# Patient Record
Sex: Female | Born: 1965 | Race: White | Hispanic: No | Marital: Single | Smoking: Never smoker
Health system: Southern US, Community
[De-identification: ages and names within clinical notes are randomized; demographics above are authoritative.]

## PROBLEM LIST (undated history)

## (undated) DIAGNOSIS — G4700 Insomnia, unspecified: Secondary | ICD-10-CM

## (undated) DIAGNOSIS — R8789 Other abnormal findings in specimens from female genital organs: Secondary | ICD-10-CM

## (undated) DIAGNOSIS — E119 Type 2 diabetes mellitus without complications: Secondary | ICD-10-CM

## (undated) DIAGNOSIS — E282 Polycystic ovarian syndrome: Secondary | ICD-10-CM

## (undated) DIAGNOSIS — R51 Headache: Secondary | ICD-10-CM

## (undated) DIAGNOSIS — R519 Headache, unspecified: Secondary | ICD-10-CM

## (undated) DIAGNOSIS — F32A Depression, unspecified: Secondary | ICD-10-CM

## (undated) DIAGNOSIS — E079 Disorder of thyroid, unspecified: Secondary | ICD-10-CM

## (undated) DIAGNOSIS — F419 Anxiety disorder, unspecified: Secondary | ICD-10-CM

## (undated) DIAGNOSIS — K589 Irritable bowel syndrome without diarrhea: Secondary | ICD-10-CM

## (undated) DIAGNOSIS — K9049 Malabsorption due to intolerance, not elsewhere classified: Secondary | ICD-10-CM

## (undated) DIAGNOSIS — J302 Other seasonal allergic rhinitis: Secondary | ICD-10-CM

## (undated) DIAGNOSIS — F329 Major depressive disorder, single episode, unspecified: Secondary | ICD-10-CM

## (undated) DIAGNOSIS — G43909 Migraine, unspecified, not intractable, without status migrainosus: Secondary | ICD-10-CM

## (undated) DIAGNOSIS — I10 Essential (primary) hypertension: Secondary | ICD-10-CM

## (undated) DIAGNOSIS — F4310 Post-traumatic stress disorder, unspecified: Secondary | ICD-10-CM

## (undated) HISTORY — DX: Essential (primary) hypertension: I10

## (undated) HISTORY — DX: Irritable bowel syndrome, unspecified: K58.9

## (undated) HISTORY — DX: Insomnia, unspecified: G47.00

## (undated) HISTORY — DX: Post-traumatic stress disorder, unspecified: F43.10

## (undated) HISTORY — DX: Other abnormal findings in specimens from female genital organs: R87.89

## (undated) HISTORY — DX: Depression, unspecified: F32.A

## (undated) HISTORY — DX: Major depressive disorder, single episode, unspecified: F32.9

## (undated) HISTORY — DX: Type 2 diabetes mellitus without complications: E11.9

## (undated) HISTORY — DX: Headache, unspecified: R51.9

## (undated) HISTORY — DX: Other seasonal allergic rhinitis: J30.2

## (undated) HISTORY — DX: Disorder of thyroid, unspecified: E07.9

## (undated) HISTORY — DX: Migraine, unspecified, not intractable, without status migrainosus: G43.909

## (undated) HISTORY — DX: Polycystic ovarian syndrome: E28.2

## (undated) HISTORY — DX: Malabsorption due to intolerance, not elsewhere classified: K90.49

## (undated) HISTORY — PX: GALLBLADDER SURGERY: SHX652

## (undated) HISTORY — DX: Anxiety disorder, unspecified: F41.9

## (undated) HISTORY — DX: Headache: R51

## (undated) HISTORY — PX: CHOLECYSTECTOMY: SHX55

---

## 2009-05-16 ENCOUNTER — Ambulatory Visit (HOSPITAL_COMMUNITY): Payer: Self-pay | Admitting: Psychiatry

## 2009-06-01 ENCOUNTER — Ambulatory Visit (HOSPITAL_COMMUNITY): Payer: Self-pay | Admitting: Licensed Clinical Social Worker

## 2009-06-08 ENCOUNTER — Ambulatory Visit (HOSPITAL_COMMUNITY): Payer: Self-pay | Admitting: Psychiatry

## 2009-06-16 ENCOUNTER — Ambulatory Visit (HOSPITAL_COMMUNITY): Payer: Self-pay | Admitting: Licensed Clinical Social Worker

## 2009-06-30 ENCOUNTER — Ambulatory Visit (HOSPITAL_COMMUNITY): Payer: Self-pay | Admitting: Licensed Clinical Social Worker

## 2009-07-06 ENCOUNTER — Ambulatory Visit (HOSPITAL_COMMUNITY): Payer: Self-pay | Admitting: Licensed Clinical Social Worker

## 2009-07-14 ENCOUNTER — Ambulatory Visit (HOSPITAL_COMMUNITY): Payer: Self-pay | Admitting: Licensed Clinical Social Worker

## 2009-07-27 ENCOUNTER — Ambulatory Visit (HOSPITAL_COMMUNITY): Payer: Self-pay | Admitting: Licensed Clinical Social Worker

## 2009-08-03 ENCOUNTER — Ambulatory Visit (HOSPITAL_COMMUNITY): Payer: Self-pay | Admitting: Psychiatry

## 2009-08-09 ENCOUNTER — Ambulatory Visit (HOSPITAL_COMMUNITY): Payer: Self-pay | Admitting: Licensed Clinical Social Worker

## 2009-08-24 ENCOUNTER — Ambulatory Visit (HOSPITAL_COMMUNITY): Payer: Self-pay | Admitting: Licensed Clinical Social Worker

## 2009-09-06 ENCOUNTER — Ambulatory Visit (HOSPITAL_COMMUNITY): Payer: Self-pay | Admitting: Licensed Clinical Social Worker

## 2009-09-20 ENCOUNTER — Ambulatory Visit (HOSPITAL_COMMUNITY): Payer: Self-pay | Admitting: Licensed Clinical Social Worker

## 2009-10-05 ENCOUNTER — Ambulatory Visit (HOSPITAL_COMMUNITY): Payer: Self-pay | Admitting: Licensed Clinical Social Worker

## 2009-10-11 ENCOUNTER — Ambulatory Visit (HOSPITAL_COMMUNITY): Payer: Self-pay | Admitting: Psychiatry

## 2009-11-11 ENCOUNTER — Ambulatory Visit (HOSPITAL_COMMUNITY): Payer: Self-pay | Admitting: Licensed Clinical Social Worker

## 2009-12-07 ENCOUNTER — Ambulatory Visit (HOSPITAL_COMMUNITY): Payer: Self-pay | Admitting: Licensed Clinical Social Worker

## 2009-12-26 ENCOUNTER — Ambulatory Visit (HOSPITAL_COMMUNITY): Payer: Self-pay | Admitting: Psychiatry

## 2010-01-05 ENCOUNTER — Ambulatory Visit (HOSPITAL_COMMUNITY): Payer: Self-pay | Admitting: Licensed Clinical Social Worker

## 2010-02-02 ENCOUNTER — Ambulatory Visit (HOSPITAL_COMMUNITY): Payer: Self-pay | Admitting: Licensed Clinical Social Worker

## 2010-02-20 ENCOUNTER — Ambulatory Visit (HOSPITAL_COMMUNITY): Payer: Self-pay | Admitting: Licensed Clinical Social Worker

## 2010-03-01 ENCOUNTER — Ambulatory Visit (HOSPITAL_COMMUNITY): Payer: Self-pay | Admitting: Licensed Clinical Social Worker

## 2010-03-08 ENCOUNTER — Ambulatory Visit (HOSPITAL_COMMUNITY): Payer: Self-pay | Admitting: Licensed Clinical Social Worker

## 2010-03-15 ENCOUNTER — Ambulatory Visit (HOSPITAL_COMMUNITY): Payer: Self-pay | Admitting: Licensed Clinical Social Worker

## 2010-03-22 ENCOUNTER — Ambulatory Visit (HOSPITAL_COMMUNITY): Payer: Self-pay | Admitting: Licensed Clinical Social Worker

## 2010-03-27 ENCOUNTER — Ambulatory Visit (HOSPITAL_COMMUNITY): Payer: Self-pay | Admitting: Psychiatry

## 2010-03-29 ENCOUNTER — Ambulatory Visit (HOSPITAL_COMMUNITY): Payer: Self-pay | Admitting: Licensed Clinical Social Worker

## 2010-04-05 ENCOUNTER — Ambulatory Visit (HOSPITAL_COMMUNITY): Payer: Self-pay | Admitting: Licensed Clinical Social Worker

## 2010-04-10 ENCOUNTER — Ambulatory Visit (HOSPITAL_COMMUNITY): Admission: RE | Admit: 2010-04-10 | Discharge: 2010-04-10 | Payer: Self-pay | Admitting: Gastroenterology

## 2010-04-12 ENCOUNTER — Ambulatory Visit (HOSPITAL_COMMUNITY): Payer: Self-pay | Admitting: Licensed Clinical Social Worker

## 2010-04-19 ENCOUNTER — Ambulatory Visit (HOSPITAL_COMMUNITY): Payer: Self-pay | Admitting: Licensed Clinical Social Worker

## 2010-04-26 ENCOUNTER — Ambulatory Visit (HOSPITAL_COMMUNITY): Payer: Self-pay | Admitting: Licensed Clinical Social Worker

## 2010-05-04 ENCOUNTER — Ambulatory Visit (HOSPITAL_COMMUNITY): Payer: Self-pay | Admitting: Licensed Clinical Social Worker

## 2010-05-10 ENCOUNTER — Ambulatory Visit (HOSPITAL_COMMUNITY): Payer: Self-pay | Admitting: Licensed Clinical Social Worker

## 2010-05-17 ENCOUNTER — Ambulatory Visit (HOSPITAL_COMMUNITY): Payer: Self-pay | Admitting: Licensed Clinical Social Worker

## 2010-05-24 ENCOUNTER — Ambulatory Visit (HOSPITAL_COMMUNITY): Payer: Self-pay | Admitting: Licensed Clinical Social Worker

## 2010-05-31 ENCOUNTER — Ambulatory Visit (HOSPITAL_COMMUNITY): Payer: Self-pay | Admitting: Licensed Clinical Social Worker

## 2010-06-07 ENCOUNTER — Ambulatory Visit (HOSPITAL_COMMUNITY): Payer: Self-pay | Admitting: Licensed Clinical Social Worker

## 2010-06-21 ENCOUNTER — Ambulatory Visit (HOSPITAL_COMMUNITY): Payer: Self-pay | Admitting: Licensed Clinical Social Worker

## 2010-06-27 ENCOUNTER — Ambulatory Visit (HOSPITAL_COMMUNITY): Admission: RE | Admit: 2010-06-27 | Discharge: 2010-06-27 | Payer: Self-pay | Admitting: Gastroenterology

## 2010-06-28 ENCOUNTER — Ambulatory Visit (HOSPITAL_COMMUNITY): Payer: Self-pay | Admitting: Licensed Clinical Social Worker

## 2010-07-03 ENCOUNTER — Ambulatory Visit (HOSPITAL_COMMUNITY): Payer: Self-pay | Admitting: Psychiatry

## 2010-07-05 ENCOUNTER — Ambulatory Visit (HOSPITAL_COMMUNITY): Payer: Self-pay | Admitting: Licensed Clinical Social Worker

## 2010-07-07 ENCOUNTER — Ambulatory Visit (HOSPITAL_COMMUNITY): Admission: RE | Admit: 2010-07-07 | Discharge: 2010-07-07 | Payer: Self-pay | Admitting: Gastroenterology

## 2010-07-12 ENCOUNTER — Ambulatory Visit (HOSPITAL_COMMUNITY): Payer: Self-pay | Admitting: Licensed Clinical Social Worker

## 2010-07-19 ENCOUNTER — Ambulatory Visit (HOSPITAL_COMMUNITY): Payer: Self-pay | Admitting: Licensed Clinical Social Worker

## 2010-07-26 ENCOUNTER — Ambulatory Visit (HOSPITAL_COMMUNITY): Payer: Self-pay | Admitting: Licensed Clinical Social Worker

## 2010-08-02 ENCOUNTER — Ambulatory Visit (HOSPITAL_COMMUNITY): Payer: Self-pay | Admitting: Licensed Clinical Social Worker

## 2010-08-09 ENCOUNTER — Ambulatory Visit (HOSPITAL_COMMUNITY): Payer: Self-pay | Admitting: Licensed Clinical Social Worker

## 2010-08-23 ENCOUNTER — Ambulatory Visit (HOSPITAL_COMMUNITY): Payer: Self-pay | Admitting: Licensed Clinical Social Worker

## 2010-08-28 ENCOUNTER — Ambulatory Visit (HOSPITAL_COMMUNITY): Payer: Self-pay | Admitting: Psychiatry

## 2010-08-30 ENCOUNTER — Encounter (INDEPENDENT_AMBULATORY_CARE_PROVIDER_SITE_OTHER): Payer: Self-pay | Admitting: General Surgery

## 2010-08-30 ENCOUNTER — Ambulatory Visit (HOSPITAL_COMMUNITY): Payer: Self-pay | Admitting: Licensed Clinical Social Worker

## 2010-08-30 ENCOUNTER — Ambulatory Visit (HOSPITAL_COMMUNITY)
Admission: RE | Admit: 2010-08-30 | Discharge: 2010-08-31 | Payer: Self-pay | Source: Home / Self Care | Attending: General Surgery | Admitting: General Surgery

## 2010-09-06 ENCOUNTER — Ambulatory Visit (HOSPITAL_COMMUNITY): Payer: Self-pay | Admitting: Licensed Clinical Social Worker

## 2010-09-13 ENCOUNTER — Ambulatory Visit (HOSPITAL_COMMUNITY): Payer: Self-pay | Admitting: Licensed Clinical Social Worker

## 2010-09-15 ENCOUNTER — Ambulatory Visit (HOSPITAL_COMMUNITY): Payer: Self-pay | Admitting: Psychiatry

## 2010-09-21 ENCOUNTER — Ambulatory Visit (HOSPITAL_COMMUNITY): Admit: 2010-09-21 | Payer: Self-pay | Admitting: Licensed Clinical Social Worker

## 2010-10-04 ENCOUNTER — Ambulatory Visit (HOSPITAL_COMMUNITY)
Admission: RE | Admit: 2010-10-04 | Discharge: 2010-10-04 | Payer: Self-pay | Source: Home / Self Care | Attending: Licensed Clinical Social Worker | Admitting: Licensed Clinical Social Worker

## 2010-10-20 ENCOUNTER — Encounter (HOSPITAL_COMMUNITY): Payer: Medicare Other | Admitting: Licensed Clinical Social Worker

## 2010-10-20 DIAGNOSIS — F329 Major depressive disorder, single episode, unspecified: Secondary | ICD-10-CM

## 2010-11-01 ENCOUNTER — Encounter (HOSPITAL_COMMUNITY): Payer: Self-pay | Admitting: Licensed Clinical Social Worker

## 2010-11-07 ENCOUNTER — Encounter (HOSPITAL_COMMUNITY): Payer: Medicare Other | Admitting: Licensed Clinical Social Worker

## 2010-11-07 DIAGNOSIS — F329 Major depressive disorder, single episode, unspecified: Secondary | ICD-10-CM

## 2010-11-22 ENCOUNTER — Encounter (HOSPITAL_COMMUNITY): Payer: Self-pay | Admitting: Licensed Clinical Social Worker

## 2010-11-28 LAB — COMPREHENSIVE METABOLIC PANEL
AST: 26 U/L (ref 0–37)
Albumin: 3.4 g/dL — ABNORMAL LOW (ref 3.5–5.2)
Calcium: 9.9 mg/dL (ref 8.4–10.5)
Creatinine, Ser: 0.64 mg/dL (ref 0.4–1.2)
GFR calc Af Amer: >60 mL/min (ref >60)
Sodium: 135 mEq/L (ref 135–145)

## 2010-11-28 LAB — DIFFERENTIAL
Eosinophils Relative: 6 % — ABNORMAL HIGH (ref 0–5)
Lymphocytes Relative: 27 % (ref 12–46)
Lymphs Abs: 3 10*3/uL (ref 0.7–4.0)
Monocytes Absolute: 0.5 10*3/uL (ref 0.1–1.0)
Monocytes Relative: 4 % (ref 3–12)

## 2010-11-28 LAB — CBC
MCH: 27.7 pg (ref 26.0–34.0)
MCHC: 31.4 g/dL (ref 30.0–36.0)
Platelets: 315 10*3/uL (ref 150–400)
RBC: 4.22 MIL/uL (ref 3.87–5.11)

## 2010-12-03 ENCOUNTER — Emergency Department (HOSPITAL_COMMUNITY): Payer: Medicare Other

## 2010-12-03 ENCOUNTER — Emergency Department (HOSPITAL_COMMUNITY)
Admission: EM | Admit: 2010-12-03 | Discharge: 2010-12-03 | Disposition: A | Discharge status: Home / Self Care | Payer: Medicare Other | Attending: Emergency Medicine | Admitting: Emergency Medicine

## 2010-12-03 DIAGNOSIS — R071 Chest pain on breathing: Secondary | ICD-10-CM | POA: Insufficient documentation

## 2010-12-03 DIAGNOSIS — F4310 Post-traumatic stress disorder, unspecified: Secondary | ICD-10-CM | POA: Insufficient documentation

## 2010-12-03 DIAGNOSIS — I10 Essential (primary) hypertension: Secondary | ICD-10-CM | POA: Insufficient documentation

## 2010-12-03 DIAGNOSIS — E119 Type 2 diabetes mellitus without complications: Secondary | ICD-10-CM | POA: Insufficient documentation

## 2010-12-03 DIAGNOSIS — R0609 Other forms of dyspnea: Secondary | ICD-10-CM | POA: Insufficient documentation

## 2010-12-03 DIAGNOSIS — R0602 Shortness of breath: Secondary | ICD-10-CM | POA: Insufficient documentation

## 2010-12-03 DIAGNOSIS — R0989 Other specified symptoms and signs involving the circulatory and respiratory systems: Secondary | ICD-10-CM | POA: Insufficient documentation

## 2010-12-03 DIAGNOSIS — R110 Nausea: Secondary | ICD-10-CM | POA: Insufficient documentation

## 2010-12-03 LAB — DIFFERENTIAL
Eosinophils Absolute: 0.7 10*3/uL (ref 0.0–0.7)
Lymphocytes Relative: 31 % (ref 12–46)
Lymphs Abs: 3.4 10*3/uL (ref 0.7–4.0)
Neutro Abs: 6.5 10*3/uL (ref 1.7–7.7)
Neutrophils Relative %: 58 % (ref 43–77)

## 2010-12-03 LAB — GLUCOSE, CAPILLARY: Glucose-Capillary: 180 mg/dL — ABNORMAL HIGH (ref 70–99)

## 2010-12-03 LAB — URINALYSIS, ROUTINE W REFLEX MICROSCOPIC
Nitrite: NEGATIVE
Specific Gravity, Urine: 1.006 (ref 1.005–1.030)
pH: 7 (ref 5.0–8.0)

## 2010-12-03 LAB — LIPASE, BLOOD: Lipase: 29 U/L (ref 11–59)

## 2010-12-03 LAB — URINE MICROSCOPIC-ADD ON

## 2010-12-03 LAB — CBC
MCV: 88.9 fL (ref 78.0–100.0)
Platelets: 295 10*3/uL (ref 150–400)
RBC: 4.07 MIL/uL (ref 3.87–5.11)
WBC: 11.2 10*3/uL — ABNORMAL HIGH (ref 4.0–10.5)

## 2010-12-03 LAB — COMPREHENSIVE METABOLIC PANEL
ALT: 41 U/L — ABNORMAL HIGH (ref 0–35)
Albumin: 3.3 g/dL — ABNORMAL LOW (ref 3.5–5.2)
Alkaline Phosphatase: 77 U/L (ref 39–117)
Glucose, Bld: 124 mg/dL — ABNORMAL HIGH (ref 70–99)
Potassium: 3.9 mEq/L (ref 3.5–5.1)
Sodium: 137 mEq/L (ref 135–145)
Total Protein: 7 g/dL (ref 6.0–8.3)

## 2010-12-03 LAB — CARDIAC PANEL(CRET KIN+CKTOT+MB+TROPI): Troponin I: <0.01 ng/mL (ref 0.00–0.06)

## 2010-12-06 ENCOUNTER — Encounter (HOSPITAL_COMMUNITY): Payer: Medicare Other | Admitting: Licensed Clinical Social Worker

## 2010-12-06 DIAGNOSIS — F329 Major depressive disorder, single episode, unspecified: Secondary | ICD-10-CM

## 2010-12-21 ENCOUNTER — Encounter (HOSPITAL_BASED_OUTPATIENT_CLINIC_OR_DEPARTMENT_OTHER): Payer: Medicare Other | Admitting: Licensed Clinical Social Worker

## 2010-12-21 DIAGNOSIS — F329 Major depressive disorder, single episode, unspecified: Secondary | ICD-10-CM

## 2010-12-28 ENCOUNTER — Encounter (HOSPITAL_BASED_OUTPATIENT_CLINIC_OR_DEPARTMENT_OTHER): Payer: Medicare Other | Admitting: Licensed Clinical Social Worker

## 2010-12-28 DIAGNOSIS — F329 Major depressive disorder, single episode, unspecified: Secondary | ICD-10-CM

## 2011-01-04 ENCOUNTER — Encounter (HOSPITAL_BASED_OUTPATIENT_CLINIC_OR_DEPARTMENT_OTHER): Payer: Medicare Other | Admitting: Licensed Clinical Social Worker

## 2011-01-04 DIAGNOSIS — F329 Major depressive disorder, single episode, unspecified: Secondary | ICD-10-CM

## 2011-01-04 DIAGNOSIS — F3289 Other specified depressive episodes: Secondary | ICD-10-CM

## 2011-01-11 ENCOUNTER — Encounter (HOSPITAL_COMMUNITY): Payer: Medicare Other | Admitting: Psychiatry

## 2011-01-11 ENCOUNTER — Encounter (HOSPITAL_BASED_OUTPATIENT_CLINIC_OR_DEPARTMENT_OTHER): Payer: Medicare Other | Admitting: Licensed Clinical Social Worker

## 2011-01-11 DIAGNOSIS — F3289 Other specified depressive episodes: Secondary | ICD-10-CM

## 2011-01-11 DIAGNOSIS — F329 Major depressive disorder, single episode, unspecified: Secondary | ICD-10-CM

## 2011-01-18 ENCOUNTER — Encounter (HOSPITAL_BASED_OUTPATIENT_CLINIC_OR_DEPARTMENT_OTHER): Payer: Medicare Other | Admitting: Licensed Clinical Social Worker

## 2011-01-18 DIAGNOSIS — F329 Major depressive disorder, single episode, unspecified: Secondary | ICD-10-CM

## 2011-01-25 ENCOUNTER — Encounter (HOSPITAL_COMMUNITY): Payer: Medicare Other | Admitting: Licensed Clinical Social Worker
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
## 2011-02-01 ENCOUNTER — Encounter (HOSPITAL_BASED_OUTPATIENT_CLINIC_OR_DEPARTMENT_OTHER): Payer: Medicare Other | Admitting: Licensed Clinical Social Worker

## 2011-02-01 DIAGNOSIS — F329 Major depressive disorder, single episode, unspecified: Secondary | ICD-10-CM

## 2011-02-08 ENCOUNTER — Encounter (HOSPITAL_COMMUNITY): Payer: Medicare Other | Admitting: Licensed Clinical Social Worker

## 2011-02-08 ENCOUNTER — Encounter (HOSPITAL_BASED_OUTPATIENT_CLINIC_OR_DEPARTMENT_OTHER): Payer: Medicare Other | Admitting: Licensed Clinical Social Worker

## 2011-02-08 DIAGNOSIS — F329 Major depressive disorder, single episode, unspecified: Secondary | ICD-10-CM

## 2011-02-15 ENCOUNTER — Encounter (HOSPITAL_BASED_OUTPATIENT_CLINIC_OR_DEPARTMENT_OTHER): Payer: Medicare Other | Admitting: Licensed Clinical Social Worker

## 2011-02-15 DIAGNOSIS — F329 Major depressive disorder, single episode, unspecified: Secondary | ICD-10-CM

## 2011-02-22 ENCOUNTER — Encounter (HOSPITAL_BASED_OUTPATIENT_CLINIC_OR_DEPARTMENT_OTHER): Payer: Medicare Other | Admitting: Licensed Clinical Social Worker

## 2011-02-22 DIAGNOSIS — F329 Major depressive disorder, single episode, unspecified: Secondary | ICD-10-CM

## 2011-03-01 ENCOUNTER — Encounter (HOSPITAL_BASED_OUTPATIENT_CLINIC_OR_DEPARTMENT_OTHER): Payer: Medicare Other | Admitting: Licensed Clinical Social Worker

## 2011-03-01 DIAGNOSIS — F329 Major depressive disorder, single episode, unspecified: Secondary | ICD-10-CM

## 2011-03-08 ENCOUNTER — Encounter (HOSPITAL_BASED_OUTPATIENT_CLINIC_OR_DEPARTMENT_OTHER): Payer: Medicare Other | Admitting: Licensed Clinical Social Worker

## 2011-03-08 DIAGNOSIS — F329 Major depressive disorder, single episode, unspecified: Secondary | ICD-10-CM

## 2011-03-15 ENCOUNTER — Encounter (HOSPITAL_BASED_OUTPATIENT_CLINIC_OR_DEPARTMENT_OTHER): Payer: Medicare Other | Admitting: Licensed Clinical Social Worker

## 2011-03-15 DIAGNOSIS — F329 Major depressive disorder, single episode, unspecified: Secondary | ICD-10-CM

## 2011-03-22 ENCOUNTER — Encounter (HOSPITAL_COMMUNITY): Payer: Medicare Other | Admitting: Licensed Clinical Social Worker

## 2011-03-29 ENCOUNTER — Encounter (HOSPITAL_BASED_OUTPATIENT_CLINIC_OR_DEPARTMENT_OTHER): Payer: Medicare Other | Admitting: Licensed Clinical Social Worker

## 2011-03-29 DIAGNOSIS — F988 Other specified behavioral and emotional disorders with onset usually occurring in childhood and adolescence: Secondary | ICD-10-CM

## 2011-04-05 ENCOUNTER — Encounter (HOSPITAL_BASED_OUTPATIENT_CLINIC_OR_DEPARTMENT_OTHER): Payer: Medicare Other | Admitting: Licensed Clinical Social Worker

## 2011-04-05 DIAGNOSIS — F329 Major depressive disorder, single episode, unspecified: Secondary | ICD-10-CM

## 2011-04-10 ENCOUNTER — Encounter (INDEPENDENT_AMBULATORY_CARE_PROVIDER_SITE_OTHER): Payer: Medicare Other | Admitting: Psychiatry

## 2011-04-10 DIAGNOSIS — F339 Major depressive disorder, recurrent, unspecified: Secondary | ICD-10-CM

## 2011-04-10 DIAGNOSIS — F411 Generalized anxiety disorder: Secondary | ICD-10-CM

## 2011-04-12 ENCOUNTER — Encounter (HOSPITAL_BASED_OUTPATIENT_CLINIC_OR_DEPARTMENT_OTHER): Payer: Medicare Other | Admitting: Licensed Clinical Social Worker

## 2011-04-12 DIAGNOSIS — F329 Major depressive disorder, single episode, unspecified: Secondary | ICD-10-CM

## 2011-04-23 ENCOUNTER — Encounter (HOSPITAL_BASED_OUTPATIENT_CLINIC_OR_DEPARTMENT_OTHER): Payer: Medicare Other | Admitting: Licensed Clinical Social Worker

## 2011-04-23 DIAGNOSIS — F329 Major depressive disorder, single episode, unspecified: Secondary | ICD-10-CM

## 2011-05-03 ENCOUNTER — Encounter (HOSPITAL_BASED_OUTPATIENT_CLINIC_OR_DEPARTMENT_OTHER): Payer: Medicare Other | Admitting: Licensed Clinical Social Worker

## 2011-05-03 DIAGNOSIS — F329 Major depressive disorder, single episode, unspecified: Secondary | ICD-10-CM

## 2011-05-10 ENCOUNTER — Encounter (INDEPENDENT_AMBULATORY_CARE_PROVIDER_SITE_OTHER): Payer: Medicare Other | Admitting: Licensed Clinical Social Worker

## 2011-05-10 DIAGNOSIS — F329 Major depressive disorder, single episode, unspecified: Secondary | ICD-10-CM

## 2011-05-17 ENCOUNTER — Encounter (INDEPENDENT_AMBULATORY_CARE_PROVIDER_SITE_OTHER): Payer: Medicare Other | Admitting: Licensed Clinical Social Worker

## 2011-05-17 DIAGNOSIS — F332 Major depressive disorder, recurrent severe without psychotic features: Secondary | ICD-10-CM

## 2011-05-24 ENCOUNTER — Encounter (INDEPENDENT_AMBULATORY_CARE_PROVIDER_SITE_OTHER): Payer: Medicare Other | Admitting: Licensed Clinical Social Worker

## 2011-05-24 DIAGNOSIS — F332 Major depressive disorder, recurrent severe without psychotic features: Secondary | ICD-10-CM

## 2011-05-31 ENCOUNTER — Encounter (INDEPENDENT_AMBULATORY_CARE_PROVIDER_SITE_OTHER): Payer: Medicare Other | Admitting: Licensed Clinical Social Worker

## 2011-05-31 DIAGNOSIS — F332 Major depressive disorder, recurrent severe without psychotic features: Secondary | ICD-10-CM

## 2011-06-14 ENCOUNTER — Encounter (HOSPITAL_COMMUNITY): Payer: Medicare Other | Admitting: Licensed Clinical Social Worker

## 2011-06-21 ENCOUNTER — Encounter (INDEPENDENT_AMBULATORY_CARE_PROVIDER_SITE_OTHER): Payer: Medicare Other | Admitting: Licensed Clinical Social Worker

## 2011-06-21 DIAGNOSIS — F332 Major depressive disorder, recurrent severe without psychotic features: Secondary | ICD-10-CM

## 2011-07-05 ENCOUNTER — Encounter (INDEPENDENT_AMBULATORY_CARE_PROVIDER_SITE_OTHER): Payer: Medicare Other | Admitting: Licensed Clinical Social Worker

## 2011-07-05 DIAGNOSIS — F4310 Post-traumatic stress disorder, unspecified: Secondary | ICD-10-CM

## 2011-07-12 ENCOUNTER — Encounter (HOSPITAL_COMMUNITY): Payer: Medicare Other | Admitting: Licensed Clinical Social Worker

## 2011-07-19 ENCOUNTER — Encounter (INDEPENDENT_AMBULATORY_CARE_PROVIDER_SITE_OTHER): Payer: Medicare Other | Admitting: Licensed Clinical Social Worker

## 2011-07-19 DIAGNOSIS — F332 Major depressive disorder, recurrent severe without psychotic features: Secondary | ICD-10-CM

## 2011-07-20 ENCOUNTER — Encounter (INDEPENDENT_AMBULATORY_CARE_PROVIDER_SITE_OTHER): Payer: Medicare Other | Admitting: Psychiatry

## 2011-07-20 DIAGNOSIS — F339 Major depressive disorder, recurrent, unspecified: Secondary | ICD-10-CM

## 2011-07-23 ENCOUNTER — Telehealth (HOSPITAL_COMMUNITY): Payer: Self-pay | Admitting: Psychiatry

## 2011-07-23 NOTE — Telephone Encounter (Signed)
The patient was called at her home number to notify her that her Effexor from the drug company arrived. A message was left.

## 2011-07-27 ENCOUNTER — Encounter (HOSPITAL_COMMUNITY): Payer: Self-pay | Admitting: Licensed Clinical Social Worker

## 2011-07-27 ENCOUNTER — Ambulatory Visit (INDEPENDENT_AMBULATORY_CARE_PROVIDER_SITE_OTHER): Payer: Medicare Other | Admitting: Licensed Clinical Social Worker

## 2011-07-27 DIAGNOSIS — F332 Major depressive disorder, recurrent severe without psychotic features: Secondary | ICD-10-CM

## 2011-07-27 NOTE — Progress Notes (Signed)
   THERAPIST PROGRESS NOTE  Session Time: 1:00pm-1:50pm  Participation Level: Active  Behavioral Response: Well GroomedAlertDepressed  Type of Therapy: Individual Therapy  Treatment Goals addressed: Anxiety, Coping and Diagnosis: depression  Interventions: CBT, DBT, Motivational Interviewing, Strength-based, Supportive and Reframing  Summary: [name] is a 45 y.o. female who presents with depressed mood and affect. She appears fatigued and endorses feeling exhausted due to high stress levels related to school. She discusses her anatomy exam this week and her disappointment that she did not do as well as she expected. She is pleased that she was less anxious going into the exam than she has been. She reports an increase in binge eating to manage her stress and reports eating 12 min-cupcakes at one time and half a pan of brownies in one sitting. She expresses concern over her "regression" of using food to self soothe. Discussed increasing her mindfulness when she purchases food to eat, prior to eating and while she is eating. Used DBT to build distress tolerance. Her sleep has been interrupted by stress, but has not become a pattern yet. Patient is now taking Zoloft and gradually titrating down on Effexor. She is calm and clear when discussing this plan and does not demonstrate anxiety related to changing medication. She continues to adapt and develop flexibility.   Suicidal/Homicidal: Nowithout intent/plan  Therapist Response: Assisted patient with the expression of her feelings of depression, anxiety and frustration. She demonstrates some negative self talk today, but less than she has in the past. the negative self talk surrounds her inability to find someone to date. She continues to show good progress in her ability to tolerate stress and is currently experiencing natural fatigue related to school.  Used CBT to assist patient with the identification of her negative distortions and  irrational thoughts. Encouraged patient to verbalize alternative and factual responses which challenge her distortions. Reviewed patients self care plan. Assessed her progress related to self care. Patient's self care is very good, except for binge eating. Recommend proper diet, regular exercise, socialization and recreation. Used motivational interviewing to assist and encourage patient through the change process related to binge eating. Explored patients barriers to change. Patient denies suicidal and homicidal ideation, intent or plan.  Plan: Return again in 1 weeks.  Diagnosis: Axis I: Major Depression, Rec    Axis II: No diagnosis    Chara Marquard, [name] 07/27/2011

## 2011-08-02 ENCOUNTER — Encounter (HOSPITAL_COMMUNITY): Payer: Self-pay | Admitting: Licensed Clinical Social Worker

## 2011-08-02 ENCOUNTER — Ambulatory Visit (INDEPENDENT_AMBULATORY_CARE_PROVIDER_SITE_OTHER): Payer: Medicare Other | Admitting: Licensed Clinical Social Worker

## 2011-08-02 DIAGNOSIS — F332 Major depressive disorder, recurrent severe without psychotic features: Secondary | ICD-10-CM

## 2011-08-02 NOTE — Progress Notes (Signed)
   THERAPIST PROGRESS NOTE  Session Time: 1:00pm-1:50pm  Participation Level: Active  Behavioral Response: Well GroomedAlertAnxious and Euthymic  Type of Therapy: Individual Therapy  Treatment Goals addressed: Anxiety, Coping and Diagnosis: depression  Interventions: Motivational Interviewing, Solution Focused, Strength-based and Supportive  Summary: [name] is a 45 y.o. female who presents with euthymic mood and affect. She reports doing well over the past week, with some anxiety related to school. She is pleased to report that she has been more mindful while eating in order to avoid food binges. She discusses recent exams and her growing confidence in anatomy lab. She is looking forward to Thanksgiving break, especially since a friend has invited he to spend the holiday with her. Patient is focused on self care and rest next week. 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 Overall, patient is doing well, managing her stress well and enjoying life. Her sleep and appetite are wnl.  Suicidal/Homicidal: Nowithout intent/plan  Therapist Response: Patient continues to demonstrate progress related to managing her stress levels with improved distress tolerance skills. She remains focused on taking good care of herself, but finds that she is less motivated to exercise in colder weather. She does not demonstrate anxiety about seeing a different therapist while this therapist is on leave. She will follow up with Shonna Chock and is able to describe a clear crisis plan which includes contacting Dr. Christell Constant, her friends and Clerance Lav if she feels she is at risk to harm herself. Patient denies suicidal and homicidal ideation, intent or plan.  Plan: Return again in 1 weeks.  Diagnosis: Axis I: Major Depression, Recurrent severe    Axis II: No diagnosis    Isis Costanza, [name] 08/02/2011

## 2011-08-17 ENCOUNTER — Ambulatory Visit (HOSPITAL_COMMUNITY): Payer: Medicare Other | Admitting: Psychology

## 2011-08-30 ENCOUNTER — Ambulatory Visit (INDEPENDENT_AMBULATORY_CARE_PROVIDER_SITE_OTHER): Payer: Medicare Other | Admitting: Psychology

## 2011-08-30 DIAGNOSIS — F4310 Post-traumatic stress disorder, unspecified: Secondary | ICD-10-CM

## 2011-08-30 NOTE — Progress Notes (Signed)
   THERAPIST PROGRESS NOTE  Session Time: 1030 - 1130   Participation Level: Active  Behavioral Response: Well GroomedAlertEuthymic  Type of Therapy: Individual Therapy  Treatment Goals addressed: Coping  Interventions: Supportive  Summary: [name] is a 45 y.o. female who presents with report of  Doing better than she had expected at this point.  She is a patient of another counselor who I am seeing while her counselor is on sick leave.  She has completed the semester at [employer] successfully, with good grades.  She has been accepted for a housing voucher program that will help with her rent.  This will mean she needs to move, and she has found a suitable new place and has time to move over the school holidays. She has continued to have regular contact with friends and even has an invitation for Christmas day.  Overall, she just needed to come in and touch base.  We reviewed her coping skills, talked about the positives that have happened, and she remains in a very positive mood.  Affect is bright, thinking is logical and goal-directed, mood is euthymic and she denies any hints of SI or HI.  She is in the process of a cross taper from Effexor to Zoloft and is doing well with that so far. Supportive therapy provided, recognition of her accomplishments and she will be able to see her regular therapist next month.  Suicidal/Homicidal: Nowithout intent/plan  Therapist Response: NA  Plan: Return again in 4 weeks.  Diagnosis: Axis I: Post Traumatic Stress Disorder    Axis II: Deferred    Laiza Veenstra, RN 08/30/2011

## 2011-09-06 ENCOUNTER — Other Ambulatory Visit (HOSPITAL_COMMUNITY): Payer: Self-pay | Admitting: Psychiatry

## 2011-09-06 ENCOUNTER — Ambulatory Visit (HOSPITAL_COMMUNITY): Payer: Medicare Other | Admitting: Psychology

## 2011-09-06 DIAGNOSIS — F411 Generalized anxiety disorder: Secondary | ICD-10-CM

## 2011-09-13 ENCOUNTER — Other Ambulatory Visit (HOSPITAL_COMMUNITY): Payer: Self-pay | Admitting: Psychiatry

## 2011-09-13 MED ORDER — ZOLPIDEM TARTRATE ER 12.5 MG PO TBCR: 12.5000 mg | EXTENDED_RELEASE_TABLET | Freq: Every evening | ORAL | Status: DC | PRN

## 2011-09-20 ENCOUNTER — Ambulatory Visit (HOSPITAL_COMMUNITY): Payer: Medicare Other | Admitting: Licensed Clinical Social Worker

## 2011-09-27 ENCOUNTER — Ambulatory Visit (HOSPITAL_COMMUNITY): Payer: Medicare Other | Admitting: Licensed Clinical Social Worker

## 2011-09-28 ENCOUNTER — Encounter (HOSPITAL_COMMUNITY): Payer: Medicare Other | Admitting: Psychiatry

## 2011-10-04 ENCOUNTER — Encounter (HOSPITAL_COMMUNITY): Payer: Self-pay | Admitting: Licensed Clinical Social Worker

## 2011-10-04 ENCOUNTER — Encounter (HOSPITAL_COMMUNITY): Payer: Self-pay

## 2011-10-04 ENCOUNTER — Ambulatory Visit (INDEPENDENT_AMBULATORY_CARE_PROVIDER_SITE_OTHER): Payer: Medicare Other | Admitting: Licensed Clinical Social Worker

## 2011-10-04 DIAGNOSIS — F4310 Post-traumatic stress disorder, unspecified: Secondary | ICD-10-CM

## 2011-10-04 DIAGNOSIS — F332 Major depressive disorder, recurrent severe without psychotic features: Secondary | ICD-10-CM

## 2011-10-04 NOTE — Progress Notes (Signed)
   THERAPIST PROGRESS NOTE  Session Time: 2pm-2:50pm  Participation Level: Active  Behavioral Response: Well GroomedAlertEuthymic  Type of Therapy: Individual Therapy  Treatment Goals addressed: Coping  Interventions: CBT, Strength-based and Supportive  Summary: [name] is a 46 y.o. female who presents with euthymic mood and bright affect. This is her first session with this therapist in two months. She was seen by Shonna Chock once. She reports doing very well, with well controlled depression, anxiety and PTSD symptoms. She moved over school break and managed that stressor well. She endorses feeling more connected, with decreased negative thoughts and improved self esteem. She is surprised how well things are going for her regarding school, socialization, meeting new people in a LGBT group, financially and physically. She does appear to have gained weight and she endorses this as a result of going off Metformin. Her sleep and appetite are wnl.    Suicidal/Homicidal: Nowith intent/plan  Therapist Response: Patient has made dramatic improvements in the past month. She is responding well to Zoloft and endorses improvement in her anxiety and PTSD symptoms as a result. She is socializing well, using her positive coping skills and using CBT techniques. Reviewed and celebrated patients success. She wants to return to weekly sessions because she has just started the new semester and wants to be proactive to manage her stress. Reviewed patients self care plan. Assessed her progress related to self care. Patient's self care is good. Recommend proper diet, regular exercise, socialization and recreation.   Plan: Return again in one weeks.  Diagnosis: Axis I: Major Depression, Recurrent severe and Post Traumatic Stress Disorder    Axis II: Deferred    Datra Clary, [name] 10/04/2011

## 2011-10-11 ENCOUNTER — Ambulatory Visit (INDEPENDENT_AMBULATORY_CARE_PROVIDER_SITE_OTHER): Payer: Medicare Other | Admitting: Licensed Clinical Social Worker

## 2011-10-11 ENCOUNTER — Encounter (HOSPITAL_COMMUNITY): Payer: Self-pay | Admitting: Licensed Clinical Social Worker

## 2011-10-11 DIAGNOSIS — F332 Major depressive disorder, recurrent severe without psychotic features: Secondary | ICD-10-CM

## 2011-10-11 NOTE — Progress Notes (Signed)
   THERAPIST PROGRESS NOTE  Session Time: 2:00pm-2:30pm  Participation Level: Active  Behavioral Response: Well GroomedAlertEuthymic  Type of Therapy: Individual Therapy  Treatment Goals addressed: Coping  Interventions: Solution Focused and Strength-based  Summary: [name] is a 46 y.o. female who presents with euthymic mood and bright affect. She reports continued and sustained mood stability, well controlled anxiety and overall happiness with her life. She has decided to decrease the frequncy of her sessions due to her sustained improvement. her main stressor at this time is back pain and she leaves the session early to attend an acupuncture appt to address her pain. She is doing well in her classes and enjoys them. She does have some financial stress, but it well managed. Her sleep and appetite are wnl and she is taking good care of herself.    Suicidal/Homicidal: Nowithout intent/plan  Therapist Response: Patient continues to do well and will attend therapy once every two weeks, with the goal of decreasing treatment even further. Her thinking is clear and not distorted. Reviewed patients self care plan. Assessed her progress related to self care. Patient's self care is goo. Recommend proper diet, regular exercise, socialization and recreation. Reviewed strategies to continue to manage anxiety, depression and stress. Continue to focus on CBT and DBT skills.   Plan: Return again in two weeks.  Diagnosis: Axis I: Major Depression, Recurrent severe    Axis II: Deferred    Abbe Bula, [name] 10/11/2011

## 2011-10-12 ENCOUNTER — Ambulatory Visit (HOSPITAL_COMMUNITY): Payer: Medicare Other | Admitting: Psychiatry

## 2011-10-18 ENCOUNTER — Ambulatory Visit (HOSPITAL_COMMUNITY): Payer: Medicare Other | Admitting: Licensed Clinical Social Worker

## 2011-10-19 ENCOUNTER — Ambulatory Visit (HOSPITAL_COMMUNITY): Payer: Medicare Other | Admitting: Psychiatry

## 2011-10-25 ENCOUNTER — Encounter (HOSPITAL_COMMUNITY): Payer: Self-pay | Admitting: Licensed Clinical Social Worker

## 2011-10-25 ENCOUNTER — Ambulatory Visit (INDEPENDENT_AMBULATORY_CARE_PROVIDER_SITE_OTHER): Payer: Medicare Other | Admitting: Licensed Clinical Social Worker

## 2011-10-25 DIAGNOSIS — F332 Major depressive disorder, recurrent severe without psychotic features: Secondary | ICD-10-CM | POA: Diagnosis not present

## 2011-10-25 NOTE — Progress Notes (Signed)
   THERAPIST PROGRESS NOTE  Session Time: 1:00pm-1:50pm  Participation Level: Active  Behavioral Response: Well GroomedAlertAnxious  Type of Therapy: Individual Therapy  Treatment Goals addressed: Anxiety and Coping  Interventions: DBT, Solution Focused and Supportive  Summary: [name] is a 46 y.o. female who presents with anxious mood and fatigued affect. She reports increased stress related to school, but enjoys her classes. She is spending a large amount of time on campus due to study groups and tutoring. She is glad she is doing this, but is taking a toll on her. She is experiencing increased blood pressure, unstable blood sugars and thyroid changes. She is working with her doctors to tweek her medications. She is upset over an interaction with a lab mate and describes her anger about this persons lack of seriousness during lab experiments. She is upset after learning that her brother had another child and that she is not part of their lives. She realizes that her low blood sugar contriuted to this argument. Her sleep and appetite are wnl.   Suicidal/Homicidal: Nowithout intent/plan  Therapist Response: Patients stress level is higher today and she admits she is under higher stress than usual. Processed w/patient her reaction to her stress and discussed ways in which she can manage her stress.  Used CBT to assist patient with the identification of her negative distortions and irrational thoughts. Encouraged patient to verbalize alternative and factual responses which challenge her distortions. Reviewed patients self care plan. Assessed her progress related to self care. Patient's self care is good. Recommend proper diet, regular exercise, socialization and recreation.   Plan: Return again in two weeks.  Diagnosis: Axis I: Major Depression, Recurrent severe    Axis II: No diagnosis    Hal Norrington, [name] 10/25/2011

## 2011-10-29 ENCOUNTER — Ambulatory Visit (HOSPITAL_COMMUNITY): Payer: Self-pay | Admitting: Psychiatry

## 2011-11-01 ENCOUNTER — Ambulatory Visit (HOSPITAL_COMMUNITY): Payer: Medicare Other | Admitting: Licensed Clinical Social Worker

## 2011-11-01 DIAGNOSIS — F411 Generalized anxiety disorder: Secondary | ICD-10-CM | POA: Diagnosis not present

## 2011-11-01 DIAGNOSIS — I10 Essential (primary) hypertension: Secondary | ICD-10-CM | POA: Diagnosis not present

## 2011-11-01 DIAGNOSIS — Z79899 Other long term (current) drug therapy: Secondary | ICD-10-CM | POA: Diagnosis not present

## 2011-11-02 ENCOUNTER — Encounter (HOSPITAL_COMMUNITY): Payer: Self-pay | Admitting: Psychiatry

## 2011-11-02 ENCOUNTER — Ambulatory Visit (INDEPENDENT_AMBULATORY_CARE_PROVIDER_SITE_OTHER): Payer: Medicare Other | Admitting: Psychiatry

## 2011-11-02 VITALS — BP 120/78 | Ht 62.0 in | Wt 176.0 lb

## 2011-11-02 DIAGNOSIS — F4310 Post-traumatic stress disorder, unspecified: Secondary | ICD-10-CM | POA: Insufficient documentation

## 2011-11-02 DIAGNOSIS — F411 Generalized anxiety disorder: Secondary | ICD-10-CM

## 2011-11-02 MED ORDER — SERTRALINE HCL 100 MG PO TABS: 100.0000 mg | ORAL_TABLET | Freq: Every day | ORAL | Status: DC

## 2011-11-02 MED ORDER — ZOLPIDEM TARTRATE ER 12.5 MG PO TBCR: 12.5000 mg | EXTENDED_RELEASE_TABLET | Freq: Every evening | ORAL | Status: DC | PRN

## 2011-11-02 MED ORDER — CLONAZEPAM 0.5 MG PO TABS: 0.2500 mg | ORAL_TABLET | Freq: Every evening | ORAL | Status: DC | PRN

## 2011-11-02 NOTE — Progress Notes (Signed)
   [facility] Follow-up Outpatient Visit  [name] 07-Jan-1966   Subjective: The patient is a 46 year old female who has been followed by [facility] since November of 2010. She is currently diagnosed with PTSD. At last appointment I cross taper her from Effexor XR to Zoloft. Patient did extremely well with a cross taper. She is currently on 100 mg of Zoloft daily. She is completely off the Effexor XR. She also continues to take her quarter Klonopin at bedtime along with Ambien CR 12.5 mg. The patient continues with school. She is doing very well. She got a D. in anatomy last semester. She was very proud of this. She is taking 2 classes this semester. They're going well. The patient is getting out and being social. She endorses good sleep and appetite. She has had some strange dreams. She moved approximately one month ago. She just getting settled. The patient has no other complaints today. She continues to see her therapist. They have moved appointments every other week because she's doing so well.  Filed Vitals:   11/02/11 1435  BP: 120/78    Mental Status Examination  Appearance: Casual Alert: Yes Attention: good  Cooperative: Yes Eye Contact: Good Speech: Regular rate rhythm and volume Psychomotor Activity: Normal Memory/Concentration: Intact Oriented: person, place, time/date and situation Mood: Euthymic Affect: Congruent Thought Processes and Associations: Logical Fund of Knowledge: Fair Thought Content: No suicidal or homicidal thought Insight: Fair Judgement: Fair  Diagnosis: PTSD  Treatment Plan: We will not make any changes. I have refilled all her medications. I will see her back in 3 months. Patient to call with concerns.  Jamse Mead, MD

## 2011-11-08 ENCOUNTER — Ambulatory Visit (INDEPENDENT_AMBULATORY_CARE_PROVIDER_SITE_OTHER): Payer: Medicare Other | Admitting: Licensed Clinical Social Worker

## 2011-11-08 DIAGNOSIS — F4310 Post-traumatic stress disorder, unspecified: Secondary | ICD-10-CM

## 2011-11-08 DIAGNOSIS — F332 Major depressive disorder, recurrent severe without psychotic features: Secondary | ICD-10-CM

## 2011-11-08 NOTE — Progress Notes (Signed)
   THERAPIST PROGRESS NOTE  Session Time: 1:00pm-1:50pm  Participation Level: Active  Behavioral Response: Well GroomedAlertEuthymic  Type of Therapy: Individual Therapy  Treatment Goals addressed: Coping  Interventions: CBT, Strength-based and Supportive  Summary: [name] is a 46 y.o. female who presents with euthymic mood and affect. 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 She is unsure why her symptoms have flared up and hopes it was bad chicken she ate. She is doing well in school and enjoys her classes. She feels badly about encouraging her study group towards her answer, which turned out to be wrong. She feels responsible and wonders if she should apologize to them. She has difficulty accepting that she is not responsible for their grades and reminds herself in session of this so she will remember. Her sleep and appetite are wnl.    Suicidal/Homicidal: Nowithout intent/plan  Therapist Response: Processed w/pt her feelings of responsibility to her peers. 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 Provided feedback and redirection, confirming to patient her own responsibilities. Reviewed healthy boundaries and strategies to maintain them. Used CBT to assist patient with the identification of her negative distortions and irrational thoughts. Encouraged patient to verbalize alternative and factual responses which challenge her distortions. Reviewed patients self care plan. Assessed her progress related to self care. Patient's self care is good. Recommend proper diet, regular exercise, socialization and recreation.   Plan: Return again in two weeks.  Diagnosis: Axis I: Major Depression, Recurrent severe and Post Traumatic Stress Disorder    Axis II: No diagnosis    Jamill Wetmore, [name] 11/08/2011

## 2011-11-15 ENCOUNTER — Telehealth (HOSPITAL_COMMUNITY): Payer: Self-pay

## 2011-11-15 MED ORDER — SERTRALINE HCL 100 MG PO TABS: 150.0000 mg | ORAL_TABLET | Freq: Every day | ORAL | Status: DC

## 2011-11-15 NOTE — Telephone Encounter (Signed)
Will increase to 150 mg daily- script sent.

## 2011-11-19 ENCOUNTER — Telehealth (HOSPITAL_COMMUNITY): Payer: Self-pay | Admitting: Licensed Clinical Social Worker

## 2011-11-19 NOTE — Telephone Encounter (Signed)
Called patient back. No answer. Left message.

## 2011-11-19 NOTE — Telephone Encounter (Signed)
Message copied by Remus Loffler on Mon Nov 19, 2011  2:00 PM ------      Message from: Pat Kocher A      Created: Mon Nov 19, 2011 11:11 AM      Regarding: NEED TO SPEAK WITH YOU      Contact: [phone]       11:12AM  11/19/11                  [name],                  PT CALLED STATING THAT SHE NEED TO SPEAK WITH YOU ASAP - HAVING SOME PROBLEMS.                        THANKS      SYLVIA

## 2011-11-22 ENCOUNTER — Ambulatory Visit (INDEPENDENT_AMBULATORY_CARE_PROVIDER_SITE_OTHER): Payer: Medicare Other | Admitting: Licensed Clinical Social Worker

## 2011-11-22 ENCOUNTER — Telehealth (HOSPITAL_COMMUNITY): Payer: Self-pay

## 2011-11-22 DIAGNOSIS — F332 Major depressive disorder, recurrent severe without psychotic features: Secondary | ICD-10-CM

## 2011-11-22 NOTE — Progress Notes (Signed)
   THERAPIST PROGRESS NOTE  Session Time: 1:00pm-1:50pm  Participation Level: Active  Behavioral Response: Well GroomedAlertAnxious and Irritable  Type of Therapy: Individual Therapy  Treatment Goals addressed: Coping  Interventions: CBT, DBT, Strength-based and Supportive  Summary: [name] is a 46 y.o. female who presents with irritable mood and affect. She reports that [facility] has changed her generic Zolfot to a different company and she has become more depressed and irritable over the past two weeks as result. Her ability to manage problems effectively has decreased. She describes doing laundry and the drying falling off the wall and how this created a sense of panic and fear in her related to whether or not the apartment manage would help her and take her seriously. Her first response was to assume that they would not help her based on negative interactions with them in the recent past. She describes becoming agitated with them and was not happy that they didn't take care of this the same day. She has contacted Dr. Christell Constant about her response to the medication and Dr. Christell Constant has adjusted her medication accordingly. Her sleep has been inconsistent and her appetite is decreased due to stomach problems.    Suicidal/Homicidal: Nowithout intent/plan  Therapist Response: Processed w/pt her feelings of anger about how this change in medication which she had not control over has affected her. Provided feedback, encouraging and praising patient for contacting Dr. Christell Constant and taking control of her situation. Used DBT to help patient build distress tolerance. Reviewed coping strategies to manage her mood lability. Used CBT to assist patient with the identification of her negative distortions and irrational thoughts. Encouraged patient to verbalize alternative and factual responses which challenge her distortions. Reviewed patients self care plan. Assessed her progress related to self care. Patient's self  care is goo. Recommend proper diet, regular exercise, socialization and recreation.   Plan: Return again in two weeks.  Diagnosis: Axis I: Major Depression, Recurrent severe    Axis II: No diagnosis    Kailia Starry, [name] 11/22/2011

## 2011-11-23 MED ORDER — SERTRALINE HCL 100 MG PO TABS: 200.0000 mg | ORAL_TABLET | Freq: Every day | ORAL | Status: DC

## 2011-11-23 NOTE — Telephone Encounter (Signed)
[facility].

## 2011-11-30 DIAGNOSIS — R197 Diarrhea, unspecified: Secondary | ICD-10-CM | POA: Diagnosis not present

## 2011-11-30 DIAGNOSIS — R1084 Generalized abdominal pain: Secondary | ICD-10-CM | POA: Diagnosis not present

## 2011-11-30 DIAGNOSIS — I10 Essential (primary) hypertension: Secondary | ICD-10-CM | POA: Diagnosis not present

## 2011-12-06 ENCOUNTER — Encounter (HOSPITAL_COMMUNITY): Payer: Self-pay | Admitting: Licensed Clinical Social Worker

## 2011-12-06 ENCOUNTER — Ambulatory Visit (INDEPENDENT_AMBULATORY_CARE_PROVIDER_SITE_OTHER): Payer: Medicare Other | Admitting: Licensed Clinical Social Worker

## 2011-12-06 DIAGNOSIS — F332 Major depressive disorder, recurrent severe without psychotic features: Secondary | ICD-10-CM

## 2011-12-06 DIAGNOSIS — F4310 Post-traumatic stress disorder, unspecified: Secondary | ICD-10-CM | POA: Diagnosis not present

## 2011-12-06 NOTE — Progress Notes (Signed)
   THERAPIST PROGRESS NOTE  Session Time: 1:00pm-1:50pm  Participation Level: Active  Behavioral Response: Well GroomedAlertEuthymic  Type of Therapy: Individual Therapy  Treatment Goals addressed: Coping  Interventions: CBT, Solution Focused and Supportive  Summary: [name] is a 46 y.o. female who presents with euthymic mood and affect. She reports feeling much better since she is back on the generic Zoloft which worked better for her. She discusses her frustration with generic medications and her lack of control over which medication her pharmacy orders. She has some anxiety about school and is trying not to "stress out" about the six weeks left in the semester and her need to do well all her exams. She is socializing and ran into an ex-boyfriend over the weekend, which reminded her of being hurt by how he broke up with her. She wants to date but has not found anyone to date, but she remains hopeful that she may meet someone in [place] school. Her physical health has improved and her stomach irritation is improving with a change in diet. Her sleep and appetite are wnl.    Suicidal/Homicidal: Nowithout intent/plan  Therapist Response: Processed w/pt her feelings of anxiety over school and explored any negative self talk or negative assumptions which may contribute to increased test anxiety. Used CBT to assist patient with the identification of her negative distortions and irrational thoughts. Encouraged patient to verbalize alternative and factual responses which challenge her distortions. When she was taking a different generic of Zoloft, her PTSD symptoms where more prevalent and she struggled to effectively problem solve. Praised patient for her diligence regarding her medication. Reviewed patients self care plan. Assessed her progress related to self care. Patient's self care is good. Recommend proper diet, regular exercise, socialization and recreation.   Plan: Return again in two  weeks.  Diagnosis: Axis I: Major Depression, Recurrent severe and Post Traumatic Stress Disorder    Axis II: No diagnosis    Lurena Naeve, [name] 12/06/2011

## 2011-12-13 ENCOUNTER — Ambulatory Visit (INDEPENDENT_AMBULATORY_CARE_PROVIDER_SITE_OTHER): Payer: Medicare Other | Admitting: Licensed Clinical Social Worker

## 2011-12-13 ENCOUNTER — Encounter (HOSPITAL_COMMUNITY): Payer: Self-pay | Admitting: Licensed Clinical Social Worker

## 2011-12-13 DIAGNOSIS — F4310 Post-traumatic stress disorder, unspecified: Secondary | ICD-10-CM | POA: Diagnosis not present

## 2011-12-13 DIAGNOSIS — F332 Major depressive disorder, recurrent severe without psychotic features: Secondary | ICD-10-CM | POA: Diagnosis not present

## 2011-12-13 NOTE — Progress Notes (Signed)
   THERAPIST PROGRESS NOTE  Session Time: 1:00pm-1:50pm  Participation Level: Active  Behavioral Response: Well GroomedAlertAnxious and Depressed  Type of Therapy: Individual Therapy  Treatment Goals addressed: Anxiety, Coping and Diagnosis: depression  Interventions: CBT, DBT, Strength-based and Supportive  Summary: [name] is a 46 y.o. female who presents with depressed mood and anxious affect. Today is her birthday and she is saddened that she is alone, has not heard anything from her father and is focused on how friends in her life have a family or children, and she is alone. She has also been feeling ill, with Norovirus and has not fully recovered. 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 Her grandmother does not remember her anymore and she will not be able to attend her funeral in [place] due to finances. Her sleep is wnl and her appetite is poor.   Suicidal/Homicidal: Nowithout intent/plan  Therapist Response: Processed with patient her feelings of loneliness, depression, anxiety and frustration with her current life situation. Helped redirect patients negative thought patterns. Used CBT to assist patient with the identification of her negative distortions and irrational thoughts. Encouraged patient to verbalize alternative and factual responses which challenge her distortions. Explored ways in which she can focus on gratitude. Used DBT to help patient build distress tolerance and mindfulness. Reviewed patients self care plan. Assessed her progress related to self care. Patient's self care is good. Recommend proper diet, regular exercise, socialization and recreation.   Plan: Return again in two weeks.  Diagnosis: Axis I: Major Depression, Recurrent severe and Post Traumatic Stress Disorder    Axis II: No diagnosis    Chanita Boden, [name] 12/13/2011

## 2011-12-27 ENCOUNTER — Ambulatory Visit (INDEPENDENT_AMBULATORY_CARE_PROVIDER_SITE_OTHER): Payer: Medicare Other | Admitting: Licensed Clinical Social Worker

## 2011-12-27 DIAGNOSIS — F332 Major depressive disorder, recurrent severe without psychotic features: Secondary | ICD-10-CM

## 2011-12-27 NOTE — Progress Notes (Signed)
   THERAPIST PROGRESS NOTE  Session Time: 1:00pm-1:50pm  Participation Level: Active  Behavioral Response: Well GroomedAlertDepressed  Type of Therapy: Individual Therapy  Treatment Goals addressed: Coping  Interventions: CBT, Strength-based, Supportive and Reframing  Summary: [name] is a 46 y.o. female who presents with depressed mood and flat affect. 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 She is upset that it takes so much effort to find something to eat which she can tolerate. She is also upset that her parents did not send her a birthday card, but a Passover card instead. She believes this to mean that they are unhappy with who she is and want her to know how important their religion is and that it should be equally important to her. Her school related stress has increased as she gets closer to finals and she is angry about her lab partners. She discusses her anger and what to do about it.   Suicidal/Homicidal: Nowithout intent/plan  Therapist Response: Processed w/patient her feelings of disappointment in her parents. Explored her anger with them and how she copes by refocusing on those who care about her. She is fixated on her anger towards her lab partner. Assisted her with expression of anger and frustration. Assisted her with problem solving around this issue so she can get her needs met. She has some distorted thinking. Used CBT to assist patient with the identification of her negative distortions and irrational thoughts. Encouraged patient to verbalize alternative and factual responses which challenge her distortions. Reviewed patients self care plan. Assessed her progress related to self care. Patient's self care is good. Recommend proper diet, regular exercise, socialization and recreation.   Plan: Return again in two weeks.  Diagnosis: Axis I: Major Depression, Recurrent severe    Axis II: No diagnosis    Avett Reineck, [name] 12/27/2011

## 2012-01-10 ENCOUNTER — Ambulatory Visit (INDEPENDENT_AMBULATORY_CARE_PROVIDER_SITE_OTHER): Payer: Medicare Other | Admitting: Licensed Clinical Social Worker

## 2012-01-10 ENCOUNTER — Encounter (HOSPITAL_COMMUNITY): Payer: Self-pay | Admitting: Licensed Clinical Social Worker

## 2012-01-10 DIAGNOSIS — F4310 Post-traumatic stress disorder, unspecified: Secondary | ICD-10-CM | POA: Diagnosis not present

## 2012-01-10 DIAGNOSIS — F332 Major depressive disorder, recurrent severe without psychotic features: Secondary | ICD-10-CM

## 2012-01-10 NOTE — Progress Notes (Signed)
   THERAPIST PROGRESS NOTE  Session Time: 1:00pm-1:50pm  Participation Level: Active  Behavioral Response: Well GroomedAlertEuthymic  Type of Therapy: Individual Therapy  Treatment Goals addressed: Coping  Interventions: CBT, DBT, Strength-based and Reframing  Summary: [name] is a 46 y.o. female who presents with euthymic mood and bright affect. she reports improvement in her degree of depression, anxiety and irritability. She is pleased that she has finally found a generic form of Zoloft which is working for her. She is beginning to feel more in control of her emotions and is angry that she lost ground because two different generic versions of Zoloft did not work for her. She is pleased with her standing as the semester ends and is planning for the summer sessions. She realizes that she needs to get volunteer hours in and plans to work on securing this. 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 She continues to socialize and is looking to date. Her sleep and appetite are wnl and her IBS has improved.   Suicidal/Homicidal: Nowithout intent/plan  Therapist Response: Reviewed patients progress and assessed her current functioning. Processed w/pt her frustration about medication. Discussed ways in which she is managing her stress and reviewed self care. Her thinking is not as negative or distorted today. Provided support and encouragement. Used DBT skills to continue to build distress tolerance, mindfulness and a sense of safety. Her PTSD symptoms are well controlled at this time.   Plan: Return again in two weeks.  Diagnosis: Axis I: Major Depression, Recurrent severe and Post Traumatic Stress Disorder    Axis II: No diagnosis    Tlaloc Taddei, [name] 01/10/2012

## 2012-01-24 ENCOUNTER — Encounter (HOSPITAL_COMMUNITY): Payer: Self-pay | Admitting: Licensed Clinical Social Worker

## 2012-01-24 ENCOUNTER — Ambulatory Visit (HOSPITAL_COMMUNITY): Payer: Self-pay | Admitting: Licensed Clinical Social Worker

## 2012-01-24 ENCOUNTER — Ambulatory Visit (INDEPENDENT_AMBULATORY_CARE_PROVIDER_SITE_OTHER): Payer: Medicare Other | Admitting: Licensed Clinical Social Worker

## 2012-01-24 DIAGNOSIS — F332 Major depressive disorder, recurrent severe without psychotic features: Secondary | ICD-10-CM | POA: Diagnosis not present

## 2012-01-24 NOTE — Progress Notes (Signed)
   THERAPIST PROGRESS NOTE  Session Time: 2:00pm-2:50pm  Participation Level: Active  Behavioral Response: Well GroomedAlertEuthymic  Type of Therapy: Individual Therapy  Treatment Goals addressed: Coping  Interventions: CBT, Motivational Interviewing, Solution Focused and Reframing  Summary: [name] is a 46 y.o. female who presents with euthymic mood and affect. She has finished out her second semester and is pleased that for this semester she has a 4.0. She is surprised by this, especially because she changed her medication often. Overall she is pleased about school and is currently on a break, but is anxious about starting her first summer session on Monday. She is nervous about taking Statistics because she has not used the calculator required for a long time and must learn new software. She is fearful about being intimidated by much younger classmates. She knows she must start gaining volunteer hours, but is fearful and anxious about the process of starting. She doesn't know what to wear and is fearful that she will present herself as sloppy. She is nervous that she will not interview well because she has not worked in a long time. Her IBS has improved slightly due to effort on her part. Her sleep and appetite are wnl.   Suicidal/Homicidal: Nowithout intent/plan  Therapist Response: Reviewed patients progress and assessed her current functioning. Processed her anxiety, distorted thought processes and irrational thoughts related to Statistics and volunteering. Assisted her with insight development into cognitive distortions. Used CBT to assist patient with the identification of her negative distortions and irrational thoughts. Encouraged patient to verbalize alternative and factual responses which challenge her distortions. Used motivational interviewing to assist and encourage patient through the change process. Explored patients barriers to change. Discussed a time table by which  patient will begin to look into volunteering, which is in two weeks. Reviewed patients self care plan. Assessed her progress related to self care. Patient's self care is good. Recommend proper diet, regular exercise, socialization and recreation.   Plan: Return again in two weeks.  Diagnosis: Axis I: Major Depression, Recurrent severe    Axis II: No diagnosis    Kimanh Templeman, [name] 01/24/2012

## 2012-02-01 ENCOUNTER — Encounter (HOSPITAL_COMMUNITY): Payer: Self-pay | Admitting: Psychiatry

## 2012-02-01 ENCOUNTER — Ambulatory Visit (INDEPENDENT_AMBULATORY_CARE_PROVIDER_SITE_OTHER): Payer: Medicare Other | Admitting: Psychiatry

## 2012-02-01 VITALS — BP 96/62 | Ht 62.0 in | Wt 171.0 lb

## 2012-02-01 DIAGNOSIS — F329 Major depressive disorder, single episode, unspecified: Secondary | ICD-10-CM

## 2012-02-01 DIAGNOSIS — F4310 Post-traumatic stress disorder, unspecified: Secondary | ICD-10-CM

## 2012-02-01 NOTE — Progress Notes (Signed)
   [facility] Behavioral Health Follow-up Outpatient Visit  [name] 03/11/1966   Subjective: The patient is a 46 year old female who has been followed by [facility] since November of 2010. She is currently diagnosed with PTSD along with major depressive disorder. At her last appointment, we did not make any changes. We continue her Zoloft, Klonopin, and Ambien CR. She is actually down 5 pounds today. She has switched to a gluten-free diet. She feels like it's helping her in many ways including last issues with allergies. The patient made a 4.0 last semester. She is very proud of herself. She is taking summer school. She is currently enrolled in statistics, and we'll take microbiology next. Her only concern right now is having to get in volunteer hours. She's going to look into a sliding scale clinic in downtown [place], to see if they need volunteers. She has had some issues with the Zoloft. She has tried several different generics. She has found one carried by [facility] that appears to be efficacious. She is asking to go up on her Zoloft today. She does have stressed about school. She endorses good sleep and appetite. She denies any suicidal thoughts.  Filed Vitals:   02/01/12 1444  BP: 96/62    Mental Status Examination  Appearance: Casual Alert: Yes Attention: good  Cooperative: Yes Eye Contact: Good Speech: Regular rate rhythm and volume Psychomotor Activity: Normal Memory/Concentration: Intact Oriented: person, place, time/date and situation Mood: Euthymic Affect: Congruent Thought Processes and Associations: Logical Fund of Knowledge: Fair Thought Content: No suicidal or homicidal thought Insight: Fair Judgement: Fair  Diagnosis: PTSD, MDD  Treatment Plan: We'll increase Zoloft to 150 mg daily. We will continue the Klonopin and Ambien CR. Patient to call with concerns. I will see her back in 6 weeks.  Jamse Mead, MD

## 2012-02-07 ENCOUNTER — Ambulatory Visit (HOSPITAL_COMMUNITY): Payer: Self-pay | Admitting: Licensed Clinical Social Worker

## 2012-02-08 ENCOUNTER — Ambulatory Visit (INDEPENDENT_AMBULATORY_CARE_PROVIDER_SITE_OTHER): Payer: Medicare Other | Admitting: Licensed Clinical Social Worker

## 2012-02-08 DIAGNOSIS — F332 Major depressive disorder, recurrent severe without psychotic features: Secondary | ICD-10-CM | POA: Diagnosis not present

## 2012-02-08 NOTE — Progress Notes (Signed)
   THERAPIST PROGRESS NOTE  Session Time: 4:00pm-4:50pm  Participation Level: Active  Behavioral Response: Well GroomedAlertEuthymic  Type of Therapy: Individual Therapy  Treatment Goals addressed: Coping  Interventions: Motivational Interviewing, Strength-based and Reframing  Summary: [name] is a 46 y.o. female who presents with euthymic mood and bright affect. She reports doing well, with improved mood and decreased anxiety. She is pleased that her Zoloft dose has been increased to 150mg  because she was concerned about her irritability after she threw something at her dog. Her anxiety over her statistics class is reduced as well, since she started and realized that her thoughts were inaccurate and distorted. She wants to get a job and has applied for one on campus. She has not followed up to gain volunteer hours and demonstrates anxiety and avoidance related to this. She remains social and has many activities planned. Her sleep and appetite are wnl.   Suicidal/Homicidal: Nowithout intent/plan  Therapist Response: Reviewed patients progress and assessed her current functioning. 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 Explored her fears and distorted thoughts supporting her anxiety. Used CBT to assist patient with the identification of her negative distortions and irrational thoughts. Encouraged patient to verbalize alternative and factual responses which challenge her distortions. Used motivational interviewing to assist and encourage patient through the change process. Explored patients barriers to change. Reviewed patients self care plan. Assessed her progress related to self care. Patient's self care is good. Recommend proper diet, regular exercise, socialization and recreation.   Plan: Return again in 2 weeks.  Diagnosis: Axis I: Major Depression, Recurrent severe    Axis II: No diagnosis    Jerrie Schussler, [name] 02/08/2012

## 2012-02-14 ENCOUNTER — Telehealth (HOSPITAL_COMMUNITY): Payer: Self-pay

## 2012-02-14 MED ORDER — ZOLPIDEM TARTRATE ER 6.25 MG PO TBCR: 12.5000 mg | EXTENDED_RELEASE_TABLET | Freq: Every evening | ORAL | Status: DC | PRN

## 2012-02-14 NOTE — Telephone Encounter (Signed)
Ok- will call pt in the a.m.

## 2012-02-21 ENCOUNTER — Ambulatory Visit (HOSPITAL_COMMUNITY): Payer: Self-pay | Admitting: Licensed Clinical Social Worker

## 2012-02-22 ENCOUNTER — Ambulatory Visit (INDEPENDENT_AMBULATORY_CARE_PROVIDER_SITE_OTHER): Payer: Medicare Other | Admitting: Licensed Clinical Social Worker

## 2012-02-22 DIAGNOSIS — F332 Major depressive disorder, recurrent severe without psychotic features: Secondary | ICD-10-CM | POA: Diagnosis not present

## 2012-02-22 NOTE — Progress Notes (Signed)
   THERAPIST PROGRESS NOTE  Session Time: 10:30am-11:20am  Participation Level: Active  Behavioral Response: Well GroomedAlertAnxious  Type of Therapy: Individual Therapy  Treatment Goals addressed: Coping  Interventions: CBT, Solution Focused and Reframing  Summary: [name] is a 46 y.o. female who presents with anxious mood and affect. She reports significant stress over the past two weeks and reports that she had a "mini melt down" but was pleased that she was able to gather herself and refocus within 24 hours. She has been stressed about school, her friends parents dying and problems with her apartment. She is saddened over the death of both parents of a friend of hers and is confused over her friends response to the deaths. Patients friend has left the country and patient was unaware of any funeral to attend, which she wanted to attend. 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 Her sleep and appetite are wnl.   Suicidal/Homicidal: Nowithout intent/plan  Therapist Response: Reviewed patients progress and assessed her current functioning. Patient demonstrates continued improvement in her ability to get back on track and manage her distorted thinking. She continues to struggle with normal amounts of anxiety related to her PTSD. Used CBT and DBT to help patient practice distress tolerance and reframe her thinking. Reviewed patients self care plan. Assessed her progress related to self care. Patient's self care is good. Recommend proper diet, regular exercise, socialization and recreation.   Plan: Return again in four weeks.  Diagnosis: Axis I: Major Depression, Recurrent severe and Post Traumatic Stress Disorder    Axis II: No diagnosis    Jontae Adebayo, [name] 02/22/2012

## 2012-02-28 DIAGNOSIS — L299 Pruritus, unspecified: Secondary | ICD-10-CM | POA: Diagnosis not present

## 2012-02-28 DIAGNOSIS — K591 Functional diarrhea: Secondary | ICD-10-CM | POA: Diagnosis not present

## 2012-02-28 DIAGNOSIS — K589 Irritable bowel syndrome without diarrhea: Secondary | ICD-10-CM | POA: Diagnosis not present

## 2012-02-28 DIAGNOSIS — K219 Gastro-esophageal reflux disease without esophagitis: Secondary | ICD-10-CM | POA: Diagnosis not present

## 2012-03-04 DIAGNOSIS — R141 Gas pain: Secondary | ICD-10-CM | POA: Diagnosis not present

## 2012-03-04 DIAGNOSIS — D509 Iron deficiency anemia, unspecified: Secondary | ICD-10-CM | POA: Diagnosis not present

## 2012-03-04 DIAGNOSIS — E669 Obesity, unspecified: Secondary | ICD-10-CM | POA: Diagnosis not present

## 2012-03-04 DIAGNOSIS — E039 Hypothyroidism, unspecified: Secondary | ICD-10-CM | POA: Diagnosis not present

## 2012-03-04 DIAGNOSIS — R143 Flatulence: Secondary | ICD-10-CM | POA: Diagnosis not present

## 2012-03-04 DIAGNOSIS — R1032 Left lower quadrant pain: Secondary | ICD-10-CM | POA: Diagnosis not present

## 2012-03-04 DIAGNOSIS — K589 Irritable bowel syndrome without diarrhea: Secondary | ICD-10-CM | POA: Diagnosis not present

## 2012-03-06 ENCOUNTER — Ambulatory Visit (HOSPITAL_COMMUNITY): Payer: Self-pay | Admitting: Licensed Clinical Social Worker

## 2012-03-07 ENCOUNTER — Ambulatory Visit (HOSPITAL_COMMUNITY): Payer: Self-pay | Admitting: Licensed Clinical Social Worker

## 2012-03-14 ENCOUNTER — Ambulatory Visit (INDEPENDENT_AMBULATORY_CARE_PROVIDER_SITE_OTHER): Payer: Medicare Other | Admitting: Psychiatry

## 2012-03-14 ENCOUNTER — Encounter (HOSPITAL_COMMUNITY): Payer: Self-pay | Admitting: Psychiatry

## 2012-03-14 VITALS — BP 122/80 | Ht 62.0 in | Wt 174.0 lb

## 2012-03-14 DIAGNOSIS — F329 Major depressive disorder, single episode, unspecified: Secondary | ICD-10-CM

## 2012-03-14 DIAGNOSIS — F4310 Post-traumatic stress disorder, unspecified: Secondary | ICD-10-CM | POA: Diagnosis not present

## 2012-03-14 NOTE — Progress Notes (Signed)
   [facility] Behavioral Health Follow-up Outpatient Visit  TALLIE DODDS [birth year]   Subjective: The patient is a 46 year old female who has been followed by [facility] since November of 2010. She is currently diagnosed with PTSD along with major depressive disorder. At her last appointment, she was feeling more anxious regarding school. She was asking to go up on her Zoloft. At that time we went up on Zoloft 150 mg daily. I continued her Ambien CR, and her Klonopin. The patient called on 02/14/2012 asking to decrease her Ambien to 6.25. I did so. She presents today. She ended up and statistics. She changed her microbiology course to college algebra. She is still looking for volunteer opportunities. She has cut back somewhat on socializing but she did host her women's group at her apartment. There are 4 people there. The patient continues to see Belenda Cruise for therapy. She is doing well with the increase in the Zoloft. She does report that she hard how hard time going down on the Ambien. She had rebound insomnia. She was hoping that it would help with some careless mistakes that she makes. She attributes his mistakes to either the Ambien or the Klonopin. She has gone back up on the Ambien CR 12.5 mg. She endorses good sleep and appetite. She started herself. She feels as though her anxiety is under control.  Filed Vitals:   03/14/12 1432  BP: 122/80    Mental Status Examination  Appearance: Casual Alert: Yes Attention: good  Cooperative: Yes Eye Contact: Good Speech: Regular rate rhythm and volume Psychomotor Activity: Normal Memory/Concentration: Intact Oriented: person, place, time/date and situation Mood: Euthymic Affect: Congruent Thought Processes and Associations: Logical Fund of Knowledge: Fair Thought Content: No suicidal or homicidal thought Insight: Fair Judgement: Fair  Diagnosis: PTSD, MDD  Treatment Plan: I will not make any changes today. I will  re-increase the Ambien to 12.5 mg, since the patient has already done this on her own. I will see her back in 3 months.  Jamse Mead, MD

## 2012-03-21 ENCOUNTER — Ambulatory Visit (INDEPENDENT_AMBULATORY_CARE_PROVIDER_SITE_OTHER): Payer: Medicare Other | Admitting: Licensed Clinical Social Worker

## 2012-03-21 DIAGNOSIS — F332 Major depressive disorder, recurrent severe without psychotic features: Secondary | ICD-10-CM

## 2012-03-21 NOTE — Progress Notes (Signed)
   THERAPIST PROGRESS NOTE  Session Time: 11:30am-12:20pm  Participation Level: Active  Behavioral Response: Well GroomedAlertDepressed  Type of Therapy: Individual Therapy  Treatment Goals addressed: Coping  Interventions: CBT, Supportive and Reframing  Summary: [name] is a 46 y.o. female who presents with depressed mood and sad affect. She reports an increase in her depression over the past month and feels it is related to increased isolation because she is taking only one class this semester and it is online. She misses social interaction and has been trying to see her friends more, but this has been difficult. She is also disappointed with her online [employer] B[employer] class, which has turned out to be more difficult than she thought. She is also disappointed that she is unable to gain any volunteer hours this summer and will have to manage volunteering in the fall with her classes. She has put herself on dating web sites specific for sexual relationships, and feels as though she can navigate this safely. Her sleep and appetite are wnl.   Suicidal/Homicidal: Nowithout intent/plan  Therapist Response: Processed w/pt her feelings of depression and explored other opportunities for socialization. Patient demonstrates limited insight and judgment regarding meeting men online for a relationship where sex is expected. She underestimates the danger she is putting herself in. Provided feedback, challenging patients denial about this. Explored risk to self and how her PTSD makes it difficult for her to judge risk accurately. Patient is willing to meet these men in public places, but is resistant to stop. Used CBT to assist patient with the identification of her negative distortions and irrational thoughts. Encouraged patient to verbalize alternative and factual responses which challenge her distortions. Reviewed patients self care plan. Assessed her progress related to self care. Patient's self care is  fair. Recommend proper diet, regular exercise, socialization and recreation.   Plan: Return again in two weeks.  Diagnosis: Axis I: Major Depression, Recurrent severe and Post Traumatic Stress Disorder    Axis II: No diagnosis    Virgil Slinger, [name] 03/21/2012

## 2012-03-28 ENCOUNTER — Encounter (HOSPITAL_COMMUNITY): Payer: Self-pay | Admitting: Licensed Clinical Social Worker

## 2012-03-28 ENCOUNTER — Ambulatory Visit (INDEPENDENT_AMBULATORY_CARE_PROVIDER_SITE_OTHER): Payer: Medicare Other | Admitting: Licensed Clinical Social Worker

## 2012-03-28 DIAGNOSIS — F332 Major depressive disorder, recurrent severe without psychotic features: Secondary | ICD-10-CM | POA: Diagnosis not present
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
## 2012-04-04 ENCOUNTER — Ambulatory Visit (HOSPITAL_COMMUNITY): Payer: Self-pay | Admitting: Licensed Clinical Social Worker

## 2012-04-11 ENCOUNTER — Ambulatory Visit (INDEPENDENT_AMBULATORY_CARE_PROVIDER_SITE_OTHER): Payer: Medicare Other | Admitting: Licensed Clinical Social Worker

## 2012-04-11 DIAGNOSIS — F332 Major depressive disorder, recurrent severe without psychotic features: Secondary | ICD-10-CM
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
## 2012-04-16 ENCOUNTER — Other Ambulatory Visit (HOSPITAL_COMMUNITY): Payer: Self-pay | Admitting: Psychiatry

## 2012-04-16 MED ORDER — ZOLPIDEM TARTRATE ER 12.5 MG PO TBCR: 12.5000 mg | EXTENDED_RELEASE_TABLET | Freq: Every evening | ORAL | Status: DC | PRN

## 2012-04-18 ENCOUNTER — Encounter (HOSPITAL_COMMUNITY): Payer: Self-pay | Admitting: Licensed Clinical Social Worker

## 2012-04-18 ENCOUNTER — Ambulatory Visit (INDEPENDENT_AMBULATORY_CARE_PROVIDER_SITE_OTHER): Payer: Medicare Other | Admitting: Licensed Clinical Social Worker

## 2012-04-18 DIAGNOSIS — F4310 Post-traumatic stress disorder, unspecified: Secondary | ICD-10-CM

## 2012-04-18 DIAGNOSIS — F332 Major depressive disorder, recurrent severe without psychotic features: Secondary | ICD-10-CM | POA: Diagnosis not present

## 2012-04-18 NOTE — Progress Notes (Signed)
   THERAPIST PROGRESS NOTE  Session Time: 10:30am-11:20am  Participation Level: Active  Behavioral Response: Well GroomedAlertIrritable  Type of Therapy: Individual Therapy  Treatment Goals addressed: Coping  Interventions: CBT, Supportive and Reframing  Summary: [name] is a 46 y.o. female who presents with irritable mood and affect. She is upset because the man she has been dating has started to ignore her and has not returned her calls. She feels rejected and endorses frustration and agitation. She is defensive while talking clearly about her goals, what she has learned from dating and what her new and clear boundaries are. She is thankful that she did not sleep with him and recognizes how this protected her from further confusion and rejection. Sleep and appetite are wnl.    Suicidal/Homicidal: Nowithout intent/plan  Therapist Response: Assessed patients current functioning and reviewed progress. Processed her anger and frustration. Reviewed her progress related to distress tolerance in relationships, clear expression of boundaries and assertive communication. Her thinking is clear, without distortions or negative self talk. Reviewed coping strategies. Reviewed patients self care plan. Assessed her progress related to self care. Patient's self care is good. Recommend proper diet, regular exercise, socialization and recreation. Discussed preparation for school.   Plan: Return again in two weeks.  Diagnosis: Axis I: Major Depression, Recurrent severe and Post Traumatic Stress Disorder    Axis II: No diagnosis    Arik Husmann, [name] 04/18/2012

## 2012-04-25 ENCOUNTER — Encounter (HOSPITAL_COMMUNITY): Payer: Self-pay | Admitting: Licensed Clinical Social Worker

## 2012-04-25 ENCOUNTER — Ambulatory Visit (INDEPENDENT_AMBULATORY_CARE_PROVIDER_SITE_OTHER): Payer: Medicare Other | Admitting: Licensed Clinical Social Worker

## 2012-04-25 DIAGNOSIS — F4310 Post-traumatic stress disorder, unspecified: Secondary | ICD-10-CM

## 2012-04-25 DIAGNOSIS — F332 Major depressive disorder, recurrent severe without psychotic features: Secondary | ICD-10-CM

## 2012-04-25 NOTE — Progress Notes (Signed)
   THERAPIST PROGRESS NOTE  Session Time: 10:30am-11:20am  Participation Level: Active  Behavioral Response: Well GroomedAlertEuthymic  Type of Therapy: Individual Therapy  Treatment Goals addressed: Coping  Interventions: CBT, DBT, Supportive and Reframing  Summary: [name] is a 46 y.o. female who presents with euthymic mood and bright affect. She is frustrated with her aunt whom she is scheduled to visit because she will not allow patient to bring he dogs with her. Patient does not understand why her aunt won't allow this and has difficulty accepting that her aunt has the right to choose if she wants dogs in her home or not. Patient processes her emotional dependence upon her animals and how she struggled the last time she left town without her animals. She becomes easily disappointed if others do not accomadate her and can assume negative beliefs about this projection. She is able to receive feedback about her unrealistic expectations and is able to identify her distortions. Her sleep and appetite are wnl.   Suicidal/Homicidal: Nowithout intent/plan  Therapist Response: Assessed patients current functioning and reviewed progress. Reviewed coping strategies. Assessed patients safety and assisted in identifying protective factors.  Reviewed crisis plan with patient. Assisted patient with the expression of her feelings of disappointment, frustration and anxiety. Used CBT to assist patient with the identification of negative distortions and irrational thoughts. Encouraged patient to verbalize alternative and factual responses which challenge thought distortions. Patient continues to make progress related to realigning her thinking and building distress tolerance. Used DBT to practice mindfulness, review distraction list and improve distress tolerance skills. Reviewed patients self care plan. Assessed  progress related to self care. Patient's self care is good. Recommend proper diet, regular  exercise, socialization and recreation.   Plan: Return again in two weeks.  Diagnosis: Axis I: Major Depression, Recurrent severe and Post Traumatic Stress Disorder    Axis II: No diagnosis    Julea Hutto, [name] 04/25/2012

## 2012-05-01 ENCOUNTER — Ambulatory Visit (HOSPITAL_COMMUNITY): Payer: Self-pay | Admitting: Licensed Clinical Social Worker

## 2012-05-02 ENCOUNTER — Other Ambulatory Visit (HOSPITAL_COMMUNITY): Payer: Self-pay | Admitting: Psychiatry

## 2012-05-16 ENCOUNTER — Ambulatory Visit (HOSPITAL_COMMUNITY): Payer: Self-pay | Admitting: Licensed Clinical Social Worker

## 2012-05-27 DIAGNOSIS — R142 Eructation: Secondary | ICD-10-CM | POA: Diagnosis not present

## 2012-05-27 DIAGNOSIS — R197 Diarrhea, unspecified: Secondary | ICD-10-CM | POA: Diagnosis not present

## 2012-05-27 DIAGNOSIS — R152 Fecal urgency: Secondary | ICD-10-CM | POA: Diagnosis not present

## 2012-05-27 DIAGNOSIS — K219 Gastro-esophageal reflux disease without esophagitis: Secondary | ICD-10-CM | POA: Diagnosis not present

## 2012-05-30 ENCOUNTER — Ambulatory Visit (INDEPENDENT_AMBULATORY_CARE_PROVIDER_SITE_OTHER): Payer: Medicare Other | Admitting: Licensed Clinical Social Worker

## 2012-05-30 DIAGNOSIS — F332 Major depressive disorder, recurrent severe without psychotic features: Secondary | ICD-10-CM | POA: Diagnosis not present

## 2012-05-30 DIAGNOSIS — F4310 Post-traumatic stress disorder, unspecified: Secondary | ICD-10-CM | POA: Diagnosis not present

## 2012-05-30 NOTE — Progress Notes (Signed)
   THERAPIST PROGRESS NOTE  Session Time: 11:30am-12:20pm  Participation Level: Active  Behavioral Response: Well GroomedAlertEuthymic  Type of Therapy: Individual Therapy  Treatment Goals addressed: Coping  Interventions: CBT, Strength-based and Reframing  Summary: [name] is a 46 y.o. female who presents with euthymic mood and bright affect. She reports that she has been doing well, with continued control over her depression and anxiety. She has decided to drop Organic Chemistry this semester and processes her recent thoughts about going to PA school. She is uncertain if she has the stamina to handle the requirements of such programs. She has begun to explore other options, working part time, but plans to continue through this semester and continually reevaluate her stamina level. She is not anxious or upset over this idea and appears relieved to have reconciled herself to her limitations and rigidity of time, diet and rest. She remains active with her friends and in her community and has begun volunteering at [facility]. Her sleep and appetite are wnl.     Suicidal/Homicidal: Nowithout intent/plan  Therapist Response: Assessed patients current functioning and reviewed progress. Reviewed coping strategies. Assessed patients safety and assisted in identifying protective factors.  Reviewed crisis plan with patient. Assisted patient with the expression of her feelings associated with her limitations. . Assisted her with increased insight into future plans given her limitations. .Reviewed patients self care plan. Assessed  progress related to self care. Patient's self care is good. Recommend proper diet, regular exercise, socialization and recreation. Used CBT to assist patient with the identification of negative distortions and irrational thoughts. Encouraged patient to verbalize alternative and factual responses which challenge thought distortions.   Plan: Return again in two  weeks.  Diagnosis: Axis I: Major Depression, Recurrent severe and Post Traumatic Stress Disorder    Axis II: No diagnosis    Ramie Palladino, [name] 05/30/2012

## 2012-06-06 ENCOUNTER — Ambulatory Visit (INDEPENDENT_AMBULATORY_CARE_PROVIDER_SITE_OTHER): Payer: Medicare Other | Admitting: Licensed Clinical Social Worker

## 2012-06-06 ENCOUNTER — Encounter (HOSPITAL_COMMUNITY): Payer: Self-pay | Admitting: Licensed Clinical Social Worker

## 2012-06-06 DIAGNOSIS — F332 Major depressive disorder, recurrent severe without psychotic features: Secondary | ICD-10-CM

## 2012-06-06 DIAGNOSIS — F4310 Post-traumatic stress disorder, unspecified: Secondary | ICD-10-CM

## 2012-06-06 NOTE — Progress Notes (Signed)
   THERAPIST PROGRESS NOTE  Session Time: 11:30am-12:20pm  Participation Level: Active  Behavioral Response: Well GroomedAlertEuthymic  Type of Therapy: Individual Therapy  Treatment Goals addressed: Coping  Interventions: CBT, Strength-based and Reframing  Summary: [name] is a 46 y.o. female who presents with euthymic mood and bright affect. She reports continued and sustained improvement in her depression and anxiety. She is very proud that she has completed Geneticist, molecular. She remains frustrated with her IBS problems, but has made some recent changes which seem to be helping her pain and diarrhea. She continues to process her thoughts about PA school, whether or not she has the stamina to complete such a program. She remains focused on creating more life/school balance and wants to grow her social circle as she feels disconnected when a good friend is not available to her. She endorses improved distress tolerance and feels "calmer" in general. Her sleep and appetite are wnl.     Suicidal/Homicidal: Nowithout intent/plan  Therapist Response: Assessed patients current functioning and reviewed progress. Reviewed coping strategies. Assessed patients safety and assisted in identifying protective factors.  Reviewed crisis plan with patient. Assisted patient with the expression of her feelings of frustration about her chronic health problems patient demonstrates excellent motivation towards her wellness and she continues to explore alternative ways of eating and medication. Used DBT to practice mindfulness, review distraction list and improve distress tolerance skills. Used CBT to assist patient with the identification of negative distortions and irrational thoughts. Encouraged patient to verbalize alternative and factual responses which challenge thought distortions. Reviewed patients self care plan. Assessed  progress related to self care. Patient's self care is good. Recommend  proper diet, regular exercise, socialization and recreation.   Plan: Return again in two weeks.  Diagnosis: Axis I: Major Depression, Recurrent severe    Axis II: No diagnosis    Akylah Hascall, [name] 06/06/2012

## 2012-06-10 ENCOUNTER — Ambulatory Visit (HOSPITAL_COMMUNITY): Payer: Medicare Other | Admitting: Licensed Clinical Social Worker

## 2012-06-20 ENCOUNTER — Encounter (HOSPITAL_COMMUNITY): Payer: Self-pay | Admitting: Psychiatry

## 2012-06-20 ENCOUNTER — Ambulatory Visit (INDEPENDENT_AMBULATORY_CARE_PROVIDER_SITE_OTHER): Payer: Medicare Other | Admitting: Psychiatry

## 2012-06-20 VITALS — BP 115/75 | Ht 62.0 in | Wt 172.0 lb

## 2012-06-20 DIAGNOSIS — F329 Major depressive disorder, single episode, unspecified: Secondary | ICD-10-CM

## 2012-06-20 DIAGNOSIS — F4310 Post-traumatic stress disorder, unspecified: Secondary | ICD-10-CM | POA: Diagnosis not present

## 2012-06-20 MED ORDER — SERTRALINE HCL 100 MG PO TABS: 200.0000 mg | ORAL_TABLET | Freq: Every day | ORAL | Status: DC

## 2012-06-20 NOTE — Progress Notes (Signed)
   [facility] Follow-up Outpatient Visit  [name] [birth year]   Subjective: The patient is a 46 year old female who has been followed by [facility] since November of 2010. She is currently diagnosed with PTSD along with major depressive disorder. At her last appointment, I increased her Ambien CR back to 12.5 mg at bedtime. I continued everything else. She presents today. She is only taking 1 class this fall. He dropped organic chemistry because of difficulty with it. She has continued with her microbiology. 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 Her paternal grandmother who is 58 years old lives in [place]. She has severe dementia. When the patient was speaking to dad recently, he offered to fly the patient over. The patient will be going to [place] next Wednesday and staying for a week. She is excited about this. The patient reports her IBS is improving. She has increased her probiotics and that's making a difference. The patient does not feel that she's sleeping as well. She's not sure of his anxiety related to school. She endorses good appetite. She feels that her mood has been in control.  Filed Vitals:   06/20/12 1101  BP: 115/75    Mental Status Examination  Appearance: Casual Alert: Yes Attention: good  Cooperative: Yes Eye Contact: Good Speech: Regular rate rhythm and volume Psychomotor Activity: Normal Memory/Concentration: Intact Oriented: person, place, time/date and situation Mood: Euthymic Affect: Congruent Thought Processes and Associations: Logical Fund of Knowledge: Fair Thought Content: No suicidal or homicidal thought Insight: Fair Judgement: Fair  Diagnosis: PTSD, MDD  Treatment Plan: I will not make any changes today. Patient may try a one-week holiday from Ambien to see if restarting it and makes it more effective. I will see her back in 3 months. Patient to call with concerns.   Jamse Mead,  MD

## 2012-06-26 ENCOUNTER — Ambulatory Visit (HOSPITAL_COMMUNITY): Payer: Self-pay | Admitting: Licensed Clinical Social Worker

## 2012-07-10 ENCOUNTER — Ambulatory Visit (HOSPITAL_COMMUNITY): Payer: Self-pay | Admitting: Licensed Clinical Social Worker

## 2012-07-25 ENCOUNTER — Encounter (HOSPITAL_COMMUNITY): Payer: Self-pay | Admitting: Licensed Clinical Social Worker

## 2012-07-25 ENCOUNTER — Ambulatory Visit (INDEPENDENT_AMBULATORY_CARE_PROVIDER_SITE_OTHER): Payer: Medicare Other | Admitting: Licensed Clinical Social Worker

## 2012-07-25 DIAGNOSIS — F332 Major depressive disorder, recurrent severe without psychotic features: Secondary | ICD-10-CM

## 2012-07-25 NOTE — Progress Notes (Signed)
   THERAPIST PROGRESS NOTE  Session Time: 10:30am-11:20am  Participation Level: Active  Behavioral Response: Well GroomedAlertEuthymic  Type of Therapy: Individual Therapy  Treatment Goals addressed: Coping  Interventions: Strength-based, Supportive and Reframing  Summary: [name] is a 46 y.o. female who presents with euthymic mood and bright affect. Patient has not been to therapy in almost two months and reports that this is due to recent travel to [place] to see her 86 year old grandmother before she dies. She was very pleased with this trip and her positive experience with family who wanted to spend time with her. She also recently traveled to [facility] for a perfume convention, but found [place] to be overwhelming to her senses and did not have an enjoyable time. She is doing well in school and is volunteering with hospice. She is now looking into medical school for naturopaths and is excited to explore this option. Overall, her sleep and appetite are wnl. She is managing her IBS well and remains social with friends.    Suicidal/Homicidal: Nowithout intent/plan  Therapist Response: Assessed patients current functioning and reviewed progress. Reviewed coping strategies. Assessed patients safety and assisted in identifying protective factors.  Reviewed crisis plan with patient. Assisted patient with the expression of her feelings associated with seeing her family and experiencing feeling loved again. Used DBT to practice mindfulness, review distraction list and improve distress tolerance skills. Reviewed patients self care plan. Assessed  progress related to self care. Patient's self care is good. Recommend proper diet, regular exercise, socialization and recreation.   Plan: Return again in one to two weeks.  Diagnosis: Axis I: Major Depression, Recurrent severe    Axis II: No diagnosis    Dondi Aime, [name] 07/25/2012

## 2012-08-01 ENCOUNTER — Encounter (HOSPITAL_COMMUNITY): Payer: Self-pay | Admitting: Licensed Clinical Social Worker

## 2012-08-01 ENCOUNTER — Telehealth (HOSPITAL_COMMUNITY): Payer: Self-pay

## 2012-08-01 ENCOUNTER — Ambulatory Visit (INDEPENDENT_AMBULATORY_CARE_PROVIDER_SITE_OTHER): Payer: Medicare Other | Admitting: Licensed Clinical Social Worker

## 2012-08-01 DIAGNOSIS — F332 Major depressive disorder, recurrent severe without psychotic features: Secondary | ICD-10-CM

## 2012-08-01 NOTE — Telephone Encounter (Signed)
Pt having mood swings and weepiness. Would like to increase zoloft

## 2012-08-01 NOTE — Progress Notes (Signed)
   THERAPIST PROGRESS NOTE  Session Time: 11:30am-12:20pm  Participation Level: Active  Behavioral Response: Well GroomedAlertAnxious and Irritable  Type of Therapy: Individual Therapy  Treatment Goals addressed: Anxiety and Coping  Interventions: CBT, DBT, Strength-based, Supportive and Reframing  Summary: [name] is a 46 y.o. female who presents with anxious mood and affect. She reports an increase in anxiety and irritability over the past few days and she is unhappy about this. She has called Dr. Christell Constant and her Zoloft has been increased from 150mg  to 200mg  and she is hopeful this will help. She believes that she is most likely in perimenopuase with symptoms of night sweats, hot flashes, increased irritability, "foggy brain" and mood lability. She will follow up with her doctor.  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 She would move to [facility] and is excited about this. She processes her fears and how to plan for such a large transition. She questions if her increase in anxiety is related to her PTSD. Her sleep and appetite are wnl.   Suicidal/Homicidal: Nowithout intent/plan  Therapist Response: Assessed patients current functioning and reviewed progress. Reviewed coping strategies. Assessed patients safety and assisted in identifying protective factors.  Reviewed crisis plan with patient. Assisted patient with the expression of her feelings of anxiety. Used DBT to practice mindfulness, review distraction list and improve distress tolerance skills. Used CBT to assist patient with the identification of negative distortions and irrational thoughts. Encouraged patient to verbalize alternative and factual responses which challenge thought distortions. Provided feedback about her symptoms, which are not reflective of her PTSD, as she remains functional in all areas of her  life. Her response is normal related to possible life changes and hormones. Used CBT to assist patient with the identification of negative distortions and irrational thoughts. Encouraged patient to verbalize alternative and factual responses which challenge thought distortions. Reviewed patients self care plan. Assessed  progress related to self care. Patient's self care is good. Recommend proper diet, regular exercise, socialization and recreation.   Plan: Return again in one weeks.  Diagnosis: Axis I: Major Depression, Recurrent severe    Axis II: No diagnosis    Tandy Grawe, [name] 08/01/2012

## 2012-08-01 NOTE — Telephone Encounter (Signed)
Hard time with shorter days.  Figuring out future path.  More emotional.  Took 200 mg of Zoloft this am. Feels brighter.  Appt in Jan.

## 2012-08-07 ENCOUNTER — Ambulatory Visit (HOSPITAL_BASED_OUTPATIENT_CLINIC_OR_DEPARTMENT_OTHER)
Admission: RE | Admit: 2012-08-07 | Discharge: 2012-08-07 | Disposition: A | Discharge status: Home / Self Care | Payer: Medicare Other | Source: Ambulatory Visit | Attending: Internal Medicine | Admitting: Internal Medicine

## 2012-08-07 ENCOUNTER — Encounter: Payer: Self-pay | Admitting: Internal Medicine

## 2012-08-07 ENCOUNTER — Ambulatory Visit (INDEPENDENT_AMBULATORY_CARE_PROVIDER_SITE_OTHER): Payer: Medicare Other | Admitting: Internal Medicine

## 2012-08-07 VITALS — BP 122/80 | HR 70 | Temp 96.9°F | Resp 18 | Wt 172.0 lb

## 2012-08-07 DIAGNOSIS — I10 Essential (primary) hypertension: Secondary | ICD-10-CM | POA: Insufficient documentation

## 2012-08-07 DIAGNOSIS — Z8669 Personal history of other diseases of the nervous system and sense organs: Secondary | ICD-10-CM

## 2012-08-07 DIAGNOSIS — E139 Other specified diabetes mellitus without complications: Secondary | ICD-10-CM

## 2012-08-07 DIAGNOSIS — Z87898 Personal history of other specified conditions: Secondary | ICD-10-CM | POA: Insufficient documentation

## 2012-08-07 DIAGNOSIS — Z1231 Encounter for screening mammogram for malignant neoplasm of breast: Secondary | ICD-10-CM | POA: Insufficient documentation

## 2012-08-07 DIAGNOSIS — J45909 Unspecified asthma, uncomplicated: Secondary | ICD-10-CM

## 2012-08-07 DIAGNOSIS — K219 Gastro-esophageal reflux disease without esophagitis: Secondary | ICD-10-CM

## 2012-08-07 DIAGNOSIS — N951 Menopausal and female climacteric states: Secondary | ICD-10-CM

## 2012-08-07 DIAGNOSIS — Z139 Encounter for screening, unspecified: Secondary | ICD-10-CM

## 2012-08-07 DIAGNOSIS — Z23 Encounter for immunization: Secondary | ICD-10-CM | POA: Diagnosis not present

## 2012-08-07 DIAGNOSIS — E119 Type 2 diabetes mellitus without complications: Secondary | ICD-10-CM | POA: Diagnosis not present

## 2012-08-07 DIAGNOSIS — E039 Hypothyroidism, unspecified: Secondary | ICD-10-CM | POA: Diagnosis not present

## 2012-08-07 DIAGNOSIS — G43909 Migraine, unspecified, not intractable, without status migrainosus: Secondary | ICD-10-CM | POA: Insufficient documentation

## 2012-08-07 DIAGNOSIS — Z9089 Acquired absence of other organs: Secondary | ICD-10-CM

## 2012-08-07 DIAGNOSIS — N926 Irregular menstruation, unspecified: Secondary | ICD-10-CM

## 2012-08-07 DIAGNOSIS — Z8742 Personal history of other diseases of the female genital tract: Secondary | ICD-10-CM

## 2012-08-07 DIAGNOSIS — J4530 Mild persistent asthma, uncomplicated: Secondary | ICD-10-CM | POA: Insufficient documentation

## 2012-08-07 DIAGNOSIS — A630 Anogenital (venereal) warts: Secondary | ICD-10-CM

## 2012-08-07 DIAGNOSIS — J309 Allergic rhinitis, unspecified: Secondary | ICD-10-CM | POA: Insufficient documentation

## 2012-08-07 DIAGNOSIS — Z9049 Acquired absence of other specified parts of digestive tract: Secondary | ICD-10-CM

## 2012-08-07 LAB — HEMOGLOBIN A1C
Hgb A1c MFr Bld: 7.4 % — ABNORMAL HIGH (ref <5.7)
Mean Plasma Glucose: 166 mg/dL — ABNORMAL HIGH (ref <117)

## 2012-08-07 LAB — CBC WITH DIFFERENTIAL/PLATELET
Basophils Absolute: 0 10*3/uL (ref 0.0–0.1)
Basophils Relative: 0 % (ref 0–1)
Eosinophils Absolute: 0.9 10*3/uL — ABNORMAL HIGH (ref 0.0–0.7)
MCH: 27.5 pg (ref 26.0–34.0)
MCHC: 33.6 g/dL (ref 30.0–36.0)
Neutrophils Relative %: 63 % (ref 43–77)
Platelets: 325 10*3/uL (ref 150–400)

## 2012-08-07 LAB — COMPREHENSIVE METABOLIC PANEL
ALT: 25 U/L (ref 0–35)
AST: 23 U/L (ref 0–37)
Albumin: 3.8 g/dL (ref 3.5–5.2)
CO2: 26 mEq/L (ref 19–32)
Calcium: 9.4 mg/dL (ref 8.4–10.5)
Chloride: 99 mEq/L (ref 96–112)
Potassium: 4.8 mEq/L (ref 3.5–5.3)
Total Protein: 7.1 g/dL (ref 6.0–8.3)

## 2012-08-07 LAB — LIPID PANEL: Cholesterol: 325 mg/dL — ABNORMAL HIGH (ref 0–200)

## 2012-08-07 NOTE — Progress Notes (Signed)
Subjective:    Patient ID: [name], female    DOB: 1966-01-25, 46 y.o.   MRN: [id number]  HPI [name] is a new pt here for first visit.   She is on disability for PTSD.  PMH of PTSD, Depression, Diabetes, HTN, GERD,  Allergic rhinitis,  Asthma, Migraine headaches, genital warts and vaginismus .  She also describes she has a history of PCOS like syndrome and was evaluated by a reproductive endocrinologist and [name] [place] and was told she may have congenital adrenal hyperplasia  She has been having daily vagina spotting while on her [name] for the past month.  She has vaginismus as well and last pap was at [facility]  Pt would like to see a reproductive endocrinologist to assess her cortisol and issue of PCOS versus adrenal hyperplasia.  She reports she has been on thyroid med in the past but is not taking now  She also reports chronic diarrhea that is managed by her GI MD [name].  She had stopped her Metformin for her diabetes due to diarrhea.    Allergies  Allergen Reactions  . Latex Itching and Swelling  . Penicillins Other (See Comments)    dizzy   Past Medical History  Diagnosis Date  . Depression   . Anxiety   . Asthma   . Diabetes mellitus type II   . Insomnia   . PTSD (post-traumatic stress disorder)   . High blood pressure   . PCOS (polycystic ovarian syndrome)   . Thyroid disease   . IBS (irritable bowel syndrome)   . Food intolerance   . Seasonal allergies    Past Surgical History  Procedure Date  . Cholecystectomy    History   Social History  . Marital Status: Single    Spouse Name: N/A    Number of Children: N/A  . Years of Education: N/A   Occupational History  . Not on file.   Social History Main Topics  . Smoking status: Never Smoker   . Smokeless tobacco: Not on file  . Alcohol Use: No  . Drug Use: No  . Sexually Active: Not Currently    Birth Control/ Protection: Pill   Other Topics Concern  . Not on file    Social History Narrative  . No narrative on file   Family History  Problem Relation Age of Onset  . Bipolar disorder Mother   . Hypertension Mother   . Diabetes Mother   . Alzheimer's disease Father   . Hypertension Father   . Alzheimer's disease Maternal Grandmother    Patient Active Problem List  Diagnosis  . PTSD (post-traumatic stress disorder)  . Major depressive disorder, recurrent episode, severe, without mention of psychotic behavior  . Irregular menses  . Essential hypertension, benign  . History of abnormal Pap smear  . Diabetes 1.5, managed as type 2  . GERD (gastroesophageal reflux disease)  . Asthma  . History of migraine  . History of PCOS  . Genital warts  . S/P cholecystectomy  . Allergic rhinitis   Current Outpatient Prescriptions on File Prior to Visit  Medication Sig Dispense Refill  . atenolol (TENORMIN) 50 MG tablet       . cholestyramine (QUESTRAN) 4 G packet       . clonazePAM (KLONOPIN) 0.5 MG tablet TAKE ONE-HALF TABLET BY MOUTH AT BEDTIME  15 tablet  2  . fexofenadine (ALLEGRA) 180 MG tablet Take 180 mg by mouth daily.      Marland Kitchen  fluticasone (FLONASE) 50 MCG/ACT nasal spray       . irbesartan (AVAPRO) 75 MG tablet       . lansoprazole (PREVACID) 15 MG capsule Take 15 mg by mouth daily.      . naproxen (NAPROSYN) 500 MG tablet       . OCELLA 3-0.03 MG tablet       . sertraline (ZOLOFT) 100 MG tablet Take 2 tablets (200 mg total) by mouth daily.  180 tablet  1  . [DISCONTINUED] zolpidem (AMBIEN CR) 12.5 MG CR tablet Take 1 tablet (12.5 mg total) by mouth at bedtime as needed for sleep.  30 tablet  5  . levothyroxine (SYNTHROID, LEVOTHROID) 25 MCG tablet       . [DISCONTINUED] EFFEXOR XR 150 MG 24 hr capsule 375 mg daily.            Review of Systems See HPI    Objective:   Physical Exam Physical Exam  Nursing note and vitals reviewed.  Constitutional: She is oriented to person, place, and time. She appears well-developed and  well-nourished.  HENT:  Head: Normocephalic and atraumatic.  Cardiovascular: Normal rate and regular rhythm. Exam reveals no gallop and no friction rub.  No murmur heard.  Pulmonary/Chest: Breath sounds normal. She has no wheezes. She has no rales.  Neurological: She is alert and oriented to person, place, and time.  Skin: Skin is warm and dry.  Psychiatric: She has a normal mood and affect. Her behavior is normal.             Assessment & Plan:  Diabetes:  Will check AIc today  .  Ok to try to resume Metformin  Hypertension continue current meds  Diarrhea  Managed by her GI MD [name]  PTSD/anxiety /Depression  Managed by [name]  Vaginal spotting/vaginismus:  I spoke with [name] who will see pt for further evaluation  Possible PCOS vs Congenital adrenal hyperplasia  Will refer to reproductive endocrinologist at [facility]

## 2012-08-08 ENCOUNTER — Telehealth: Payer: Self-pay | Admitting: *Deleted

## 2012-08-08 ENCOUNTER — Ambulatory Visit (INDEPENDENT_AMBULATORY_CARE_PROVIDER_SITE_OTHER): Payer: Medicare Other | Admitting: Licensed Clinical Social Worker

## 2012-08-08 ENCOUNTER — Encounter (HOSPITAL_COMMUNITY): Payer: Self-pay | Admitting: Licensed Clinical Social Worker

## 2012-08-08 DIAGNOSIS — F332 Major depressive disorder, recurrent severe without psychotic features: Secondary | ICD-10-CM

## 2012-08-08 NOTE — Progress Notes (Signed)
   THERAPIST PROGRESS NOTE  Session Time: 10:30am-11:20am  Participation Level: Active  Behavioral Response: Well GroomedAlertEuthymic  Type of Therapy: Individual Therapy  Treatment Goals addressed: Coping  Interventions: CBT, DBT, Solution Focused, Strength-based, Supportive and Reframing  Summary: [name] is a 46 y.o. female who presents with euthymic mood and bright affect. she is doing well and is pleased that she has found classes to take in order to apply to medical school in the spring. She feels hopeful about her future for the first time in a long time and processes her excitement about moving to [facility] if she is admitted. She has already found housing options and is seeking fast solutions to all which will have to be done. She remains active and social. Her school work is going well and she is making very good grades. Overall, she is doing very well. The increase in her Zoloft from 150mg  to 200mg  has helped her anxiety and depression.   Suicidal/Homicidal: Nowithout intent/plan  Therapist Response: Assessed patients current functioning and reviewed progress. Reviewed coping strategies. Assessed patients safety and assisted in identifying protective factors.  Reviewed crisis plan with patient. Assisted patient with the expression of her feelings of excitement. Used CBT to assist patient with the identification of negative distortions and irrational thoughts. Encouraged patient to verbalize alternative and factual responses which challenge thought distortions. Used DBT to practice mindfulness, review distraction list and improve distress tolerance skills. Reviewed patients self care plan. Assessed  progress related to self care. Patient's self care is good. Recommend proper diet, regular exercise, socialization and recreation.Her PTSD symptoms are well controlled.   Plan: Return again in one weeks.  Diagnosis: Axis I: Major Depression, Recurrent severe    Axis II: No  diagnosis    Jari Carollo, [name] 08/08/2012

## 2012-08-08 NOTE — Telephone Encounter (Signed)
First available appt on 10/10/12. Will give pt information

## 2012-08-26 ENCOUNTER — Telehealth: Payer: Self-pay | Admitting: Internal Medicine

## 2012-08-26 DIAGNOSIS — E282 Polycystic ovarian syndrome: Secondary | ICD-10-CM | POA: Diagnosis not present

## 2012-08-26 DIAGNOSIS — I10 Essential (primary) hypertension: Secondary | ICD-10-CM | POA: Insufficient documentation

## 2012-08-26 DIAGNOSIS — R61 Generalized hyperhidrosis: Secondary | ICD-10-CM | POA: Diagnosis not present

## 2012-08-26 NOTE — Telephone Encounter (Signed)
Left message on voice mail to call office regarding labs

## 2012-08-27 DIAGNOSIS — R61 Generalized hyperhidrosis: Secondary | ICD-10-CM | POA: Diagnosis not present

## 2012-08-29 ENCOUNTER — Telehealth: Payer: Self-pay | Admitting: *Deleted

## 2012-08-29 ENCOUNTER — Ambulatory Visit (INDEPENDENT_AMBULATORY_CARE_PROVIDER_SITE_OTHER): Payer: Medicare Other | Admitting: Licensed Clinical Social Worker

## 2012-08-29 DIAGNOSIS — F332 Major depressive disorder, recurrent severe without psychotic features: Secondary | ICD-10-CM

## 2012-08-29 NOTE — Progress Notes (Signed)
   THERAPIST PROGRESS NOTE  Session Time: 10:30am-11:20am  Participation Level: Active  Behavioral Response: Well GroomedAlertAnxious  Type of Therapy: Individual Therapy  Treatment Goals addressed: Coping  Interventions: CBT, DBT, Strength-based, Supportive and Reframing  Summary: [name] is a 46 y.o. female who presents with euthymic mood and anxious affect. She comes into session with an ice pack on her elbow which she injured this morning while having an IBS attack as she rushed to the bathroom. She is tired and frustrated that her stomach is giving her problems. She has finished out the semester and received A's. 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 She is trying to refocus her attention and reframe her thinking to manage her anxiety. She remains active with friends and continues to take excellent care of herself. Her depression is well managed at this time. Her sleep and appetite are wnl.    Suicidal/Homicidal: Nowithout intent/plan  Therapist Response: Assessed patients current functioning and reviewed progress. Reviewed coping strategies. Assessed patients safety and assisted in identifying protective factors.  Reviewed crisis plan with patient. Assisted patient with the expression of her feelings of anxiety about school.. Used CBT to assist patient with the identification of negative distortions and irrational thoughts. Encouraged patient to verbalize alternative and factual responses which challenge thought distortions. Used DBT to practice mindfulness, review distraction list and improve distress tolerance skills. Explored ways to reduce her stress related to her future, medical school and debt. Used motivational interviewing to assist and encourage patient through the change process. Explored patients barriers to change. Reviewed  patients self care plan. Assessed  progress related to self care. Patient's self care is good . Recommend proper diet, regular exercise, socialization and recreation.   Plan: Return again in two weeks.  Diagnosis: Axis I: Major Depression, Recurrent severe    Axis II: No diagnosis    Carlena Ruybal, [name] 08/29/2012

## 2012-09-01 ENCOUNTER — Encounter: Payer: Self-pay | Admitting: Internal Medicine

## 2012-09-01 ENCOUNTER — Ambulatory Visit (INDEPENDENT_AMBULATORY_CARE_PROVIDER_SITE_OTHER): Payer: Medicare Other | Admitting: Internal Medicine

## 2012-09-01 VITALS — BP 110/74 | HR 74 | Temp 97.4°F | Resp 18 | Wt 173.0 lb

## 2012-09-01 DIAGNOSIS — Z8742 Personal history of other diseases of the female genital tract: Secondary | ICD-10-CM

## 2012-09-01 DIAGNOSIS — I10 Essential (primary) hypertension: Secondary | ICD-10-CM

## 2012-09-01 DIAGNOSIS — E139 Other specified diabetes mellitus without complications: Secondary | ICD-10-CM

## 2012-09-01 DIAGNOSIS — R197 Diarrhea, unspecified: Secondary | ICD-10-CM

## 2012-09-01 DIAGNOSIS — J309 Allergic rhinitis, unspecified: Secondary | ICD-10-CM | POA: Diagnosis not present

## 2012-09-01 DIAGNOSIS — E119 Type 2 diabetes mellitus without complications: Secondary | ICD-10-CM | POA: Diagnosis not present

## 2012-09-01 MED ORDER — FLUTICASONE PROPIONATE 50 MCG/ACT NA SUSP: 1.0000 | Freq: Every day | NASAL | Status: DC

## 2012-09-01 MED ORDER — ATENOLOL 50 MG PO TABS: 50.0000 mg | ORAL_TABLET | Freq: Every day | ORAL | Status: DC

## 2012-09-01 MED ORDER — IRBESARTAN 75 MG PO TABS: 75.0000 mg | ORAL_TABLET | Freq: Every day | ORAL | Status: DC

## 2012-09-01 MED ORDER — CHOLESTYRAMINE 4 G PO PACK: 1.0000 | PACK | Freq: Every day | ORAL | Status: DC

## 2012-09-01 NOTE — Patient Instructions (Addendum)
Keep next appt for CPE

## 2012-09-01 NOTE — Progress Notes (Signed)
Subjective:    Patient ID: [name], female    DOB: March 30, 1966, 46 y.o.   MRN: [id number]  HPI [name] is here for follow up on several issues  See labs.  She does not like to take Metformin as she is GI intolerant.  GI issues a challenge as she had intermittant diarrhea post cholecystectomy and uses cholestyramine once a day to help with this She reports she had had problems with lots of Diabetes meds.  Does not like Januvia.   She has seen reproductive endocrinologist at [facility]. Work up pending  See lipids  She is intolerant of statins.  She is hesitant to try Zetia as she does not know if it will cause GI issues.  She has slight anemia .    Allergies  Allergen Reactions  . Latex Itching and Swelling  . Penicillins Other (See Comments)    dizzy   Past Medical History  Diagnosis Date  . Depression   . Anxiety   . Asthma   . Diabetes mellitus type II   . Insomnia   . PTSD (post-traumatic stress disorder)   . High blood pressure   . PCOS (polycystic ovarian syndrome)   . Thyroid disease   . IBS (irritable bowel syndrome)   . Food intolerance   . Seasonal allergies    Past Surgical History  Procedure Date  . Cholecystectomy    History   Social History  . Marital Status: Single    Spouse Name: N/A    Number of Children: N/A  . Years of Education: N/A   Occupational History  . Not on file.   Social History Main Topics  . Smoking status: Never Smoker   . Smokeless tobacco: Not on file  . Alcohol Use: No  . Drug Use: No  . Sexually Active: Not Currently    Birth Control/ Protection: Pill   Other Topics Concern  . Not on file   Social History Narrative  . No narrative on file   Family History  Problem Relation Age of Onset  . Bipolar disorder Mother   . Hypertension Mother   . Diabetes Mother   . Alzheimer's disease Father   . Hypertension Father   . Alzheimer's disease Maternal Grandmother    Patient Active Problem List  Diagnosis  . PTSD  (post-traumatic stress disorder)  . Major depressive disorder, recurrent episode, severe, without mention of psychotic behavior  . Irregular menses  . Essential hypertension, benign  . History of abnormal Pap smear  . Diabetes 1.5, managed as type 2  . GERD (gastroesophageal reflux disease)  . Asthma  . History of migraine  . History of PCOS  . Genital warts  . S/P cholecystectomy  . Allergic rhinitis  . Diarrhea   Current Outpatient Prescriptions on File Prior to Visit  Medication Sig Dispense Refill  . atenolol (TENORMIN) 50 MG tablet       . cholestyramine (QUESTRAN) 4 G packet       . clonazePAM (KLONOPIN) 0.5 MG tablet TAKE ONE-HALF TABLET BY MOUTH AT BEDTIME  15 tablet  2  . fexofenadine (ALLEGRA) 180 MG tablet Take 180 mg by mouth daily.      . fluticasone (FLONASE) 50 MCG/ACT nasal spray       . irbesartan (AVAPRO) 75 MG tablet       . lansoprazole (PREVACID) 15 MG capsule Take 15 mg by mouth daily.      . naproxen (NAPROSYN) 500 MG tablet       .  OCELLA 3-0.03 MG tablet       . OVER THE COUNTER MEDICATION 1 capsule daily. Probiotic renew life      . Quercetin 250 MG TABS Take 500 mg by mouth 2 (two) times daily.      . sertraline (ZOLOFT) 100 MG tablet Take 2 tablets (200 mg total) by mouth daily.  180 tablet  1  . zolpidem (AMBIEN CR) 12.5 MG CR tablet Take 6.25 mg by mouth at bedtime as needed.      . [DISCONTINUED] EFFEXOR XR 150 MG 24 hr capsule 375 mg daily.            Review of Systems    see HPI Objective:   Physical Exam Physical Exam  Nursing note and vitals reviewed.  Constitutional: She is oriented to person, place, and time. She appears well-developed and well-nourished.  HENT:  Head: Normocephalic and atraumatic.  Cardiovascular: Normal rate and regular rhythm. Exam reveals no gallop and no friction rub.  No murmur heard.  Pulmonary/Chest: Breath sounds normal. She has no wheezes. She has no rales.  Neurological: She is alert and oriented to  person, place, and time.  Skin: Skin is warm and dry.  Psychiatric: She has a normal mood and affect. Her behavior is normal.  Ext:  No edema        Assessment & Plan:  Diabetes with multiple med intolerances  Will refer to endocrinology opinion.  Set up referral to Dr. Elvera Lennox.   Hyperlipidemia.  Likely familial syndrome given values.  She uses cholestyramine once a day to control diarrhea post cholecystectomy.  Will have endocrine address  HTN  Continue current meds  History of PCOS syndrome  She is seeing reproductive endocrinologist at [facility]  ???Abnormal thyroid blood studies.   She is off thyroid supplementation and her TSH is normal ..  Will continue to watch  See me at CPE

## 2012-09-01 NOTE — Telephone Encounter (Signed)
appt made

## 2012-09-03 ENCOUNTER — Ambulatory Visit: Payer: Self-pay | Admitting: Internal Medicine

## 2012-09-04 ENCOUNTER — Other Ambulatory Visit: Payer: Self-pay | Admitting: *Deleted

## 2012-09-04 MED ORDER — METFORMIN HCL 500 MG PO TABS: 500.0000 mg | ORAL_TABLET | Freq: Two times a day (BID) | ORAL | Status: DC

## 2012-09-04 NOTE — Telephone Encounter (Signed)
Refill request

## 2012-09-12 ENCOUNTER — Other Ambulatory Visit (HOSPITAL_COMMUNITY): Payer: Self-pay | Admitting: Psychiatry

## 2012-09-12 ENCOUNTER — Ambulatory Visit (INDEPENDENT_AMBULATORY_CARE_PROVIDER_SITE_OTHER): Payer: Medicare Other | Admitting: Licensed Clinical Social Worker

## 2012-09-12 DIAGNOSIS — F332 Major depressive disorder, recurrent severe without psychotic features: Secondary | ICD-10-CM | POA: Diagnosis not present

## 2012-09-12 MED ORDER — ZOLPIDEM TARTRATE ER 6.25 MG PO TBCR: 6.2500 mg | EXTENDED_RELEASE_TABLET | Freq: Every evening | ORAL | Status: DC | PRN

## 2012-09-12 NOTE — Progress Notes (Signed)
   THERAPIST PROGRESS NOTE  Session Time: 10:30am-11:20am  Participation Level: Active  Behavioral Response: Well GroomedAlertEuthymic  Type of Therapy: Individual Therapy  Treatment Goals addressed: Anxiety and Coping  Interventions: CBT, DBT, Strength-based, Supportive and Reframing  Summary: [name] is a 46 y.o. female who presents with euthymic mood and bright affect. She reports doing well, but has found the Christmas holiday annoying. She denies any feelings of depression, but does endorse anxiety about her potential plan to attend medical school. She is now considering the possibility of moving to [facility] even if she doesn't attend medical school. She is nervous about the debt she will have and remains uncertain if she can manage the sustained stress that accompanies medical school. She is now contemplating being a Associate Professor. She continues to socialize with her friends, but remains frustrated that she has been unable to successfully date. Her sleep and appetite are wnl.    Suicidal/Homicidal: Nowithout intent/plan  Therapist Response: Assessed patients current functioning and reviewed progress. Reviewed coping strategies. Assessed patients safety and assisted in identifying protective factors.  Reviewed crisis plan with patient. Assisted patient with the expression of her feelings of anxiety about medical school. Used CBT to assist patient with the identification of negative distortions and irrational thoughts. Encouraged patient to verbalize alternative and factual responses which challenge thought distortions. Used DBT to practice mindfulness, review distraction list and improve distress tolerance skills. Patients distress tolerance skills continue to improve. Explored her fear that moving again means she is running away. Reviewed patients self care plan. Assessed  progress related to self care. Patient's self care is good. Recommend proper diet, regular exercise,  socialization and recreation.   Plan: Return again in three weeks.  Diagnosis: Axis I: Major Depression, Recurrent severe    Axis II: No diagnosis    [name], [name] 09/12/2012

## 2012-09-19 ENCOUNTER — Ambulatory Visit: Payer: Self-pay | Admitting: Internal Medicine

## 2012-09-26 ENCOUNTER — Ambulatory Visit (HOSPITAL_COMMUNITY): Payer: Self-pay | Admitting: Psychiatry

## 2012-10-03 DIAGNOSIS — N920 Excessive and frequent menstruation with regular cycle: Secondary | ICD-10-CM | POA: Diagnosis not present

## 2012-10-03 DIAGNOSIS — N926 Irregular menstruation, unspecified: Secondary | ICD-10-CM | POA: Diagnosis not present

## 2012-10-03 DIAGNOSIS — N921 Excessive and frequent menstruation with irregular cycle: Secondary | ICD-10-CM | POA: Diagnosis not present

## 2012-10-10 ENCOUNTER — Encounter (HOSPITAL_COMMUNITY): Payer: Self-pay | Admitting: Licensed Clinical Social Worker

## 2012-10-10 ENCOUNTER — Ambulatory Visit (HOSPITAL_COMMUNITY): Payer: Self-pay | Admitting: Licensed Clinical Social Worker

## 2012-10-10 NOTE — Progress Notes (Signed)
Patient ID: [name], female   DOB: 03/16/1966, 47 y.o.   MRN: [id number] Patient cancelled late due to sickness.

## 2012-10-16 ENCOUNTER — Encounter: Payer: Self-pay | Admitting: Internal Medicine

## 2012-10-24 ENCOUNTER — Ambulatory Visit (INDEPENDENT_AMBULATORY_CARE_PROVIDER_SITE_OTHER): Payer: Medicare Other | Admitting: Licensed Clinical Social Worker

## 2012-10-24 DIAGNOSIS — F332 Major depressive disorder, recurrent severe without psychotic features: Secondary | ICD-10-CM

## 2012-10-24 NOTE — Progress Notes (Signed)
   THERAPIST PROGRESS NOTE  Session Time: 1:00pm-1:50pm  Participation Level: Active  Behavioral Response: Well GroomedAlertEuthymic  Type of Therapy: Individual Therapy  Treatment Goals addressed: Coping  Interventions: Strength-based, Supportive and Reframing  Summary: [name] is a 47 y.o. female who presents with euthymic mood and bright affect. She has dropped her classes and has decided not to pursue medical or PA school. She is not anxious or upset over this decision. She feels that has finally realized and accepted her limitations. Right now she is enjoying time to relax and spend time with friends. She is uncertain what she wants to pursue at this time but she would like to get a part time job. Her sleep and appetite are wnl.    Suicidal/Homicidal: Nowithout intent/plan  Therapist Response: Assessed patients current functioning and reviewed progress. Reviewed coping strategies. Assessed patients safety and assisted in identifying protective factors.  Reviewed crisis plan with patient. Assisted patient with the expression of her feelings of acceptance. Used DBT to practice mindfulness, review distraction list and improve distress tolerance skills. Reviewed patients self care plan. Assessed  progress related to self care. Patient's self care is good. Recommend proper diet, regular exercise, socialization and recreation.   Plan: Return again in four weeks.  Diagnosis: Axis I: Major Depression, Recurrent severe    Axis II: No diagnosis    Dewarren Ledbetter, [name] 10/24/2012

## 2012-11-03 DIAGNOSIS — N949 Unspecified condition associated with female genital organs and menstrual cycle: Secondary | ICD-10-CM | POA: Diagnosis not present

## 2012-11-07 ENCOUNTER — Ambulatory Visit (HOSPITAL_COMMUNITY): Payer: Self-pay | Admitting: Licensed Clinical Social Worker

## 2012-11-21 ENCOUNTER — Ambulatory Visit (HOSPITAL_COMMUNITY): Payer: Self-pay | Admitting: Licensed Clinical Social Worker

## 2012-12-05 ENCOUNTER — Encounter (HOSPITAL_COMMUNITY): Payer: Self-pay | Admitting: Licensed Clinical Social Worker

## 2012-12-05 ENCOUNTER — Ambulatory Visit (HOSPITAL_COMMUNITY): Payer: Self-pay | Admitting: Licensed Clinical Social Worker

## 2012-12-05 NOTE — Progress Notes (Signed)
Patient ID: [name], female   DOB: [birth year], 47 y.o.   MRN: [id number] Patient was a no show/no call for appointment. Called and left a a message.

## 2012-12-15 ENCOUNTER — Other Ambulatory Visit (HOSPITAL_COMMUNITY): Payer: Self-pay | Admitting: Psychiatry

## 2012-12-15 ENCOUNTER — Ambulatory Visit (INDEPENDENT_AMBULATORY_CARE_PROVIDER_SITE_OTHER): Payer: Medicare Other | Admitting: Licensed Clinical Social Worker

## 2012-12-15 DIAGNOSIS — F332 Major depressive disorder, recurrent severe without psychotic features: Secondary | ICD-10-CM | POA: Diagnosis not present

## 2012-12-15 MED ORDER — ZOLPIDEM TARTRATE ER 6.25 MG PO TBCR: 6.2500 mg | EXTENDED_RELEASE_TABLET | Freq: Every evening | ORAL | Status: DC | PRN

## 2012-12-15 NOTE — Progress Notes (Signed)
   THERAPIST PROGRESS NOTE  Session Time: 9:30am-10:20am  Participation Level: Active  Behavioral Response: Well GroomedDrowsyEuthymic  Type of Therapy: Individual Therapy  Treatment Goals addressed: Coping  Interventions: Motivational Interviewing, Strength-based, Supportive and Reframing  Summary: [name] is a 47 y.o. female who presents with euthymic mood and fatigued affect. She reports missing her last session because she forgot which is very unlike her. Since she has stopped school, she has been spending the majority of her time at home recovering from vaginal bleeding that lasted six weeks. She remains exhausted and is sleeping a lot. She denies feeling depressed or anxious, but does endorse feeling lonely and knows she needs to decided how to get more social interaction. Her best friend is moving to [place] in one month and she processes her sadness over this loss. She questions if staying in [place] is the best thing for her. She questions what she is going to do with her life now that she is no longer in school. 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 Overall, she is doing well.    Suicidal/Homicidal: Nowithout intent/plan  Therapist Response: Assessed patients current functioning and reviewed progress. Reviewed coping strategies. Assessed patients safety and assisted in identifying protective factors.  Reviewed crisis plan with patient. Assisted patient with the expression of sadness over her friend leaving. Reviewed patients self care plan. Assessed progress related to self care. Patients self care is good. Recommend daily exercise, increased socialization and recreation. Processed and normalized patients grief reaction. Used CBT to assist patient with the identification of negative distortions and irrational thoughts. Encouraged patient to verbalize alternative and factual responses which challenge thought distortions. Used motivational interviewing to assist  and encourage patient through the change process. Explored patients barriers to change.   Plan: Return again in two to three weeks.  Diagnosis: Axis I: Major Depression, Recurrent severe    Axis II: No diagnosis    Marquett Bertoli, [name] 12/15/2012

## 2012-12-22 ENCOUNTER — Ambulatory Visit (INDEPENDENT_AMBULATORY_CARE_PROVIDER_SITE_OTHER): Payer: Medicare Other | Admitting: Psychiatry

## 2012-12-22 ENCOUNTER — Encounter (HOSPITAL_COMMUNITY): Payer: Self-pay | Admitting: Psychiatry

## 2012-12-22 VITALS — BP 118/75 | Ht 62.0 in | Wt 171.0 lb

## 2012-12-22 DIAGNOSIS — F332 Major depressive disorder, recurrent severe without psychotic features: Secondary | ICD-10-CM

## 2012-12-22 DIAGNOSIS — F329 Major depressive disorder, single episode, unspecified: Secondary | ICD-10-CM | POA: Diagnosis not present

## 2012-12-22 DIAGNOSIS — F4310 Post-traumatic stress disorder, unspecified: Secondary | ICD-10-CM

## 2012-12-22 MED ORDER — CLONAZEPAM 0.5 MG PO TABS: ORAL_TABLET | ORAL | Status: DC

## 2012-12-22 NOTE — Progress Notes (Signed)
[facility] Follow-up Outpatient Visit  [name] December 09, 1965   Subjective: The patient is a 47 year old female who has been followed by [facility] since November of 2010. She is currently diagnosed with PTSD along with major depressive disorder. At her last appointment, I did not make any changes. She presents today. I have not seen her since October. The patient reports he went through of patch in December and January. She became perimenopausal for approximately 6 weeks. She is now on a different birth control pill, and her menstrual cycle finally cleared. She has extremely low energy and no motivation. She did not attend any spring classes. She still trying to figure out her life's future. She has not been attending hospice because she has to find replacements that she's not able to attend. She continues to be involved in a singles group and is being social. She did go to [place] to visit her grandmother in October. She had a wonderful time. Her grandmother died in 11/01/22. She got to meet distant family. The patient feels that she's doing very well. Her anxiety is much less that she does not have the pressure at school. She is sleeping well. She has trust her and began CR to 6.25 mg. She only takes one quarter of a 0.5 Klonopin at bedtime. She is continued her Zoloft.  Filed Vitals:   12/22/12 1122  BP: 118/75   Active Ambulatory Problems    Diagnosis Date Noted  . PTSD (post-traumatic stress disorder) 11/02/2011  . Major depressive disorder, recurrent episode, severe, without mention of psychotic behavior 12/06/2011  . Irregular menses 08/07/2012  . Essential hypertension, benign 08/07/2012  . History of abnormal Pap smear 08/07/2012  . 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 GERD (gastroesophageal reflux disease) 08/07/2012  . Asthma 08/07/2012  . History of migraine 08/07/2012  . History of PCOS 08/07/2012  . Genital warts 08/07/2012  . S/P  cholecystectomy 08/07/2012  . Allergic rhinitis 08/07/2012  . Diarrhea 09/01/2012   Resolved Ambulatory Problems    Diagnosis Date Noted  . No Resolved Ambulatory Problems   Past Medical History  Diagnosis Date  . Depression   . Anxiety   . Asthma   . Diabetes mellitus type II   . Insomnia   . High blood pressure   . PCOS (polycystic ovarian syndrome)   . Thyroid disease   . IBS (irritable bowel syndrome)   . Food intolerance   . Seasonal allergies    Current Outpatient Prescriptions on File Prior to Visit  Medication Sig Dispense Refill  . atenolol (TENORMIN) 50 MG tablet Take 1 tablet (50 mg total) by mouth daily.  90 tablet  1  . cholestyramine (QUESTRAN) 4 G packet Take 1 packet by mouth daily.  60 each  1  . fexofenadine (ALLEGRA) 180 MG tablet Take 180 mg by mouth daily.      . fluticasone (FLONASE) 50 MCG/ACT nasal spray Place 1 spray into the nose daily.  16 g  2  . irbesartan (AVAPRO) 75 MG tablet Take 1 tablet (75 mg total) by mouth daily.  90 tablet  1  . lansoprazole (PREVACID) 15 MG capsule Take 15 mg by mouth daily.      . metFORMIN (GLUCOPHAGE) 500 MG tablet Take 1 tablet (500 mg total) by mouth 2 (two) times daily with a meal.  180 tablet  3  . naproxen (NAPROSYN) 500 MG tablet       . OCELLA  3-0.03 MG tablet       . OVER THE COUNTER MEDICATION 1 capsule daily. Probiotic renew life      . Quercetin 250 MG TABS Take 500 mg by mouth 2 (two) times daily.      . sertraline (ZOLOFT) 100 MG tablet Take 2 tablets (200 mg total) by mouth daily.  180 tablet  1  . zolpidem (AMBIEN CR) 6.25 MG CR tablet Take 1 tablet (6.25 mg total) by mouth at bedtime as needed.  30 tablet  2  . [DISCONTINUED] EFFEXOR XR 150 MG 24 hr capsule 375 mg daily.        No current facility-administered medications on file prior to visit.   Review of Systems - General ROS: negative for - sleep disturbance or weight gain Psychological ROS: negative for - anxiety or depression Cardiovascular  ROS: no chest pain or dyspnea on exertion Neurological ROS: negative for - confusion, headaches or seizures  Mental Status Examination  Appearance: Casual Alert: Yes Attention: good  Cooperative: Yes Eye Contact: Good Speech: Regular rate rhythm and volume Psychomotor Activity: Normal Memory/Concentration: Intact Oriented: person, place, time/date and situation Mood: Euthymic Affect: Congruent Thought Processes and Associations: Logical Fund of Knowledge: Fair Thought Content: No suicidal or homicidal thought Insight: Fair Judgement: Fair  Diagnosis: PTSD, MDD  Treatment Plan: I will not make any changes today.  I will see her back in 3 months. Patient to call with concerns.   Jamse Mead, MD

## 2012-12-23 ENCOUNTER — Ambulatory Visit (HOSPITAL_COMMUNITY): Payer: Self-pay | Admitting: Licensed Clinical Social Worker

## 2012-12-31 ENCOUNTER — Telehealth: Payer: Self-pay | Admitting: Internal Medicine

## 2012-12-31 ENCOUNTER — Other Ambulatory Visit: Payer: Self-pay | Admitting: *Deleted

## 2012-12-31 NOTE — Telephone Encounter (Signed)
Pt would like to have the following meds sent to [facility] [address]... cholestyramine (QUESTRAN) 4 G packet lansoprazole (PREVACID) 15 MG capsule (SHE NEEDS THIS FOR TWICE A DAY 30 MG) PER PT... IF THERE IS ANY CONCERN PLS CALL PT AT [phone]... PT IS AWARE THESE MEDS CAME FROM HER GI DOC BUT WOULD LIKE DR. DEB TO PRESCRIBE THEM.Marland KitchenMarland Kitchen

## 2012-12-31 NOTE — Telephone Encounter (Signed)
Refill request See Ardenia's note

## 2013-01-01 ENCOUNTER — Telehealth: Payer: Self-pay | Admitting: Internal Medicine

## 2013-01-01 MED ORDER — LANSOPRAZOLE 15 MG PO CPDR: 15.0000 mg | DELAYED_RELEASE_CAPSULE | Freq: Every day | ORAL | Status: DC

## 2013-01-01 MED ORDER — CHOLESTYRAMINE 4 G PO PACK: 1.0000 | PACK | Freq: Every day | ORAL | Status: DC

## 2013-01-01 MED ORDER — LANSOPRAZOLE 30 MG PO CPDR: 30.0000 mg | DELAYED_RELEASE_CAPSULE | Freq: Every day | ORAL | Status: DC

## 2013-01-01 NOTE — Telephone Encounter (Signed)
Pt would like to have 30 mg lansoprazole (PREVACID) 15 MG capsule , but she states what ever dosage she needs please make it twice a day... [phone]

## 2013-01-01 NOTE — Telephone Encounter (Signed)
[name]  Check with pharmacy.  She has GI MD dr. Loreta Ave for chronic diarrhea who I believe is prescribed her Questran.   I sent in a refill for her Prevacid until she sees me in May

## 2013-01-01 NOTE — Telephone Encounter (Signed)
Refill request

## 2013-01-05 ENCOUNTER — Other Ambulatory Visit: Payer: Self-pay | Admitting: *Deleted

## 2013-01-05 MED ORDER — LANSOPRAZOLE 15 MG PO CPDR: 15.0000 mg | DELAYED_RELEASE_CAPSULE | Freq: Two times a day (BID) | ORAL | Status: DC

## 2013-01-12 ENCOUNTER — Ambulatory Visit (INDEPENDENT_AMBULATORY_CARE_PROVIDER_SITE_OTHER): Payer: Medicare Other | Admitting: Licensed Clinical Social Worker

## 2013-01-12 DIAGNOSIS — F4310 Post-traumatic stress disorder, unspecified: Secondary | ICD-10-CM

## 2013-01-12 DIAGNOSIS — F332 Major depressive disorder, recurrent severe without psychotic features: Secondary | ICD-10-CM | POA: Diagnosis not present

## 2013-01-12 NOTE — Progress Notes (Signed)
   THERAPIST PROGRESS NOTE  Session Time: 9:30am-10:20am  Participation Level: Active  Behavioral Response: Well GroomedAlertEuthymic  Type of Therapy: Individual Therapy  Treatment Goals addressed: Coping  Interventions: CBT, DBT, Strength-based, Supportive and Reframing  Summary: [name] is a 47 y.o. female who presents with euthymic mood and bright affect. She reports some mild depression and anxiety related to her best friend moving to [place] in a few weeks. She processes her grief related to this and explores her sadness. She is adjusting to not being in school any more and wants to pursue a part time job, but has been bothered with an increase in IBS symptoms. She is frustrated by her limited food choices, but she continues to try new things. She is not lonely and maintains friendships to increase her socialization which she knows is helpful to her depression. Her PTSD symptoms are well controlled. Her sleep and appetite are wnl.    Suicidal/Homicidal: Nowithout intent/plan  Therapist Response: Assessed patients current functioning and reviewed progress. Reviewed coping strategies. Assessed patients safety and assisted in identifying protective factors.  Reviewed crisis plan with patient. Assisted patient with the expression of frustration with her body. Reviewed patients self care plan. Assessed progress related to self care. Patients self care is good. Recommend daily exercise, increased socialization and recreation. Processed and normalized patients grief reaction. Used CBT to assist patient with the identification of negative distortions and irrational thoughts. Encouraged patient to verbalize alternative and factual responses which challenge thought distortions. Used DBT to practice mindfulness, review distraction list and improve distress tolerance skills.   Plan: Return again in two weeks.  Diagnosis: Axis I: Major Depression, Recurrent severe    Axis II: No  diagnosis    Essex Perry, [name] 01/12/2013

## 2013-01-19 ENCOUNTER — Other Ambulatory Visit (HOSPITAL_COMMUNITY): Payer: Self-pay | Admitting: Psychiatry

## 2013-01-21 ENCOUNTER — Encounter: Payer: Medicare Other | Admitting: Internal Medicine

## 2013-01-21 DIAGNOSIS — N921 Excessive and frequent menstruation with irregular cycle: Secondary | ICD-10-CM | POA: Diagnosis not present

## 2013-01-22 NOTE — Progress Notes (Signed)
  Subjective:    Patient ID: [name], female    DOB: 12-23-1965, 47 y.o.   MRN: [id number]  HPI  Pt rescheduled   Review of Systems     Objective:   Physical Exam        Assessment & Plan:

## 2013-01-26 ENCOUNTER — Ambulatory Visit (HOSPITAL_COMMUNITY): Payer: Self-pay | Admitting: Licensed Clinical Social Worker

## 2013-02-10 ENCOUNTER — Other Ambulatory Visit: Payer: Self-pay | Admitting: Internal Medicine

## 2013-02-10 NOTE — Telephone Encounter (Signed)
Refill request

## 2013-02-16 ENCOUNTER — Ambulatory Visit (INDEPENDENT_AMBULATORY_CARE_PROVIDER_SITE_OTHER): Payer: Medicare Other | Admitting: Licensed Clinical Social Worker

## 2013-02-16 DIAGNOSIS — F4310 Post-traumatic stress disorder, unspecified: Secondary | ICD-10-CM | POA: Diagnosis not present

## 2013-02-16 DIAGNOSIS — F332 Major depressive disorder, recurrent severe without psychotic features: Secondary | ICD-10-CM

## 2013-02-16 NOTE — Progress Notes (Signed)
   THERAPIST PROGRESS NOTE  Session Time: 10:30am-11:20am  Participation Level: Active  Behavioral Response: Well GroomedAlertEuthymic  Type of Therapy: Individual Therapy  Treatment Goals addressed: Coping  Interventions: CBT, DBT, Strength-based, Supportive and Reframing  Summary: [name] is a 47 y.o. female who presents with euthymic mood and bright affect. She reports that she continues to feel well, with improved and sustained mood stability, well controlled anxiety and an absence of PTSD symptoms. 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 She finds purpose and meaning, feels loved by these seniors and feels that her need for a motherly connection is being tended to. She is making money and wants to pursue this as an ongoing source of employment. She continues to experience stomach problems with her IBS and is frustrated by her limited diet. Her sleep and appetite are wnl. She denies any binge eating.    Suicidal/Homicidal: Nowithout intent/plan  Therapist Response: Assessed patients current functioning and reviewed progress. Reviewed coping strategies. Assessed patients safety and assisted in identifying protective factors.  Reviewed crisis plan with patient. Assisted patient with the expression of frustration over her chronic illnesses. . Reviewed patients self care plan. Assessed progress related to self care. Patients self care is very good. Recommend daily exercise, increased socialization and recreation. Reviewed healthy boundaries and assertive communication. Used DBT to practice mindfulness, review distraction list and improve distress tolerance skills. Used CBT to assist patient with the identification of negative distortions and irrational thoughts. Encouraged patient to verbalize alternative and factual responses which challenge thought distortions. Reviewed and updated her treatment plans.   Plan: Return again  in four weeks.  Diagnosis: Axis I: Major Depression, Recurrent severe    Axis II: No diagnosis    Jibreel Fedewa, [name] 02/16/2013

## 2013-02-26 ENCOUNTER — Encounter: Payer: Self-pay | Admitting: Internal Medicine

## 2013-02-26 ENCOUNTER — Ambulatory Visit (INDEPENDENT_AMBULATORY_CARE_PROVIDER_SITE_OTHER): Payer: Medicare Other | Admitting: Internal Medicine

## 2013-02-26 VITALS — BP 144/88 | HR 80 | Temp 97.1°F | Resp 18 | Wt 168.0 lb

## 2013-02-26 DIAGNOSIS — I10 Essential (primary) hypertension: Secondary | ICD-10-CM | POA: Diagnosis not present

## 2013-02-26 DIAGNOSIS — E119 Type 2 diabetes mellitus without complications: Secondary | ICD-10-CM | POA: Diagnosis not present

## 2013-02-26 DIAGNOSIS — F332 Major depressive disorder, recurrent severe without psychotic features: Secondary | ICD-10-CM | POA: Diagnosis not present

## 2013-02-26 DIAGNOSIS — E139 Other specified diabetes mellitus without complications: Secondary | ICD-10-CM

## 2013-02-26 LAB — LIPID PANEL
Cholesterol: 292 mg/dL — ABNORMAL HIGH (ref 0–200)
HDL: 98 mg/dL (ref >39)
Triglycerides: 211 mg/dL — ABNORMAL HIGH (ref <150)

## 2013-02-26 LAB — CBC WITH DIFFERENTIAL/PLATELET
Basophils Absolute: 0 10*3/uL (ref 0.0–0.1)
Basophils Relative: 0 % (ref 0–1)
Eosinophils Relative: 9 % — ABNORMAL HIGH (ref 0–5)
HCT: 33.8 % — ABNORMAL LOW (ref 36.0–46.0)
MCH: 27.9 pg (ref 26.0–34.0)
MCHC: 34 g/dL (ref 30.0–36.0)
MCV: 82 fL (ref 78.0–100.0)
Monocytes Absolute: 0.4 10*3/uL (ref 0.1–1.0)
Monocytes Relative: 4 % (ref 3–12)
RDW: 15.6 % — ABNORMAL HIGH (ref 11.5–15.5)

## 2013-02-26 LAB — COMPREHENSIVE METABOLIC PANEL
Alkaline Phosphatase: 74 U/L (ref 39–117)
BUN: 9 mg/dL (ref 6–23)
Glucose, Bld: 156 mg/dL — ABNORMAL HIGH (ref 70–99)
Total Bilirubin: 0.2 mg/dL — ABNORMAL LOW (ref 0.3–1.2)

## 2013-02-26 LAB — HEMOGLOBIN A1C
Hgb A1c MFr Bld: 7.1 % — ABNORMAL HIGH (ref <5.7)
Mean Plasma Glucose: 157 mg/dL — ABNORMAL HIGH (ref <117)

## 2013-02-26 NOTE — Progress Notes (Signed)
Subjective:    Patient ID: [name], female    DOB: 1966-09-01, 47 y.o.   MRN: [id number]  HPI Herta is here for follow up.  I have not seen her in quite a while.  She missed her CPE appt in May  She saw Dr. Janeann Merl  at [facility] but was not happy with the visit.   She was sent for eval of possible CAH.   See scanned media note  She is off her Metformin as it gives her diarrhea.  She has tried multiple diabetes meds and has various intolerances to oral meds.  She is wondering if Invokana is appropriate for her.  She does have problems with multiple vaginal yeast infections in the past.    She reports her fasting home glucoses range 150-155  See BP   She tells me she is taking  Her Avapro but only using her Tenormin intermittantly .  She states her tenormin helps her anxiety and  panic attacks.   She did see Dr. Vincente Poli who changed her OC  ??? Neocon (pt unsure).  Her menorhagia has improved some  Allergies  Allergen Reactions  . Latex Itching and Swelling  . Penicillins Other (See Comments)    dizzy   Past Medical History  Diagnosis Date  . Depression   . Anxiety   . Asthma   . Diabetes mellitus type II   . Insomnia   . PTSD (post-traumatic stress disorder)   . High blood pressure   . PCOS (polycystic ovarian syndrome)   . Thyroid disease   . IBS (irritable bowel syndrome)   . Food intolerance   . Seasonal allergies    Past Surgical History  Procedure Laterality Date  . Cholecystectomy     History   Social History  . Marital Status: Single    Spouse Name: N/A    Number of Children: N/A  . Years of Education: N/A   Occupational History  . Not on file.   Social History Main Topics  . Smoking status: Never Smoker   . Smokeless tobacco: Not on file  . Alcohol Use: No  . Drug Use: No  . Sexually Active: Not Currently    Birth Control/ Protection: Pill   Other Topics Concern  . Not on file   Social History Narrative  . No narrative on file   Family History   Problem Relation Age of Onset  . Bipolar disorder Mother   . Hypertension Mother   . Diabetes Mother   . Alzheimer's disease Father   . Hypertension Father   . Alzheimer's disease Maternal Grandmother    Patient Active Problem List   Diagnosis Date Noted  . Diarrhea 09/01/2012  . Irregular menses 08/07/2012  . Essential hypertension, benign 08/07/2012  . History of abnormal Pap smear 08/07/2012  . 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 GERD (gastroesophageal reflux disease) 08/07/2012  . Asthma 08/07/2012  . History of migraine 08/07/2012  . History of PCOS 08/07/2012  . Genital warts 08/07/2012  . S/P cholecystectomy 08/07/2012  . Allergic rhinitis 08/07/2012  . Major depressive disorder, recurrent episode, severe, without mention of psychotic behavior 12/06/2011  . PTSD (post-traumatic stress disorder) 11/02/2011   Current Outpatient Prescriptions on File Prior to Visit  Medication Sig Dispense Refill  . cholestyramine (QUESTRAN) 4 G packet Take 1 packet by mouth daily.  60 each  1  . clonazePAM (KLONOPIN) 0.5 MG tablet TAKE ONE-HALF TABLET BY MOUTH AT BEDTIME  15  tablet  2  . fexofenadine (ALLEGRA) 180 MG tablet Take 180 mg by mouth daily.      . fluticasone (FLONASE) 50 MCG/ACT nasal spray Place 1 spray into the nose daily.  16 g  2  . irbesartan (AVAPRO) 75 MG tablet Take 1 tablet (75 mg total) by mouth daily.  90 tablet  1  . lansoprazole (PREVACID) 30 MG capsule TAKE 1 CAPSULE (30 MG TOTAL) BY MOUTH DAILY.  30 capsule  1  . naproxen (NAPROSYN) 500 MG tablet       . OVER THE COUNTER MEDICATION 1 capsule daily. Probiotic renew life      . Quercetin 250 MG TABS Take 500 mg by mouth 2 (two) times daily.      . sertraline (ZOLOFT) 100 MG tablet TAKE 2 TABLETS BY MOUTH EVERY DAY  180 tablet  1  . zolpidem (AMBIEN CR) 6.25 MG CR tablet Take 1 tablet (6.25 mg total) by mouth at bedtime as needed.  30 tablet  2  . [DISCONTINUED] EFFEXOR XR 150 MG 24 hr capsule 375 mg  daily.        No current facility-administered medications on file prior to visit.         Review of Systems See HPI    Objective:   Physical Exam Physical Exam  Nursing note and vitals reviewed.  Constitutional: She is oriented to person, place, and time. She appears well-developed and well-nourished.  HENT:  Head: Normocephalic and atraumatic.  Cardiovascular: Normal rate and regular rhythm. Exam reveals no gallop and no friction rub.  No murmur heard.  Pulmonary/Chest: Breath sounds normal. She has no wheezes. She has no rales.  Neurological: She is alert and oriented to person, place, and time.  Skin: Skin is warm and dry.  Psychiatric: She has a normal mood and affect. Her behavior is normal.         Assessment & Plan:  HTN:  Advised pt to take tenormin 50 mg daily with her Avapro  Diabetes  Multiple intolerances  May be a candidate for Invokana  Despite history of chronic vaginitis  Will get endocrinology opinion.   Menorrhagia    Continue OC  She is to schedule pap with Dr Vincente Poli when not bleeding  Reschedule CPE

## 2013-02-26 NOTE — Patient Instructions (Addendum)
Reschedule CPE  Take your tenormin 50 mg daily along with you AVApro

## 2013-03-02 ENCOUNTER — Telehealth: Payer: Self-pay | Admitting: Internal Medicine

## 2013-03-02 ENCOUNTER — Ambulatory Visit (HOSPITAL_COMMUNITY): Payer: Self-pay | Admitting: Licensed Clinical Social Worker

## 2013-03-02 MED ORDER — ALBUTEROL SULFATE HFA 108 (90 BASE) MCG/ACT IN AERS: INHALATION_SPRAY | RESPIRATORY_TRACT | Status: DC

## 2013-03-02 NOTE — Telephone Encounter (Signed)
Spoke with pt and informed of lab results.    Minimal change on Questran in her lipid profile   See lipids  She is on questran and advised to discuss cholesterol med with Dr. Elvera Lennox

## 2013-03-04 ENCOUNTER — Other Ambulatory Visit: Payer: Self-pay | Admitting: Internal Medicine

## 2013-03-04 NOTE — Telephone Encounter (Signed)
Refill request

## 2013-03-18 ENCOUNTER — Ambulatory Visit (INDEPENDENT_AMBULATORY_CARE_PROVIDER_SITE_OTHER): Payer: Medicare Other | Admitting: Licensed Clinical Social Worker

## 2013-03-18 DIAGNOSIS — F332 Major depressive disorder, recurrent severe without psychotic features: Secondary | ICD-10-CM

## 2013-03-18 NOTE — Progress Notes (Signed)
   THERAPIST PROGRESS NOTE  Session Time: 11:30am-12:20pm  Participation Level: Active  Behavioral Response: Well GroomedAlertEuthymic  Type of Therapy: Individual Therapy  Treatment Goals addressed: Coping  Interventions: CBT, Strength-based, Supportive and Reframing  Summary: [name] is a 47 y.o. female who presents with euthymic mood and bright affect. she reports feeling well, with well controlled mood stability and anxiety. Her PTSD symptoms have resolved through CBT and DBT. She is able to carry on a productive life which she reports feels wonderful. She is content. She processes her grief related to the death of her care receiver and explores how she felt useful caring for her in the end stages of her life. She is now looking for new employment. Her sleep and appetite are wnl.    Suicidal/Homicidal: Nowithout intent/plan  Therapist Response: Assessed patients current functioning and reviewed progress. Reviewed coping strategies. Assessed patients safety and assisted in identifying protective factors.  Reviewed crisis plan with patient. . Reviewed patients self care plan. Assessed progress related to self care. Patients self care is very good. Recommend daily exercise, increased socialization and recreation. Reviewed healthy boundaries and assertive communication. Used CBT to assist patient with the identification of negative distortions and irrational thoughts. Encouraged patient to verbalize alternative and factual responses which challenge thought distortions. Used DBT to practice mindfulness, review distraction list and improve distress tolerance skills.   Plan: Return again in four weeks.  Diagnosis: Axis I: Major Depression, Recurrent severe    Axis II: No diagnosis    Raya Mckinstry, [name] 03/18/2013

## 2013-03-23 ENCOUNTER — Ambulatory Visit (INDEPENDENT_AMBULATORY_CARE_PROVIDER_SITE_OTHER): Payer: Medicare Other | Admitting: Psychiatry

## 2013-03-23 ENCOUNTER — Encounter (HOSPITAL_COMMUNITY): Payer: Self-pay | Admitting: Psychiatry

## 2013-03-23 VITALS — BP 102/68 | Ht 62.0 in | Wt 170.0 lb

## 2013-03-23 DIAGNOSIS — F4310 Post-traumatic stress disorder, unspecified: Secondary | ICD-10-CM | POA: Diagnosis not present

## 2013-03-23 DIAGNOSIS — F332 Major depressive disorder, recurrent severe without psychotic features: Secondary | ICD-10-CM

## 2013-03-23 DIAGNOSIS — F331 Major depressive disorder, recurrent, moderate: Secondary | ICD-10-CM

## 2013-03-23 NOTE — Progress Notes (Signed)
[facility] Follow-up Outpatient Visit  [name] 1965-09-29   Subjective: The patient is a 47 year old female who has been followed by [facility] since November of 2010. She is currently diagnosed with PTSD along with major depressive disorder. At her last appointment, I did not make any changes. She presents today. 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 She would work 10-16 hours a week. She continues to see Belenda Cruise for therapy. The patient has been helping out a neighbor who does not have a car. She is attending her single group. She spending time with her animals. 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 If she could take one class at a time, she feels that she can manage it. The patient is under much less stress. She is no longer taking her Ambien. She feels she no longer needs it for sleep. She does take a third Klonopin daily. Overall she feels like she is doing very well. Her energy and motivation are better. She is practicing her coping skills.  Filed Vitals:   03/23/13 1458  BP: 102/68   Active Ambulatory Problems    Diagnosis Date Noted  . PTSD (post-traumatic stress disorder) 11/02/2011  . Major depressive disorder, recurrent episode, severe, without mention of psychotic behavior 12/06/2011  . Irregular menses 08/07/2012  . Essential hypertension, benign 08/07/2012  . History of abnormal Pap smear 08/07/2012  . 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 GERD (gastroesophageal reflux disease) 08/07/2012  . Asthma 08/07/2012  . History of migraine 08/07/2012  . History of PCOS 08/07/2012  . Genital warts 08/07/2012  . S/P cholecystectomy 08/07/2012  . Allergic rhinitis 08/07/2012  . Diarrhea 09/01/2012  . Essential hypertension 08/26/2012   Resolved Ambulatory Problems    Diagnosis Date  Noted  . No Resolved Ambulatory Problems   Past Medical History  Diagnosis Date  . Depression   . Anxiety   . Asthma   . Diabetes mellitus type II   . Insomnia   . High blood pressure   . PCOS (polycystic ovarian syndrome)   . Thyroid disease   . IBS (irritable bowel syndrome)   . Food intolerance   . Seasonal allergies    Current Outpatient Prescriptions on File Prior to Visit  Medication Sig Dispense Refill  . albuterol (PROVENTIL HFA;VENTOLIN HFA) 108 (90 BASE) MCG/ACT inhaler Use 2 inhalations tid as needed for wheezing  3 Inhaler  0  . atenolol (TENORMIN) 50 MG tablet Take 50 mg by mouth daily. Pt is taking this as a PRN medication she states up to 4 a day for BP and anxiety      . cholestyramine (QUESTRAN) 4 G packet Take 1 packet by mouth daily.  60 each  1  . clonazePAM (KLONOPIN) 0.5 MG tablet TAKE ONE-HALF TABLET BY MOUTH AT BEDTIME  15 tablet  2  . fexofenadine (ALLEGRA) 180 MG tablet Take 180 mg by mouth daily.      . fluticasone (FLONASE) 50 MCG/ACT nasal spray PLACE 1 SPRAY INTO THE NOSE DAILY.  16 g  1  . irbesartan (AVAPRO) 75 MG tablet Take 1 tablet (75 mg total) by mouth daily.  90 tablet  1  . lansoprazole (PREVACID) 30 MG capsule TAKE 1 CAPSULE (30 MG TOTAL) BY MOUTH DAILY.  30 capsule  1  . naproxen (NAPROSYN)  500 MG tablet       . OVER THE COUNTER MEDICATION 1 capsule daily. Probiotic renew life      . Quercetin 250 MG TABS Take 500 mg by mouth 2 (two) times daily.      . sertraline (ZOLOFT) 100 MG tablet TAKE 2 TABLETS BY MOUTH EVERY DAY  180 tablet  1  . zolpidem (AMBIEN CR) 6.25 MG CR tablet Take 1 tablet (6.25 mg total) by mouth at bedtime as needed.  30 tablet  2  . [DISCONTINUED] EFFEXOR XR 150 MG 24 hr capsule 375 mg daily.        No current facility-administered medications on file prior to visit.   Review of Systems - General ROS: negative for - sleep disturbance or weight gain Psychological ROS: negative for - anxiety or depression Cardiovascular  ROS: no chest pain or dyspnea on exertion Neurological ROS: negative for - confusion, headaches or seizures Musculoskeletal: Strength and tone normal, gait and station normal  Mental Status Examination  Appearance: Casual Alert: Yes Attention: good  Cooperative: Yes Eye Contact: Good Speech: Regular rate rhythm and volume Psychomotor Activity: Normal Memory/Concentration: Intact Oriented: person, place, time/date and situation Mood: Euthymic Affect: Congruent Thought Processes and Associations: Logical Fund of Knowledge: Fair Thought Content: No suicidal or homicidal thought Insight: Fair Judgement: Fair  Diagnosis: PTSD, MDD, recurrent, moderate  Treatment Plan: I will not make any changes today except to discontinue the Ambien.  I will see her back in 6 months. Patient to call with concerns.   Jamse Mead, MD

## 2013-03-24 ENCOUNTER — Ambulatory Visit (INDEPENDENT_AMBULATORY_CARE_PROVIDER_SITE_OTHER): Payer: Medicare Other | Admitting: Internal Medicine

## 2013-03-24 ENCOUNTER — Encounter: Payer: Self-pay | Admitting: Internal Medicine

## 2013-03-24 VITALS — BP 122/64 | HR 63 | Temp 98.5°F | Resp 10 | Ht 61.5 in | Wt 168.0 lb

## 2013-03-24 DIAGNOSIS — E119 Type 2 diabetes mellitus without complications: Secondary | ICD-10-CM

## 2013-03-24 DIAGNOSIS — E139 Other specified diabetes mellitus without complications: Secondary | ICD-10-CM

## 2013-03-24 MED ORDER — BROMOCRIPTINE MESYLATE 0.8 MG PO TABS: ORAL_TABLET | ORAL | Status: DC

## 2013-03-24 NOTE — Patient Instructions (Addendum)
Please return in 3 months with your sugar log.  Please start Cycloset (Bromocriptine) at 0.8 mg in am and increase to 1.6 after at least 1 week, if sugars not <150. Please check sugars 1x a day, rotating check times.  PATIENT INSTRUCTIONS FOR TYPE 2 DIABETES:  **Please join MyChart!** - see attached instructions about how to join   DIET AND EXERCISE Diet and exercise is an important part of diabetic treatment.  We recommended aerobic exercise in the form of brisk walking (working between 40-60% of maximal aerobic capacity, similar to brisk walking) for 150 minutes per week (such as 30 minutes five days per week) along with 3 times per week performing 'resistance' training (using various gauge rubber tubes with handles) 5-10 exercises involving the major muscle groups (upper body, lower body and core) performing 10-15 repetitions (or near fatigue) each exercise. Start at half the above goal but build slowly to reach the above goals. If limited by weight, joint pain, or disability, we recommend daily walking in a swimming pool with water up to waist to reduce pressure from joints while allow for adequate exercise.    BLOOD GLUCOSES Monitoring your blood glucoses is important for continued management of your diabetes. Please check your blood glucoses 1-4 times a day: fasting, before meals and at bedtime (you can rotate these measurements - e.g. one day check before the 3 meals, the next day check before 2 of the meals and before bedtime, etc.   HYPOGLYCEMIA (low blood sugar) Hypoglycemia is usually a reaction to not eating, exercising, or taking too much insulin/ other diabetes drugs.  Symptoms include tremors, sweating, hunger, confusion, headache, etc. Treat IMMEDIATELY with 15 grams of Carbs:   4 glucose tablets    cup regular juice/soda   2 tablespoons raisins   4 teaspoons sugar   1 tablespoon honey Recheck blood glucose in 15 mins and repeat above if still symptomatic/blood glucose  <100. Please contact our office at [phone] if you have questions about how to next handle your insulin.  RECOMMENDATIONS TO REDUCE YOUR RISK OF DIABETIC COMPLICATIONS: * Take your prescribed MEDICATION(S). * Follow a DIABETIC diet: Complex carbs, fiber rich foods, heart healthy fish twice weekly, (monounsaturated and polyunsaturated) fats * AVOID saturated/trans fats, high fat foods, >2,300 mg salt per day. * EXERCISE at least 5 times a week for 30 minutes or preferably daily.  * DO NOT SMOKE OR DRINK more than 1 drink a day. * Check your FEET every day. Do not wear tightfitting shoes. Contact us if you develop an ulcer * See your EYE doctor once a year or more if needed * Get a FLU shot once a year * Get a PNEUMONIA vaccine once before and once after age 82 years  GOALS:  * Your Hemoglobin A1c of <7%  * Your Systolic BP should be 140 or lower  * Your Diastolic BP should be 80 or lower  * Your HDL (Good Cholesterol) should be 40 or higher  * Your LDL (Bad Cholesterol) should be 100 or lower  * Your Triglycerides should be 150 or lower  * Your Urine microalbumin (kidney function) should be <30 * Your Body Mass Index should be 25 or lower   We will be glad to help you achieve these goals. Our telephone number is: [phone].

## 2013-03-24 NOTE — Progress Notes (Signed)
Patient ID: [name], female   DOB: [birth year], 47 y.o.   MRN: [id number]  HPI: [name] is a 47 y.o.-year-old female, referred by her PCP, [name], for management of DM2, non-insulin-dependent, uncontrolled, without complications.  Patient has been diagnosed with diabetes in 2013; she has not been on insulin before. Even as a child she had symptoms if delayed meals: nausea, dizziness, confusion. Last hemoglobin A1c was: Lab Results  Component Value Date   HGBA1C 7.1* 02/26/2013  previously 7.4%.  Pt is on a regimen of: - Quercetin 500 mg bid - actually taken for allergies She is now off metformin ER which caused her gastric discomfort and severe diarrhea (loss of bowel control). She had various intolerances to different diabetes medications: Glipizide: low CBGs, eating more, weight gain Januvia: tried for 1-2 months, developed left chest pain She was wondering if she could take Invokana, however she had multiple vaginal yeast infections 1-2 years ago. She has to urinate 3-4 x a night.   Pt checks her sugars 2x a week and they are: - am: 150-155 - later in the day it can go up to 200-250 if eats a lot of sugars She walks her dogs 3-4x a day. No lows. Lowest sugar was 140; she has hypoglycemia awareness but unknown at what level. Highest sugar was 250.  Has many food intolerances. No wheat >> no bread or pastry. Cannot eat a lot of raw vegetables, beans, cruciferous vegetables, not a lot of fruit >> diarrhea. She sticks to lean proteins: fish, [place], chicken, squash. She does not skip meals. No dairy. She does not digest fat well after her cholecystectomy in 2011. She is on Questran, otw gets more severe diarrhea. Takes Questran at noon.  Pt does not have chronic kidney disease, last BUN/creatinine was:  Lab Results  Component Value Date   BUN 9 02/26/2013   CREATININE 0.65 02/26/2013   She also has a h/o HL - Last set of lipids shown below. She is on Questran. Lab  Results  Component Value Date   CHOL 292* 02/26/2013   HDL 98 02/26/2013   LDLCALC 152* 02/26/2013   TRIG 211* 02/26/2013   CHOLHDL 3.0 02/26/2013  Previous cholesterol levels were: 325/197/132/154. She would like to avoid statins as she was told these can increase depression.  Pt's last eye exam was in >1 year ago, optometrist. Denies numbness and tingling in her legs.  I reviewed her chart and she also has a history of PCOS - she has seen [name] at [facility] in the past. I reviewed her note from 08/26/2012, in which it was mentioned that patient had perimenopause, and at that time she was on Yasmin, subsequently changed to [facility]. She sees [name] with OB/GYN. Patient also has PTSD, major depressive disorder, insomnia, anxiety, asthma, hypertension, GERD, status post cholecystectomy, anemia - with last hemoglobin of 11.5 - menorrhagia, improved a little on new OCP, - transaminitis with last ALT minimally elevated at 38.  Pt has FH of DM2 in mother and aunt (DM1).  ROS: Constitutional: no weight gain/loss, + fatigue, + both subjective hyperthermia/hypothermia, + poor sleep, + nocturia 3-4x a night Eyes: no blurry vision, no xerophthalmia ENT: no sore throat, no nodules palpated in throat, no dysphagia/odynophagia, no hoarseness Cardiovascular: no CP/SOB/palpitations/leg swelling Respiratory: no cough/+ SOB/ + wheezing Gastrointestinal: + N/no V/+ D/no C? + GERD Musculoskeletal: + muscle and joint aches Skin: no rashes Neurological: no tremors/numbness/tingling/dizziness Psychiatric: + depression/anxiety  Past Medical History  Diagnosis Date  . Depression   . Anxiety   . Asthma   . Diabetes mellitus type II   . Insomnia   . PTSD (post-traumatic stress disorder)   . High blood pressure   . PCOS (polycystic ovarian syndrome)   . Thyroid disease   . IBS (irritable bowel syndrome)   . Food intolerance   . Seasonal allergies    Past Surgical History  Procedure  Laterality Date  . Cholecystectomy     History   Social History  . Marital Status: Single    Spouse Name: N/A    Number of Children: 0   Occupational History  . Previously teacher   Social History Main Topics  . Smoking status: Never Smoker   . Smokeless tobacco: Not on file  . Alcohol Use: No  . Drug Use: No  . Sexually Active: Not Currently    Birth Control/ Protection: Pill   Social History Narrative   Regular exercise: yes, walk   Caffeine use: occasionally tea         Current Outpatient Prescriptions on File Prior to Visit  Medication Sig Dispense Refill  . albuterol (PROVENTIL HFA;VENTOLIN HFA) 108 (90 BASE) MCG/ACT inhaler Use 2 inhalations tid as needed for wheezing  3 Inhaler  0  . atenolol (TENORMIN) 50 MG tablet Take 50 mg by mouth daily. Pt is taking this as a PRN medication she states up to 4 a day for BP and anxiety      . cholestyramine (QUESTRAN) 4 G packet Take 1 packet by mouth daily.  60 each  1  . clonazePAM (KLONOPIN) 0.5 MG tablet TAKE ONE-HALF TABLET BY MOUTH AT BEDTIME  15 tablet  2  . fexofenadine (ALLEGRA) 180 MG tablet Take 180 mg by mouth daily.      . fluticasone (FLONASE) 50 MCG/ACT nasal spray PLACE 1 SPRAY INTO THE NOSE DAILY.  16 g  1  . irbesartan (AVAPRO) 75 MG tablet Take 1 tablet (75 mg total) by mouth daily.  90 tablet  1  . lansoprazole (PREVACID) 30 MG capsule TAKE 1 CAPSULE (30 MG TOTAL) BY MOUTH DAILY.  30 capsule  1  . naproxen (NAPROSYN) 500 MG tablet       . NECON 1/50, 28, 1-50 MG-MCG tablet       . OVER THE COUNTER MEDICATION 1 capsule daily. Probiotic renew life      . Quercetin 250 MG TABS Take 500 mg by mouth 2 (two) times daily.      . sertraline (ZOLOFT) 100 MG tablet TAKE 2 TABLETS BY MOUTH EVERY DAY  180 tablet  1  . zolpidem (AMBIEN CR) 6.25 MG CR tablet Take 1 tablet (6.25 mg total) by mouth at bedtime as needed.  30 tablet  2  . [DISCONTINUED] EFFEXOR XR 150 MG 24 hr capsule 375 mg daily.        No current  facility-administered medications on file prior to visit.  she also takes freeze-dried [place]ettle as an antihistaminic  Allergies  Allergen Reactions  . Latex Itching and Swelling  . Penicillins Other (See Comments)    dizzy   Family History  Problem Relation Age of Onset  . Bipolar disorder Mother   . Hypertension Mother   . Diabetes Mother   . Alzheimer's disease Father   . Hypertension Father   . Alzheimer's disease Maternal Grandmother    PE: BP 122/64  Pulse 63  Temp(Src) 98.5 F (36.9 C) (Oral)  Resp 10  Ht 5' 1.5" (1.562 m)  Wt 168 lb (76.204 kg)  BMI 31.23 kg/m2  SpO2 97%  LMP 02/23/2013 Wt Readings from Last 3 Encounters:  03/24/13 168 lb (76.204 kg)  03/23/13 170 lb (77.111 kg)  02/26/13 168 lb (76.204 kg)   Constitutional: overweight, in NAD Eyes: PERRLA, EOMI, no exophthalmos ENT: moist mucous membranes, no thyromegaly, no cervical lymphadenopathy Cardiovascular: RRR, No MRG Respiratory: CTA B Gastrointestinal: abdomen soft, NT, ND, BS+ Musculoskeletal: no deformities, strength intact in all 4 Skin: moist, warm, no rashes Neurological: no tremor with outstretched hands, DTR normal in all 4  ASSESSMENT: 1. DM2, non-insulin-dependent, uncontrolled, without complications  2. HL - refuses statins  Russell:  1. DM2 Patient with recently diagnosed DM 2, with a recent hemoglobin A1c that is close to target, at 7.1%. She has several intolerances to diabetic meds, for example metformin (even extended release), Januvia, glipizide. She is wondering whether she can use Invokana. - We discussed about possible use of Invokana, however, I would not recommend this for her for 2 reasons: History of several yeast infections and also increased urination with nocturia 3-4 times a night. I explained that we have other options for treatment and, in her case, I would recommend trying bromocriptine, which acts on lowering sugars, by regulating dopaminergic circulates responsible for  circadian secretion of insulin. She is open to this idea, so I will add this to 0.8 mg daily, with the prospect of increasing to 1.6 mg daily if she can tolerate it well and if her sugars remain higher than 150. - She appears to have a good diet, she is very knowledgeable about the nutritive and caloric value of foods - She describes reactive hypoglycemia symptoms, although these aren't not validated by checking her sugars. She is on quercetin, which he takes for allergies, however this is known to increase insulin production by the pancreas. Since reactive hypoglycemia can be caused by a mismatch between increased CBG after a meal and increased/prolonged insulin response from the pancreas, I advised her to stop quercetin for about a month and see if this will improve her symptoms. She tells me that she has been on this supplement "for years".  - I advised her to have a yearly eye exam - given sugar log and advised how to fill it and to bring it at next appt - check sugars once daily, rotating check times - given foot care handout and explained the principles - given instructions for hypoglycemia management "15-15 rule" - return to clinic in 3 months with her sugar log - Advised her to join my chart  2. HL - on questran, which helps with diarrhea post cholecystectomy - having a good diet, low in fat and processed sugars - I think we can continue to watch the lipids for now - suggested addition of Fish/flax oil as Lanetta Inch can raise TG

## 2013-04-14 ENCOUNTER — Ambulatory Visit (INDEPENDENT_AMBULATORY_CARE_PROVIDER_SITE_OTHER): Payer: Medicare Other | Admitting: Internal Medicine

## 2013-04-14 ENCOUNTER — Encounter: Payer: Self-pay | Admitting: Internal Medicine

## 2013-04-14 VITALS — BP 110/64 | HR 61 | Temp 98.7°F | Resp 12 | Wt 168.0 lb

## 2013-04-14 DIAGNOSIS — E119 Type 2 diabetes mellitus without complications: Secondary | ICD-10-CM

## 2013-04-14 DIAGNOSIS — E139 Other specified diabetes mellitus without complications: Secondary | ICD-10-CM

## 2013-04-14 NOTE — Patient Instructions (Signed)
Please increase Cyloset to 1.6 mg daily in am. Please return in 3 months with your sugar log.

## 2013-04-14 NOTE — Progress Notes (Signed)
Patient ID: [name], female   DOB: 08-02-1966, 47 y.o.   MRN: [id number]  HPI: [name] is a 47 y.o.-year-old female, returning for f/u for DM2, dx 2013, non-insulin-dependent, fairly well controlled, without complications. Last visit 3 weeks ago.  Last hemoglobin A1c was: Lab Results  Component Value Date   HGBA1C 7.1* 02/26/2013  previously 7.4%.  Pt is on a regimen of: - Bromocriptine - 0.8 mg daily - started at last visit  She is now off metformin ER which caused her gastric discomfort and severe diarrhea (loss of bowel control). She had various intolerances to different diabetes medications: Glipizide: low CBGs, eating more, weight gain Januvia: tried for 1-2 months, developed left chest pain She was wondering if she could take Invokana, however she had multiple vaginal yeast infections 1-2 years ago. She has to urinate 3-4 x a night.  At last visit, she described reactive hypoglycemia symptoms >> I advised her to stop Quercetin (was taking it for allergies), as it is known to increase insulin production by the pancreas.  She has more energy, which she is very excited about. Some congestion, not bad. She has increased menstrual bleeding - she has been on Necon for 3 months.  Pt checks her sugars 2-3 a week and they are - does not bring log or recall sugars checks later in the day: - am: 150-155 >> 150-155 - later in the day it can go up to 200-250 if eats a lot of sugars >>  She walks her dogs 3-4x a day. No lows. Lowest sugar was 140; she has hypoglycemia awareness but unknown at what level. Highest sugar was 250.  Has many food intolerances. No wheat >> no bread or pastry. Cannot eat a lot of raw vegetables, beans, cruciferous vegetables, not a lot of fruit >> diarrhea. She sticks to lean proteins: fish, [place], chicken, squash. She does not skip meals. No dairy. She does not digest fat well after her cholecystectomy in 2011. She is on Questran, otw gets more severe  diarrhea. Takes Questran at noon.  - No chronic kidney disease, last BUN/creatinine was:  Lab Results  Component Value Date   BUN 9 02/26/2013   CREATININE 0.65 02/26/2013  On Irbesartan.  - has a h/o HL - Last set of lipids shown below. She is on Questran. Lab Results  Component Value Date   CHOL 292* 02/26/2013   HDL 98 02/26/2013   LDLCALC 152* 02/26/2013   TRIG 211* 02/26/2013   CHOLHDL 3.0 02/26/2013  Previous cholesterol levels were: 325/197/132/154. She would like to avoid statins as she was told these can increase depression.  - last eye exam was in >1 year ago, optometrist.  - no numbness and tingling in her legs.  She also has a history of PCOS - she has seen Dr. Rosine Door at [facility] in the past - note from 08/26/2012: perimenopause, and at that time she was on Yasmin, subsequently changed to Necon. She sees Dr. Marvel Plan with OB/GYN. She has PTSD, MDD, insomnia, anxiety, asthma, HTN, GERD, status post cholecystectomy, anemia - with last hemoglobin of 11.5 - menorrhagia, improved a little on new OCP, - transaminitis with last ALT minimally elevated at 38.  I reviewed pt's medications, allergies, PMH, social hx, family hx and no changes required, except as mentioned above.  ROS: Constitutional: no weight gain/loss, no fatigue, no subjective hyperthermia/hypothermia Eyes: no blurry vision, no xerophthalmia ENT: no sore throat, no nodules palpated in throat, no dysphagia/odynophagia, no  hoarseness Cardiovascular: no CP/SOB/palpitations/leg swelling Respiratory: no cough/SOB Gastrointestinal: no N/V/D/C Musculoskeletal: no muscle/joint aches Skin: no rashes  PE: BP 110/64  Pulse 61  Temp(Src) 98.7 F (37.1 C) (Oral)  Resp 12  Wt 168 lb (76.204 kg)  BMI 31.23 kg/m2  SpO2 95% Wt Readings from Last 3 Encounters:  04/14/13 168 lb (76.204 kg)  03/24/13 168 lb (76.204 kg)  03/23/13 170 lb (77.111 kg)   Constitutional: overweight, in NAD Eyes: PERRLA, EOMI, no  exophthalmos ENT: moist mucous membranes, no thyromegaly, no cervical lymphadenopathy Cardiovascular: RRR, No MRG Respiratory: CTA B Gastrointestinal: abdomen soft, NT, ND, BS+ Musculoskeletal: no deformities, strength intact in all 4 Skin: moist, warm, no rashes + increased uterine bleeding  ASSESSMENT: 1. DM2, non-insulin-dependent, fairly well controlled, without complications  2. Meno- metrorrhagia - on Necon 1/50  PLAN:  1. DM2 Patient with recently diagnosed DM 2, with a recent hemoglobin A1c that is close to target, at 7.1%. She has several intolerances to diabetic meds, for example metformin (even extended release), Januvia, glipizide. We cannot use Invokana due to h/o UTIs, and her nocturia. - since sugars improved, and she tolerates it well, continue bromocriptine, increasing to 1.6 mg daily since she can tolerate it well and since her sugars appear to be still high, although not many CBG data  - She appears to have a good diet, she is very knowledgeable about the nutritive and caloric value of foods, however, she told me that last night she ate a whole jar of caramel topping: sugar last night 211 >> 160 this am - I advised her to have a yearly eye exam >> referred her to dr Ashley Royalty - return to clinic in 3 months with her sugar log - Advised her to join my chart  2. Menometrorrhagia - bleeding heavily for 1 week, a little lighter before, on Necon - I advised her to see her ObGyn again to maybe reduce the Estradiol dose

## 2013-04-16 DIAGNOSIS — N949 Unspecified condition associated with female genital organs and menstrual cycle: Secondary | ICD-10-CM | POA: Diagnosis not present

## 2013-04-19 ENCOUNTER — Other Ambulatory Visit: Payer: Self-pay | Admitting: Internal Medicine

## 2013-04-22 ENCOUNTER — Ambulatory Visit (INDEPENDENT_AMBULATORY_CARE_PROVIDER_SITE_OTHER): Payer: Medicare Other | Admitting: Licensed Clinical Social Worker

## 2013-04-22 ENCOUNTER — Telehealth: Payer: Self-pay | Admitting: *Deleted

## 2013-04-22 DIAGNOSIS — F332 Major depressive disorder, recurrent severe without psychotic features: Secondary | ICD-10-CM | POA: Diagnosis not present

## 2013-04-22 NOTE — Telephone Encounter (Signed)
Mar[place]ha needs refill on lansoprazole (PREVACID) 30 MG capsule sent to [facility].

## 2013-04-22 NOTE — Progress Notes (Signed)
   THERAPIST PROGRESS NOTE  Session Time: 11:30am-12:20pm  Participation Level: Active  Behavioral Response: Well GroomedAlertEuthymic  Type of Therapy: Individual Therapy  Treatment Goals addressed: Coping  Interventions: Strength-based and Supportive  Summary: [name] is a 47 y.o. female who presents with euthymic mood and bright affect. she reports feeling well over the past month with well controlled depression and anxiety. She denies any panic attacks or PTSD symptoms. She is enjoying time with friends. She is happy that her father bought her a new car. Her primary energy has been focused on her physical health, managing her diabetes and heavy periods. She is currently fatigued from heavy bleeding, but is working with her doctor to try new medication to manage her hormonal shifts. She is socializing well and optimistic about her future. Her sleep and appetite are wnl.    Suicidal/Homicidal: Nowithout intent/plan  Therapist Response: Assessed patients current functioning and reviewed progress. Reviewed coping strategies. Assessed patients safety and assisted in identifying protective factors.  Reviewed crisis plan with patient. Assisted patient with the expression of frustration over her chronic health problems. . Reviewed patients self care plan. Assessed progress related to self care. Patients self care is very good. Recommend daily exercise, increased socialization and recreation. Used CBT to assist patient with the identification of negative distortions and irrational thoughts. Encouraged patient to verbalize alternative and factual responses which challenge thought distortions. Reviewed healthy boundaries and assertive communication.   Plan: Return again in four weeks.  Diagnosis: Axis I: Major Depression, Recurrent severe    Axis II: No diagnosis    Aloys Hupfer, [name] 04/22/2013

## 2013-04-23 ENCOUNTER — Other Ambulatory Visit: Payer: Self-pay | Admitting: Internal Medicine

## 2013-04-23 MED ORDER — LANSOPRAZOLE 30 MG PO CPDR: 30.0000 mg | DELAYED_RELEASE_CAPSULE | Freq: Every day | ORAL | Status: DC

## 2013-05-14 ENCOUNTER — Other Ambulatory Visit: Payer: Self-pay | Admitting: Internal Medicine

## 2013-05-14 NOTE — Telephone Encounter (Signed)
Refill request

## 2013-05-23 ENCOUNTER — Other Ambulatory Visit: Payer: Self-pay | Admitting: Internal Medicine

## 2013-05-25 NOTE — Telephone Encounter (Signed)
Refill request

## 2013-05-26 ENCOUNTER — Telehealth: Payer: Self-pay | Admitting: Endocrinology

## 2013-05-27 ENCOUNTER — Ambulatory Visit (INDEPENDENT_AMBULATORY_CARE_PROVIDER_SITE_OTHER): Payer: Medicare Other | Admitting: Licensed Clinical Social Worker

## 2013-05-27 DIAGNOSIS — F332 Major depressive disorder, recurrent severe without psychotic features: Secondary | ICD-10-CM | POA: Diagnosis not present

## 2013-05-27 NOTE — Progress Notes (Signed)
   THERAPIST PROGRESS NOTE  Session Time: 10:30am-11:20am  Participation Level: Active  Behavioral Response: Well GroomedAlertEuthymic  Type of Therapy: Individual Therapy  Treatment Goals addressed: Coping  Interventions: CBT, Strength-based, Supportive and Reframing  Summary: [name] is a 47 y.o. female who presents with euthymic mood and bright affect. she reports that she has been doing well, with continued and sustained mood stability and well controlled anxiety. She has been keeping busy by socializing with friends and volunteering for Hospice. 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 Her PTSD symptoms are well controlled. She has been out on two dates and continues to experience some frustration over the lack of people to date in this area. Her sleep and appetite are wnl.    Suicidal/Homicidal: Nowithout intent/plan  Therapist Response: Assessed patients current functioning and reviewed progress. Reviewed coping strategies. Assessed patients safety and assisted in identifying protective factors.  Reviewed crisis plan with patient. . Reviewed patients self care plan. Assessed progress related to self care. Patients self care is good. Recommend daily exercise, increased socialization and recreation. Reviewed healthy boundaries and assertive communication. Used CBT to assist patient with the identification of negative distortions and irrational thoughts. Encouraged patient to verbalize alternative and factual responses which challenge thought distortions.   Plan: Return again in frou weeks.  Diagnosis: Axis I: Major Depression, Recurrent severe    Axis II: No diagnosis    Annalysa Mohammad, [name] 05/27/2013

## 2013-06-01 ENCOUNTER — Encounter (INDEPENDENT_AMBULATORY_CARE_PROVIDER_SITE_OTHER): Payer: Self-pay | Admitting: Ophthalmology

## 2013-06-02 ENCOUNTER — Encounter (INDEPENDENT_AMBULATORY_CARE_PROVIDER_SITE_OTHER): Payer: Self-pay | Admitting: Ophthalmology

## 2013-06-05 ENCOUNTER — Ambulatory Visit: Payer: Self-pay | Admitting: Internal Medicine

## 2013-06-15 ENCOUNTER — Ambulatory Visit: Payer: Self-pay | Admitting: Internal Medicine

## 2013-06-17 ENCOUNTER — Other Ambulatory Visit: Payer: Self-pay | Admitting: Internal Medicine

## 2013-06-17 NOTE — Telephone Encounter (Signed)
Refill request

## 2013-06-24 ENCOUNTER — Ambulatory Visit (INDEPENDENT_AMBULATORY_CARE_PROVIDER_SITE_OTHER): Payer: Medicare Other | Admitting: Licensed Clinical Social Worker

## 2013-06-24 DIAGNOSIS — F332 Major depressive disorder, recurrent severe without psychotic features: Secondary | ICD-10-CM | POA: Diagnosis not present

## 2013-06-24 NOTE — Progress Notes (Signed)
   THERAPIST PROGRESS NOTE  Session Time: 10:30am-11:20am  Participation Level: Active  Behavioral Response: Well GroomedAlertEuthymic  Type of Therapy: Individual Therapy  Treatment Goals addressed: Coping  Interventions: CBT, Strength-based, Supportive and Reframing  Summary: [name] is a 47 y.o. female who presents with euthymic mood and bright affect. she reports doing very well, with well controlled depression and anxiety. She has been contacted by her mother asking her to contact her so she can reveal a secret she has been keeping. Patient processes how this affected her with disrupted sleep and nightmares and her decision not to allow this conversation. She processes her anger with her mother and her disappointment that she does not understand patients needs. Her sleep and appetite are wnl. She is focusing on her eating habits, trying to get handle on her concept of abundance and fear that she will not have food.   Suicidal/Homicidal: Nowithout intent/plan  Therapist Response: Assessed patients current functioning and reviewed progress. Reviewed coping strategies. Assessed patients safety and assisted in identifying protective factors.  Reviewed crisis plan with patient. Assisted patient with the expression of frustration with her mothers attempts to manipulate her. Reviewed patients self care plan. Assessed progress related to self care. Patients self care is good. Recommend daily exercise, increased socialization and recreation. Used CBT to assist patient with the identification of negative distortions and irrational thoughts. Encouraged patient to verbalize alternative and factual responses which challenge thought distortions. Used DBT to practice mindfulness, review distraction list and improve distress tolerance skills.   Plan: Return again in four weeks.  Diagnosis: Axis I: Major Depression, Recurrent severe    Axis II: No diagnosis    Delfina Schreurs,  [name] 06/24/2013

## 2013-06-29 ENCOUNTER — Telehealth: Payer: Self-pay | Admitting: *Deleted

## 2013-06-29 NOTE — Telephone Encounter (Signed)
[name] with [facility] called to let Dr Elvera Lennox know that the pt had an appt scheduled for 9/15 and cancelled. Rescheduled for 9/16, called that morning and cancelled. Pt called and rescheduled appt for 10/15. Pt called this morning to cancel appt again. They just wanted Dr Elvera Lennox to be aware of this. Be advised.

## 2013-06-30 ENCOUNTER — Other Ambulatory Visit: Payer: Self-pay | Admitting: Internal Medicine

## 2013-07-01 ENCOUNTER — Encounter (INDEPENDENT_AMBULATORY_CARE_PROVIDER_SITE_OTHER): Payer: Self-pay | Admitting: Ophthalmology

## 2013-07-07 ENCOUNTER — Ambulatory Visit: Payer: Self-pay | Admitting: Internal Medicine

## 2013-07-10 ENCOUNTER — Encounter: Payer: Self-pay | Admitting: Internal Medicine

## 2013-07-10 ENCOUNTER — Ambulatory Visit (INDEPENDENT_AMBULATORY_CARE_PROVIDER_SITE_OTHER): Payer: Medicare Other | Admitting: Internal Medicine

## 2013-07-10 ENCOUNTER — Other Ambulatory Visit: Payer: Self-pay | Admitting: *Deleted

## 2013-07-10 VITALS — BP 118/62 | HR 65 | Temp 98.2°F | Resp 10 | Wt 164.5 lb

## 2013-07-10 DIAGNOSIS — Z23 Encounter for immunization: Secondary | ICD-10-CM | POA: Diagnosis not present

## 2013-07-10 DIAGNOSIS — E139 Other specified diabetes mellitus without complications: Secondary | ICD-10-CM

## 2013-07-10 DIAGNOSIS — E119 Type 2 diabetes mellitus without complications: Secondary | ICD-10-CM

## 2013-07-10 NOTE — Progress Notes (Signed)
Patient ID: [name], female   DOB: July 21, 1966, 47 y.o.   MRN: [id number]  HPI: [name] is a 47 y.o.-year-old female, returning for f/u for DM2, dx 2013, non-insulin-dependent, well controlled, without complications. Last visit 3 mo ago.  Last hemoglobin A1c was: Lab Results  Component Value Date   HGBA1C 7.1* 02/26/2013  previously 7.4%.  Pt is on a regimen of: - Bromocriptine - 1.6 mg daily (increased from 0.8 at last visit)  She had various intolerances to different diabetes medications:  Metformin ER >> gastric discomfort and severe diarrhea (loss of bowel control). Glipizide: low CBGs, eating more, weight gain  Januvia >> tried for 1-2 months >> left chest pain  She was wondering if she could take Invokana, however she had multiple vaginal yeast infections 1-2 years ago. She has to urinate 3-4 x a night.   At last visit, she described reactive hypoglycemia symptoms >> I advised her to stop Quercetin (was taking it for allergies), as it is known to increase insulin production by the pancreas.  Pt checks her sugars 2-3 a week and they are - does not bring log or recall sugars checks later in the day: - am: 150-155 >> 150-155 >> 111-130 (150 when eating desert the night before) - later in the day it can go up to 200-250 >> ?? Cannot remember She walks her dogs 3-4x a day. No lows. Lowest sugar was 111; she has hypoglycemia awareness but unknown at what level. Highest sugar was 250.  Has many food intolerances: - wheat >> cannot eat bread or pastry.  - raw vegetables, beans, cruciferous vegetables, fruit >> diarrhea.  - No dairy - She does not digest fat well after her cholecystectomy in 2011. She is on Questran, otw gets more severe diarrhea. Takes Questran at noon. She sticks to lean proteins: fish, [place], chicken, squash. She does not skip meals.   She cut down sodas, candy,   - No chronic kidney disease, last BUN/creatinine was:  Lab Results  Component Value  Date   BUN 9 02/26/2013   CREATININE 0.65 02/26/2013  On Irbesartan.  - has a h/o HL - Last set of lipids shown below. She is on Questran. Lab Results  Component Value Date   CHOL 292* 02/26/2013   HDL 98 02/26/2013   LDLCALC 152* 02/26/2013   TRIG 211* 02/26/2013   CHOLHDL 3.0 02/26/2013  Previous cholesterol levels were: 325/197/132/154. She would like to avoid statins as she was told these can increase depression.  - last eye exam was in >1 year ago, optometrist >> has an appt with Dr Ashley Royalty on 07/20/2013.  - no numbness and tingling in her legs.  She also has a history of PCOS - she has seen Dr. Rosine Door at [facility] in the past - note from 08/26/2012: perimenopause, and at that time she was on Yasmin, subsequently changed to Necon. She sees Dr. Marvel Plan with OB/GYN. She has PTSD, MDD, insomnia, anxiety, asthma, HTN, GERD, status post cholecystectomy, anemia, menorrhagia, improved a little on new OCP, transaminitis.  I reviewed pt's medications, allergies, PMH, social hx, family hx and no changes required, except as mentioned above.  ROS: Constitutional: no weight gain/loss, no fatigue, no subjective hyperthermia/hypothermia Eyes: no blurry vision, no xerophthalmia ENT: no sore throat, no nodules palpated in throat, no dysphagia/odynophagia, no hoarseness Cardiovascular: no CP/SOB/palpitations/leg swelling Respiratory: no cough/SOB Gastrointestinal: no N/V/D/C Musculoskeletal: no muscle/joint aches Skin: no rashes  PE: BP 118/62  Pulse 65  Temp(Src) 98.2 F (36.8 C) (Oral)  Resp 10  Wt 164 lb 8 oz (74.617 kg)  BMI 30.58 kg/m2  SpO2 97% Wt Readings from Last 3 Encounters:  07/10/13 164 lb 8 oz (74.617 kg)  04/14/13 168 lb (76.204 kg)  03/24/13 168 lb (76.204 kg)   Constitutional: overweight, truncal obesity, in NAD Eyes: PERRLA, EOMI, no exophthalmos ENT: moist mucous membranes, no thyromegaly, no cervical lymphadenopathy Cardiovascular: RRR, 2/6 SEM, no  MRG Respiratory: CTA B Gastrointestinal: abdomen soft, NT, ND, BS+ Musculoskeletal: no deformities, strength intact in all 4 Skin: moist, warm, no rashes  ASSESSMENT: 1. DM2, non-insulin-dependent, fairly well controlled, without complications  PLAN:  1. DM2 Patient with recently diagnosed DM 2, with a recent hemoglobin A1c that is close to target, at 7.1%.  She has several intolerances to diabetic meds - metformin (even extended release), Januvia, glipizide. We cannot use Invokana due to h/o UTIs, and her nocturia. - since sugars improved, and she tolerates it well, continue bromocriptine 1.6 mg daily since she can tolerate it well and since her sugars appear to be still high, although not many CBG data  - She appears to have a good diet, she is very knowledgeable about the nutritive and caloric value of foods - I advised her to have a yearly eye exam >> referred her to dr Ashley Royalty at last visit - will give flu vaccine today - will check HbA1C - return to clinic in 3 months with her sugar log - Advised her to join my chart  Office Visit on 07/10/2013  Component Date Value Range Status  . Hemoglobin A1C 07/10/2013 6.9* 4.6 - 6.5 % Final   Glycemic Control Guidelines for People with Diabetes:Non Diabetic:  <6%Goal of Therapy: <7%Additional Action Suggested:  >8%    Hba1c improved further to 6.9%. Will continue Cycloset at 1.6 mg daily.

## 2013-07-10 NOTE — Patient Instructions (Signed)
Please try to check sugars later in the day, too (before or 2h after a meal) and write them down. Please return in 3 months with your sugar log.  Please stop at the lab.

## 2013-07-13 ENCOUNTER — Other Ambulatory Visit: Payer: Self-pay | Admitting: *Deleted

## 2013-07-13 MED ORDER — GLUCOSE BLOOD VI STRP: ORAL_STRIP | Status: DC

## 2013-07-13 NOTE — Telephone Encounter (Signed)
Resending rx to include dx code. Pharmacy called and requested this.

## 2013-07-20 ENCOUNTER — Encounter (INDEPENDENT_AMBULATORY_CARE_PROVIDER_SITE_OTHER): Payer: Self-pay | Admitting: Ophthalmology

## 2013-07-21 ENCOUNTER — Other Ambulatory Visit (HOSPITAL_COMMUNITY): Payer: Self-pay | Admitting: Psychiatry

## 2013-07-21 ENCOUNTER — Other Ambulatory Visit: Payer: Self-pay | Admitting: Internal Medicine

## 2013-07-21 NOTE — Telephone Encounter (Signed)
Refill request

## 2013-07-27 ENCOUNTER — Ambulatory Visit (INDEPENDENT_AMBULATORY_CARE_PROVIDER_SITE_OTHER): Payer: Medicare Other | Admitting: Licensed Clinical Social Worker

## 2013-07-27 ENCOUNTER — Other Ambulatory Visit: Payer: Self-pay | Admitting: *Deleted

## 2013-07-27 DIAGNOSIS — F332 Major depressive disorder, recurrent severe without psychotic features: Secondary | ICD-10-CM

## 2013-07-27 NOTE — Telephone Encounter (Signed)
Refill request
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
## 2013-07-28 ENCOUNTER — Other Ambulatory Visit: Payer: Self-pay | Admitting: *Deleted

## 2013-07-28 ENCOUNTER — Telehealth: Payer: Self-pay | Admitting: *Deleted

## 2013-07-28 MED ORDER — NAPROXEN 500 MG PO TABS: 500.0000 mg | ORAL_TABLET | Freq: Two times a day (BID) | ORAL | Status: DC

## 2013-07-28 NOTE — Telephone Encounter (Signed)
Refill request sent in this AM.

## 2013-07-28 NOTE — Telephone Encounter (Signed)
Refill request

## 2013-07-28 NOTE — Telephone Encounter (Signed)
[name] called she has medications that she needs refilled   Atenolol 50 mg 4 times/day    Naproxen 500 mg  2 PRN for migraines quantity of 60   to [facility] on [address]

## 2013-07-28 NOTE — Telephone Encounter (Signed)
Refill request.  Reviewed chart. Will provide refill for sertraline.  She has any appointment 09/23/2013

## 2013-07-29 ENCOUNTER — Other Ambulatory Visit: Payer: Self-pay | Admitting: *Deleted

## 2013-07-29 NOTE — Telephone Encounter (Signed)
Refill request

## 2013-07-30 NOTE — Telephone Encounter (Signed)
LVM to return call.

## 2013-07-30 NOTE — Telephone Encounter (Signed)
I declined this refill  Call pt and find out what provider prescribed the instructions to take atenolol up to 4 times a day for BP or anxiety  ADvise her this is dangerous to take 4 of the 50 mg atenolol pills in a 24 hour period  Ask how she is truly taking her atenolol and route back to me

## 2013-08-06 ENCOUNTER — Other Ambulatory Visit: Payer: Self-pay | Admitting: Internal Medicine

## 2013-08-06 ENCOUNTER — Other Ambulatory Visit: Payer: Self-pay | Admitting: *Deleted

## 2013-08-06 MED ORDER — ATENOLOL 50 MG PO TABS: 50.0000 mg | ORAL_TABLET | Freq: Four times a day (QID) | ORAL | Status: DC

## 2013-08-06 MED ORDER — ATENOLOL 50 MG PO TABS: 50.0000 mg | ORAL_TABLET | Freq: Every day | ORAL | Status: DC

## 2013-08-06 NOTE — Telephone Encounter (Signed)
LVM message again for pt to return call regarding her atenolol RX

## 2013-08-18 ENCOUNTER — Other Ambulatory Visit: Payer: Self-pay | Admitting: Internal Medicine

## 2013-08-18 ENCOUNTER — Ambulatory Visit (INDEPENDENT_AMBULATORY_CARE_PROVIDER_SITE_OTHER): Payer: Medicare Other | Admitting: Internal Medicine

## 2013-08-18 ENCOUNTER — Encounter: Payer: Self-pay | Admitting: Internal Medicine

## 2013-08-18 VITALS — BP 108/64 | HR 59 | Temp 97.3°F | Resp 18 | Wt 162.0 lb

## 2013-08-18 DIAGNOSIS — I10 Essential (primary) hypertension: Secondary | ICD-10-CM

## 2013-08-18 DIAGNOSIS — E785 Hyperlipidemia, unspecified: Secondary | ICD-10-CM

## 2013-08-18 DIAGNOSIS — F332 Major depressive disorder, recurrent severe without psychotic features: Secondary | ICD-10-CM

## 2013-08-18 DIAGNOSIS — F4310 Post-traumatic stress disorder, unspecified: Secondary | ICD-10-CM

## 2013-08-18 MED ORDER — ATENOLOL 50 MG PO TABS: ORAL_TABLET | ORAL | Status: DC

## 2013-08-18 NOTE — Patient Instructions (Signed)
See me in January   

## 2013-08-18 NOTE — Progress Notes (Signed)
Subjective:    Patient ID: [name], female    DOB: [birth year], 47 y.o.   MRN: [id number]  HPI [name] is here to follow up on several issues.  She reports she had been taking her tenormin 50 mg 4 times daily for her anxiety.  She has been doing this for years and apparently it does not negatively affect her asthma.    She denies orthostatic  Dizziness when taking med 4 times a day.  She is asking if she can stop the irbesartan  PTSD  She tells me greatly improved with her therapist and medications she takes.  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  She has stopped her  pre-med classes  And this has helped her anxiety a great deal  Diabetes  AIC has been under good control  Managed by Dr. Elvera Lennox  Hyperlipidemia  Navedo adamantly refuses prescribed meds for this despite advice from 2 MD's that this is necessary.  Both parents had a reaction to statins.    Allergies  Allergen Reactions  . Latex Itching and Swelling  . Penicillins Other (See Comments)    dizzy   Past Medical History  Diagnosis Date  . Depression   . Anxiety   . Asthma   . Diabetes mellitus type II   . Insomnia   . PTSD (post-traumatic stress disorder)   . High blood pressure   . PCOS (polycystic ovarian syndrome)   . Thyroid disease   . IBS (irritable bowel syndrome)   . Food intolerance   . Seasonal allergies    Past Surgical History  Procedure Laterality Date  . Cholecystectomy     History   Social History  . Marital Status: Single    Spouse Name: N/A    Number of Children: N/A  . Years of Education: N/A   Occupational History  . Not on file.   Social History Main Topics  . Smoking status: Never Smoker   . Smokeless tobacco: Not on file  . Alcohol Use: No  . Drug Use: No  . Sexual Activity: Not Currently    Birth Control/ Protection: Pill   Other Topics Concern  . Not on file   Social History Narrative   Regular exercise: yes, walk   Caffeine use: occasionally tea         Family  History  Problem Relation Age of Onset  . Bipolar disorder Mother   . Hypertension Mother   . Diabetes Mother   . Alzheimer's disease Father   . Hypertension Father   . Alzheimer's disease Maternal Grandmother    Patient Active Problem List   Diagnosis Date Noted  . Diarrhea 09/01/2012  . Essential hypertension 08/26/2012  . Irregular menses 08/07/2012  . Essential hypertension, benign 08/07/2012  . History of abnormal Pap smear 08/07/2012  . 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 GERD (gastroesophageal reflux disease) 08/07/2012  . Asthma 08/07/2012  . History of migraine 08/07/2012  . History of PCOS 08/07/2012  . Genital warts 08/07/2012  . S/P cholecystectomy 08/07/2012  . Allergic rhinitis 08/07/2012  . Major depressive disorder, recurrent episode, severe, without mention of psychotic behavior 12/06/2011  . PTSD (post-traumatic stress disorder) 11/02/2011   Current Outpatient Prescriptions on File Prior to Visit  Medication Sig Dispense Refill  . albuterol (PROVENTIL HFA;VENTOLIN HFA) 108 (90 BASE) MCG/ACT inhaler Use 2 inhalations tid as needed for wheezing  3 Inhaler  0  . cholestyramine (QUESTRAN) 4 G packet TAKE 1  PACKET BY MOUTH DAILY.  60 packet  1  . CYCLOSET 0.8 MG TABS TAKE 2 TABLETS BY MOUTH IN AM DAILY (START AT 0.8 MG DAILY FOR AT LEAST 1 WEEK)  60 tablet  2  . fexofenadine (ALLEGRA) 180 MG tablet Take 180 mg by mouth daily.      . fluticasone (FLONASE) 50 MCG/ACT nasal spray PLACE 2 SPRAYS INTO THE NOSE DAILY.      . fluticasone (FLONASE) 50 MCG/ACT nasal spray PLACE 1 SPRAY INTO THE NOSE DAILY.  16 g  0  . glucose blood (ONE TOUCH TEST STRIPS) test strip Test blood glucose 2 times daily as instructed. Dx code: 250.00  100 each  4  . lansoprazole (PREVACID) 30 MG capsule Take 1 capsule (30 mg total) by mouth daily.  30 capsule  5  . XULANE 150-35 MCG/24HR transdermal patch       . clonazePAM (KLONOPIN) 0.5 MG tablet TAKE ONE-HALF TABLET BY MOUTH AT  BEDTIME  15 tablet  2  . naproxen (NAPROSYN) 500 MG tablet Take 1 tablet (500 mg total) by mouth 2 (two) times daily with a meal.  60 tablet  0  . OVER THE COUNTER MEDICATION 1 capsule daily. Probiotic renew life      . sertraline (ZOLOFT) 100 MG tablet TAKE 2 TABLETS BY MOUTH EVERY DAY  180 tablet  0  . zolpidem (AMBIEN CR) 6.25 MG CR tablet Take 1 tablet (6.25 mg total) by mouth at bedtime as needed.  30 tablet  2  . [DISCONTINUED] EFFEXOR XR 150 MG 24 hr capsule 375 mg daily.        No current facility-administered medications on file prior to visit.       Review of Systems See HPI    Objective:   Physical Exam Physical Exam  Nursing note and vitals reviewed.  Constitutional: She is oriented to person, place, and time. She appears well-developed and well-nourished.  HENT:  Head: Normocephalic and atraumatic.  Cardiovascular: Normal rate and regular rhythm. Exam reveals no gallop and no friction rub.  No murmur heard.  Pulmonary/Chest: Breath sounds normal. She has no wheezes. She has no rales.  Neurological: She is alert and oriented to person, place, and time.  Skin: Skin is warm and dry.  Psychiatric: She has a normal mood and affect. Her behavior is normal.         Assessment & Plan:  HTN   Pt is agreeable to take her tenormin  2 tabs bid  Instead of 4 times a day.  Since diastolic BP on the low side ,  I am OK with stopping her irbesartan.  She is to check her BP at home and call if any problems  .  I will follow up with her in January.     anxiety/  PTSD  Much improved   Continue meds and counseling  Hyperlipidemia  See HPI  Has refused all prescribed meds multiple times  Diabetes  Managed by Endocrine

## 2013-08-19 ENCOUNTER — Other Ambulatory Visit: Payer: Self-pay | Admitting: Internal Medicine

## 2013-08-19 MED ORDER — ATENOLOL 50 MG PO TABS: ORAL_TABLET | ORAL | Status: DC

## 2013-08-26 ENCOUNTER — Ambulatory Visit (INDEPENDENT_AMBULATORY_CARE_PROVIDER_SITE_OTHER): Payer: Medicare Other | Admitting: Licensed Clinical Social Worker

## 2013-08-26 DIAGNOSIS — F332 Major depressive disorder, recurrent severe without psychotic features: Secondary | ICD-10-CM

## 2013-08-26 NOTE — Progress Notes (Signed)
   THERAPIST PROGRESS NOTE  Session Time: 1:00pm-1:50pm  Participation Level: Active  Behavioral Response: Well GroomedAlertDepressed  Type of Therapy: Individual Therapy  Treatment Goals addressed: Anxiety, Communication: expression of grief and loss related to her best friend leaving,  and Coping. Boundary setting with her family to protect her from increased PTSD symptoms.   Interventions: CBT, Solution Focused, Strength-based, Supportive and Reframing  Summary: [name] is a 47 y.o. female who presents with depressed mood and flat affect. She reports an increase in sadness because her best friend has moved away. She states that "she was the only person who got me". She is trying to remain social, but does notice an increase in her isolation.  She is angry that her mother is emailing her and reports that they increase her PTSD symtpoms. She has emailed her back with strong boundaries and is hopeful that they will be respected. She continues to volunteer and finds purpose in this. Her sleep and appetite are wnl.  Suicidal/Homicidal: Nowithout intent/plan  Therapist Response: Assessed patients current functioning and reviewed progress. Reviewed coping strategies. Assessed patients safety and assisted in identifying protective factors.  Reviewed crisis plan with patient. Assisted patient with the expression of frustration with her mother. Reviewed patients self care plan. Assessed progress related to self care. Patients self care is very good. Recommend daily exercise, increased socialization and recreation. Reviewed healthy boundaries and assertive communication. Processed and normalized patients grief reaction. Used CBT to assist patient with the identification of negative distortions and irrational thoughts. Encouraged patient to verbalize alternative and factual responses which challenge thought distortions. Processed and normalized patients grief reaction.   Plan: Return again in four  weeks.  Diagnosis: Axis I: Major Depression, Recurrent severe and Post Traumatic Stress Disorder    Axis II: No diagnosis    [name], [name] 08/26/2013

## 2013-09-14 ENCOUNTER — Telehealth: Payer: Self-pay | Admitting: *Deleted

## 2013-09-14 NOTE — Telephone Encounter (Signed)
Shalea called and left a message wanting to let us know that she was taking her Avapro again as it was keeping her from retaining fluid.  I called and left a message for her to call us back.

## 2013-09-21 ENCOUNTER — Other Ambulatory Visit: Payer: Self-pay | Admitting: Internal Medicine

## 2013-09-23 ENCOUNTER — Encounter (HOSPITAL_COMMUNITY): Payer: Self-pay | Admitting: Psychiatry

## 2013-09-23 ENCOUNTER — Ambulatory Visit (HOSPITAL_COMMUNITY): Payer: Self-pay | Admitting: Psychiatry

## 2013-09-23 ENCOUNTER — Ambulatory Visit (INDEPENDENT_AMBULATORY_CARE_PROVIDER_SITE_OTHER): Payer: Medicare Other | Admitting: Psychiatry

## 2013-09-23 VITALS — BP 124/63 | HR 60 | Wt 164.0 lb

## 2013-09-23 DIAGNOSIS — F4310 Post-traumatic stress disorder, unspecified: Secondary | ICD-10-CM

## 2013-09-23 DIAGNOSIS — F332 Major depressive disorder, recurrent severe without psychotic features: Secondary | ICD-10-CM | POA: Diagnosis not present

## 2013-09-23 MED ORDER — SERTRALINE HCL 100 MG PO TABS: 200.0000 mg | ORAL_TABLET | Freq: Every day | ORAL | Status: DC

## 2013-09-23 NOTE — Progress Notes (Signed)
[facility] [id number] Progress Note  Lilly CoveSara E Fowles [id number] 48 y.o.  09/23/2013 2:05 PM  Chief Complaint:  HPI Comments: Mrs. [name] is  a 48 y/o female with a past psychiatric history significant for Post traumatic stress Disorder, Major Depressive Disorder, recurrent, severe. The patient is referred for psychiatric services for psychiatric evaluation and medication management.    . Location: The patient reports she is "surprised" today as she was expecting to see Dr. Christell ConstantMoore. . Quality: The patient reports she continues to go to individual therapy weekly which she finds helpful. She states that she has been on a combination of  Sertraline 200 mg daily with a small dose of clonazepam 0.125 mg at bedtime for sleep. She states she has a large amount to clonazepam remaining as she uses very little.   In the area of affective symptoms, patient appears anxious. Patient denies current suicidal ideation, intent, or plan. Patient denies current homicidal ideation, intent, or plan. Patient denies auditory hallucinations. Patient denies visual hallucinations. Patient denies symptoms of paranoia. Patient states sleep is "fair to good", with approximately 1-9 hours of sleep per night. Appetite is fair. Energy level is fair. Patient denies symptoms of anhedonia. Patient denies hopelessness, helplessness, or guilt.   . Severity: Mild to Moderate . Duration; More than 10 years.  . Timing: No specified timing. . Context:  Interactions with her mother. Her friend moving away. . Modifying factors: Mood has  Improved with a combination of individual therapy and her current medications, as well as volunteering.  . Associated signs and symptoms:  Denies any recent episodes consistent with mania, particularly decreased need for sleep with increased energy, grandiosity, impulsivity, hyperverbal and pressured speech, or increased productivity. Denies any recent symptoms consistent with psychosis,  particularly auditory or visual hallucinations, thought broadcasting/insertion/withdrawal, or ideas of reference. She reports excessive worry to the point of physical symptoms but denies panic attacks.She reports a history of trauma or symptoms consistent with PTSD such as flashbacks, nightmares, hypervigilance, feelings of numbness or inability to connect with others.     History of Present Illness: Suicidal Ideation: Negative Plan Formed: Negative Patient has means to carry out plan: Negative  Homicidal Ideation: Negative Plan Formed: Negative Patient has means to carry out plan: Negative  Review of Systems: Psychiatric: Agitation: Negative Hallucination: Negative Depressed Mood: Negative Insomnia: Negative Hypersomnia: Negative Altered Concentration: Negative Feels Worthless: Negative Grandiose Ideas: Negative Belief In Special Powers: Negative New/Increased Substance Abuse: Negative Compulsions: Negative  Neurologic: Headache: Negative Seizure: Negative Paresthesias: Yes-in both legs  Past Medical Family, Social History:  Past Medical History  Diagnosis Date  . Depression   . Anxiety   . Asthma   . Diabetes mellitus type II   . Insomnia   . PTSD (post-traumatic stress disorder)   . High blood pressure   . PCOS (polycystic ovarian syndrome)   . Thyroid disease   . IBS (irritable bowel syndrome)   . Food intolerance   . Seasonal allergies    Family History  Problem Relation Age of Onset  . Bipolar disorder Mother   . Hypertension Mother   . Diabetes Mother   . Alzheimer's disease Father   . Hypertension Father   . Alzheimer's disease Maternal Grandmother    History   Social History  . Marital Status: Single    Spouse Name: N/A    Number of Children: N/A  . Years of Education: N/A   Occupational History  . Not on file.   Social History  Main Topics  . Smoking status: Never Smoker   . Smokeless tobacco: Not on file  . Alcohol Use: 0.0 oz/week    0.5  drink(s) per week     Comment: 1-2 drinks a month  . Drug Use: No  . Sexual Activity: Not Currently    Birth Control/ Protection: Pill   Other Topics Concern  . Not on file   Social History Narrative   Regular exercise: yes, walk   Caffeine use: occasionally tea          Outpatient Encounter Prescriptions as of 09/23/2013  Medication Sig  . albuterol (PROVENTIL HFA;VENTOLIN HFA) 108 (90 BASE) MCG/ACT inhaler Use 2 inhalations tid as needed for wheezing  . atenolol (TENORMIN) 50 MG tablet Take 2 tablets in the morning and 2 tablets in the evening  . cholestyramine (QUESTRAN) 4 G packet TAKE 1 PACKET BY MOUTH DAILY.  . clonazePAM (KLONOPIN) 0.5 MG tablet TAKE ONE-HALF TABLET BY MOUTH AT BEDTIME  . CYCLOSET 0.8 MG TABS TAKE 2 TABLETS BY MOUTH IN AM DAILY (START AT 0.8 MG DAILY FOR AT LEAST 1 WEEK)  . fexofenadine (ALLEGRA) 180 MG tablet Take 180 mg by mouth daily.  . fluticasone (FLONASE) 50 MCG/ACT nasal spray PLACE 2 SPRAYS INTO THE NOSE DAILY.  . fluticasone (FLONASE) 50 MCG/ACT nasal spray PLACE 1 SPRAY INTO THE NOSE DAILY.  Marland Kitchen glucose blood (ONE TOUCH TEST STRIPS) test strip Test blood glucose 2 times daily as instructed. Dx code: 250.00  . lansoprazole (PREVACID) 30 MG capsule Take 1 capsule (30 mg total) by mouth daily.  . naproxen (NAPROSYN) 500 MG tablet Take 1 tablet (500 mg total) by mouth 2 (two) times daily with a meal.  . OVER THE COUNTER MEDICATION 1 capsule daily. Probiotic renew life  . sertraline (ZOLOFT) 100 MG tablet TAKE 2 TABLETS BY MOUTH EVERY DAY  . XULANE 150-35 MCG/24HR transdermal patch   . zolpidem (AMBIEN CR) 6.25 MG CR tablet Take 1 tablet (6.25 mg total) by mouth at bedtime as needed.    Past Psychiatric History/Hospitalization(s): Anxiety: Negative Bipolar Disorder: Negative Depression: Negative Mania: Negative Psychosis: Negative Schizophrenia: Negative Personality Disorder: Negative Hospitalization for psychiatric illness: No History of  Electroconvulsive Shock Therapy: No Prior Suicide Attempts: Negative  Physical Exam: Constitutional:  BP 124/63  Pulse 60  Wt 164 lb (74.39 kg)  LMP 09/23/2013  Review of Systems  Constitutional: Negative for fever, chills and weight loss.  Eyes: Negative for blurred vision and double vision.  Respiratory: Negative for cough, sputum production and shortness of breath (Does have exaccerbations.).   Cardiovascular: Negative for chest pain, palpitations and leg swelling.  Gastrointestinal: Positive for heartburn, nausea and diarrhea. Negative for vomiting, abdominal pain, constipation and blood in stool.  Neurological: Negative for dizziness, tremors, sensory change, seizures, loss of consciousness and headaches.   Physical Exam  Vitals reviewed. Constitutional: She appears well-developed and well-nourished. No distress.  Skin: She is not diaphoretic.    General Appearance: alert, oriented, no acute distress and well nourished Musculoskeletal: Gait & Station: normal Patient leans: N/A  Psychiatric: General Appearance: Casual and Well Groomed  Patent attorney::  Good  Speech:  Clear and Coherent and Normal Rate  Volume:  Normal  Mood:  "fine"  Affect:  Appropriate, Congruent and Full Range  Thought Process:  Coherent, Linear and Logical  Orientation:  Full (Time, Place, and Person)  Thought Content:  WDL  Suicidal Thoughts:  No  Homicidal Thoughts:  No  Memory:  Immediate;  Good Recent;   Good Remote;   Good  Judgement:  Fair  Insight:  Fair  Psychomotor Activity:  Normal  Concentration:  Good  Recall:  Good  Akathisia:  Negative  Handed:  Right  AIMS (if indicated):     Assets:  Desire for Improvement Housing  Sleep:  Number of Hours: 1-9 hours     Medical Decision Making (Choose Three): Established Problem, Stable/Improving (1), Review of Psycho-Social Stressors (1), Order AIMS Test (2), Established Problem, Worsening (2), Review of Medication Regimen & Side  Effects (2) and Review of New Medication or Change in Dosage (2)  Assessment: Axis I: Post traumatic stress Disorder, Major Depressive Disorder, recurrent, severe    Plan:   Plan of Care:  PLAN:  1. Affirm with the patient that the medications are taken as ordered. Patient  expressed understanding of how their medications were to be used.    Laboratory:  No labs warranted at this time.   Psychotherapy: Therapy: brief supportive therapy provided.  Discussed psychosocial stressors in detail.  Will refer to individual therapy. Continue individual therapy.  Medications:  Continue  the following psychiatric medications as written prior to this appointment with the following changes::  a) Clonazepam 0.125 mg BID-Discussed concerns about long term use of clonazepam, and made patient aware of risks of the same. b) Sertraline 100 mg-200 mg daily.  -Risks and benefits, side effects and alternatives discussed with patient, she was given an opportunity to ask questions about his/her medication, illness, and treatment. All current psychiatric medications have been reviewed and discussed with the patient and adjusted as clinically appropriate. The patient has been provided an accurate and updated list of the medications being now prescribed.   Routine PRN Medications:  Negative  Consultations: The patient was encouraged to keep all PCP and specialty clinic appointments.   Safety Concerns:   Patient told to call clinic if any problems occur. Patient advised to go to  ER  if she should develop SI/HI, side effects, or if symptoms worsen. Has crisis numbers to call if needed.    Other:   8. Patient was instructed to return to clinic in 3 months. 9. The patient was advised to call and cancel their mental health appointment within 24 hours of the appointment, if they are unable to keep the appointment, as well as the three no show and termination from clinic policy. 10. The patient expressed understanding  of the plan and agrees with the above.  Time Spent: 25 minutes  Jacqulyn Cane, M.D.  09/23/2013 2:05 PM

## 2013-09-29 ENCOUNTER — Ambulatory Visit: Payer: Self-pay | Admitting: Internal Medicine

## 2013-10-05 ENCOUNTER — Other Ambulatory Visit: Payer: Self-pay | Admitting: Internal Medicine

## 2013-10-06 ENCOUNTER — Ambulatory Visit (INDEPENDENT_AMBULATORY_CARE_PROVIDER_SITE_OTHER): Payer: Medicare Other | Admitting: Internal Medicine

## 2013-10-06 ENCOUNTER — Encounter: Payer: Self-pay | Admitting: Internal Medicine

## 2013-10-06 VITALS — BP 139/73 | HR 69 | Temp 98.1°F | Resp 18 | Wt 162.0 lb

## 2013-10-06 DIAGNOSIS — F4310 Post-traumatic stress disorder, unspecified: Secondary | ICD-10-CM

## 2013-10-06 DIAGNOSIS — I10 Essential (primary) hypertension: Secondary | ICD-10-CM

## 2013-10-06 DIAGNOSIS — F332 Major depressive disorder, recurrent severe without psychotic features: Secondary | ICD-10-CM

## 2013-10-06 NOTE — Patient Instructions (Signed)
See me in 3 months

## 2013-10-06 NOTE — Progress Notes (Signed)
Subjective:    Patient ID: [name], female    DOB: 08-16-1966, 48 y.o.   MRN: [id number]  HPI [name] is here for follow up  .  Seh tells she is taking her Atenolon 2 tabs in am and she has started back on Irbesartan.  She only take atenolol in evening "if I need it"  Last visit she asked if she could stop irbesartan.    She is upset and stressed as her psychiatrist  Dr. Christell Constant has moved and she has a new one who she is trying to get to know.    Allergies  Allergen Reactions  . Latex Itching and Swelling  . Penicillins Other (See Comments)    dizzy   Past Medical History  Diagnosis Date  . Depression   . Anxiety   . Asthma   . Diabetes mellitus type II   . Insomnia   . PTSD (post-traumatic stress disorder)   . High blood pressure   . PCOS (polycystic ovarian syndrome)   . Thyroid disease   . IBS (irritable bowel syndrome)   . Food intolerance   . Seasonal allergies    Past Surgical History  Procedure Laterality Date  . Cholecystectomy     History   Social History  . Marital Status: Single    Spouse Name: N/A    Number of Children: N/A  . Years of Education: N/A   Occupational History  . Not on file.   Social History Main Topics  . Smoking status: Never Smoker   . Smokeless tobacco: Not on file  . Alcohol Use: 0.0 oz/week    0.5 drink(s) per week     Comment: 1-2 drinks a month  . Drug Use: No  . Sexual Activity: Not Currently    Birth Control/ Protection: Pill   Other Topics Concern  . Not on file   Social History Narrative   Regular exercise: yes, walk   Caffeine use: occasionally tea         Family History  Problem Relation Age of Onset  . Bipolar disorder Mother   . Hypertension Mother   . Diabetes Mother   . Alzheimer's disease Father   . Hypertension Father   . Alzheimer's disease Maternal Grandmother    Patient Active Problem List   Diagnosis Date Noted  . Hyperlipidemia 08/18/2013  . Diarrhea 09/01/2012  . Essential  hypertension 08/26/2012  . Irregular menses 08/07/2012  . Essential hypertension, benign 08/07/2012  . History of abnormal Pap smear 08/07/2012  . 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 GERD (gastroesophageal reflux disease) 08/07/2012  . Asthma 08/07/2012  . History of migraine 08/07/2012  . History of PCOS 08/07/2012  . Genital warts 08/07/2012  . S/P cholecystectomy 08/07/2012  . Allergic rhinitis 08/07/2012  . Major depressive disorder, recurrent episode, severe, without mention of psychotic behavior 12/06/2011  . PTSD (post-traumatic stress disorder) 11/02/2011   Current Outpatient Prescriptions on File Prior to Visit  Medication Sig Dispense Refill  . albuterol (PROVENTIL HFA;VENTOLIN HFA) 108 (90 BASE) MCG/ACT inhaler Use 2 inhalations tid as needed for wheezing  3 Inhaler  0  . atenolol (TENORMIN) 50 MG tablet Take 2 tablets in the morning and 2 tablets in the evening  360 tablet  0  . cholestyramine (QUESTRAN) 4 G packet TAKE 1 PACKET BY MOUTH DAILY.  60 packet  1  . clonazePAM (KLONOPIN) 0.5 MG tablet TAKE ONE-HALF TABLET BY MOUTH AT BEDTIME  15 tablet  2  . CYCLOSET 0.8 MG TABS TAKE 2 TABLETS BY MOUTH IN AM DAILY (START AT 0.8 MG DAILY FOR AT LEAST 1 WEEK)  60 tablet  1  . fexofenadine (ALLEGRA) 180 MG tablet Take 180 mg by mouth daily.      . fluticasone (FLONASE) 50 MCG/ACT nasal spray PLACE 2 SPRAYS INTO THE NOSE DAILY.      Marland Kitchen. irbesartan (AVAPRO) 75 MG tablet Take 75 mg by mouth daily.      . lansoprazole (PREVACID) 30 MG capsule Take 1 capsule (30 mg total) by mouth daily.  30 capsule  5  . naproxen (NAPROSYN) 500 MG tablet Take 1 tablet (500 mg total) by mouth 2 (two) times daily with a meal.  60 tablet  0  . OVER THE COUNTER MEDICATION 1 capsule daily. Probiotic renew life      . sertraline (ZOLOFT) 100 MG tablet Take 2 tablets (200 mg total) by mouth daily.  60 tablet  3  . XULANE 150-35 MCG/24HR transdermal patch       . fluticasone (FLONASE) 50 MCG/ACT nasal  spray PLACE 1 SPRAY INTO THE NOSE DAILY.  16 g  0  . [DISCONTINUED] EFFEXOR XR 150 MG 24 hr capsule 375 mg daily.        No current facility-administered medications on file prior to visit.       Review of Systems See HPI    Objective:   Physical Exam Physical Exam  Nursing note and vitals reviewed.  Constitutional: She is oriented to person, place, and time. She appears well-developed and well-nourished.  HENT:  Head: Normocephalic and atraumatic.  Cardiovascular: Normal rate and regular rhythm. Exam reveals no gallop and no friction rub.  No murmur heard.  Pulmonary/Chest: Breath sounds normal. She has no wheezes. She has no rales.  Neurological: She is alert and oriented to person, place, and time.  Skin: Skin is warm and dry.  Psychiatric: She has a normal mood and affect. Her behavior is normal.              Assessment & Plan:  HTN  OK to take Atenolol 2 tabs in am and continue with irbesartan  Diabetes managed with Dr. . Elvera LennoxGherghe  PTSD  Continue with meds.    See me in 3months or sooner prn

## 2013-10-07 ENCOUNTER — Ambulatory Visit (INDEPENDENT_AMBULATORY_CARE_PROVIDER_SITE_OTHER): Payer: Medicare Other | Admitting: Licensed Clinical Social Worker

## 2013-10-07 ENCOUNTER — Other Ambulatory Visit: Payer: Self-pay | Admitting: *Deleted

## 2013-10-07 DIAGNOSIS — F332 Major depressive disorder, recurrent severe without psychotic features: Secondary | ICD-10-CM

## 2013-10-07 DIAGNOSIS — F4310 Post-traumatic stress disorder, unspecified: Secondary | ICD-10-CM | POA: Diagnosis not present

## 2013-10-07 MED ORDER — FLUTICASONE PROPIONATE 50 MCG/ACT NA SUSP: NASAL | Status: DC

## 2013-10-07 NOTE — Progress Notes (Signed)
   THERAPIST PROGRESS NOTE  Session Time: 1:00pm-1:50pm  Participation Level: Active  Behavioral Response: Well GroomedAlertEuthymic  Type of Therapy: Individual Therapy  Treatment Goals addressed: Coping  Interventions: Strength-based and Supportive  Summary: [name] is a 48 y.o. female who presents with euthymic mood and bright affect. she reports continued improvement in her mood with well controlled depression and anxiety. 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 She reports feeling content in her life and is pleased by the progress she has made. Informed patient of the writers departure and processed her progress throughout treatment.    Suicidal/Homicidal: Nowithout intent/plan  Therapist Response: Assessed patients current functioning and reviewed progress. Reviewed coping strategies. Assessed patients safety and assisted in identifying protective factors.  Reviewed crisis plan with patient.. Reviewed patients self care plan. Assessed progress related to self care. Patients self care is good. Recommend daily exercise, increased socialization and recreation. Used CBT to assist patient with the identification of negative distortions and irrational thoughts. Encouraged patient to verbalize alternative and factual responses which challenge thought distortions.    Plan: Return again in four weeks [name]   Diagnosis: Axis I: Major Depression, Recurrent severe    Axis II: No diagnosis    Abigail Shadoan, [name] 10/07/2013

## 2013-10-08 ENCOUNTER — Telehealth: Payer: Self-pay | Admitting: *Deleted

## 2013-10-08 NOTE — Telephone Encounter (Signed)
[name] called questioning new directions on her Flonase.  Dr Constance GoltzSchoenhoff said she does not want [name] to have Flonase daily as it is not a forever medication.  It can thin mucosa and has risk of perforation. I called [name] and left her a message with this information.  Told her to call us with any questions.

## 2013-10-09 ENCOUNTER — Telehealth: Payer: Self-pay | Admitting: *Deleted

## 2013-10-09 NOTE — Telephone Encounter (Signed)
LVM message for pt to return call  

## 2013-10-12 ENCOUNTER — Telehealth: Payer: Self-pay | Admitting: Internal Medicine

## 2013-10-12 NOTE — Telephone Encounter (Signed)
I left message for pt  -  I was returning her phone call.  Left voicemail

## 2013-10-20 ENCOUNTER — Ambulatory Visit (INDEPENDENT_AMBULATORY_CARE_PROVIDER_SITE_OTHER): Payer: Medicare Other | Admitting: Internal Medicine

## 2013-10-20 ENCOUNTER — Encounter: Payer: Self-pay | Admitting: Internal Medicine

## 2013-10-20 VITALS — BP 102/68 | HR 65 | Temp 98.5°F | Resp 12 | Wt 162.0 lb

## 2013-10-20 DIAGNOSIS — E139 Other specified diabetes mellitus without complications: Secondary | ICD-10-CM

## 2013-10-20 DIAGNOSIS — E119 Type 2 diabetes mellitus without complications: Secondary | ICD-10-CM | POA: Diagnosis not present

## 2013-10-20 NOTE — Patient Instructions (Signed)
Please come back for a follow-up appointment in 3 months - with your sugar log. Stay on the same dose of Cycloset. Stop at the lab.

## 2013-10-20 NOTE — Progress Notes (Addendum)
Patient ID: [name], female   DOB: 1965/11/18, 48 y.o.   MRN: [id number]  HPI: [name] is a 48 y.o.-year-old female, returning for f/u for DM2, dx 2013, non-insulin-dependent, well controlled, without complications. Last visit 3 mo ago.  Since last visit, she developed sciatica in R leg >> sees acupuncture.   Last hemoglobin A1c was: Lab Results  Component Value Date   HGBA1C 6.9* 07/10/2013   HGBA1C 7.1* 02/26/2013   HGBA1C 7.4* 08/07/2012   Pt is on a regimen of: - Cycloset - 1.6 mg daily (increased from 0.8 at last visit)  She had various intolerances to different diabetes medications:  Metformin ER >> gastric discomfort and severe diarrhea (loss of bowel control). Glipizide: low CBGs, eating more, weight gain  Januvia >> tried for 1-2 months >> left chest pain  She was wondering if she could take Invokana, however she had multiple vaginal yeast infections 1-2 years ago. She has to urinate 3-4 x a night.   At last visit, she described reactive hypoglycemia symptoms >> I advised her to stop Quercetin (was taking it for allergies), as it is known to increase insulin production by the pancreas.  Pt checks her sugars 2-3 a week and they are - per her log: - am: 150-155 >> 150-155 >> 111-130 (150 when eating desert the night before) >> 100-134 - later in the day it can go up to 200-250 >> 140-180 (higher around Christmastime) No lows. Lowest sugar was 86; she has hypoglycemia awareness but unknown at what level. Highest sugar was 250  - more around Christmas.  Has many food intolerances: - wheat >> cannot eat bread or pastry.  - raw vegetables, beans, cruciferous vegetables, fruit >> diarrhea.  - No dairy - She does not digest fat well after her cholecystectomy in 2011. She is on Questran, otw gets more severe diarrhea. Takes Questran at noon. She sticks to lean proteins: fish, [place]key, chicken, squash. She does not skip meals.   She cut down sodas, candy. She started  to go to a gym today >> will go 2x a day.   - No chronic kidney disease, last BUN/creatinine was:  Lab Results  Component Value Date   BUN 9 02/26/2013   CREATININE 0.65 02/26/2013  On Irbesartan.  - has a h/o HL - Last set of lipids shown below. She is on Questran. Lab Results  Component Value Date   CHOL 292* 02/26/2013   HDL 98 02/26/2013   LDLCALC 152* 02/26/2013   TRIG 211* 02/26/2013   CHOLHDL 3.0 02/26/2013  Previous cholesterol levels were: 325/197/132/154. She would like to avoid statins as she was told these can increase depression.  - last eye exam was in >1 year ago, optometrist >> had an appt with Dr [name] on 07/20/2013, she missed that appt >> would like to see a general ophthalmologist not a retina specialist - no numbness and tingling in her legs.  She also has a history of PCOS - she [facility] in the past - note from 08/26/2012: perimenopause, and at that time she was on Yasmin, subsequently changed to Necon. She sees Dr. Marvel PlanMichelle Russell with OB/GYN. She has PTSD, MDD, insomnia, anxiety, asthma, HTN, GERD, status post cholecystectomy, anemia, menorrhagia, improved a little on new OCP, transaminitis.  I reviewed pt's medications, allergies, PMH, social hx, family hx and no changes required, except as mentioned above.  ROS: Constitutional: no weight gain/loss, no fatigue, no subjective hyperthermia/hypothermia Eyes:  no blurry vision, no xerophthalmia ENT: no sore throat, no nodules palpated in throat, + dysphagia/no odynophagia, no hoarseness Cardiovascular: no CP/SOB/palpitations/leg swelling Respiratory: no cough/SOB Gastrointestinal: no N/V/D/C Musculoskeletal: + muscle aches/no joint aches, + R leg pain (sciatica) - left Skin: no rashes  PE: BP 102/68  Pulse 65  Temp(Src) 98.5 F (36.9 C) (Oral)  Resp 12  Wt 162 lb (73.483 kg)  SpO2 97%  LMP 09/21/2013 Wt Readings from Last 3 Encounters:  10/20/13 162 lb (73.483 kg)  10/06/13 162 lb  (73.483 kg)  09/23/13 164 lb (74.39 kg)   Constitutional: overweight, truncal obesity, in NAD Eyes: PERRLA, EOMI, no exophthalmos ENT: moist mucous membranes, no thyromegaly, no cervical lymphadenopathy Cardiovascular: RRR, 2/6 SEM, no MRG Respiratory: CTA B Gastrointestinal: abdomen soft, NT, ND, BS+ Musculoskeletal: no deformities, strength intact in all 4 Skin: moist, warm, no rashes  ASSESSMENT: 1. DM2, non-insulin-dependent, well controlled, without complications  PLAN:  1. DM2 Patient with recently diagnosed DM 2, with a recent hemoglobin A1c that is close to target, at 6.9%.  She has several intolerances to diabetic meds - metformin (even extended release), Januvia, glipizide. We cannot use Invokana due to h/o UTIs, and her nocturia. She did a great job recently with improving her diet and introducing exercise. She also brings a sugar log. - since sugars improved, and she tolerates it well, continue bromocriptine 1.6 mg daily  - given new sugar logs, continue to rotate checks - I advised her to have a yearly eye exam >> referred her to dr Abigail Royalty at last visit, but will refer to Dr [name] since she would like a general ophthalmologist - will check HbA1C - return to clinic in 3 months with her sugar log  Lab Results  Component Value Date   HGBA1C 6.6* 10/21/2013   Excellent!

## 2013-10-21 ENCOUNTER — Ambulatory Visit: Payer: Medicare Other

## 2013-10-21 DIAGNOSIS — E119 Type 2 diabetes mellitus without complications: Secondary | ICD-10-CM

## 2013-10-21 LAB — HEMOGLOBIN A1C: HEMOGLOBIN A1C: 6.6 % — AB (ref 4.6–6.5)

## 2013-11-03 ENCOUNTER — Other Ambulatory Visit: Payer: Self-pay | Admitting: Internal Medicine

## 2013-11-04 ENCOUNTER — Ambulatory Visit (HOSPITAL_COMMUNITY): Payer: Self-pay | Admitting: Licensed Clinical Social Worker

## 2013-11-06 ENCOUNTER — Ambulatory Visit (INDEPENDENT_AMBULATORY_CARE_PROVIDER_SITE_OTHER): Payer: Medicare Other | Admitting: Internal Medicine

## 2013-11-06 ENCOUNTER — Other Ambulatory Visit: Payer: Medicare Other

## 2013-11-06 ENCOUNTER — Encounter: Payer: Self-pay | Admitting: Internal Medicine

## 2013-11-06 VITALS — BP 120/78 | HR 65 | Ht 62.0 in | Wt 164.0 lb

## 2013-11-06 DIAGNOSIS — J45909 Unspecified asthma, uncomplicated: Secondary | ICD-10-CM

## 2013-11-06 DIAGNOSIS — J309 Allergic rhinitis, unspecified: Secondary | ICD-10-CM | POA: Diagnosis not present

## 2013-11-06 MED ORDER — FLUTICASONE PROPIONATE 50 MCG/ACT NA SUSP: NASAL | Status: DC

## 2013-11-06 MED ORDER — ALBUTEROL SULFATE HFA 108 (90 BASE) MCG/ACT IN AERS: INHALATION_SPRAY | RESPIRATORY_TRACT | Status: DC

## 2013-11-06 MED ORDER — CROMOLYN SODIUM 5.2 MG/ACT NA AERS: 1.0000 | INHALATION_SPRAY | Freq: Four times a day (QID) | NASAL | Status: DC

## 2013-11-06 MED ORDER — FEXOFENADINE HCL 180 MG PO TABS: 180.0000 mg | ORAL_TABLET | Freq: Every day | ORAL | Status: DC

## 2013-11-06 NOTE — Patient Instructions (Signed)
Order- Allergy Profile     Dx allergic rhinitis, allergic asthma  Scripts sent for flonase and allegra  Script printed to try Nasalcrom/ Cromol/ cromolyn  Please call as needed

## 2013-11-06 NOTE — Progress Notes (Signed)
11/06/13- 48 yoF never smoker self-referred for allergy.  She has had perennial allergic rhinitis since childhood, improved during her teens but worse again since age 48. Triggers have included pollens, house dust, dry or humid air, strong odors and irritants. Previous allergy testing at [facility] in [place] and again in [place] around 2000 and. She members positive reactions to tree pollens, grass pollens, ragweed. She was on allergy shots for 2 years and thinks that helped her, but she had large local reactions. She has tried to minimize use of Flonase, but says if she skips it, symptoms flare. She prefers Careers adviser as an antihistamine. Singulair caused bad dreams. Multiple food intolerances-peanuts, citrus, dairy, weak/gluten-all causing your double bowel symptoms. She says celiac disease was ruled out. Intolerance to latex but not contrast dye or aspirin. Treated with prednisone 17 years ago for urticaria. Never stung by insect. 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 Lives in a townhouse with air filters and and encasings on bedding. CAC. 2 dogs, 2 cats, no mold. Brother had anaphylactic reaction from yellow jacket sting. Both parents have "allergies".  Prior to Admission medications   Medication Sig Start Date End Date Taking? Authorizing Provider  albuterol (PROVENTIL HFA;VENTOLIN HFA) 108 (90 BASE) MCG/ACT inhaler Use 2 inhalations tid as needed for wheezing 11/06/13  Yes Waymon Budge, MD  atenolol (TENORMIN) 50 MG tablet Take 2 tablets in the morning and 2 tablets in the evening 08/19/13  Yes Kendrick Ranch, MD  clonazePAM (KLONOPIN) 0.5 MG tablet TAKE ONE-HALF TABLET BY MOUTH AT BEDTIME 12/22/12  Yes Jamse Mead, MD  fexofenadine (ALLEGRA) 180 MG tablet Take 1 tablet (180 mg total) by mouth daily. 11/06/13  Yes Waymon Budge, MD  fluticasone (FLONASE) 50 MCG/ACT nasal spray 1-2 puffs each nostril once daily 11/06/13  Yes Clinton D Young, MD  irbesartan (AVAPRO) 75 MG  tablet Take 75 mg by mouth daily. 09/15/13  Yes Historical Provider, MD  naproxen (NAPROSYN) 500 MG tablet Take 1 tablet (500 mg total) by mouth 2 (two) times daily with a meal. 07/27/13  Yes Kendrick Ranch, MD  OVER THE COUNTER MEDICATION 1 capsule daily. Probiotic renew life   Yes Historical Provider, MD  sertraline (ZOLOFT) 100 MG tablet Take 2 tablets (200 mg total) by mouth daily. 09/23/13 09/23/14 Yes Larena Sox, MD  Burr Medico 150-35 MCG/24HR transdermal patch  07/03/13  Yes Historical Provider, MD  cholestyramine Lanetta Inch) 4 G packet TAKE 1 PACKET BY MOUTH DAILY. 05/23/13   Kendrick Ranch, MD  cromolyn (NASALCROM) 5.2 MG/ACT nasal spray Place 1 spray into both nostrils 4 (four) times daily. 11/06/13   Waymon Budge, MD  CYCLOSET 0.8 MG TABS TAKE 2 TABLETS BY MOUTH IN AM DAILY (START AT 0.8 MG DAILY FOR AT LEAST 1 WEEK) 11/17/13   Carlus Pavlov, MD  lansoprazole (PREVACID) 30 MG capsule TAKE 1 CAPSULE (30 MG TOTAL) BY MOUTH DAILY. 11/09/13   Kendrick Ranch, MD   Past Medical History  Diagnosis Date  . Depression   . Anxiety   . Asthma   . Diabetes mellitus type II   . Insomnia   . PTSD (post-traumatic stress disorder)   . High blood pressure   . PCOS (polycystic ovarian syndrome)   . Thyroid disease   . IBS (irritable bowel syndrome)   . Food intolerance   . Seasonal allergies    Past Surgical History  Procedure Laterality Date  . Cholecystectomy    . Gallbladder surgery  Family History  Problem Relation Age of Onset  . Bipolar disorder Mother   . Hypertension Mother   . Diabetes Mother   . Alzheimer's disease Father   . Hypertension Father   . Alzheimer's disease Maternal Grandmother   . Heart disease Paternal Grandfather    History   Social History  . Marital Status: Single    Spouse Name: N/A    Number of Children: N/A  . Years of Education: N/A   Occupational History  . Not on file.   Social History Main Topics  . Smoking status: Never  Smoker   . Smokeless tobacco: Not on file  . Alcohol Use: 0.0 oz/week    0.5 drink(s) per week     Comment: 1-2 drinks a month  . Drug Use: No  . Sexual Activity: Not Currently    Birth Control/ Protection: Pill   Other Topics Concern  . Not on file   Social History Narrative   Regular exercise: yes, walk   Caffeine use: occasionally tea         ROS-see HPI Constitutional:   No-   weight loss, night sweats, fevers, chills, fatigue, lassitude. HEENT:   +  headaches, +difficulty swallowing, tooth/dental problems, sore throat,       No-  +sneezing,+ itching, no-ear ache,+nasal congestion, post nasal drip,  CV:  No-   chest pain, orthopnea, PND, swelling in lower extremities, anasarca,                                  dizziness, palpitations Resp: +shortness of breath with exertion or at rest.              No-   productive cough,  No non-productive cough,  No- coughing up of blood.              No-   change in color of mucus.  No- wheezing.   Skin: No-   rash or lesions. GI:  +heartburn, indigestion, +abdominal pain, nausea, vomiting, diarrhea,                 change in bowel habits, loss of appetite GU: No-   dysuria, change in color of urine, no urgency or frequency.  No- flank pain. MS: + joint pain or swelling.  No- decreased range of motion.  No- back pain. Neuro-     nothing unusual Psych:  No- change in mood or affect. No depression or anxiety.  No memory loss.  OBJ- Physical Exam General- Alert, Oriented, Affect-appropriate, Distress- none acute Skin- rash-none, lesions- none, excoriation- none Lymphadenopathy- none Head- atraumatic            Eyes- Gross vision intact, PERRLA, conjunctivae and secretions clear            Ears- Hearing, canals-normal            Nose- Clear, no-Septal dev, +mucus bridging, polyps, erosion, perforation             Throat- Mallampati II , mucosa clear , drainage- none, tonsils- atrophic Neck- flexible , trachea midline, no stridor ,  thyroid nl, carotid no bruit Chest - symmetrical excursion , unlabored           Heart/CV- RRR , no murmur , no gallop  , no rub, nl s1 s2                           -  JVD- none , edema- none, stasis changes- none, varices- none           Lung- clear to P&A, wheeze- none, cough- none , dullness-none, rub- none           Chest wall-  Abd- tender-no, distended-no, bowel sounds-present, HSM- no Br/ Gen/ Rectal- Not done, not indicated Extrem- cyanosis- none, clubbing, none, atrophy- none, strength- nl Neuro- grossly intact to observation

## 2013-11-09 ENCOUNTER — Other Ambulatory Visit: Payer: Self-pay | Admitting: Internal Medicine

## 2013-11-09 LAB — ALLERGY FULL PROFILE
Allergen, D pternoyssinus,d7: 0.21 kU/L — ABNORMAL HIGH
Allergen,Goose feathers, e70: <0.1 kU/L
Alternaria Alternata: <0.1 kU/L
Aspergillus fumigatus, m3: <0.1 kU/L
BAHIA GRASS: 0.38 kU/L — AB
BOX ELDER: 0.57 kU/L — AB
Bermuda Grass: 0.29 kU/L — ABNORMAL HIGH
Candida Albicans: <0.1 kU/L
Cat Dander: 21.5 kU/L — ABNORMAL HIGH
Common Ragweed: 1.16 kU/L — ABNORMAL HIGH
D. farinae: 0.12 kU/L — ABNORMAL HIGH
DOG DANDER: 16.8 kU/L — AB
Elm IgE: 1.35 kU/L — ABNORMAL HIGH
FESCUE: 0.46 kU/L — AB
G005 RYE, PERENNIAL: 0.44 kU/L — AB
G009 Red Top: 0.27 kU/L — ABNORMAL HIGH
Goldenrod: 0.41 kU/L — ABNORMAL HIGH
Helminthosporium halodes: <0.1 kU/L
House Dust Hollister: 10.8 kU/L — ABNORMAL HIGH
IGE (IMMUNOGLOBULIN E), SERUM: 651.9 [IU]/mL — AB (ref 0.0–180.0)
Lamb's Quarters: 0.66 kU/L — ABNORMAL HIGH
Oak: 2.39 kU/L — ABNORMAL HIGH
Plantain: 0.44 kU/L — ABNORMAL HIGH
SYCAMORE TREE: 0.4 kU/L — AB
Stemphylium Botryosum: <0.1 kU/L
Timothy Grass: 0.36 kU/L — ABNORMAL HIGH

## 2013-11-09 NOTE — Telephone Encounter (Signed)
Refill request

## 2013-11-10 ENCOUNTER — Telehealth: Payer: Self-pay | Admitting: Internal Medicine

## 2013-11-10 NOTE — Telephone Encounter (Signed)
Attempted to call x1 LMTCB 

## 2013-11-11 NOTE — Telephone Encounter (Signed)
Notes Recorded by Hermelinda MedicusMeghan A Lawrence on 11/10/2013 at 8:34 AM Attempted to call x1 LMTCB ------  Notes Recorded by Waymon Budgelinton D Young, MD on 11/09/2013 at 5:34 PM Allergy profile- elevated allergy antibody levels especially for grass and trree pollens. We can discuss at next ov.  Pt advised via voicemail. Carron CurieJennifer Castillo, CMA

## 2013-11-17 ENCOUNTER — Other Ambulatory Visit: Payer: Self-pay | Admitting: Internal Medicine

## 2013-11-25 DIAGNOSIS — E119 Type 2 diabetes mellitus without complications: Secondary | ICD-10-CM | POA: Diagnosis not present

## 2013-11-25 DIAGNOSIS — H5210 Myopia, unspecified eye: Secondary | ICD-10-CM | POA: Diagnosis not present

## 2013-11-29 NOTE — Assessment & Plan Note (Signed)
She is convinced that she is dependent on Flonase. We discussed antihistamines, nasal steroids, environmental controls. Plan-lab allergy profile. Consider skin testing if she looks like a candidate for allergy vaccine retry

## 2013-12-06 ENCOUNTER — Other Ambulatory Visit: Payer: Self-pay | Admitting: Internal Medicine

## 2013-12-07 NOTE — Telephone Encounter (Signed)
Refill request

## 2013-12-12 ENCOUNTER — Other Ambulatory Visit: Payer: Self-pay | Admitting: Internal Medicine

## 2013-12-14 NOTE — Telephone Encounter (Signed)
Refill request

## 2013-12-18 ENCOUNTER — Ambulatory Visit: Payer: Self-pay | Admitting: Internal Medicine

## 2013-12-22 ENCOUNTER — Ambulatory Visit (INDEPENDENT_AMBULATORY_CARE_PROVIDER_SITE_OTHER): Payer: Medicare Other | Admitting: Psychiatry

## 2013-12-22 ENCOUNTER — Encounter (HOSPITAL_COMMUNITY): Payer: Self-pay | Admitting: Psychiatry

## 2013-12-22 VITALS — BP 137/79 | HR 66 | Wt 164.0 lb

## 2013-12-22 DIAGNOSIS — F4310 Post-traumatic stress disorder, unspecified: Secondary | ICD-10-CM | POA: Diagnosis not present

## 2013-12-22 DIAGNOSIS — F332 Major depressive disorder, recurrent severe without psychotic features: Secondary | ICD-10-CM | POA: Diagnosis not present

## 2013-12-22 MED ORDER — SERTRALINE HCL 100 MG PO TABS: 200.0000 mg | ORAL_TABLET | Freq: Every day | ORAL | Status: DC

## 2013-12-22 MED ORDER — SERTRALINE HCL 100 MG PO TABS
200.0000 mg | ORAL_TABLET | Freq: Every day | ORAL | Status: DC
Start: 2013-12-22 — End: 2013-12-22

## 2013-12-22 NOTE — Progress Notes (Signed)
[facility] Follow-up Outpatient Visit  [name] 01/01/1966 [id number] 48 y.o.  12/22/2013 1:50 PM  Chief Complaint:  HPI Comments: [name] is  a 48 y/o female with a past psychiatric history significant for Post traumatic stress Disorder, Major Depressive Disorder, recurrent, severe. The patient is referred for psychiatric services for psychiatric evaluation and medication management.    . Location: The patient reports she has been doing better. . Quality: The patient reports that she is doing well and has stopped clonazepam.   In the area of affective symptoms, patient appears anxious. Patient denies current suicidal ideation, intent, or plan. Patient denies current homicidal ideation, intent, or plan. Patient denies auditory hallucinations. Patient denies visual hallucinations. Patient denies symptoms of paranoia. Patient states sleep is good with approximately 6-10  hours of sleep per night. Appetite is fair. Energy level is good. Patient denies symptoms of anhedonia. Patient denies hopelessness, helplessness, or guilt.   . Severity:  Depression: 6/10 (0=Very depressed; 5=Neutral; 10=Very Happy)  Anxiety- 3/10 (0=no anxiety; 5= moderate/tolerable anxiety; 10= panic attacks)   . Duration; More than 10 years.  . Timing: No specified timing.  . Context: No specific stressors.   . Modifying factors: Mood has  Improved with a combination of individual therapy and her current medications, as well as volunteering.  . Associated signs and symptoms:   Denies any recent episodes consistent with mania, particularly decreased need for sleep with increased energy, grandiosity, impulsivity, hyperverbal and pressured speech, or increased productivity. Denies any recent symptoms consistent with psychosis, particularly auditory or visual hallucinations, thought broadcasting/insertion/withdrawal, or ideas of reference. She reports excessive worry to the point of physical  symptoms but denies panic attacks.She reports a history of trauma or symptoms consistent with PTSD such as flashbacks, nightmares, hypervigilance, feelings of numbness or inability to connect with others.     History of Present Illness: Suicidal Ideation: Negative Plan Formed: Negative Patient has means to carry out plan: Negative  Homicidal Ideation: Negative Plan Formed: Negative Patient has means to carry out plan: Negative  Review of Systems: Psychiatric: Agitation: Negative Hallucination: Negative Depressed Mood: Negative Insomnia: Negative Hypersomnia: Negative Altered Concentration: Negative Feels Worthless: Negative Grandiose Ideas: Negative Belief In Special Powers: Negative New/Increased Substance Abuse: Negative Compulsions: Negative  Neurologic: Headache: Negative Seizure: Negative Paresthesias: Yes-in both legs  Past Medical Family, Social History:  Past Medical History  Diagnosis Date  . Depression   . Anxiety   . Asthma   . Diabetes mellitus type II   . Insomnia   . PTSD (post-traumatic stress disorder)   . High blood pressure   . PCOS (polycystic ovarian syndrome)   . Thyroid disease   . IBS (irritable bowel syndrome)   . Food intolerance   . Seasonal allergies    Family History  Problem Relation Age of Onset  . Bipolar disorder Mother   . Hypertension Mother   . Diabetes Mother   . Alzheimer's disease Father   . Hypertension Father   . Alzheimer's disease Maternal Grandmother   . Heart disease Paternal Grandfather    History   Social History  . Marital Status: Single    Spouse Name: N/A    Number of Children: N/A  . Years of Education: N/A   Occupational History  . Not on file.   Social History Main Topics  . Smoking status: Never Smoker   . Smokeless tobacco: Not on file  . Alcohol Use: 0.0 oz/week    0.5 drink(s) per  week     Comment: 1-2 drinks a month  . Drug Use: No  . Sexual Activity: Not Currently    Birth Control/  Protection: Pill   Other Topics Concern  . Not on file   Social History Narrative   Regular exercise: yes, walk   Caffeine use: occasionally tea          Outpatient Encounter Prescriptions as of 12/22/2013  Medication Sig  . albuterol (PROVENTIL HFA;VENTOLIN HFA) 108 (90 BASE) MCG/ACT inhaler Use 2 inhalations tid as needed for wheezing  . atenolol (TENORMIN) 50 MG tablet Take 2 tablets in the morning and 2 tablets in the evening  . cholestyramine (QUESTRAN) 4 G packet TAKE 1 PACKET BY MOUTH DAILY.  . clonazePAM (KLONOPIN) 0.5 MG tablet TAKE ONE-HALF TABLET BY MOUTH AT BEDTIME  . cromolyn (NASALCROM) 5.2 MG/ACT nasal spray Place 1 spray into both nostrils 4 (four) times daily.  . CYCLOSET 0.8 MG TABS TAKE 2 TABLETS BY MOUTH IN AM DAILY (START AT 0.8 MG DAILY FOR AT LEAST 1 WEEK)  . fexofenadine (ALLEGRA) 180 MG tablet Take 1 tablet (180 mg total) by mouth daily.  . fluticasone (FLONASE) 50 MCG/ACT nasal spray 1-2 puffs each nostril once daily  . irbesartan (AVAPRO) 75 MG tablet Take 75 mg by mouth daily.  . irbesartan (AVAPRO) 75 MG tablet TAKE 1 TABLET (75 MG TOTAL) BY MOUTH DAILY.  Marland Kitchen. lansoprazole (PREVACID) 30 MG capsule TAKE 1 CAPSULE (30 MG TOTAL) BY MOUTH DAILY.  . naproxen (NAPROSYN) 500 MG tablet Take 1 tablet (500 mg total) by mouth 2 (two) times daily with a meal.  . OVER THE COUNTER MEDICATION 1 capsule daily. Probiotic renew life  . sertraline (ZOLOFT) 100 MG tablet Take 2 tablets (200 mg total) by mouth daily.  Burr Medico. XULANE 150-35 MCG/24HR transdermal patch     Past Psychiatric History/Hospitalization(s): Anxiety: Negative Bipolar Disorder: Negative Depression: Negative Mania: Negative Psychosis: Negative Schizophrenia: Negative Personality Disorder: Negative Hospitalization for psychiatric illness: No History of Electroconvulsive Shock Therapy: No Prior Suicide Attempts: Negative  Physical Exam: Constitutional:  BP 137/79  Pulse 66  Wt 164 lb (74.39 kg)  LMP  12/22/2013  Review of Systems  Constitutional: Negative for fever, chills and weight loss.  Eyes: Negative for blurred vision and double vision.  Respiratory: Negative for cough, sputum production and shortness of breath (Does have exaccerbations.).   Cardiovascular: Negative for chest pain, palpitations and leg swelling.  Gastrointestinal: Positive for heartburn, nausea and diarrhea. Negative for vomiting, abdominal pain, constipation and blood in stool.  Neurological: Negative for dizziness, tremors, sensory change, seizures, loss of consciousness and headaches.   Physical Exam  Vitals reviewed. Constitutional: She appears well-developed and well-nourished. No distress.  Skin: She is not diaphoretic.    General Appearance: alert, oriented, no acute distress and well nourished Musculoskeletal: Gait & Station: normal Patient leans: N/A  Psychiatric Specialty exam: General Appearance: Casual and Well Groomed  Patent attorneyye Contact::  Good  Speech:  Clear and Coherent and Normal Rate  Volume:  Normal  Mood:  "okay"  Affect:  Appropriate, Congruent and Full Range  Thought Process:  Coherent, Linear and Logical  Orientation:  Full (Time, Place, and Person)  Thought Content:  WDL  Suicidal Thoughts:  No  Homicidal Thoughts:  No  Memory:  Immediate;   Good Recent;   Good Remote;   Good  Judgement:  Fair  Insight:  Fair  Psychomotor Activity:  Normal  Concentration:  Good  Recall:  Good  Akathisia:  Negative  Language-Intact  Fund of knowledge-Average  Handed:  Right  AIMS (if indicated):     Assets:  Desire for Improvement Housing  Sleep:  Number of Hours: 1-9 hours     Medical Decision Making (Choose Three): Established Problem, Stable/Improving (1), Review of Psycho-Social Stressors (1), Order AIMS Test (2), Established Problem, Worsening (2), Review of Medication Regimen & Side Effects (2) and Review of New Medication or Change in Dosage (2)  Assessment: Axis I: Post  traumatic stress Disorder-stable Major Depressive Disorder, recurrent, severe-stable     Plan of Care:  PLAN:  1. Affirm with the patient that the medications are taken as ordered. Patient  expressed understanding of how their medications were to be used.    Laboratory:  No labs warranted at this time.   Psychotherapy: Therapy: brief supportive therapy provided.  Discussed psychosocial stressors in detail. More than 50% of the visit was spent on individual therapy/counseling.   Medications:  Continue  the following psychiatric medications as written prior to this appointment with the following changes::  a)  Sertraline 100 mg-200 mg daily. B) Discontinue Clonazepam, patient has stopped taking this medication. -Risks and benefits, side effects and alternatives discussed with patient, she was given an opportunity to ask questions about his/her medication, illness, and treatment. All current psychiatric medications have been reviewed and discussed with the patient and adjusted as clinically appropriate. The patient has been provided an accurate and updated list of the medications being now prescribed.   Routine PRN Medications:  Negative  Consultations: The patient was encouraged to keep all PCP and specialty clinic appointments.   Safety Concerns:   Patient told to call clinic if any problems occur. Patient advised to go to  ER  if she should develop SI/HI, side effects, or if symptoms worsen. Has crisis numbers to call if needed.    Other:   8. Patient was instructed to return to clinic in 3 months. 9. The patient was advised to call and cancel their mental health appointment within 24 hours of the appointment, if they are unable to keep the appointment, as well as the three no show and termination from clinic policy. 10. The patient expressed understanding of the plan and agrees with the above. 11. Patient informed that April 15th, 2015 would be my last day at this clinic.   Time Spent:  25 minutes  Jacqulyn Cane, M.D.  12/22/2013 1:50 PM

## 2014-01-06 ENCOUNTER — Other Ambulatory Visit: Payer: Self-pay | Admitting: Internal Medicine

## 2014-01-06 NOTE — Telephone Encounter (Signed)
Refill request

## 2014-01-08 ENCOUNTER — Ambulatory Visit: Payer: Self-pay | Admitting: Internal Medicine

## 2014-01-19 ENCOUNTER — Ambulatory Visit: Payer: Self-pay | Admitting: Internal Medicine

## 2014-01-26 ENCOUNTER — Other Ambulatory Visit: Payer: Self-pay | Admitting: Internal Medicine

## 2014-02-04 ENCOUNTER — Encounter: Payer: Self-pay | Admitting: Internal Medicine

## 2014-02-04 ENCOUNTER — Ambulatory Visit (INDEPENDENT_AMBULATORY_CARE_PROVIDER_SITE_OTHER): Payer: Medicare Other | Admitting: Internal Medicine

## 2014-02-04 VITALS — BP 110/76 | HR 57 | Ht 62.0 in | Wt 166.6 lb

## 2014-02-04 DIAGNOSIS — J3081 Allergic rhinitis due to animal (cat) (dog) hair and dander: Secondary | ICD-10-CM | POA: Diagnosis not present

## 2014-02-04 DIAGNOSIS — H1045 Other chronic allergic conjunctivitis: Secondary | ICD-10-CM

## 2014-02-04 DIAGNOSIS — J45909 Unspecified asthma, uncomplicated: Secondary | ICD-10-CM

## 2014-02-04 DIAGNOSIS — H1013 Acute atopic conjunctivitis, bilateral: Secondary | ICD-10-CM

## 2014-02-04 NOTE — Progress Notes (Signed)
11/06/13- 48 yoF never smoker self-referred for allergy.  She has had perennial allergic rhinitis since childhood, improved during her teens but worse again since age 48. Triggers have included pollens, house dust, dry or humid air, strong odors and irritants. Previous allergy testing at [facility] [place] and again in [place] around 2000 and. She members positive reactions to tree pollens, grass pollens, ragweed. She was on allergy shots for 2 years and thinks that helped her, but she had large local reactions. She has tried to minimize use of Flonase, but says if she skips it, symptoms flare. She prefers Careers adviserAllegra as an antihistamine. Singulair caused bad dreams. Multiple food intolerances-peanuts, citrus, dairy, weak/gluten-all causing irritable bowel symptoms. She says celiac disease was ruled out. Intolerance to latex but not contrast dye or aspirin. Treated with prednisone 17 years ago for urticaria. Never stung by insect. 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 Lives in a townhouse with air filters and and encasings on bedding. CAC. 2 dogs, 2 cats, no mold. Brother had anaphylactic reaction from yellow jacket sting. Both parents have "allergies".  02/04/14- 48 yoF never smoker followed for allergic rhinitis, allergic conjunctivitis, food intolerance/ irritable bowel, complicated by DM, HBP, Hx depression FOLLOWS FOR: using OTC eye drops for allergies due to increased pollen; itchy and watery eyes., She prefers brand-name Allegra over fexofenadine generic. Using Flonase. She intends to keep her cats and dog. She thinks her eyes are doing quite well now Allergy profile 11/06/2013-total IgE 651.9, broadly positive especially for cat  ROS-see HPI Constitutional:   No-   weight loss, night sweats, fevers, chills, fatigue, lassitude. HEENT:   +  headaches, +difficulty swallowing, tooth/dental problems, sore throat,       No-  +sneezing,+ itching, no-ear ache,+nasal congestion, post nasal  drip,  CV:  No-   chest pain, orthopnea, PND, swelling in lower extremities, anasarca,                                  dizziness, palpitations Resp: +shortness of breath with exertion or at rest.              No-   productive cough,  No non-productive cough,  No- coughing up of blood.              No-   change in color of mucus.  No- wheezing.   Skin: No-   rash or lesions. GI:  +heartburn, indigestion, +abdominal pain, nausea, vomiting,  GU:  MS: + joint pain or swelling.   Neuro-     nothing unusual Psych:  No- change in mood or affect. No depression or anxiety.  No memory loss.  OBJ- Physical Exam General- Alert, Oriented, Affect-appropriate, Distress- none acute Skin- rash-none, lesions- none, excoriation- none Lymphadenopathy- none Head- atraumatic            Eyes- Gross vision intact, PERRLA, conjunctivae and secretions clear            Ears- Hearing, canals-normal            Nose- Clear, no-Septal dev, +mucus bridging, polyps, erosion, perforation             Throat- Mallampati II , mucosa clear , drainage- none, tonsils- atrophic Neck- flexible , trachea midline, no stridor , thyroid nl, carotid no bruit Chest - symmetrical excursion , unlabored           Heart/CV- RRR , no murmur ,  no Sibrian  , no rub, nl s1 s2                           - JVD- none , edema- none, stasis changes- none, varices- none           Lung- clear to P&A, wheeze- none, cough- none , dullness-none, rub- none           Chest wall-  Abd-  Br/ Gen/ Rectal- Not done, not indicated Extrem- cyanosis- none, clubbing, none, atrophy- none, strength- nl Neuro- grossly intact to observation

## 2014-02-04 NOTE — Patient Instructions (Addendum)
We can try allergy vaccine at any point if your current meds don't seem sufficient to keep you comfortable  Please call as needed

## 2014-02-23 ENCOUNTER — Encounter: Payer: Self-pay | Admitting: Internal Medicine

## 2014-02-23 ENCOUNTER — Ambulatory Visit (INDEPENDENT_AMBULATORY_CARE_PROVIDER_SITE_OTHER): Payer: Medicare Other | Admitting: Internal Medicine

## 2014-02-23 VITALS — BP 120/70 | HR 59 | Temp 98.0°F | Ht 62.0 in | Wt 160.0 lb

## 2014-02-23 DIAGNOSIS — E119 Type 2 diabetes mellitus without complications: Secondary | ICD-10-CM | POA: Diagnosis not present

## 2014-02-23 DIAGNOSIS — E139 Other specified diabetes mellitus without complications: Secondary | ICD-10-CM

## 2014-02-23 LAB — HEMOGLOBIN A1C: HEMOGLOBIN A1C: 6.8 % — AB (ref 4.6–6.5)

## 2014-02-23 NOTE — Patient Instructions (Signed)
Please stop at the lab. Please return in 3 months with your sugar log.

## 2014-02-23 NOTE — Progress Notes (Signed)
Patient ID: [name], female   DOB: 1966/05/30, 48 y.o.   MRN: [id number]  HPI: [name] is a 48 y.o.-year-old female, returning for f/u for DM2, dx 2013, non-insulin-dependent, well controlled, without complications. Last visit 4 mo ago.  She c/o sinus HA and pollen allergies.  Last hemoglobin A1c was: Lab Results  Component Value Date   HGBA1C 6.6* 10/21/2013   HGBA1C 6.9* 07/10/2013   HGBA1C 7.1* 02/26/2013   Pt is on a regimen of: - Cycloset - 1.6 mg daily  She had various intolerances to different diabetes medications:  Metformin ER >> gastric discomfort and severe diarrhea (loss of bowel control). Glipizide: low CBGs, eating more, weight gain  Januvia >> tried for 1-2 months >> left chest pain  She was wondering if she could take Invokana, however she had multiple vaginal yeast infections 1-2 years ago. She has to urinate 3-4 x a night.   At last visit, she described reactive hypoglycemia symptoms >> I advised her to stop Quercetin (was taking it for allergies), as it is known to increase insulin production by the pancreas.  Pt checks her sugars 2-3 a week and they are - per her log: - am: 150-155 >> 150-155 >> 111-130 (150 when eating desert the night before) >> 100-134 >> 116-126 - later in the day it can go up to 200-250 >> 140-180 (higher around Christmastime) >> (2-3 hours) 148-160 (220) No lows. Lowest sugar was 90s; she has hypoglycemia awareness but unknown at what level. Highest sugar was 220.  She sees an allergologist recently.  Has many food intolerances: - wheat >> cannot eat bread or pastry.  - raw vegetables, beans, cruciferous vegetables, fruit >> diarrhea.  - No dairy - She does not digest fat well after her cholecystectomy in 2011. She is on Questran, otw gets more severe diarrhea. Takes Questran at noon. She sticks to lean proteins: fish, [place], chicken, squash. She does not skip meals.   She started to go to a gym >> goes 2-3x a week >> not  with allergies. Also walks her Yorkie.  - No chronic kidney disease, last BUN/creatinine was:  Lab Results  Component Value Date   BUN 9 02/26/2013   CREATININE 0.65 02/26/2013  On Irbesartan.  - has a h/o HL - Last set of lipids shown below. She is on Questran. Lab Results  Component Value Date   CHOL 292* 02/26/2013   HDL 98 02/26/2013   LDLCALC 152* 02/26/2013   TRIG 211* 02/26/2013   CHOLHDL 3.0 02/26/2013  Previous cholesterol levels were: 325/197/132/154. She would like to avoid statins as she was told these can increase depression.  - last eye exam was in Spring 2015 (Dr Randon Goldsmith) - no DR. - no numbness and tingling in her legs.  She also has a history of PCOS - she has seen Dr. Rosine Door at [facility] in the past - note from 08/26/2012: perimenopause, and at that time she was on Yasmin, subsequently changed to Necon. She sees Dr. Marvel Plan with OB/GYN. She has PTSD, MDD, insomnia, anxiety, asthma, HTN, GERD, status post cholecystectomy, anemia, menorrhagia, improved a little on new OCP, transaminitis.  I reviewed pt's medications, allergies, PMH, social hx, family hx and no changes required, except as mentioned above.  ROS: Constitutional: no weight gain/loss, + fatigue, no subjective hyperthermia/hypothermia, + poor sleep, + nocturia Eyes: no blurry vision, no xerophthalmia ENT: no sore throat, no nodules palpated in throat, + dysphagia/no odynophagia, no hoarseness Cardiovascular: no  CP/SOB/palpitations/leg swelling Respiratory: no cough/+ SOB and wheezing Gastrointestinal: + N/no V/+ D/no C/+ heartburn Musculoskeletal: no muscle aches/no joint aches, + HA Skin: no rashes  PE: BP 120/70  Pulse 59  Temp(Src) 98 F (36.7 C) (Oral)  Ht 5\' 2"  (1.575 m)  Wt 160 lb (72.576 kg)  BMI 29.26 kg/m2  SpO2 95% Wt Readings from Last 3 Encounters:  02/23/14 160 lb (72.576 kg)  02/04/14 166 lb 9.6 oz (75.569 kg)  12/22/13 164 lb (74.39 kg)   Constitutional: overweight, truncal  obesity, in NAD Eyes: PERRLA, EOMI, no exophthalmos ENT: moist mucous membranes, no thyromegaly, no cervical lymphadenopathy Cardiovascular: RRR, 2/6 SEM, no MRG Respiratory: CTA B Gastrointestinal: abdomen soft, NT, ND, BS+ Musculoskeletal: no deformities, strength intact in all 4 Skin: moist, warm, no rashes  ASSESSMENT: 1. DM2, non-insulin-dependent, well controlled, without complications  PLAN:  1. DM2 Patient with relatively recently diagnosed DM 2, with a recent hemoglobin A1c that is at target, at 6.6%.  She has several intolerances to diabetic meds - metformin (even extended release), Januvia, glipizide. We cannot use Invokana due to h/o UTIs, and her nocturia. She continues on Cycloset 1.6 mg daily, which she tolerates well.  - since sugars are stable, and she tolerates it well, continue Cycloset 1.6 mg daily  - up to date with eye exams - will check HbA1C today - return to clinic in 3 months with her sugar log  Office Visit on 02/23/2014  Component Date Value Ref Range Status  . Hemoglobin A1C 02/23/2014 6.8* 4.6 - 6.5 % Final   Glycemic Control Guidelines for People with Diabetes:Non Diabetic:  <6%Goal of Therapy: <7%Additional Action Suggested:  >8%    HbA1c about the same!

## 2014-03-14 ENCOUNTER — Other Ambulatory Visit: Payer: Self-pay | Admitting: Internal Medicine

## 2014-03-18 ENCOUNTER — Telehealth: Payer: Self-pay | Admitting: Internal Medicine

## 2014-03-18 NOTE — Telephone Encounter (Signed)
Sleep schedule will be changing what does she do now that she will be waking up at 5 am and not 10 am like she used to. Please advise how pt is to take her insulin if her sleep pattern changes

## 2014-03-18 NOTE — Telephone Encounter (Signed)
Called pt and lvm advising her per Dr Charlean SanfilippoGherghe's note. Advised if she has any questions call our office.

## 2014-03-18 NOTE — Telephone Encounter (Signed)
Cycloset needs to be taken FIRST THING in am, when she wakes up, whether this is 10 am or 5 am.

## 2014-03-21 ENCOUNTER — Other Ambulatory Visit: Payer: Self-pay | Admitting: Internal Medicine

## 2014-03-24 ENCOUNTER — Other Ambulatory Visit: Payer: Self-pay | Admitting: Internal Medicine

## 2014-03-28 DIAGNOSIS — H1010 Acute atopic conjunctivitis, unspecified eye: Secondary | ICD-10-CM | POA: Insufficient documentation

## 2014-03-28 NOTE — Assessment & Plan Note (Signed)
Environmental controls. Watch for seasonal triggers. Okay to continue OTC eyedrops and Flonase as long as these are helpful

## 2014-03-28 NOTE — Assessment & Plan Note (Signed)
We can turn to allergy vaccine at any time if symptomatic medicines are not enough. We again discussed environmental controls and ways to minimize pet exposure

## 2014-03-28 NOTE — Assessment & Plan Note (Signed)
Well controlled with no recent symptoms

## 2014-03-30 ENCOUNTER — Ambulatory Visit (INDEPENDENT_AMBULATORY_CARE_PROVIDER_SITE_OTHER): Payer: Medicare Other | Admitting: Internal Medicine

## 2014-03-30 ENCOUNTER — Encounter: Payer: Self-pay | Admitting: Internal Medicine

## 2014-03-30 VITALS — BP 116/68 | HR 58 | Resp 16 | Ht 62.0 in | Wt 168.0 lb

## 2014-03-30 DIAGNOSIS — I10 Essential (primary) hypertension: Secondary | ICD-10-CM

## 2014-03-30 DIAGNOSIS — E785 Hyperlipidemia, unspecified: Secondary | ICD-10-CM

## 2014-03-30 DIAGNOSIS — D500 Iron deficiency anemia secondary to blood loss (chronic): Secondary | ICD-10-CM

## 2014-03-30 MED ORDER — NAPROXEN 500 MG PO TABS: 500.0000 mg | ORAL_TABLET | Freq: Two times a day (BID) | ORAL | Status: DC

## 2014-03-30 MED ORDER — METHOCARBAMOL 500 MG PO TABS: 500.0000 mg | ORAL_TABLET | Freq: Three times a day (TID) | ORAL | Status: DC | PRN
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
## 2014-04-02 DIAGNOSIS — E785 Hyperlipidemia, unspecified: Secondary | ICD-10-CM | POA: Diagnosis not present

## 2014-04-02 LAB — LIPID PANEL
CHOLESTEROL: 228 mg/dL — AB (ref 0–200)
HDL: 120 mg/dL (ref >39)
LDL Cholesterol: 85 mg/dL (ref 0–99)
Total CHOL/HDL Ratio: 1.9 Ratio
Triglycerides: 116 mg/dL (ref <150)
VLDL: 23 mg/dL (ref 0–40)

## 2014-04-02 LAB — CBC WITH DIFFERENTIAL/PLATELET
Basophils Absolute: 0 10*3/uL (ref 0.0–0.1)
Basophils Relative: 0 % (ref 0–1)
EOS ABS: 0.6 10*3/uL (ref 0.0–0.7)
Eosinophils Relative: 8 % — ABNORMAL HIGH (ref 0–5)
HCT: 32.6 % — ABNORMAL LOW (ref 36.0–46.0)
Hemoglobin: 10.5 g/dL — ABNORMAL LOW (ref 12.0–15.0)
LYMPHS ABS: 1.6 10*3/uL (ref 0.7–4.0)
Lymphocytes Relative: 21 % (ref 12–46)
MCH: 27.9 pg (ref 26.0–34.0)
MCHC: 32.2 g/dL (ref 30.0–36.0)
MCV: 86.7 fL (ref 78.0–100.0)
Monocytes Absolute: 0.3 10*3/uL (ref 0.1–1.0)
Monocytes Relative: 4 % (ref 3–12)
NEUTROS ABS: 5.2 10*3/uL (ref 1.7–7.7)
NEUTROS PCT: 67 % (ref 43–77)
PLATELETS: 276 10*3/uL (ref 150–400)
RBC: 3.76 MIL/uL — ABNORMAL LOW (ref 3.87–5.11)
RDW: 14.8 % (ref 11.5–15.5)
WBC: 7.7 10*3/uL (ref 4.0–10.5)

## 2014-04-02 LAB — COMPREHENSIVE METABOLIC PANEL
ALT: 22 U/L (ref 0–35)
AST: 28 U/L (ref 0–37)
Albumin: 3.2 g/dL — ABNORMAL LOW (ref 3.5–5.2)
Alkaline Phosphatase: 73 U/L (ref 39–117)
BILIRUBIN TOTAL: 0.3 mg/dL (ref 0.2–1.2)
BUN: 11 mg/dL (ref 6–23)
CO2: 23 meq/L (ref 19–32)
Calcium: 9.3 mg/dL (ref 8.4–10.5)
Chloride: 94 mEq/L — ABNORMAL LOW (ref 96–112)
Creat: 0.57 mg/dL (ref 0.50–1.10)
Glucose, Bld: 108 mg/dL — ABNORMAL HIGH (ref 70–99)
POTASSIUM: 4.9 meq/L (ref 3.5–5.3)
SODIUM: 134 meq/L — AB (ref 135–145)
TOTAL PROTEIN: 7.2 g/dL (ref 6.0–8.3)

## 2014-04-12 DIAGNOSIS — E282 Polycystic ovarian syndrome: Secondary | ICD-10-CM | POA: Diagnosis not present

## 2014-04-12 DIAGNOSIS — N949 Unspecified condition associated with female genital organs and menstrual cycle: Secondary | ICD-10-CM | POA: Diagnosis not present

## 2014-04-12 DIAGNOSIS — N938 Other specified abnormal uterine and vaginal bleeding: Secondary | ICD-10-CM | POA: Diagnosis not present

## 2014-04-13 ENCOUNTER — Ambulatory Visit (INDEPENDENT_AMBULATORY_CARE_PROVIDER_SITE_OTHER): Payer: Medicare Other | Admitting: Psychiatry

## 2014-04-13 DIAGNOSIS — F332 Major depressive disorder, recurrent severe without psychotic features: Secondary | ICD-10-CM

## 2014-04-13 DIAGNOSIS — F4310 Post-traumatic stress disorder, unspecified: Secondary | ICD-10-CM | POA: Diagnosis not present

## 2014-04-13 MED ORDER — SERTRALINE HCL 100 MG PO TABS: 200.0000 mg | ORAL_TABLET | Freq: Every day | ORAL | Status: DC
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
## 2014-04-14 ENCOUNTER — Telehealth: Payer: Self-pay | Admitting: Internal Medicine

## 2014-04-14 DIAGNOSIS — D649 Anemia, unspecified: Secondary | ICD-10-CM | POA: Insufficient documentation

## 2014-04-14 NOTE — Telephone Encounter (Signed)
Spoke with pt and infoemd of lab results   She has had heavy menses - saw her GYN earlier this week  She is now on progesterone and FE supplements

## 2014-04-29 ENCOUNTER — Other Ambulatory Visit: Payer: Self-pay | Admitting: Internal Medicine

## 2014-04-29 NOTE — Telephone Encounter (Signed)
Requested Medications     Medication name:  Name from pharmacy:  lansoprazole (PREVACID) 30 MG capsule  LANSOPRAZOLE DR 30 MG CAPSULE    Sig: TAKE 1 CAPSULE (30 MG TOTAL) BY MOUTH DAILY.    Dispense: 30 capsule Refills: 5 Start: 04/29/2014  Class: Normal    Requested on: 11/09/2013    Originally ordered on: 01/01/2013 Last refill: 04/01/2014 Order History and Details

## 2014-05-18 ENCOUNTER — Ambulatory Visit (HOSPITAL_COMMUNITY): Payer: Self-pay | Admitting: Psychiatry

## 2014-05-23 ENCOUNTER — Other Ambulatory Visit: Payer: Self-pay | Admitting: Internal Medicine

## 2014-05-26 ENCOUNTER — Ambulatory Visit: Payer: Self-pay | Admitting: Internal Medicine

## 2014-06-08 ENCOUNTER — Ambulatory Visit: Payer: Self-pay | Admitting: Internal Medicine

## 2014-06-16 ENCOUNTER — Ambulatory Visit: Payer: Self-pay | Admitting: Internal Medicine

## 2014-07-02 ENCOUNTER — Other Ambulatory Visit (INDEPENDENT_AMBULATORY_CARE_PROVIDER_SITE_OTHER): Payer: Medicare Other | Admitting: *Deleted

## 2014-07-02 ENCOUNTER — Encounter: Payer: Self-pay | Admitting: Internal Medicine

## 2014-07-02 ENCOUNTER — Other Ambulatory Visit: Payer: Self-pay | Admitting: *Deleted

## 2014-07-02 ENCOUNTER — Ambulatory Visit (INDEPENDENT_AMBULATORY_CARE_PROVIDER_SITE_OTHER): Payer: Medicare Other | Admitting: Internal Medicine

## 2014-07-02 VITALS — BP 122/74 | HR 67 | Temp 98.1°F | Resp 12 | Wt 162.0 lb

## 2014-07-02 DIAGNOSIS — Z23 Encounter for immunization: Secondary | ICD-10-CM

## 2014-07-02 DIAGNOSIS — E119 Type 2 diabetes mellitus without complications: Secondary | ICD-10-CM | POA: Diagnosis not present

## 2014-07-02 LAB — HEMOGLOBIN A1C: Hgb A1c MFr Bld: 7.1 % — ABNORMAL HIGH (ref 4.6–6.5)

## 2014-07-02 MED ORDER — METHOCARBAMOL 500 MG PO TABS: 500.0000 mg | ORAL_TABLET | Freq: Three times a day (TID) | ORAL | Status: DC | PRN

## 2014-07-02 NOTE — Patient Instructions (Signed)
Please continue Cycloset 1.6 mg daily first thing in am. Please come back for a follow-up appointment in 4 months. Keep up the great work!

## 2014-07-02 NOTE — Telephone Encounter (Signed)
Refill request

## 2014-07-02 NOTE — Progress Notes (Signed)
Patient ID: [name], female   DOB: [birth year], 48 y.o.   MRN: [id number]  HPI: Abigail CoveSara E Restivo is a 48 y.o.-year-old female, returning for f/u for DM2, dx 2013, non-insulin-dependent, well controlled, without complications. Last visit 4 mo ago.  She feels great, lost 6 lbs. She has a new b'friend.  Last hemoglobin A1c was: Lab Results  Component Value Date   HGBA1C 6.8* 02/23/2014   HGBA1C 6.6* 10/21/2013   HGBA1C 6.9* 07/10/2013   Pt is on a regimen of: - Cycloset - 1.6 mg daily  She had various intolerances to different diabetes medications:  Metformin ER >> gastric discomfort and severe diarrhea (loss of bowel control). Glipizide: low CBGs, eating more, weight gain  Januvia >> tried for 1-2 months >> left chest pain  She was wondering if she could take Invokana, however she had multiple vaginal yeast infections 1-2 years ago. She has to urinate 3-4 x a night.   At last visit, she described reactive hypoglycemia symptoms >> I advised her to stop Quercetin (was taking it for allergies), as it is known to increase insulin production by the pancreas.  Pt checks her sugars 2-3x a week and they are - no log: - am: 150-155 >> 150-155 >> 111-130 (150 when eating desert the night before) >> 100-134 >> 116-126 >> 110-130 - later in the day it can go up to 200-250 >> 140-180 (higher around Christmastime) >> (2-3 hours) 148-160 (220) >> same No lows. Lowest sugar was 100s; she has hypoglycemia awareness but unknown at what level. Highest sugar was 200s.  Has many food intolerances: - wheat >> cannot eat bread or pastry.  - raw vegetables, beans, cruciferous vegetables, fruit >> diarrhea.  - No dairy - She does not digest fat well after her cholecystectomy in 2011. She is on Questran, otw gets more severe diarrhea. Takes Questran at noon. She sticks to lean proteins: fish, Malawiturkey, chicken, squash. She does not skip meals.   She started to go to a gym >> goes 2-3x a week >> not with  allergies. Also walks her Yorkie.  - No chronic kidney disease, last BUN/creatinine was:  Lab Results  Component Value Date   BUN 11 04/02/2014   CREATININE 0.57 04/02/2014  On Irbesartan.  - has a h/o HL - Last set of lipids greatly improved. She is on Questran. Lab Results  Component Value Date   CHOL 228* 04/02/2014   HDL 120 04/02/2014   LDLCALC 85 04/02/2014   TRIG 116 04/02/2014   CHOLHDL 1.9 04/02/2014  Previous cholesterol levels were: 325/197/132/154. She would like to avoid statins as she was told these can increase depression.  - last eye exam was in Spring 2015 (Dr Randon GoldsmithLyles) - no DR. - no numbness and tingling in her legs.  She also has a history of PCOS - she [facility] - note from 08/26/2012: perimenopause, and at that time she was on Yasmin, subsequently changed to Necon. She sees Dr. Marvel PlanMichelle Grewall with OB/GYN. She has PTSD, MDD, insomnia, anxiety, asthma, HTN, GERD, status post cholecystectomy, anemia, menorrhagia, improved a little on new OCP, transaminitis.  I reviewed pt's medications, allergies, PMH, social hx, family hx and no changes required, except as mentioned above.  ROS: Constitutional: no weight gain/loss, no fatigue, no subjective hyperthermia/hypothermia Eyes: no blurry vision, no xerophthalmia ENT: no sore throat, no nodules palpated in throat, no dysphagia/no odynophagia, no hoarseness Cardiovascular: no CP/SOB/palpitations/leg swelling Respiratory: no  cough/SOB and wheezing Gastrointestinal: + N/no V/D/C/heartburn Musculoskeletal: no muscle aches/no joint aches Skin: no rashes  PE: BP 122/74  Pulse 67  Temp(Src) 98.1 F (36.7 C) (Oral)  Resp 12  Wt 162 lb (73.483 kg)  SpO2 97% Body mass index is 29.62 kg/(m^2).  Wt Readings from Last 3 Encounters:  07/02/14 162 lb (73.483 kg)  03/30/14 168 lb (76.204 kg)  02/23/14 160 lb (72.576 kg)   Constitutional: overweight, truncal obesity, in NAD Eyes: PERRLA, EOMI, no  exophthalmos ENT: moist mucous membranes, no thyromegaly, no cervical lymphadenopathy Cardiovascular: RRR, 2/6 SEM, no MRG Respiratory: CTA B Gastrointestinal: abdomen soft, NT, ND, BS+ Musculoskeletal: no deformities, strength intact in all 4 Skin: moist, warm, no rashes  ASSESSMENT: 1. DM2, non-insulin-dependent, well controlled, without complications  PLAN:  1. DM2 Patient with relatively recently diagnosed DM 2, with hemoglobin A1c levels at target, at 6.8%.  She has several intolerances to diabetic meds - metformin (even extended release), Januvia, glipizide. We cannot use Invokana due to h/o UTIs, and her nocturia. She continues on Cycloset 1.6 mg daily, which she tolerates well.  - since sugars are stable, and she tolerates it well, continue Cycloset 1.6 mg daily  - up to date with eye exams - will check HbA1C today - will give her the flu vaccine today - return to clinic in 4 months with her sugar log  Office Visit on 07/02/2014  Component Date Value Ref Range Status  . Hemoglobin A1C 07/02/2014 7.1* 4.6 - 6.5 % Final   Glycemic Control Guidelines for People with Diabetes:Non Diabetic:  <6%Goal of Therapy: <7%Additional Action Suggested:  >8%   HbA1c surprisingly worse. Advised the pt to try to increase the Cycloset to 2.4 mg daily.

## 2014-07-03 ENCOUNTER — Encounter: Payer: Self-pay | Admitting: Internal Medicine

## 2014-07-06 ENCOUNTER — Other Ambulatory Visit: Payer: Self-pay | Admitting: Internal Medicine

## 2014-07-06 DIAGNOSIS — E119 Type 2 diabetes mellitus without complications: Secondary | ICD-10-CM

## 2014-07-06 MED ORDER — BROMOCRIPTINE MESYLATE 0.8 MG PO TABS: ORAL_TABLET | ORAL | Status: DC

## 2014-07-11 ENCOUNTER — Other Ambulatory Visit: Payer: Self-pay | Admitting: Internal Medicine

## 2014-07-12 ENCOUNTER — Other Ambulatory Visit: Payer: Self-pay | Admitting: Internal Medicine

## 2014-07-12 NOTE — Telephone Encounter (Signed)
Refill request

## 2014-07-16 ENCOUNTER — Other Ambulatory Visit: Payer: Self-pay | Admitting: Internal Medicine

## 2014-07-16 ENCOUNTER — Ambulatory Visit: Payer: Self-pay | Admitting: Internal Medicine

## 2014-07-24 ENCOUNTER — Other Ambulatory Visit (HOSPITAL_COMMUNITY): Payer: Self-pay | Admitting: Psychiatry

## 2014-07-27 ENCOUNTER — Ambulatory Visit: Payer: Self-pay | Admitting: Internal Medicine

## 2014-07-30 ENCOUNTER — Other Ambulatory Visit (HOSPITAL_COMMUNITY): Payer: Self-pay | Admitting: *Deleted

## 2014-07-30 DIAGNOSIS — F4310 Post-traumatic stress disorder, unspecified: Secondary | ICD-10-CM

## 2014-07-30 MED ORDER — SERTRALINE HCL 100 MG PO TABS: 200.0000 mg | ORAL_TABLET | Freq: Every day | ORAL | Status: DC

## 2014-07-30 NOTE — Telephone Encounter (Signed)
Per Dr. Gilmore LarocheAkhtar, called 1 refill for Zoloft 100mg , Qty 60 [facility]. Pt has a follow up appt on 12/1 with Dr. Gilmore LarocheAkhtar. Called and informed pt prescription is for 1 month supply and will be ready for pick up on 11/13. Pt states and shows understanding.

## 2014-08-03 ENCOUNTER — Other Ambulatory Visit: Payer: Self-pay

## 2014-08-03 NOTE — Telephone Encounter (Signed)
Refill request

## 2014-08-03 NOTE — Telephone Encounter (Signed)
Inez PilgrimSara Thobe [phone] [facility] and said she would like for her atenolol (TENORMIN) 50 MG tablet to be refilled in 90 day supply

## 2014-08-04 ENCOUNTER — Other Ambulatory Visit: Payer: Self-pay | Admitting: Internal Medicine

## 2014-08-04 MED ORDER — ATENOLOL 50 MG PO TABS: ORAL_TABLET | ORAL | Status: DC

## 2014-08-17 ENCOUNTER — Encounter (HOSPITAL_COMMUNITY): Payer: Self-pay | Admitting: Psychiatry

## 2014-08-17 ENCOUNTER — Ambulatory Visit (INDEPENDENT_AMBULATORY_CARE_PROVIDER_SITE_OTHER): Payer: Medicare Other | Admitting: Psychiatry

## 2014-08-17 VITALS — BP 116/83 | HR 68 | Ht 62.0 in | Wt 164.0 lb

## 2014-08-17 DIAGNOSIS — F3341 Major depressive disorder, recurrent, in partial remission: Secondary | ICD-10-CM

## 2014-08-17 DIAGNOSIS — F332 Major depressive disorder, recurrent severe without psychotic features: Secondary | ICD-10-CM | POA: Diagnosis not present

## 2014-08-17 DIAGNOSIS — F4310 Post-traumatic stress disorder, unspecified: Secondary | ICD-10-CM

## 2014-08-17 MED ORDER — SERTRALINE HCL 100 MG PO TABS: 200.0000 mg | ORAL_TABLET | Freq: Every day | ORAL | Status: DC
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
## 2014-09-07 ENCOUNTER — Encounter (HOSPITAL_COMMUNITY): Payer: Self-pay | Admitting: Licensed Clinical Social Worker

## 2014-09-07 ENCOUNTER — Ambulatory Visit (INDEPENDENT_AMBULATORY_CARE_PROVIDER_SITE_OTHER): Payer: Medicare Other | Admitting: Licensed Clinical Social Worker

## 2014-09-07 DIAGNOSIS — F4310 Post-traumatic stress disorder, unspecified: Secondary | ICD-10-CM | POA: Diagnosis not present

## 2014-09-07 DIAGNOSIS — F3341 Major depressive disorder, recurrent, in partial remission: Secondary | ICD-10-CM
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
## 2014-10-01 ENCOUNTER — Ambulatory Visit (INDEPENDENT_AMBULATORY_CARE_PROVIDER_SITE_OTHER): Payer: Medicare Other | Admitting: Licensed Clinical Social Worker

## 2014-10-01 DIAGNOSIS — F4310 Post-traumatic stress disorder, unspecified: Secondary | ICD-10-CM | POA: Diagnosis not present
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
## 2014-10-13 ENCOUNTER — Other Ambulatory Visit: Payer: Self-pay | Admitting: Internal Medicine

## 2014-10-20 ENCOUNTER — Ambulatory Visit (HOSPITAL_COMMUNITY): Payer: Self-pay | Admitting: Licensed Clinical Social Worker

## 2014-10-29 DIAGNOSIS — Z01419 Encounter for gynecological examination (general) (routine) without abnormal findings: Secondary | ICD-10-CM | POA: Diagnosis not present

## 2014-10-29 DIAGNOSIS — B373 Candidiasis of vulva and vagina: Secondary | ICD-10-CM | POA: Diagnosis not present

## 2014-10-29 DIAGNOSIS — Z113 Encounter for screening for infections with a predominantly sexual mode of transmission: Secondary | ICD-10-CM | POA: Diagnosis not present

## 2014-10-29 DIAGNOSIS — Z779 Other contact with and (suspected) exposures hazardous to health: Secondary | ICD-10-CM | POA: Diagnosis not present

## 2014-10-29 DIAGNOSIS — N938 Other specified abnormal uterine and vaginal bleeding: Secondary | ICD-10-CM | POA: Diagnosis not present

## 2014-10-29 DIAGNOSIS — N942 Vaginismus: Secondary | ICD-10-CM | POA: Diagnosis not present

## 2014-10-29 DIAGNOSIS — Z114 Encounter for screening for human immunodeficiency virus [HIV]: Secondary | ICD-10-CM | POA: Diagnosis not present

## 2014-10-29 DIAGNOSIS — Z1159 Encounter for screening for other viral diseases: Secondary | ICD-10-CM | POA: Diagnosis not present

## 2014-10-29 DIAGNOSIS — R232 Flushing: Secondary | ICD-10-CM | POA: Diagnosis not present

## 2014-10-31 ENCOUNTER — Other Ambulatory Visit: Payer: Self-pay | Admitting: Internal Medicine

## 2014-11-03 ENCOUNTER — Ambulatory Visit (INDEPENDENT_AMBULATORY_CARE_PROVIDER_SITE_OTHER): Payer: Medicare Other | Admitting: Internal Medicine

## 2014-11-03 ENCOUNTER — Other Ambulatory Visit: Payer: Self-pay | Admitting: Internal Medicine

## 2014-11-03 ENCOUNTER — Encounter: Payer: Self-pay | Admitting: Internal Medicine

## 2014-11-03 VITALS — BP 112/64 | HR 72 | Temp 97.6°F | Resp 12 | Wt 170.0 lb

## 2014-11-03 DIAGNOSIS — E119 Type 2 diabetes mellitus without complications: Secondary | ICD-10-CM

## 2014-11-03 LAB — HEMOGLOBIN A1C: Hgb A1c MFr Bld: 7.1 % — ABNORMAL HIGH (ref 4.6–6.5)

## 2014-11-03 MED ORDER — GLIPIZIDE 5 MG PO TABS: 5.0000 mg | ORAL_TABLET | Freq: Every day | ORAL | Status: DC

## 2014-11-03 NOTE — Progress Notes (Signed)
Patient ID: [name], female   DOB: 03-15-1966, 49 y.o.   MRN: [id number]  HPI: [name] is a 49 y.o.-year-old female, returning for f/u for DM2, dx 2013, non-insulin-dependent, well controlled, without complications. Last visit 4 mo ago.  Last hemoglobin A1c was: Lab Results  Component Value Date   HGBA1C 7.1* 07/02/2014   HGBA1C 6.8* 02/23/2014   HGBA1C 6.6* 10/21/2013   Pt is on a regimen of: - Cycloset - 1.6 mg >> 2.4 mg daily (increased in 06/2014)  She had various intolerances to different diabetes medications:  Metformin ER >> gastric discomfort and severe diarrhea (loss of bowel control). Glipizide: low CBGs, eating more, weight gain  Januvia >> tried for 1-2 months >> left chest pain  She was wondering if she could take Invokana, however she had multiple vaginal yeast infections 1-2 years ago. She has to urinate 3-4 x a night.   At last visit, she described reactive hypoglycemia symptoms >> I advised her to stop Quercetin (was taking it for allergies), as it is known to increase insulin production by the pancreas.  Pt checks her sugars 2-3x a week and they are much higher - no log: - am: 111-130 (150 when eating desert the night before) >> 100-134 >> 116-126 >> 110-130 >> 136-140, 240  - later in the day:  it can go up to 200-250 >> 140-180 >> 148-160 (220) >> same >> bedtime: 180-280, but few checks No lows. Lowest sugar was 130s; she has hypoglycemia awareness but unknown at what level. Highest sugar was 280s.  Has many food intolerances: - wheat >> cannot eat bread or pastry.  - raw vegetables, beans, cruciferous vegetables, fruit >> diarrhea.  - No dairy - She does not digest fat well after her cholecystectomy in 2011.   - No chronic kidney disease, last BUN/creatinine was:  Lab Results  Component Value Date   BUN 11 04/02/2014   CREATININE 0.57 04/02/2014  On Irbesartan.  - has a h/o HL - Last set of lipids greatly improved. She is on Questran - not  every day, now - takes it only once a mo, for diarrhea. Lab Results  Component Value Date   CHOL 228* 04/02/2014   HDL 120 04/02/2014   LDLCALC 85 04/02/2014   TRIG 116 04/02/2014   CHOLHDL 1.9 04/02/2014  Previous cholesterol levels were: 325/197/132/154. She would like to avoid statins as she was told these can increase depression.  - last eye exam was in Spring 2015 (Dr Randon Goldsmith) - no DR. - no numbness and tingling in her legs.  She also has a history of PCOS - she has seen Dr. Rosine [facility] at [facility] in the past - note from 08/26/2012: perimenopause, and at that time she was on Yasmin, subsequently changed to Necon. She sees Dr. Marvel Plan with OB/GYN. She has PTSD, MDD, insomnia, anxiety, asthma, HTN, GERD, status post cholecystectomy, anemia, menorrhagia, improved a little on new OCP, transaminitis.  I reviewed pt's medications, allergies, PMH, social hx, family hx, and changes were documented in the history of present illness. Otherwise, unchanged from my initial visit note. She started to see an acupuncturist >> feeling much better and sleeping better, too.  ROS: Constitutional: no weight gain/loss, +  fatigue, no subjective hyperthermia/hypothermia, + improved sleep, + nocturia Eyes: no blurry vision, no xerophthalmia ENT: no sore throat, no nodules palpated in throat, + dysphagia/no odynophagia, + hoarseness Cardiovascular: no CP/+ SOB/no palpitations/leg swelling Respiratory: no cough/+ SOB and wheezing Gastrointestinal: +  N/no V/+ D/no C/+ heartburn Musculoskeletal: no muscle aches/no joint aches Skin: no rashes  PE: BP 112/64 mmHg  Pulse 72  Temp(Src) 97.6 F (36.4 C) (Oral)  Resp 12  SpO2 98% There is no weight on file to calculate BMI.  Wt Readings from Last 3 Encounters:  08/17/14 164 lb (74.39 kg)  07/02/14 162 lb (73.483 kg)  03/30/14 168 lb (76.204 kg)   Constitutional: overweight, truncal obesity, in NAD Eyes: PERRLA, EOMI, no exophthalmos ENT: moist mucous  membranes, no thyromegaly, no cervical lymphadenopathy Cardiovascular: RRR, 2/6 SEM, no MRG Respiratory: CTA B, except scattered wheezes in upper lung fields bilat. Gastrointestinal: abdomen soft, NT, ND, BS+ Musculoskeletal: no deformities, strength intact in all 4 Skin: moist, warm, no rashes  ASSESSMENT: 1. DM2, non-insulin-dependent, well controlled, without complications  PLAN:  1. DM2 Patient with controlled DM2, with deteriorating control lately 2/2 overeating (stress: going through a breakup)  She has several intolerances to diabetic meds - metformin (even extended release), Januvia, glipizide. We cannot use Invokana due to h/o UTIs, and her nocturia. She continues on Cycloset 2.4 mg daily, which she tolerates well, but I advised her to add Glipizide with a large meal. She definitely needs to get back on a controlled diet, as she was doing before.  Patient Instructions  Please continue Cycloset 2.4 mg daily in am. Start Glipizide 5 mg before a large meal.  Please resume the previous diet.  Please return in 3 months with your sugar log.   Please stop at the lab.  - up to date with eye exams - will check HbA1C today - given her the flu vaccine this season - return to clinic in 3 months with her sugar log  Office Visit on 11/03/2014  Component Date Value Ref Range Status  . Hgb A1c MFr Bld 11/03/2014 7.1* 4.6 - 6.5 % Final   Glycemic Control Guidelines for People with Diabetes:Non Diabetic:  <6%Goal of Therapy: <7%Additional Action Suggested:  >8%    Surprisingly, HbA1c is the same as before!

## 2014-11-03 NOTE — Patient Instructions (Signed)
Please continue Cycloset 2.4 mg daily in am. Start Glipizide 5 mg before a large meal.  Please resume the previous diet.  Please return in 3 months with your sugar log.   Please stop at the lab.

## 2014-11-05 ENCOUNTER — Ambulatory Visit (HOSPITAL_COMMUNITY): Payer: Self-pay | Admitting: Licensed Clinical Social Worker

## 2014-11-09 ENCOUNTER — Other Ambulatory Visit (HOSPITAL_COMMUNITY): Payer: Self-pay | Admitting: Psychiatry

## 2014-11-22 DIAGNOSIS — H5702 Anisocoria: Secondary | ICD-10-CM | POA: Diagnosis not present

## 2014-12-03 ENCOUNTER — Ambulatory Visit (HOSPITAL_COMMUNITY): Payer: Self-pay | Admitting: Licensed Clinical Social Worker

## 2014-12-05 ENCOUNTER — Other Ambulatory Visit: Payer: Self-pay | Admitting: Internal Medicine

## 2014-12-06 NOTE — Telephone Encounter (Signed)
refillv request

## 2014-12-12 ENCOUNTER — Other Ambulatory Visit: Payer: Self-pay | Admitting: Internal Medicine

## 2014-12-13 NOTE — Telephone Encounter (Signed)
Refill request

## 2014-12-17 ENCOUNTER — Ambulatory Visit (HOSPITAL_COMMUNITY): Payer: Self-pay | Admitting: Psychiatry

## 2014-12-24 ENCOUNTER — Ambulatory Visit (HOSPITAL_COMMUNITY): Payer: Self-pay | Admitting: Licensed Clinical Social Worker

## 2014-12-27 DIAGNOSIS — H5702 Anisocoria: Secondary | ICD-10-CM | POA: Diagnosis not present

## 2014-12-30 ENCOUNTER — Other Ambulatory Visit: Payer: Self-pay | Admitting: Internal Medicine

## 2014-12-31 ENCOUNTER — Ambulatory Visit (INDEPENDENT_AMBULATORY_CARE_PROVIDER_SITE_OTHER): Payer: Medicare Other | Admitting: Psychiatry

## 2014-12-31 ENCOUNTER — Encounter (HOSPITAL_COMMUNITY): Payer: Self-pay | Admitting: Psychiatry

## 2014-12-31 VITALS — BP 126/64 | HR 70 | Ht 62.0 in | Wt 170.0 lb

## 2014-12-31 DIAGNOSIS — F332 Major depressive disorder, recurrent severe without psychotic features: Secondary | ICD-10-CM | POA: Diagnosis not present

## 2014-12-31 DIAGNOSIS — F4310 Post-traumatic stress disorder, unspecified: Secondary | ICD-10-CM | POA: Diagnosis not present
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
## 2015-01-07 ENCOUNTER — Other Ambulatory Visit (HOSPITAL_COMMUNITY): Payer: Self-pay | Admitting: Psychiatry

## 2015-01-18 ENCOUNTER — Other Ambulatory Visit (HOSPITAL_COMMUNITY): Payer: Self-pay | Admitting: Psychiatry

## 2015-01-24 NOTE — Telephone Encounter (Signed)
Received fax from pharmacy for a refill for Zoloft 100mg . Per Dr. Gilmore LarocheAkhtar, pt is authorized for a refill for Zoloft 100mg , qty 180. Prescription was sent to pharmacy. Pt has a f/u appt on 04/29/15. Called and informed pt of prescription status. Pt states and shows understanding.

## 2015-01-26 ENCOUNTER — Other Ambulatory Visit: Payer: Self-pay | Admitting: *Deleted

## 2015-01-26 MED ORDER — ATENOLOL 50 MG PO TABS: ORAL_TABLET | ORAL | Status: DC

## 2015-01-26 NOTE — Telephone Encounter (Signed)
Refill request

## 2015-02-01 ENCOUNTER — Ambulatory Visit: Payer: Self-pay | Admitting: Internal Medicine

## 2015-02-16 ENCOUNTER — Ambulatory Visit (INDEPENDENT_AMBULATORY_CARE_PROVIDER_SITE_OTHER): Payer: Medicare Other | Admitting: Internal Medicine

## 2015-02-16 ENCOUNTER — Other Ambulatory Visit: Payer: Self-pay | Admitting: *Deleted

## 2015-02-16 ENCOUNTER — Encounter: Payer: Self-pay | Admitting: Internal Medicine

## 2015-02-16 VITALS — BP 122/68 | HR 63 | Temp 98.5°F | Resp 12 | Wt 172.6 lb

## 2015-02-16 DIAGNOSIS — E119 Type 2 diabetes mellitus without complications: Secondary | ICD-10-CM | POA: Diagnosis not present

## 2015-02-16 LAB — HEMOGLOBIN A1C: Hgb A1c MFr Bld: 6.7 % — ABNORMAL HIGH (ref 4.6–6.5)

## 2015-02-16 MED ORDER — GLIPIZIDE 5 MG PO TABS
ORAL_TABLET | ORAL | Status: DC
Start: 2015-02-16 — End: 2015-05-09

## 2015-02-16 NOTE — Patient Instructions (Signed)
Please continue Cycloset 2.4 mg daily in am.  Please change the Glipizide 5 mg 2x a day, before breakfast or lunch and dinner.   Please stop at the lab.   Please come back for a follow-up appointment in 3 months

## 2015-02-16 NOTE — Progress Notes (Signed)
Patient ID: [name], female   DOB: Feb 02, 1966, 49 y.o.   MRN: [id number]  HPI: [name] is a 49 y.o.-year-old female, returning for f/u for DM2, dx 2013, non-insulin-dependent, well controlled, without complications. Last visit 3 mo ago.  Last hemoglobin A1c was: Lab Results  Component Value Date   HGBA1C 7.1* 11/03/2014   HGBA1C 7.1* 07/02/2014   HGBA1C 6.8* 02/23/2014   Pt is on a regimen of: - Cycloset - 1.6 mg >> 2.4 mg daily (increased in 06/2014) - Glipizide 5 mg before a large meal (takes it every other day)  She had various intolerances to different diabetes medications:  Metformin ER >> gastric discomfort and severe diarrhea (loss of bowel control). Glipizide: low CBGs, eating more, weight gain  Januvia >> tried for 1-2 months >> left chest pain  She was wondering if she could take Invokana, however she had multiple vaginal yeast infections 1-2 years ago. She has to urinate 3-4 x a night.   At last visit, she described reactive hypoglycemia symptoms >> I advised her to stop Quercetin (was taking it for allergies), as it is known to increase insulin production by the pancreas.  Pt checks her sugars 1-3x a day and they are (per meter download): - am: 111-130 (150 when eating desert the night before) >> 100-134 >> 116-126 >> 110-130 >> 136-140, 240 >> 112-157, 173 - later in the day:  it can go up to 200-250 >> 140-180 >> 148-160 (220) >> same >> bedtime: 180-280, but few checks >> 94-208, 277 at night No lows. Lowest sugar was 68; she has hypoglycemia awareness but unknown at what level. Highest sugar was 277.  Has many food intolerances: - wheat >> cannot eat bread or pastry.  - raw vegetables, beans, cruciferous vegetables, fruit >> diarrhea.  - No dairy - She does not digest fat well after her cholecystectomy in 2011.   - No chronic kidney disease, last BUN/creatinine was:  Lab Results  Component Value Date   BUN 11 04/02/2014   CREATININE 0.57 04/02/2014   On Irbesartan.  - has a h/o HL - Last set of lipids greatly improved. She is on Questran - not every day, now - takes it only once a mo, for diarrhea. Lab Results  Component Value Date   CHOL 228* 04/02/2014   HDL 120 04/02/2014   LDLCALC 85 04/02/2014   TRIG 116 04/02/2014   CHOLHDL 1.9 04/02/2014  Previous cholesterol levels were: 325/197/132/154. She would like to avoid statins as she was told these can increase depression.  - last dilated eye exam was in Spring 2015 (Dr Randon Goldsmith) - no DR. - no numbness and tingling in her legs.  She also has a history of PCOS - she has seen Dr. Rosine [facility] at [facility] in the past - note from 08/26/2012: perimenopause, and at that time she was on Yasmin, subsequently changed to Necon. She sees Dr. Marvel Plan with OB/GYN. She has PTSD, MDD, insomnia, anxiety, asthma, HTN, GERD, status post cholecystectomy, anemia, menorrhagia, improved a little on new OCP, transaminitis.  She continues to see an acupuncturist >> feeling much better and sleeping better, too.  I reviewed pt's medications, allergies, PMH, social hx, family hx, and changes were documented in the history of present illness. Otherwise, unchanged from my initial visit note.   ROS: Constitutional: no weight gain/loss, +  fatigue, no subjective hyperthermia/hypothermia, + nocturia Eyes: no blurry vision, no xerophthalmia ENT: no sore throat, no nodules palpated in throat,  no dysphagia/no odynophagia, + hoarseness Cardiovascular: no CP/+ SOB/no palpitations/leg swelling Respiratory: no cough/+ SOB and wheezing Gastrointestinal: + N/no V/+ D/no C/+ heartburn Musculoskeletal: no muscle aches/no joint aches Skin: no rashes  PE: BP 122/68 mmHg  Pulse 63  Temp(Src) 98.5 F (36.9 C) (Oral)  Resp 12  Wt 172 lb 9.6 oz (78.291 kg)  SpO2 96% Body mass index is 31.56 kg/(m^2).  Wt Readings from Last 3 Encounters:  02/16/15 172 lb 9.6 oz (78.291 kg)  12/31/14 170 lb (77.111 kg)  11/03/14 170  lb (77.111 kg)   Constitutional: overweight, truncal obesity, in NAD Eyes: PERRLA, EOMI, no exophthalmos ENT: moist mucous membranes, no thyromegaly, no cervical lymphadenopathy Cardiovascular: RRR, 2/6 SEM, no MRG Respiratory: CTA B, except scattered wheezes in upper lung fields bilat. Gastrointestinal: abdomen soft, NT, ND, BS+ Musculoskeletal: no deformities, strength intact in all 4 Skin: moist, warm, no rashes  ASSESSMENT: 1. DM2, non-insulin-dependent, fairly well controlled, without complications  PLAN:  1. DM2 Patient with controlled DM2, with deteriorating control at last visit 2/2 overeating. Now with improving control. She has several intolerances to diabetic meds - metformin (even extended release), Januvia, glipizide. We cannot use Invokana due to h/o UTIs, and her nocturia. She continues on Cycloset 2.4 mg daily and Glipizide, which she takes every other day - which she tolerates well. I advised her to increase Glipizide to 5 mg bid:  Patient Instructions  Please continue Cycloset 2.4 mg daily in am.  Please change the Glipizide 5 mg 2x a day, before breakfast or lunch and dinner.   Please stop at the lab.   Please come back for a follow-up appointment in 3 months  - up to date with eye exams - will check HbA1C today - return to clinic in 3 months with her sugar log  Office Visit on 02/16/2015  Component Date Value Ref Range Status  . Hgb A1c MFr Bld 02/16/2015 6.7* 4.6 - 6.5 % Final   Glycemic Control Guidelines for People with Diabetes:Non Diabetic:  <6%Goal of Therapy: <7%Additional Action Suggested:  >8%   HbA1c better!

## 2015-02-17 ENCOUNTER — Other Ambulatory Visit: Payer: Self-pay | Admitting: *Deleted

## 2015-02-17 MED ORDER — GLUCOSE BLOOD VI STRP: ORAL_STRIP | Status: DC

## 2015-02-25 ENCOUNTER — Telehealth: Payer: Self-pay | Admitting: Internal Medicine

## 2015-02-25 NOTE — Telephone Encounter (Signed)
Form completed and faxed to [facility]

## 2015-02-25 NOTE — Telephone Encounter (Signed)
[facility] has faxed over CMN form for pt's test strip rx. Please call if we did not receive [phone]

## 2015-02-28 ENCOUNTER — Telehealth: Payer: Self-pay | Admitting: Internal Medicine

## 2015-02-28 NOTE — Telephone Encounter (Signed)
Please call pt with status of form for test strips please.

## 2015-03-04 ENCOUNTER — Telehealth: Payer: Self-pay | Admitting: Internal Medicine

## 2015-03-04 MED ORDER — BROMOCRIPTINE MESYLATE 0.8 MG PO TABS: ORAL_TABLET | ORAL | Status: DC

## 2015-03-04 NOTE — Telephone Encounter (Signed)
Pt needs cycloset called in to [facility] on [address]

## 2015-03-04 NOTE — Telephone Encounter (Signed)
Done

## 2015-03-16 ENCOUNTER — Encounter: Payer: Self-pay | Admitting: Internal Medicine

## 2015-03-16 ENCOUNTER — Ambulatory Visit (INDEPENDENT_AMBULATORY_CARE_PROVIDER_SITE_OTHER): Payer: Medicare Other | Admitting: Internal Medicine

## 2015-03-16 VITALS — BP 90/52 | HR 59 | Ht 61.0 in | Wt 174.0 lb

## 2015-03-16 DIAGNOSIS — J4520 Mild intermittent asthma, uncomplicated: Secondary | ICD-10-CM | POA: Diagnosis not present

## 2015-03-16 DIAGNOSIS — J4530 Mild persistent asthma, uncomplicated: Secondary | ICD-10-CM | POA: Diagnosis not present

## 2015-03-16 MED ORDER — FLUTICASONE PROPIONATE 50 MCG/ACT NA SUSP: NASAL | Status: DC

## 2015-03-16 MED ORDER — ALBUTEROL SULFATE HFA 108 (90 BASE) MCG/ACT IN AERS: INHALATION_SPRAY | RESPIRATORY_TRACT | Status: DC

## 2015-03-16 NOTE — Patient Instructions (Addendum)
Order- office spirometry    Dx asthma milld intermittent  Sample Symbicort 160    1 puff then rinse mouth, twice daily  Use this up, then stop and see what you think.  Refill sent for 3 months flonase and Proair   Please call as needed

## 2015-03-16 NOTE — Progress Notes (Signed)
11/06/13- 47 yoF never smoker self-referred for allergy.  She has had perennial allergic rhinitis since childhood, improved during her teens but worse again since age 49. Triggers have included pollens, house dust, dry or humid air, strong odors and irritants. Previous allergy testing at [facility] in [place] and again in [place] around 2000 and. She members positive reactions to tree pollens, grass pollens, ragweed. She was on allergy shots for 2 years and thinks that helped her, but she had large local reactions. She has tried to minimize use of Flonase, but says if she skips it, symptoms flare. She prefers Careers adviser as an antihistamine. Singulair caused bad dreams. Multiple food intolerances-peanuts, citrus, dairy, weak/gluten-all causing irritable bowel symptoms. She says celiac disease was ruled out. Intolerance to latex but not contrast dye or aspirin. Treated with prednisone 17 years ago for urticaria. Never stung by insect. 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 Lives in a townhouse with air filters and and encasings on bedding. CAC. 2 dogs, 2 cats, no mold. Brother had anaphylactic reaction from yellow jacket sting. Both parents have "allergies".  02/04/14- 48 yoF never smoker followed for allergic rhinitis, allergic conjunctivitis, food intolerance/ irritable bowel, complicated by DM, HBP, Hx depression FOLLOWS FOR: using OTC eye drops for allergies due to increased pollen; itchy and watery eyes., She prefers brand-name Allegra over fexofenadine generic. Using Flonase. She intends to keep her cats and dog. She thinks her eyes are doing quite well now Allergy profile 11/06/2013-total IgE 651.9, broadly positive especially for cat  03/16/15-48 yoF never smoker followed for allergic rhinitis, allergic conjunctivitis, food intolerance/ irritable bowel, complicated by DM, HBP, Hx depression FOLLOWS FOR: Allergies have not changed since her last OV. Doing fairly well. Sometimes eyes itch,  nose runs. Uses rescue inhaler once or twice a week for chest tightness. Asthma does not wake her. Flonase for rhinitis, one puff each nostril. 2 puffs will cause nosebleed. Office spirometry 03/15/2015-within normal limits. FVC 2.47/82%, FEV1 1.93/77%, FEV1/FVC 78%, FEF 25-75 percent 1.95/67%.  ROS-see HPI Constitutional:   No-   weight loss, night sweats, fevers, chills, fatigue, lassitude. HEENT:   +  headaches, +difficulty swallowing, tooth/dental problems, sore throat,       No-  +sneezing,+ itching, no-ear ache,+nasal congestion, post nasal drip,  CV:  No-   chest pain, orthopnea, PND, swelling in lower extremities, anasarca,                                  dizziness, palpitations Resp: +shortness of breath with exertion or at rest.              No-   productive cough,  No non-productive cough,  No- coughing up of blood.              No-   change in color of mucus.  + wheezing.   Skin: No-   rash or lesions. GI:  +heartburn, indigestion, +abdominal pain, nausea, vomiting,  GU:  MS: + joint pain or swelling.   Neuro-     nothing unusual Psych:  No- change in mood or affect. No depression or anxiety.  No memory loss.  OBJ- Physical Exam General- Alert, Oriented, Affect-appropriate, Distress- none acute Skin- rash-none, lesions- none, excoriation- none Lymphadenopathy- none Head- atraumatic            Eyes- Gross vision intact, PERRLA, conjunctivae and secretions clear  Ears- Hearing, canals-normal            Nose- Clear, no-Septal dev, +mucus bridging, polyps, erosion, perforation             Throat- Mallampati II , mucosa clear , drainage- none, tonsils- atrophic Neck- flexible , trachea midline, no stridor , thyroid nl, carotid no bruit Chest - symmetrical excursion , unlabored           Heart/CV- RRR , +1/6 upper sternal murmur , no gallop  , no rub, nl s1 s2                           - JVD- none , edema- none, stasis changes- none, varices- none           Lung-  clear to P&A, wheeze- none, cough- none , dullness-none, rub- none           Chest wall-  Abd-  Br/ Gen/ Rectal- Not done, not indicated Extrem- cyanosis- none, clubbing, none, atrophy- none, strength- nl Neuro- grossly intact to observation

## 2015-03-16 NOTE — Progress Notes (Signed)
Quick Note:  LVM for pt to return call ______ 

## 2015-03-17 NOTE — Progress Notes (Signed)
Quick Note:  lmtcb for pt. ______ 

## 2015-03-18 ENCOUNTER — Telehealth: Payer: Self-pay | Admitting: Internal Medicine

## 2015-03-18 NOTE — Telephone Encounter (Signed)
Notes Recorded by Velvet BatheAshley L Caulfield, CMA on 03/18/2015 at 10:02 AM lmtcb X3 for pt. Letter sent. Notes Recorded by Nicanor AlconElise K Nagle, RN on 03/17/2015 at 9:34 AM lmtcb for pt. Notes Recorded by Karalee HeightAmanda P Cox, CMA on 03/16/2015 at 5:10 PM LVM for pt to return call. Notes Recorded by Waymon Budgelinton D Young, MD on 03/16/2015 at 2:49 PM Office spirometry- scores are normal, but close to borderline lower limits of normal. This would be consistent with asthma on a good day.        Results were left on pt's voicemail per her request. Nothing further was needed.

## 2015-03-20 NOTE — Assessment & Plan Note (Signed)
Flonase and occasional antihistamine are sufficient. Discussed epistaxis with steady use of nasal steroids.

## 2015-03-20 NOTE — Assessment & Plan Note (Addendum)
Occasional use of a rescue inhaler is sufficient much of the time but we discussed trial of Symbicort samples as a maintenance controller. Plan-Symbicort with instruction, office spirometry done

## 2015-03-29 ENCOUNTER — Other Ambulatory Visit: Payer: Self-pay | Admitting: Internal Medicine

## 2015-04-01 ENCOUNTER — Encounter: Payer: Self-pay | Admitting: Family

## 2015-04-01 ENCOUNTER — Ambulatory Visit (INDEPENDENT_AMBULATORY_CARE_PROVIDER_SITE_OTHER): Payer: Medicare Other | Admitting: Family

## 2015-04-01 VITALS — BP 110/84 | HR 53 | Temp 98.0°F | Resp 18 | Ht 61.0 in | Wt 172.4 lb

## 2015-04-01 DIAGNOSIS — M62838 Other muscle spasm: Secondary | ICD-10-CM

## 2015-04-01 DIAGNOSIS — Z8669 Personal history of other diseases of the nervous system and sense organs: Secondary | ICD-10-CM | POA: Diagnosis not present

## 2015-04-01 DIAGNOSIS — M6248 Contracture of muscle, other site: Secondary | ICD-10-CM

## 2015-04-01 DIAGNOSIS — G43009 Migraine without aura, not intractable, without status migrainosus: Secondary | ICD-10-CM | POA: Diagnosis not present

## 2015-04-01 MED ORDER — METHOCARBAMOL 500 MG PO TABS: 500.0000 mg | ORAL_TABLET | Freq: Three times a day (TID) | ORAL | Status: DC | PRN

## 2015-04-01 MED ORDER — NAPROXEN 500 MG PO TABS: 500.0000 mg | ORAL_TABLET | Freq: Two times a day (BID) | ORAL | Status: DC

## 2015-04-01 NOTE — Assessment & Plan Note (Signed)
Migraine headaches approximately twice per month currently controlled with naproxen sodium as needed. Continue current dosage of naproxen medication refilled. Follow-up if symptoms worsen or headaches are uncontrolled with naproxen.

## 2015-04-01 NOTE — Assessment & Plan Note (Signed)
Not currently experiencing muscle spasms. Well controlled with methocarbamol. Continue current dosage of methocarbamol and non-pharmacological treatment including heat and stretching. Follow up if symptoms worsen or not improve with current regimens.

## 2015-04-01 NOTE — Progress Notes (Signed)
Subjective:    Patient ID: [name], female    DOB: [birth year], 49 y.o.   MRN: [id number]  Chief Complaint  Patient presents with  . Establish Care    needs refill on naprosyn and robaxin     HPI:  [name] is a 49 y.o. female with a PMH of asthma, type 2 diabetes, hypertension, GERD, depression, PTSD, and migraines  who presents today for an office visit to establish care.  1.) Migraines - Previously diagnosed and experiences the associated symptoms of migraine headaches that have been going on for about 23 years. Headaches are described as throbbing, with sensitivity to light and sound combined with nausea without vomiting. Modifying factors include rest and naprosyn which have helped in the past. Frequency of headaches is approximately 2 per month. She is headache free all other times. t   2.) Muscle spasm - Previous diagnosed with the associated symptoms of muscle spasms located in her neck and shoulders. Frequency occurs about 1-2 times per month and is currently controlled with the robaxin. She takes the medication as prescribed and denies adverse side effects.   Allergies  Allergen Reactions  . Latex Itching and Swelling  . Penicillins Other (See Comments)    dizzy     Outpatient Prescriptions Prior to Visit  Medication Sig Dispense Refill  . albuterol (PROVENTIL HFA;VENTOLIN HFA) 108 (90 BASE) MCG/ACT inhaler 2 puffs every 4-6 hours as directed rescue inhaler 3 Inhaler 3  . atenolol (TENORMIN) 50 MG tablet TAKE 2 TABLETS BY MOUTH EVERY MORNING AND EVERY EVENING 120 tablet 4  . CYCLOSET 0.8 MG TABS TAKE 3 TABLETS BY MOUTH DAILY IN THE MORNING 90 tablet 2  . fexofenadine (ALLEGRA) 180 MG tablet Take 1 tablet (180 mg total) by mouth daily. 30 tablet prn  . fluticasone (FLONASE) 50 MCG/ACT nasal spray 1-2 puffs each nostril once daily 48 g 3  . glipiZIDE (GLUCOTROL) 5 MG tablet TAKE ONE TABLET BY MOUTH 2x a day 180 tablet 1  . glucose blood (ONE TOUCH ULTRA TEST)  test strip TEST BLOOD GLUCOSE 4 TIMES DAILY AS INSTRUCTED. DX: E11.9 125 each 5  . irbesartan (AVAPRO) 75 MG tablet TAKE 1 TABLET (75 MG TOTAL) BY MOUTH DAILY. 90 tablet 1  . naphazoline-pheniramine (VISINE-A) 0.025-0.3 % ophthalmic solution Place 1 drop into both eyes 2 (two) times daily.    Marland Kitchen OVER THE COUNTER MEDICATION 1 capsule daily. Probiotic renew life    . sertraline (ZOLOFT) 100 MG tablet TAKE 2 TABLETS BY MOUTH ONCE DAILY 180 tablet 0  . XULANE 150-35 MCG/24HR transdermal patch     . glipiZIDE (GLUCOTROL) 5 MG tablet TAKE ONE TABLET BY MOUTH EVERY DAY WITH SUPPER 30 tablet 0  . methocarbamol (ROBAXIN) 500 MG tablet Take 1 tablet (500 mg total) by mouth every 8 (eight) hours as needed for muscle spasms. 12 tablet 0  . naproxen (NAPROSYN) 500 MG tablet Take 1 tablet (500 mg total) by mouth 2 (two) times daily with a meal. 30 tablet 1  . sertraline (ZOLOFT) 100 MG tablet Take 2 tablets (200 mg total) by mouth daily. 180 tablet 0  . lansoprazole (PREVACID) 30 MG capsule TAKE 1 CAPSULE (30 MG TOTAL) BY MOUTH DAILY. 30 capsule 11   No facility-administered medications prior to visit.     Past Medical History  Diagnosis Date  . Depression   . Anxiety   . Asthma   . Diabetes mellitus type II   . Insomnia   .  PTSD (post-traumatic stress disorder)   . High blood pressure   . PCOS (polycystic ovarian syndrome)   . Thyroid disease   . IBS (irritable bowel syndrome)   . Food intolerance   . Seasonal allergies   . Headache   . Migraines      Past Surgical History  Procedure Laterality Date  . Cholecystectomy    . Gallbladder surgery       Family History  Problem Relation Age of Onset  . Bipolar disorder Mother   . Hypertension Mother   . Diabetes Mother   . Alzheimer's disease Father   . Hypertension Father   . Alzheimer's disease Maternal Grandmother   . Heart disease Paternal Grandfather      History   Social History  . Marital Status: Single    Spouse Name:  N/A  . Number of Children: 0  . Years of Education: 18   Occupational History  . Long Term Disability     PTSD   Social History Main Topics  . Smoking status: Never Smoker   . Smokeless tobacco: Never Used  . Alcohol Use: 0.0 oz/week    0.5 Standard drinks or equivalent per week     Comment: 1-2 drinks a month  . Drug Use: No  . Sexual Activity: Not Currently    Birth Control/ Protection: Pill   Other Topics Concern  . Not on file   Social History Narrative   Regular exercise: yes, walk   Caffeine use: occasionally tea   Fun: Social group events, walking her dogs, movies   Denies abuse and feels safe where she lives.        Review of Systems  Constitutional: Negative for fever and chills.  Eyes: Negative for visual disturbance.  Musculoskeletal: Negative for neck pain and neck stiffness.  Neurological: Negative for headaches.      Objective:    BP 110/84 mmHg  Pulse 53  Temp(Src) 98 F (36.7 C) (Oral)  Resp 18  Ht 5\' 1"  (1.549 m)  Wt 172 lb 6.4 oz (78.2 kg)  BMI 32.59 kg/m2  SpO2 97% Nursing note and vital signs reviewed.  Physical Exam  Constitutional: She is oriented to person, place, and time. She appears well-developed and well-nourished. No distress.  Neck: Neck supple.  The obvious deformity, discoloration, or edema noted. No palpable tenderness or muscle spasm elicited. Range of motion is normal. Compression test is negative; Spurling's is negative. Upper extremity pulses, sensation, and reflexes are intact and appropriate.  Cardiovascular: Normal rate, regular rhythm, normal heart sounds and intact distal pulses.   Pulmonary/Chest: Effort normal and breath sounds normal.  Neurological: She is alert and oriented to person, place, and time. She has normal reflexes. No cranial nerve deficit. She exhibits normal muscle tone. Coordination normal.  Skin: Skin is warm and dry.  Psychiatric: She has a normal mood and affect. Her behavior is normal. Judgment  and thought content normal.       Assessment & Plan:   Problem List Items Addressed This Visit      Cardiovascular and Mediastinum   Migraine - Primary    Migraine headaches approximately twice per month currently controlled with naproxen sodium as needed. Continue current dosage of naproxen medication refilled. Follow-up if symptoms worsen or headaches are uncontrolled with naproxen.      Relevant Medications   naproxen (NAPROSYN) 500 MG tablet   methocarbamol (ROBAXIN) 500 MG tablet     Musculoskeletal and Integument   Muscle spasms of  neck    Not currently experiencing muscle spasms. Well controlled with methocarbamol. Continue current dosage of methocarbamol and non-pharmacological treatment including heat and stretching. Follow up if symptoms worsen or not improve with current regimens.      Relevant Medications   methocarbamol (ROBAXIN) 500 MG tablet

## 2015-04-01 NOTE — Patient Instructions (Signed)
Thank you for choosing [facility].  Summary/Instructions:  Your prescription(s) have been submitted to your pharmacy or been printed and provided for you. Please take as directed and contact our office if you believe you are having problem(s) with the medication(s) or have any questions.  If your symptoms worsen or fail to improve, please contact our office for further instruction, or in case of emergency go directly to the emergency room at the closest medical facility.     

## 2015-04-01 NOTE — Progress Notes (Signed)
Pre visit review using our clinic review tool, if applicable. No additional management support is needed unless otherwise documented below in the visit note. 

## 2015-04-08 ENCOUNTER — Ambulatory Visit: Payer: Self-pay | Admitting: Internal Medicine

## 2015-04-19 ENCOUNTER — Other Ambulatory Visit: Payer: Self-pay | Admitting: *Deleted

## 2015-04-19 MED ORDER — GLUCOSE BLOOD VI STRP: ORAL_STRIP | Status: DC

## 2015-04-21 ENCOUNTER — Other Ambulatory Visit (HOSPITAL_COMMUNITY): Payer: Self-pay | Admitting: Psychiatry

## 2015-04-22 NOTE — Telephone Encounter (Signed)
PT needs a 3 months supply of Zoloft called in to [facility]. She will be out in a week.

## 2015-04-22 NOTE — Telephone Encounter (Signed)
PT called for a refill for Zoloft . Per Dr. Gilmore Laroche, pt is authorized for a refill Zoloft / Qty 180.  Prescription was sent to [facility]. Pt has a f/u appt on 04/29/15. PT verbalizes understanding.

## 2015-04-29 ENCOUNTER — Ambulatory Visit (INDEPENDENT_AMBULATORY_CARE_PROVIDER_SITE_OTHER): Payer: Medicare Other | Admitting: Psychiatry

## 2015-04-29 ENCOUNTER — Encounter (HOSPITAL_COMMUNITY): Payer: Self-pay | Admitting: Psychiatry

## 2015-04-29 VITALS — BP 122/79 | HR 80 | Ht 62.0 in | Wt 173.0 lb

## 2015-04-29 DIAGNOSIS — F411 Generalized anxiety disorder: Secondary | ICD-10-CM

## 2015-04-29 DIAGNOSIS — F3341 Major depressive disorder, recurrent, in partial remission: Secondary | ICD-10-CM

## 2015-04-29 DIAGNOSIS — F332 Major depressive disorder, recurrent severe without psychotic features: Secondary | ICD-10-CM

## 2015-04-29 DIAGNOSIS — F4310 Post-traumatic stress disorder, unspecified: Secondary | ICD-10-CM

## 2015-04-29 MED ORDER — SERTRALINE HCL 100 MG PO TABS: 200.0000 mg | ORAL_TABLET | Freq: Every day | ORAL | Status: DC
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
## 2015-05-06 ENCOUNTER — Telehealth: Payer: Self-pay | Admitting: Internal Medicine

## 2015-05-06 NOTE — Telephone Encounter (Signed)
Patient called and would like a refill on her Rx  Mrs. Pavlich states she has been taking more of the glipizide and needs a new Rx   Rx: Glipizide  Pharmacy: [facility]    Thank you

## 2015-05-09 MED ORDER — GLIPIZIDE 5 MG PO TABS: ORAL_TABLET | ORAL | Status: DC

## 2015-05-09 NOTE — Telephone Encounter (Signed)
Medication refilled

## 2015-05-20 ENCOUNTER — Other Ambulatory Visit (INDEPENDENT_AMBULATORY_CARE_PROVIDER_SITE_OTHER): Payer: Medicare Other | Admitting: *Deleted

## 2015-05-20 ENCOUNTER — Encounter: Payer: Self-pay | Admitting: Internal Medicine

## 2015-05-20 ENCOUNTER — Ambulatory Visit (INDEPENDENT_AMBULATORY_CARE_PROVIDER_SITE_OTHER): Payer: Medicare Other | Admitting: Internal Medicine

## 2015-05-20 VITALS — BP 118/62 | HR 55 | Temp 97.6°F | Resp 12 | Wt 170.6 lb

## 2015-05-20 DIAGNOSIS — E119 Type 2 diabetes mellitus without complications: Secondary | ICD-10-CM

## 2015-05-20 LAB — POCT GLYCOSYLATED HEMOGLOBIN (HGB A1C): Hemoglobin A1C: 6.3

## 2015-05-20 NOTE — Progress Notes (Signed)
Patient ID: JAYLAN DUGGAR, female   DOB: 10/31/1965, 49 y.o.   MRN: [id number]  HPI: MAYMUNA DETZEL is a 49 y.o.-year-old female, returning for f/u for DM2, dx 2013, non-insulin-dependent, well controlled, without complications. Last visit 3 mo ago.  Last hemoglobin A1c was: Lab Results  Component Value Date   HGBA1C 6.7* 02/16/2015   HGBA1C 7.1* 11/03/2014   HGBA1C 7.1* 07/02/2014   Pt is on a regimen of: - Cycloset - 1.6 mg >> 2.4 mg daily (increased in 06/2014) - Glipizide 01-25-19 mg (!) 2x a day - using it with carb meals (at last visit, I advised her to only use 5 mg 2x a day)  She had various intolerances to different diabetes medications:  Metformin ER >> gastric discomfort and severe diarrhea (loss of bowel control). Glipizide: low CBGs, eating more, weight gain  Januvia >> tried for 1-2 months >> left chest pain  She was wondering if she could take Invokana, however she had multiple vaginal yeast infections 1-2 years ago. She has to urinate 3-4 x a night.   At last visit, she described reactive hypoglycemia symptoms >> I advised her to stop Quercetin (was taking it for allergies), as it is known to increase insulin production by the pancreas.  Pt checks her sugars 1-3x a day and they are: - am: 100-134 >> 116-126 >> 110-130 >> 136-140, 240 >> 112-157, 173 >> 115-144, 156 - later in the day: 148-160 (220) >> 180-280, but few checks >> 94-208, 277 >> 89-165, 187 (higher sugars after meals) - nighttime: 111-156 No lows. Lowest sugar was 68. Highest sugar was 277 >> 262x1 after a large meal.  Has many food intolerances: - wheat >> cannot eat bread or pastry.  - raw vegetables, beans, cruciferous vegetables, fruit >> diarrhea.  - No dairy - She does not digest fat well after her cholecystectomy in 2011.   - No chronic kidney disease, last BUN/creatinine was:  Lab Results  Component Value Date   BUN 11 04/02/2014   CREATININE 0.57 04/02/2014  On Irbesartan.  - has a h/o  HL - Last set of lipids greatly improved. She is on Questran - takes it only once a mo, for diarrhea. Lab Results  Component Value Date   CHOL 228* 04/02/2014   HDL 120 04/02/2014   LDLCALC 85 04/02/2014   TRIG 116 04/02/2014   CHOLHDL 1.9 04/02/2014  Previous cholesterol levels were: 325/197/132/154. Avoids statins as she was told these can increase depression.  - last dilated eye exam was in Spring 2015 (Dr Randon Goldsmith) - no DR. - no numbness and tingling in her legs.  She also has a history of PCOS - she has seen Dr. Rosine Door at [facility] in the past - note from 08/26/2012: perimenopause, and at that time she was on Yasmin, subsequently changed to Necon. She sees Dr. Marvel Plan with OB/GYN. She has PTSD, MDD, insomnia, anxiety, asthma, HTN, GERD, status post cholecystectomy, anemia, menorrhagia, improved a little on new OCP, transaminitis.  She continues to see an acupuncturist >> feeling much better and sleeping better, too.  I reviewed pt's medications, allergies, PMH, social hx, family hx, and changes were documented in the history of present illness. Otherwise, unchanged from my initial visit note.   ROS: Constitutional: + weight loss, no fatigue, no subjective hyperthermia/hypothermia Eyes: no blurry vision, no xerophthalmia ENT: no sore throat, no nodules palpated in throat, no dysphagia/odynophagia, no hoarseness Cardiovascular: no CP/SOB/palpitations/leg swelling Respiratory: no cough/SOB Gastrointestinal: no N/V/D/C  Musculoskeletal: no muscle/joint aches Skin: no rashes Neurological: no tremors/numbness/tingling/dizziness  PE: BP 118/62 mmHg  Pulse 55  Temp(Src) 97.6 F (36.4 C) (Oral)  Resp 12  Wt 170 lb 9.6 oz (77.384 kg)  SpO2 96% Body mass index is 31.2 kg/(m^2).  Wt Readings from Last 3 Encounters:  05/20/15 170 lb 9.6 oz (77.384 kg)  04/29/15 173 lb (78.472 kg)  04/01/15 172 lb 6.4 oz (78.2 kg)   Constitutional: overweight, truncal obesity, in NAD Eyes:  PERRLA, EOMI, no exophthalmos ENT: moist mucous membranes, no thyromegaly, no cervical lymphadenopathy Cardiovascular: RRR, 2/6 SEM, no MRG Respiratory: CTA B Gastrointestinal: abdomen soft, NT, ND, BS+ Musculoskeletal: no deformities, strength intact in all 4 Skin: moist, warm, no rashes  ASSESSMENT: 1. DM2, non-insulin-dependent, fairly well controlled, without complications  PLAN:  1. DM2 Patient with controlled DM2, with improved control c/w last visit 2/2 controlling her diet.  She has several intolerances to diabetic meds - metformin (even extended release), Januvia, glipizide. We cannot use Invokana due to h/o UTIs, and her nocturia. She continues on Cycloset 2.4 mg daily and Glipizide, which she takes in excess: usually 5-10 mg bid, but has also used 30 mg daily. I explained how this works and I advised her to use < or = 20 mg daily, but ideally she would use max 5 mg 2x a day. I advised her to:  Patient Instructions  Please continue: - Cycloset 2.4 mg daily in am - Glipizide 5-10 mg 2x a day   Please return in 3 months with your sugar log.   - needs a new eye exam >> advised to schedule - will check HbA1C today >> 6.3%(improved!) - return to clinic in 3 months with her sugar log

## 2015-05-20 NOTE — Patient Instructions (Signed)
Please continue: - Cycloset 2.4 mg daily in am - Glipizide 5-10 mg 2x a day  Please return in 3 months with your sugar log.

## 2015-06-16 ENCOUNTER — Other Ambulatory Visit: Payer: Self-pay | Admitting: Internal Medicine

## 2015-06-26 ENCOUNTER — Other Ambulatory Visit: Payer: Self-pay | Admitting: Family

## 2015-07-19 ENCOUNTER — Encounter: Payer: Self-pay | Admitting: Family

## 2015-07-19 ENCOUNTER — Other Ambulatory Visit: Payer: Self-pay | Admitting: *Deleted

## 2015-07-19 ENCOUNTER — Other Ambulatory Visit: Payer: Self-pay

## 2015-07-19 MED ORDER — IRBESARTAN 75 MG PO TABS: ORAL_TABLET | ORAL | Status: DC

## 2015-08-26 ENCOUNTER — Ambulatory Visit (INDEPENDENT_AMBULATORY_CARE_PROVIDER_SITE_OTHER): Payer: Medicare Other | Admitting: Internal Medicine

## 2015-08-26 ENCOUNTER — Encounter: Payer: Self-pay | Admitting: Internal Medicine

## 2015-08-26 VITALS — BP 112/64 | HR 65 | Temp 97.9°F | Resp 12 | Wt 167.0 lb

## 2015-08-26 DIAGNOSIS — E119 Type 2 diabetes mellitus without complications: Secondary | ICD-10-CM

## 2015-08-26 DIAGNOSIS — N921 Excessive and frequent menstruation with irregular cycle: Secondary | ICD-10-CM

## 2015-08-26 LAB — COMPREHENSIVE METABOLIC PANEL
ALK PHOS: 62 U/L (ref 39–117)
ALT: 12 U/L (ref 0–35)
AST: 14 U/L (ref 0–37)
Albumin: 3.8 g/dL (ref 3.5–5.2)
BILIRUBIN TOTAL: 0.3 mg/dL (ref 0.2–1.2)
BUN: 14 mg/dL (ref 6–23)
CO2: 30 mEq/L (ref 19–32)
Calcium: 10 mg/dL (ref 8.4–10.5)
Chloride: 99 mEq/L (ref 96–112)
Creatinine, Ser: 0.72 mg/dL (ref 0.40–1.20)
GFR: 92.01 mL/min (ref >60.00)
GLUCOSE: 160 mg/dL — AB (ref 70–99)
Potassium: 4.6 mEq/L (ref 3.5–5.1)
SODIUM: 136 meq/L (ref 135–145)
TOTAL PROTEIN: 7.4 g/dL (ref 6.0–8.3)

## 2015-08-26 LAB — CBC
HCT: 33.8 % — ABNORMAL LOW (ref 36.0–46.0)
HEMOGLOBIN: 10.9 g/dL — AB (ref 12.0–15.0)
MCHC: 32.3 g/dL (ref 30.0–36.0)
MCV: 81.9 fl (ref 78.0–100.0)
PLATELETS: 248 10*3/uL (ref 150.0–400.0)
RBC: 4.13 Mil/uL (ref 3.87–5.11)
RDW: 16.2 % — AB (ref 11.5–15.5)
WBC: 9.2 10*3/uL (ref 4.0–10.5)

## 2015-08-26 LAB — LIPID PANEL
Cholesterol: 228 mg/dL — ABNORMAL HIGH (ref 0–200)
HDL: 82.6 mg/dL (ref >39.00)
LDL Cholesterol: 114 mg/dL — ABNORMAL HIGH (ref 0–99)
NONHDL: 144.93
Total CHOL/HDL Ratio: 3
Triglycerides: 156 mg/dL — ABNORMAL HIGH (ref 0.0–149.0)
VLDL: 31.2 mg/dL (ref 0.0–40.0)

## 2015-08-26 LAB — HEMOGLOBIN A1C: HEMOGLOBIN A1C: 6.6 % — AB (ref 4.6–6.5)

## 2015-08-26 LAB — MICROALBUMIN / CREATININE URINE RATIO
Creatinine,U: 143.5 mg/dL
MICROALB/CREAT RATIO: 1.1 mg/g (ref 0.0–30.0)
Microalb, Ur: 1.6 mg/dL (ref 0.0–1.9)

## 2015-08-26 NOTE — Progress Notes (Signed)
Patient ID: [name], female   DOB: 11-09-1965, 49 y.o.   MRN: [id number]  HPI: [name] is a 49 y.o.-year-old female, returning for f/u for DM2, dx 2013, non-insulin-dependent, well controlled, without complications. Last visit 3 mo ago.  Last hemoglobin A1c was: Lab Results  Component Value Date   HGBA1C 6.3 05/20/2015   HGBA1C 6.7* 02/16/2015   HGBA1C 7.1* 11/03/2014   Pt is on a regimen of: - Cycloset 2.4 mg daily (increased in 06/2014) - Glipizide 5-10 2x a day - using it with carb meals   She had various intolerances to different diabetes medications:  Metformin ER >> gastric discomfort and severe diarrhea (loss of bowel control). Glipizide: low CBGs, eating more, weight gain  Januvia >> tried for 1-2 months >> left chest pain  She was wondering if she could take Invokana, however she had multiple vaginal yeast infections 1-2 years ago. She has to urinate 3-4 x a night.   At last visit, she described reactive hypoglycemia symptoms >> I advised her to stop Quercetin (was taking it for allergies), as it is known to increase insulin production by the pancreas.  Pt checks her sugars 1-3x a day and they are: - am: 100-134 >> 116-126 >> 110-130 >> 136-140, 240 >> 112-157, 173 >> 115-144, 156 >> 120's - 1h after b'fast: 207 (after banana bread) - before lunch: 89-165, 187 >> 117, 119 - 2h after lunch: 187 - nighttime: 111-156 >> 153-185 >> 153 (after snack) No lows. Lowest sugar was 68. Highest sugar was 277 >> 262x1 after a large meal.  Has many food intolerances: - wheat >> cannot eat bread or pastry.  - raw vegetables, beans, cruciferous vegetables, fruit >> diarrhea.  - No dairy - She does not digest fat well after her cholecystectomy in 2011.   - No chronic kidney disease, last BUN/creatinine was:  Lab Results  Component Value Date   BUN 11 04/02/2014   CREATININE 0.57 04/02/2014  On Irbesartan.  - has a h/o HL - Last set of lipids greatly improved. She is  on Questran - takes it only once a mo, for diarrhea. Lab Results  Component Value Date   CHOL 228* 04/02/2014   HDL 120 04/02/2014   LDLCALC 85 04/02/2014   TRIG 116 04/02/2014   CHOLHDL 1.9 04/02/2014  Previous cholesterol levels were: 325/197/132/154. Avoids statins as she was told these can increase depression.  - last dilated eye exam was in Spring 2015 (Dr Randon Goldsmith) - no DR. - no numbness and tingling in her legs.  She also has a history of PCOS - she has seen Dr. Rosine Door at [facility] in the past - note from 08/26/2012: perimenopause, and at that time she was on Yasmin, subsequently changed to Necon. She sees Dr. Marvel Plan with OB/GYN. She has PTSD, MDD, insomnia, anxiety, asthma, HTN, GERD, status post cholecystectomy, anemia, menorrhagia, improved a little on new OCP, transaminitis. Recent increased uterine bleeding (on Necon) for 5 weeks. She also has increased fatigue.  She continues to see an acupuncturist >> feeling much better and sleeping better, too.  I reviewed pt's medications, allergies, PMH, social hx, family hx, and changes were documented in the history of present illness. Otherwise, unchanged from my initial visit note.   ROS: Constitutional: no weight loss/gain, + fatigue, no subjective hyperthermia/hypothermia Eyes: no blurry vision, no xerophthalmia ENT: no sore throat, no nodules palpated in throat, no dysphagia/odynophagia, no hoarseness Cardiovascular: no CP/SOB/palpitations/leg swelling Respiratory: no cough/SOB Gastrointestinal:  no N/V/D/C Musculoskeletal: no muscle/joint aches Skin: no rashes Neurological: no tremors/numbness/tingling/dizziness  PE: BP 112/64 mmHg  Pulse 65  Temp(Src) 97.9 F (36.6 C) (Oral)  Resp 12  Wt 167 lb (75.751 kg)  SpO2 94% Body mass index is 30.54 kg/(m^2).  Wt Readings from Last 3 Encounters:  08/26/15 167 lb (75.751 kg)  05/20/15 170 lb 9.6 oz (77.384 kg)  04/29/15 173 lb (78.472 kg)   Constitutional:  overweight, truncal obesity, in NAD Eyes: PERRLA, EOMI, no exophthalmos ENT: moist mucous membranes, no thyromegaly, no cervical lymphadenopathy Cardiovascular: RRR, 2/6 SEM, no MRG Respiratory: CTA B Gastrointestinal: abdomen soft, NT, ND, BS+ Musculoskeletal: no deformities, strength intact in all 4 Skin: moist, warm, no rashes  ASSESSMENT: 1. DM2, non-insulin-dependent, fairly well controlled, without complications  2. Metrorrhagia  PLAN:  1. DM2 Patient with controlled DM2, with improved control 2/2 controlling her diet. Last A1c was great, at 6.3%! At this visit, she has very few CBG checks, but not much changed from before. She has several intolerances to diabetic meds - metformin (even extended release), Januvia, glipizide. We cannot use Invokana due to h/o UTIs, and her nocturia. She continues on Cycloset 2.4 mg daily and Glipizide, 5-10 mg bid. I advised her to add 1/2 Glipizide tab if needed before a larger meal. - I advised her to:  Patient Instructions  Please continue: - Cycloset 2.4 mg daily in am - Glipizide 5-10 mg 2x a day   You can take an extra 2.5 mg Glipizide for a larger meal.  Please stop at the lab.  Please return in 3 months with your sugar log.   - needs a new eye exam >> advised to schedule! - will check HbA1C today, along with Lipid panel, CMP, ACR - return to clinic in 3 months with her sugar log  2. Metrorrhagia - check CBC - advised her to contact Dr Adalberto Ill  Office Visit on 08/26/2015  Component Date Value Ref Range Status  . Sodium 08/26/2015 136  135 - 145 mEq/L Final  . Potassium 08/26/2015 4.6  3.5 - 5.1 mEq/L Final  . Chloride 08/26/2015 99  96 - 112 mEq/L Final  . CO2 08/26/2015 30  19 - 32 mEq/L Final  . Glucose, Bld 08/26/2015 160* 70 - 99 mg/dL Final  . BUN 40/98/1191 14  6 - 23 mg/dL Final  . Creatinine, Ser 08/26/2015 0.72  0.40 - 1.20 mg/dL Final  . Total Bilirubin 08/26/2015 0.3  0.2 - 1.2 mg/dL Final  . Alkaline Phosphatase  08/26/2015 62  39 - 117 U/L Final  . AST 08/26/2015 14  0 - 37 U/L Final  . ALT 08/26/2015 12  0 - 35 U/L Final  . Total Protein 08/26/2015 7.4  6.0 - 8.3 g/dL Final  . Albumin 47/82/9562 3.8  3.5 - 5.2 g/dL Final  . Calcium 13/04/6577 10.0  8.4 - 10.5 mg/dL Final  . GFR 46/96/2952 92.01  >60.00 mL/min Final  . Cholesterol 08/26/2015 228* 0 - 200 mg/dL Final   ATP III Classification       Desirable:  < 200 mg/dL               Borderline High:  200 - 239 mg/dL          High:  > = 841 mg/dL  . Triglycerides 08/26/2015 156.0* 0.0 - 149.0 mg/dL Final   Normal:  <324 mg/dLBorderline High:  150 - 199 mg/dL  . HDL 08/26/2015 82.60  >39.00 mg/dL Final  .  VLDL 08/26/2015 31.2  0.0 - 40.0 mg/dL Final  . LDL Cholesterol 08/26/2015 114* 0 - 99 mg/dL Final  . Total CHOL/HDL Ratio 08/26/2015 3   Final                  Men          Women1/2 Average Risk     3.4          3.3Average Risk          5.0          4.42X Average Risk          9.6          7.13X Average Risk          15.0          11.0                      . NonHDL 08/26/2015 144.93   Final   NOTE:  Non-HDL goal should be 30 mg/dL higher than patient's LDL goal (i.e. LDL goal of < 70 mg/dL, would have non-HDL goal of < 100 mg/dL)  . WBC 08/26/2015 9.2  4.0 - 10.5 K/uL Final  . RBC 08/26/2015 4.13  3.87 - 5.11 Mil/uL Final  . Platelets 08/26/2015 248.0  150.0 - 400.0 K/uL Final  . Hemoglobin 08/26/2015 10.9* 12.0 - 15.0 g/dL Final  . HCT 16/10/960412/05/2015 33.8* 36.0 - 46.0 % Final  . MCV 08/26/2015 81.9  78.0 - 100.0 fl Final  . MCHC 08/26/2015 32.3  30.0 - 36.0 g/dL Final  . RDW 54/09/811912/05/2015 16.2* 11.5 - 15.5 % Final  . Microalb, Ur 08/26/2015 1.6  0.0 - 1.9 mg/dL Final  . Creatinine,U 14/78/295612/05/2015 143.5   Final  . Microalb Creat Ratio 08/26/2015 1.1  0.0 - 30.0 mg/g Final  . Hgb A1c MFr Bld 08/26/2015 6.6* 4.6 - 6.5 % Final   Glycemic Control Guidelines for People with Diabetes:Non Diabetic:  <6%Goal of Therapy: <7%Additional Action Suggested:  >8%     Message sent: Dear Ms. Nile RiggsShapiro, Hemoglobin A1c is a little be higher, but still very good. You do not have significant proteins in your urine. Your bad cholesterol, LDL, is higher than before, but not far from target. Please continue to play attention to your diet.  Your hemoglobin is similar to the level that we had from 1.5 years ago. Sincerely, Carlus Pavlovristina Sora Vrooman MD

## 2015-08-26 NOTE — Patient Instructions (Addendum)
Please continue: - Cycloset 2.4 mg daily in am - Glipizide 5-10 mg 2x a day   You can take an extra 2.5 mg Glipizide for a larger meal.  Please stop at the lab.  Please return in 3 months with your sugar log.

## 2015-08-27 ENCOUNTER — Other Ambulatory Visit: Payer: Self-pay | Admitting: Internal Medicine

## 2015-08-29 ENCOUNTER — Other Ambulatory Visit: Payer: Self-pay | Admitting: Internal Medicine

## 2015-09-11 ENCOUNTER — Other Ambulatory Visit: Payer: Self-pay | Admitting: Internal Medicine

## 2015-10-28 ENCOUNTER — Ambulatory Visit (HOSPITAL_COMMUNITY): Payer: Medicare Other | Admitting: Psychiatry

## 2015-11-16 ENCOUNTER — Ambulatory Visit (HOSPITAL_COMMUNITY): Payer: Medicare Other | Admitting: Psychiatry

## 2015-12-23 ENCOUNTER — Ambulatory Visit (INDEPENDENT_AMBULATORY_CARE_PROVIDER_SITE_OTHER): Payer: Medicare Other | Admitting: Internal Medicine

## 2015-12-23 ENCOUNTER — Other Ambulatory Visit (INDEPENDENT_AMBULATORY_CARE_PROVIDER_SITE_OTHER): Payer: Medicare Other | Admitting: *Deleted

## 2015-12-23 ENCOUNTER — Encounter: Payer: Self-pay | Admitting: Internal Medicine

## 2015-12-23 VITALS — BP 112/60 | HR 58 | Temp 98.5°F | Resp 12 | Wt 170.0 lb

## 2015-12-23 DIAGNOSIS — E119 Type 2 diabetes mellitus without complications: Secondary | ICD-10-CM

## 2015-12-23 LAB — POCT GLYCOSYLATED HEMOGLOBIN (HGB A1C): HEMOGLOBIN A1C: 6.7

## 2015-12-23 MED ORDER — BROMOCRIPTINE MESYLATE 0.8 MG PO TABS: ORAL_TABLET | ORAL | Status: DC

## 2015-12-23 NOTE — Progress Notes (Signed)
Patient ID: [name], female   DOB: October 03, 1965, 50 y.o.   MRN: [id number]  HPI: [name] is a 50 y.o.-year-old female, returning for f/u for DM2, dx 2013, non-insulin-dependent, well controlled, without complications. Last visit 4 mo ago.  Last hemoglobin A1c was: Lab Results  Component Value Date   HGBA1C 6.6* 08/26/2015   HGBA1C 6.3 05/20/2015   HGBA1C 6.7* 02/16/2015   Pt is on a regimen of: - Cycloset 2.4 mg daily (increased in 06/2014) - Glipizide 2.5 mg 4x a day (the whole tablet of 5 mg >> low CBG)  She had various intolerances to different diabetes medications:  Metformin ER >> gastric discomfort and severe diarrhea (loss of bowel control). Glipizide: low CBGs, eating more, weight gain  Januvia >> tried for 1-2 months >> left chest pain  She was wondering if she could take Invokana, however she had multiple vaginal yeast infections 1-2 years ago. She has to urinate 3-4 x a night.   At last visit, she described reactive hypoglycemia symptoms >> I advised her to stop Quercetin (was taking it for allergies), as it is known to increase insulin production by the pancreas.  Pt is not checking sugars as she has a lot of stress since last visit - reviewed sugars from before: - am: 100-134 >> 116-126 >> 110-130 >> 136-140, 240 >> 112-157, 173 >> 115-144, 156 >> 120's - 1h after b'fast: 207 (after banana bread) - before lunch: 89-165, 187 >> 117, 119 - 2h after lunch: 187 - nighttime: 111-156 >> 153-185 >> 153 (after snack)  Has many food intolerances: - wheat >> cannot eat bread or pastry.  - raw vegetables, beans, cruciferous vegetables, fruit >> diarrhea.  - No dairy - She does not digest fat well after her cholecystectomy in 2011.   - No chronic kidney disease, last BUN/creatinine was:  Lab Results  Component Value Date   BUN 14 08/26/2015   CREATININE 0.72 08/26/2015  On Irbesartan.  - has a h/o HL - Last set of lipids greatly improved. She is on Questran -  takes it only once a mo, for diarrhea. Lab Results  Component Value Date   CHOL 228* 08/26/2015   HDL 82.60 08/26/2015   LDLCALC 114* 08/26/2015   TRIG 156.0* 08/26/2015   CHOLHDL 3 08/26/2015  Previous cholesterol levels were: 325/197/132/154. Avoids statins as she was told these can increase depression.  - last dilated eye exam was in Spring 2015 (Dr Randon GoldsmithLyles) - no DR. New appt this month. - + numbness and tingling in her legs. She is on R enantiomer of alpha lipoic acid.  She also has a history of PCOS - [facility] - note from 08/26/2012: perimenopause, and at that time she was on Yasmin, subsequently changed to Necon. She sees Dr. Marvel PlanMichelle Grewall with OB/GYN. She has PTSD, MDD, insomnia, anxiety, asthma, HTN, GERD, status post cholecystectomy, anemia, menorrhagia, improved a little on new OCP, transaminitis. She also had increased uterine bleeding (on Necon) at last visit.  She continues to see an acupuncturist >> feeling much better and sleeping better, too.  I reviewed pt's medications, allergies, PMH, social hx, family hx, and changes were documented in the history of present illness. Otherwise, unchanged from my initial visit note. Started to take Nettle powder >> helps with diarrhea, anemia, insomnia.   ROS: Constitutional: no weight loss/gain, + fatigue, no subjective hyperthermia/hypothermia Eyes: no blurry vision, no xerophthalmia ENT: no sore  throat, no nodules palpated in throat, no dysphagia/odynophagia, no hoarseness Cardiovascular: no CP/SOB/palpitations/leg swelling Respiratory: no cough/SOB Gastrointestinal: no N/V/D/C Musculoskeletal: no muscle/joint aches Skin: no rashes Neurological: no tremors/numbness/tingling/dizziness  PE: BP 112/60 mmHg  Pulse 58  Temp(Src) 98.5 F (36.9 C) (Oral)  Resp 12  Wt 170 lb (77.111 kg)  SpO2 97% Body mass index is 31.09 kg/(m^2).  Wt Readings from Last 3 Encounters:  12/23/15 170 lb  (77.111 kg)  08/26/15 167 lb (75.751 kg)  05/20/15 170 lb 9.6 oz (77.384 kg)   Constitutional: overweight, truncal obesity, in NAD Eyes: PERRLA, EOMI, no exophthalmos ENT: moist mucous membranes, no thyromegaly, no cervical lymphadenopathy Cardiovascular: RRR, 2/6 SEM, no MRG Respiratory: CTA B Gastrointestinal: abdomen soft, NT, ND, BS+ Musculoskeletal: no deformities, strength intact in all 4, brisk reflexes in all 4 Skin: moist, warm, no rashes  ASSESSMENT: 1. DM2, non-insulin-dependent, fairly well controlled, without complications  PLAN:  1. DM2 - Patient with controlled DM2, with good control. Last A1c was great, at 6.6%! She did not check sugars since last visit >> strongly advised to start checking!  - She has several intolerances to diabetic meds - metformin (even extended release), Januvia, glipizide. We cannot use Invokana due to h/o UTIs, and her nocturia. She continues on Cycloset and Glipizide. Will continue. - I advised her to:  Patient Instructions  Please continue: - Cycloset 2.4 mg daily in am - Glipizide 2.5 mg 4x a day   Please return in 3 months with your sugar log.   - needs a new eye exam >> advised to schedule! - will check HbA1C today >> 6.7% (stable) - return to clinic in 3 months with her sugar log

## 2015-12-23 NOTE — Patient Instructions (Addendum)
Please continue: - Cycloset 2.4 mg daily in am - Glipizide 2.5 mg 4x a day   Please return in 3 months with your sugar log.

## 2015-12-28 DIAGNOSIS — Z0100 Encounter for examination of eyes and vision without abnormal findings: Secondary | ICD-10-CM | POA: Diagnosis not present

## 2015-12-28 DIAGNOSIS — E119 Type 2 diabetes mellitus without complications: Secondary | ICD-10-CM | POA: Diagnosis not present

## 2015-12-28 LAB — HM DIABETES EYE EXAM

## 2016-01-02 ENCOUNTER — Other Ambulatory Visit: Payer: Self-pay | Admitting: Internal Medicine

## 2016-01-02 ENCOUNTER — Other Ambulatory Visit (HOSPITAL_COMMUNITY): Payer: Self-pay | Admitting: Psychiatry

## 2016-01-02 ENCOUNTER — Encounter: Payer: Self-pay | Admitting: Family

## 2016-01-02 NOTE — Telephone Encounter (Signed)
[facility] . Per Dr. Gilmore LarocheAkhtar, medication request is denied. Pt will need to schedule an appt. LVM for pt to return call to clinic to schedule appt.

## 2016-01-11 ENCOUNTER — Other Ambulatory Visit: Payer: Self-pay | Admitting: Family

## 2016-01-18 ENCOUNTER — Encounter (HOSPITAL_COMMUNITY): Payer: Self-pay | Admitting: Psychiatry

## 2016-01-18 ENCOUNTER — Ambulatory Visit (INDEPENDENT_AMBULATORY_CARE_PROVIDER_SITE_OTHER): Payer: Medicare Other | Admitting: Psychiatry

## 2016-01-18 VITALS — BP 118/64 | HR 61 | Ht 62.0 in | Wt 169.0 lb

## 2016-01-18 DIAGNOSIS — F411 Generalized anxiety disorder: Secondary | ICD-10-CM | POA: Diagnosis not present

## 2016-01-18 DIAGNOSIS — F4310 Post-traumatic stress disorder, unspecified: Secondary | ICD-10-CM

## 2016-01-18 DIAGNOSIS — F3341 Major depressive disorder, recurrent, in partial remission: Secondary | ICD-10-CM

## 2016-01-18 MED ORDER — SERTRALINE HCL 100 MG PO TABS: 200.0000 mg | ORAL_TABLET | Freq: Every day | ORAL | Status: DC
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
## 2016-01-24 ENCOUNTER — Ambulatory Visit (INDEPENDENT_AMBULATORY_CARE_PROVIDER_SITE_OTHER): Payer: Medicare Other | Admitting: Family

## 2016-01-24 ENCOUNTER — Other Ambulatory Visit (INDEPENDENT_AMBULATORY_CARE_PROVIDER_SITE_OTHER): Payer: Medicare Other

## 2016-01-24 ENCOUNTER — Encounter: Payer: Self-pay | Admitting: Family

## 2016-01-24 VITALS — BP 102/68 | HR 63 | Temp 98.3°F | Resp 14 | Ht 61.0 in | Wt 167.0 lb

## 2016-01-24 DIAGNOSIS — Z0000 Encounter for general adult medical examination without abnormal findings: Secondary | ICD-10-CM | POA: Diagnosis not present

## 2016-01-24 DIAGNOSIS — J4530 Mild persistent asthma, uncomplicated: Secondary | ICD-10-CM | POA: Diagnosis not present

## 2016-01-24 DIAGNOSIS — E039 Hypothyroidism, unspecified: Secondary | ICD-10-CM

## 2016-01-24 DIAGNOSIS — M6248 Contracture of muscle, other site: Secondary | ICD-10-CM | POA: Diagnosis not present

## 2016-01-24 DIAGNOSIS — E785 Hyperlipidemia, unspecified: Secondary | ICD-10-CM

## 2016-01-24 DIAGNOSIS — I10 Essential (primary) hypertension: Secondary | ICD-10-CM

## 2016-01-24 DIAGNOSIS — M62838 Other muscle spasm: Secondary | ICD-10-CM

## 2016-01-24 LAB — TSH: TSH: 2.91 u[IU]/mL (ref 0.35–4.50)

## 2016-01-24 LAB — CBC
HCT: 35.8 % — ABNORMAL LOW (ref 36.0–46.0)
Hemoglobin: 12 g/dL (ref 12.0–15.0)
MCHC: 33.5 g/dL (ref 30.0–36.0)
MCV: 81 fl (ref 78.0–100.0)
Platelets: 326 10*3/uL (ref 150.0–400.0)
RBC: 4.42 Mil/uL (ref 3.87–5.11)
RDW: 15.8 % — ABNORMAL HIGH (ref 11.5–15.5)
WBC: 12.6 10*3/uL — AB (ref 4.0–10.5)

## 2016-01-24 LAB — COMPREHENSIVE METABOLIC PANEL
ALBUMIN: 4.1 g/dL (ref 3.5–5.2)
ALK PHOS: 71 U/L (ref 39–117)
ALT: 17 U/L (ref 0–35)
AST: 19 U/L (ref 0–37)
BILIRUBIN TOTAL: 0.3 mg/dL (ref 0.2–1.2)
BUN: 17 mg/dL (ref 6–23)
CALCIUM: 9.9 mg/dL (ref 8.4–10.5)
CHLORIDE: 96 meq/L (ref 96–112)
CO2: 25 mEq/L (ref 19–32)
CREATININE: 0.94 mg/dL (ref 0.40–1.20)
GFR: 67.52 mL/min (ref >60.00)
Glucose, Bld: 154 mg/dL — ABNORMAL HIGH (ref 70–99)
Potassium: 5 mEq/L (ref 3.5–5.1)
Sodium: 135 mEq/L (ref 135–145)
TOTAL PROTEIN: 8.2 g/dL (ref 6.0–8.3)

## 2016-01-24 LAB — LIPID PANEL
CHOL/HDL RATIO: 3
CHOLESTEROL: 373 mg/dL — AB (ref 0–200)
HDL: 128.2 mg/dL (ref >39.00)
NonHDL: 244.63
TRIGLYCERIDES: 205 mg/dL — AB (ref 0.0–149.0)
VLDL: 41 mg/dL — ABNORMAL HIGH (ref 0.0–40.0)

## 2016-01-24 LAB — LDL CHOLESTEROL, DIRECT: LDL DIRECT: 205 mg/dL

## 2016-01-24 MED ORDER — NORELGESTROMIN-ETH ESTRADIOL 150-35 MCG/24HR TD PTWK: MEDICATED_PATCH | TRANSDERMAL | Status: DC

## 2016-01-24 MED ORDER — ALBUTEROL SULFATE HFA 108 (90 BASE) MCG/ACT IN AERS: INHALATION_SPRAY | RESPIRATORY_TRACT | Status: DC

## 2016-01-24 MED ORDER — METHOCARBAMOL 500 MG PO TABS: 500.0000 mg | ORAL_TABLET | Freq: Three times a day (TID) | ORAL | Status: DC | PRN

## 2016-01-24 MED ORDER — IRBESARTAN 75 MG PO TABS: ORAL_TABLET | ORAL | Status: DC

## 2016-01-24 NOTE — Assessment & Plan Note (Signed)
Reviewed and updated patient's medical, surgical, family and social history. Medications and allergies were also reviewed. Basic screenings for depression, activities of daily living, hearing, cognition and safety were performed. Provider list was updated and health plan was provided to the patient.   1) Anticipatory Guidance: Discussed importance of wearing a seatbelt while driving and not texting while driving; changing batteries in smoke detector at least once annually; wearing suntan lotion when outside; eating a balanced and moderate diet; getting physical activity at least 30 minutes per day.  2) Immunizations / Screenings / Labs:  Declines Pneumovax. All other immunizations are up to date per recommendations. Due for a PAP with appointment scheduled with gynecology. Due for a dental exam encouraged to be completed independently. All other screenings are up to date per recommendations. Obtain CBC, CMET, Lipid profile and TSH.   Overall well exam with risk factors for cardiovascular disease including hypertension, hyperlipidemia, obesity, and type 2 diabetes. Chronic conditions appear adequate controlled current medication regimens. Follow-up for chronic conditions pending blood work. Recommend weight loss approximate 5-10% of current body weight through lifestyle changes.Recommend increasing physical activity to 30 minutes of moderate level activity daily. Encourage nutritional intake that focuses on nutrient dense foods and is moderate, varied, and balanced and is low in saturated fats and processed/sugary foods. Continue other healthy lifestyle behaviors and choices. Follow-up prevention exam in 1 year. Follow-up office visit pending blood work.

## 2016-01-24 NOTE — Progress Notes (Signed)
Pre visit review using our clinic review tool, if applicable. No additional management support is needed unless otherwise documented below in the visit note. 

## 2016-01-24 NOTE — Patient Instructions (Addendum)
Thank you for choosing [facility].  Summary/Instructions:  Your prescription(s) have been submitted to your pharmacy or been printed and provided for you. Please take as directed and contact our office if you believe you are having problem(s) with the medication(s) or have any questions.  Please stop by the lab on the basement level of the building for your blood work. Your results will be released to [place] (or called to you) after review, usually within 72 hours after test completion. If any changes need to be made, you will be notified at that same time.   Health Maintenance  Topic Date Due  . FOOT EXAM  12/12/1977  . HIV Screening  12/13/1982  . PAP SMEAR  08/08/2015  . PNEUMOCOCCAL POLYSACCHARIDE VACCINE (1) 06/01/2016 (Originally 12/12/1969)  . MAMMOGRAM  06/01/2016 (Originally 08/07/2013)  . INFLUENZA VACCINE  04/17/2016  . HEMOGLOBIN A1C  06/23/2016  . OPHTHALMOLOGY EXAM  12/27/2016  . TETANUS/TDAP  02/16/2019    Health Maintenance, Female Adopting a healthy lifestyle and getting preventive care can go a long way to promote health and wellness. Talk with your health care provider about what schedule of regular examinations is right for you. This is a good chance for you to check in with your provider about disease prevention and staying healthy. In between checkups, there are plenty of things you can do on your own. Experts have done a lot of research about which lifestyle changes and preventive measures are most likely to keep you healthy. Ask your health care provider for more information. WEIGHT AND DIET  Eat a healthy diet  Be sure to include plenty of vegetables, fruits, low-fat dairy products, and lean protein.  Do not eat a lot of foods high in solid fats, added sugars, or salt.  Get regular exercise. This is one of the most important things you can do for your health.  Most adults should exercise for at least 150 minutes each week. The exercise should  increase your heart rate and make you sweat (moderate-intensity exercise).  Most adults should also do strengthening exercises at least twice a week. This is in addition to the moderate-intensity exercise.  Maintain a healthy weight  Body mass index (BMI) is a measurement that can be used to identify possible weight problems. It estimates body fat based on height and weight. Your health care provider can help determine your BMI and help you achieve or maintain a healthy weight.  For females 48 years of age and older:   A BMI below 18.5 is considered underweight.  A BMI of 18.5 to 24.9 is normal.  A BMI of 25 to 29.9 is considered overweight.  A BMI of 30 and above is considered obese.  Watch levels of cholesterol and blood lipids  You should start having your blood tested for lipids and cholesterol at 48 years of age, then have this test every 5 years.  You may need to have your cholesterol levels checked more often if:  Your lipid or cholesterol levels are high.  You are older than 48 years of age.  You are at high risk for heart disease.  CANCER SCREENING   Lung Cancer  Lung cancer screening is recommended for adults 46-105 years old who are at high risk for lung cancer because of a history of smoking.  A yearly low-dose CT scan of the lungs is recommended for people who:  Currently smoke.  Have quit within the past 15 years.  Have at least a 30-pack-year history  of smoking. A pack year is smoking an average of one pack of cigarettes a day for 1 year.  Yearly screening should continue until it has been 15 years since you quit.  Yearly screening should stop if you develop a health problem that would prevent you from having lung cancer treatment.  Breast Cancer  Practice breast self-awareness. This means understanding how your breasts normally appear and feel.  It also means doing regular breast self-exams. Let your health care provider know about any changes, no  matter how small.  If you are in your 20s or 30s, you should have a clinical breast exam (CBE) by a health care provider every 1-3 years as part of a regular health exam.  If you are 61 or older, have a CBE every year. Also consider having a breast X-ray (mammogram) every year.  If you have a family history of breast cancer, talk to your health care provider about genetic screening.  If you are at high risk for breast cancer, talk to your health care provider about having an MRI and a mammogram every year.  Breast cancer gene (BRCA) assessment is recommended for women who have family members with BRCA-related cancers. BRCA-related cancers include:  Breast.  Ovarian.  Tubal.  Peritoneal cancers.  Results of the assessment will determine the need for genetic counseling and BRCA1 and BRCA2 testing. Cervical Cancer Your health care provider may recommend that you be screened regularly for cancer of the pelvic organs (ovaries, uterus, and vagina). This screening involves a pelvic examination, including checking for microscopic changes to the surface of your cervix (Pap test). You may be encouraged to have this screening done every 3 years, beginning at age 52.  For women ages 57-65, health care providers may recommend pelvic exams and Pap testing every 3 years, or they may recommend the Pap and pelvic exam, combined with testing for human papilloma virus (HPV), every 5 years. Some types of HPV increase your risk of cervical cancer. Testing for HPV may also be done on women of any age with unclear Pap test results.  Other health care providers may not recommend any screening for nonpregnant women who are considered low risk for pelvic cancer and who do not have symptoms. Ask your health care provider if a screening pelvic exam is right for you.  If you have had past treatment for cervical cancer or a condition that could lead to cancer, you need Pap tests and screening for cancer for at least  20 years after your treatment. If Pap tests have been discontinued, your risk factors (such as having a new sexual partner) need to be reassessed to determine if screening should resume. Some women have medical problems that increase the chance of getting cervical cancer. In these cases, your health care provider may recommend more frequent screening and Pap tests. Colorectal Cancer  This type of cancer can be detected and often prevented.  Routine colorectal cancer screening usually begins at 48 years of age and continues through 48 years of age.  Your health care provider may recommend screening at an earlier age if you have risk factors for colon cancer.  Your health care provider may also recommend using home test kits to check for hidden blood in the stool.  A small camera at the end of a tube can be used to examine your colon directly (sigmoidoscopy or colonoscopy). This is done to check for the earliest forms of colorectal cancer.  Routine screening usually begins at age  50.  Direct examination of the colon should be repeated every 5-10 years through 48 years of age. However, you may need to be screened more often if early forms of precancerous polyps or small growths are found. Skin Cancer  Check your skin from head to toe regularly.  Tell your health care provider about any new moles or changes in moles, especially if there is a change in a mole's shape or color.  Also tell your health care provider if you have a mole that is larger than the size of a pencil eraser.  Always use sunscreen. Apply sunscreen liberally and repeatedly throughout the day.  Protect yourself by wearing long sleeves, pants, a wide-brimmed hat, and sunglasses whenever you are outside. HEART DISEASE, DIABETES, AND HIGH BLOOD PRESSURE   High blood pressure causes heart disease and increases the risk of stroke. High blood pressure is more likely to develop in:  People who have blood pressure in the high end  of the normal range (130-139/85-89 mm Hg).  People who are overweight or obese.  People who are African American.  If you are 39-17 years of age, have your blood pressure checked every 3-5 years. If you are 49 years of age or older, have your blood pressure checked every year. You should have your blood pressure measured twice--once when you are at a hospital or clinic, and once when you are not at a hospital or clinic. Record the average of the two measurements. To check your blood pressure when you are not at a hospital or clinic, you can use:  An automated blood pressure machine at a pharmacy.  A home blood pressure monitor.  If you are between 58 years and 39 years old, ask your health care provider if you should take aspirin to prevent strokes.  Have regular diabetes screenings. This involves taking a blood sample to check your fasting blood sugar level.  If you are at a normal weight and have a low risk for diabetes, have this test once every three years after 48 years of age.  If you are overweight and have a high risk for diabetes, consider being tested at a younger age or more often. PREVENTING INFECTION  Hepatitis B  If you have a higher risk for hepatitis B, you should be screened for this virus. You are considered at high risk for hepatitis B if:  You were born in a country where hepatitis B is common. Ask your health care provider which countries are considered high risk.  Your parents were born in a high-risk country, and you have not been immunized against hepatitis B (hepatitis B vaccine).  You have HIV or AIDS.  You use needles to inject street drugs.  You live with someone who has hepatitis B.  You have had sex with someone who has hepatitis B.  You get hemodialysis treatment.  You take certain medicines for conditions, including cancer, organ transplantation, and autoimmune conditions. Hepatitis C  Blood testing is recommended for:  Everyone born from  [birth year] through 1963.  Anyone with known risk factors for hepatitis C. Sexually transmitted infections (STIs)  You should be screened for sexually transmitted infections (STIs) including gonorrhea and chlamydia if:  You are sexually active and are younger than 48 years of age.  You are older than 48 years of age and your health care provider tells you that you are at risk for this type of infection.  Your sexual activity has changed since you were last screened and  you are at an increased risk for chlamydia or gonorrhea. Ask your health care provider if you are at risk.  If you do not have HIV, but are at risk, it may be recommended that you take a prescription medicine daily to prevent HIV infection. This is called pre-exposure prophylaxis (PrEP). You are considered at risk if:  You are sexually active and do not regularly use condoms or know the HIV status of your partner(s).  You take drugs by injection.  You are sexually active with a partner who has HIV. Talk with your health care provider about whether you are at high risk of being infected with HIV. If you choose to begin PrEP, you should first be tested for HIV. You should then be tested every 3 months for as long as you are taking PrEP.  PREGNANCY   If you are premenopausal and you may become pregnant, ask your health care provider about preconception counseling.  If you may become pregnant, take 400 to 800 micrograms (mcg) of folic acid every day.  If you want to prevent pregnancy, talk to your health care provider about birth control (contraception). OSTEOPOROSIS AND MENOPAUSE   Osteoporosis is a disease in which the bones lose minerals and strength with aging. This can result in serious bone fractures. Your risk for osteoporosis can be identified using a bone density scan.  If you are 74 years of age or older, or if you are at risk for osteoporosis and fractures, ask your health care provider if you should be screened.  Ask  your health care provider whether you should take a calcium or vitamin D supplement to lower your risk for osteoporosis.  Menopause may have certain physical symptoms and risks.  Hormone replacement therapy may reduce some of these symptoms and risks. Talk to your health care provider about whether hormone replacement therapy is right for you.  HOME CARE INSTRUCTIONS   Schedule regular health, dental, and eye exams.  Stay current with your immunizations.   Do not use any tobacco products including cigarettes, chewing tobacco, or electronic cigarettes.  If you are pregnant, do not drink alcohol.  If you are breastfeeding, limit how much and how often you drink alcohol.  Limit alcohol intake to no more than 1 drink per day for nonpregnant women. One drink equals 12 ounces of beer, 5 ounces of wine, or 1 ounces of hard liquor.  Do not use street drugs.  Do not share needles.  Ask your health care provider for help if you need support or information about quitting drugs.  Tell your health care provider if you often feel depressed.  Tell your health care provider if you have ever been abused or do not feel safe at home.   This information is not intended to replace advice given to you by your health care provider. Make sure you discuss any questions you have with your health care provider.   Document Released: 03/19/2011 Document Revised: 09/24/2014 Document Reviewed: 08/05/2013 Elsevier Interactive Patient Education 2016 [employer] Directive Advance directives are the legal documents that allow you to make choices about your health care and medical treatment if you cannot speak for yourself. Advance directives are a way for you to communicate your wishes to family, friends, and health care providers. The specified people can then convey your decisions about end-of-life care to avoid confusion if you should become unable to communicate. Ideally, the process of  discussing and writing advance directives should happen over time  rather than making decisions all at once. Advance directives can be modified as your situation changes, and you can change your mind at any time, even after you have signed the advance directives. Each state has its own laws regarding advance directives. You may want to check with your health care provider, attorney, or state representative about the law in your state. Below are some examples of advance directives. LIVING WILL A living will is a set of instructions documenting your wishes about medical care when you cannot care for yourself. It is used if you become:  Terminally ill.  Incapacitated.  Unable to communicate.  Unable to make decisions. Items to consider in your living will include:  The use or non-use of life-sustaining equipment, such as dialysis machines and breathing machines (ventilators).  A do not resuscitate (DNR) order, which is the instruction not to use cardiopulmonary resuscitation (CPR) if breathing or heartbeat stops.  Tube feeding.  Withholding of food and fluids.  Comfort (palliative) care when the goal becomes comfort rather than a cure.  Organ and tissue donation. A living will does not give instructions about distribution of your money and property if you should pass away. It is advisable to seek the expert advice of a lawyer in drawing up a will regarding your possessions. Decisions about taxes, beneficiaries, and asset distribution will be legally binding. This process can relieve your family and friends of any burdens surrounding disputes or questions that may come up about the allocation of your assets. DO NOT RESUSCITATE (DNR) A do not resuscitate (DNR) order is a request to not have CPR in the event that your heart stops beating or you stop breathing. Unless given other instructions, a health care provider will try to help any patient whose heart has stopped or who has stopped breathing.   HEALTH CARE PROXY AND DURABLE POWER OF ATTORNEY FOR HEALTH CARE A health care proxy is a person (agent) appointed to make medical decisions for you if you cannot. Generally, people choose someone they know well and trust to represent their preferences when they can no longer do so. You should be sure to ask this person for agreement to act as your agent. An agent may have to exercise judgment in the event of a medical decision for which your wishes are not known. The durable power of attorney for health care is the legal document that names your health care proxy. Once written, it should be:  Signed.  Notarized.  Dated.  Copied.  Witnessed.  Incorporated into your medical record. You may also want to appoint someone to manage your financial affairs if you cannot. This is called a durable power of attorney for finances. It is a separate legal document from the durable power of attorney for health care. You may choose the same person or someone different from your health care proxy to act as your agent in financial matters.   This information is not intended to replace advice given to you by your health care provider. Make sure you discuss any questions you have with your health care provider.   Document Released: 12/11/2007 Document Revised: 09/08/2013 Document Reviewed: 01/21/2013 Elsevier Interactive Patient Education [employer].

## 2016-01-24 NOTE — Progress Notes (Signed)
Subjective:    Patient ID: Abigail CoveSara E Breeding, female    DOB: May 03, 1966, 50 y.o.   MRN: [id number]  Chief Complaint  Patient presents with  . CPE    fasting, has a pap smear set up in june    HPI:  [name] is a 50 y.o. female who presents today for an annual wellness visit.   1) Health Maintenance -   Diet - Averages about 4 meals per day consisting of [place], beef, chicken, fruits, and vegetables. No caffeine intake  Exercise - 2x per day walking the dogs.   2) Preventative Exams / Immunizations:  Dental -- Due for exam   Vision -- Up to date   Health Maintenance  Topic Date Due  . FOOT EXAM  12/12/1977  . HIV Screening  12/13/1982  . PAP SMEAR  08/08/2015  . PNEUMOCOCCAL POLYSACCHARIDE VACCINE (1) 06/01/2016 (Originally 12/12/1969)  . MAMMOGRAM  06/01/2016 (Originally 08/07/2013)  . INFLUENZA VACCINE  04/17/2016  . HEMOGLOBIN A1C  06/23/2016  . OPHTHALMOLOGY EXAM  12/27/2016  . TETANUS/TDAP  02/16/2019  Will check on tetanus   Immunization History  Administered Date(s) Administered  . Influenza,inj,Quad PF,36+ Mos 07/10/2013, 07/02/2014    RISK FACTORS  Tobacco History  Smoking status  . Never Smoker   Smokeless tobacco  . Never Used     Cardiac risk factors: diabetes mellitus, dyslipidemia, hypertension and obesity (BMI >= 30 kg/m2).  Depression Screen  Q1: Over the past two weeks, have you felt down, depressed or hopeless? No  Q2: Over the past two weeks, have you felt little interest or pleasure in doing things? No  Have you lost interest or pleasure in daily life? No  Do you often feel hopeless? No  Do you cry easily over simple problems? No  Currently being treated by psychiatry and well controlled with medication.   Activities of Daily Living In your present state of health, do you have any difficulty performing the following activities?:  Driving? No Managing money?  No Feeding yourself? No Getting from bed to chair? No Climbing  a flight of stairs? No Preparing food and eating?: No Bathing or showering? No Getting dressed: No Getting to the toilet? No Using the toilet: No Moving around from place to place: No In the past year have you fallen or had a near fall?:No   Home Safety Has smoke detector and wears seat belts. No firearms. No excess sun exposure. Are there smokers in your home (other than you)?  No Do you feel safe at home?  Yes   Hearing Difficulties: No Do you often ask people to speak up or repeat themselves? No Do you experience ringing or noises in your ears? No  Do you have difficulty understanding soft or whispered voices? No    Cognitive Testing  Alert? Yes   Normal Appearance? Yes  Oriented to person? Yes  Place? Yes   Time? Yes  Recall of three objects?  Yes  Can perform simple calculations? Yes  Displays appropriate judgment? Yes  Can read the correct time from a watch face? Yes  Do you feel that you have a problem with memory? No  Do you often misplace items? No   Advanced Directives have been discussed with the patient? Yes   Current Physicians/Providers and Suppliers  1. Marcos EkeGreg Calone, FNP - Internal Medicine 2. Thresa RossNadeem Akhtar, MD - Psychiatry 3. Carlus Pavlovristina Gherghe, MD - Endocrinology 4. Jetty Duhamellinton Young, MD - Pulmonology  Indicate any recent Medical Services  you may have received from other than [facility] providers in the past year (date may be approximate).  All answers were reviewed with the patient and necessary referrals were made:  Jeanine Luz, FNP   01/24/2016    Allergies  Allergen Reactions  . Latex Itching and Swelling  . Penicillins Other (See Comments)    dizzy     Outpatient Prescriptions Prior to Visit  Medication Sig Dispense Refill  . atenolol (TENORMIN) 50 MG tablet TAKE 2 TABLETS BY MOUTH EVERY MORNING AND EVERY EVENING (Patient taking differently: TAKE 1 TABLET BY MOUTH EVERY MORNING AND EVERY EVENING) 120 tablet 5  . Bromocriptine Mesylate (CYCLOSET) 0.8  MG TABS TAKE 3 TABLETS BY MOUTH DAILY IN THE MORNING 270 tablet 1  . CYCLOSET 0.8 MG TABS TAKE 3 TABLETS BY MOUTH DAILY IN THE MORNING 90 tablet 3  . fexofenadine (ALLEGRA) 180 MG tablet Take 1 tablet (180 mg total) by mouth daily. 30 tablet prn  . glipiZIDE (GLUCOTROL) 5 MG tablet TAKE ONE TABLET BY MOUTH 2x a day 180 tablet 2  . naphazoline-pheniramine (VISINE-A) 0.025-0.3 % ophthalmic solution Place 1 drop into both eyes 2 (two) times daily.    . naproxen (NAPROSYN) 500 MG tablet Take 1 tablet (500 mg total) by mouth 2 (two) times daily with a meal. 30 tablet 1  . ONE TOUCH ULTRA TEST test strip USE AS DIRECTED FOUR TIMES DAILY 125 each 2  . OVER THE COUNTER MEDICATION 1 capsule daily. Probiotic renew life    . sertraline (ZOLOFT) 100 MG tablet Take 2 tablets (200 mg total) by mouth daily. 180 tablet 1  . albuterol (PROVENTIL HFA;VENTOLIN HFA) 108 (90 BASE) MCG/ACT inhaler 2 puffs every 4-6 hours as directed rescue inhaler 3 Inhaler 3  . irbesartan (AVAPRO) 75 MG tablet TAKE 1 TABLET(75 MG) BY MOUTH DAILY 90 tablet 0  . methocarbamol (ROBAXIN) 500 MG tablet Take 1 tablet (500 mg total) by mouth every 8 (eight) hours as needed for muscle spasms. 30 tablet 0  . XULANE 150-35 MCG/24HR transdermal patch      No facility-administered medications prior to visit.     Past Medical History  Diagnosis Date  . Depression   . Anxiety   . Asthma   . Diabetes mellitus type II   . Insomnia   . PTSD (post-traumatic stress disorder)   . High blood pressure   . PCOS (polycystic ovarian syndrome)   . Thyroid disease   . IBS (irritable bowel syndrome)   . Food intolerance (HCC)   . Seasonal allergies   . Headache   . Migraines      Past Surgical History  Procedure Laterality Date  . Cholecystectomy    . Gallbladder surgery       Family History  Problem Relation Age of Onset  . Bipolar disorder Mother   . Hypertension Mother   . Diabetes Mother   . Alzheimer's disease Father   .  Hypertension Father   . Alzheimer's disease Maternal Grandmother   . Heart disease Paternal Grandfather      Social History   Social History  . Marital Status: Single    Spouse Name: N/A  . Number of Children: 0  . Years of Education: 18   Occupational History  . Long Term Disability     PTSD   Social History Main Topics  . Smoking status: Never Smoker   . Smokeless tobacco: Never Used  . Alcohol Use: 0.0 oz/week    0.5 Standard  drinks or equivalent per week     Comment: 1-2 drinks a month  . Drug Use: No  . Sexual Activity: Not Currently    Birth Control/ Protection: Pill   Other Topics Concern  . Not on file   Social History Narrative   Regular exercise: yes, walk   Caffeine use: occasionally tea   Fun: Social group events, walking her dogs, movies   Denies abuse and feels safe where she lives.         Review of Systems  Constitutional: Denies fever, chills, fatigue, or significant weight gain/loss. HENT: Head: Denies headache or neck pain Ears: Denies changes in hearing, ringing in ears, earache, drainage Nose: Denies discharge, stuffiness, itching, nosebleed, sinus pain Throat: Denies sore throat, hoarseness, dry mouth, sores, thrush Eyes: Denies loss/changes in vision, pain, redness, blurry/double vision, flashing lights Cardiovascular: Denies chest pain/discomfort, tightness, palpitations, shortness of breath with activity, difficulty lying down, swelling, sudden awakening with shortness of breath Respiratory: Denies shortness of breath, cough, sputum production, wheezing Gastrointestinal: Denies dysphasia, heartburn, change in appetite, nausea, change in bowel habits, rectal bleeding, constipation, diarrhea, yellow skin or eyes Genitourinary: Denies frequency, urgency, burning/pain, blood in urine, incontinence, change in urinary strength. Musculoskeletal: Denies muscle/joint pain, stiffness, back pain, redness or swelling of joints, trauma Skin: Denies  rashes, lumps, itching, dryness, color changes, or hair/nail changes Neurological: Denies dizziness, fainting, seizures, weakness, numbness, tingling, tremor Psychiatric - Denies nervousness, stress, depression or memory loss Endocrine: Denies heat or cold intolerance, sweating, frequent urination, excessive thirst, changes in appetite Hematologic: Denies ease of bruising or bleeding    Objective:     BP 102/68 mmHg  Pulse 63  Temp(Src) 98.3 F (36.8 C) (Oral)  Resp 14  Ht  (1.549 m)  Wt 167 lb (75.751 kg)  BMI 31.57 kg/m2  SpO2 95% Nursing note and vital signs reviewed.  Physical Exam  Constitutional: She is oriented to person, place, and time. She appears well-developed and well-nourished.  HENT:  Head: Normocephalic.  Right Ear: Hearing, tympanic membrane, external ear and ear canal normal.  Left Ear: Hearing, tympanic membrane, external ear and ear canal normal.  Nose: Nose normal.  Mouth/Throat: Uvula is midline, oropharynx is clear and moist and mucous membranes are normal.  Eyes: Conjunctivae and EOM are normal. Pupils are equal, round, and reactive to light.  Neck: Neck supple. No JVD present. No tracheal deviation present. No thyromegaly present.  Cardiovascular: Normal rate, regular rhythm, normal heart sounds and intact distal pulses.   Pulmonary/Chest: Effort normal and breath sounds normal.  Abdominal: Soft. Bowel sounds are normal. She exhibits no distension and no mass. There is no tenderness. There is no rebound and no guarding.  Musculoskeletal: Normal range of motion. She exhibits no edema or tenderness.  Lymphadenopathy:    She has no cervical adenopathy.  Neurological: She is alert and oriented to person, place, and time. She has normal reflexes. No cranial nerve deficit. She exhibits normal muscle tone. Coordination normal.  Skin: Skin is warm and dry.  Psychiatric: She has a normal mood and affect. Her behavior is normal. Judgment and thought content  normal.       Assessment & Plan:   During the course of the visit the patient was educated and counseled about appropriate screening and preventive services including:    Pneumococcal vaccine   Influenza vaccine  Td vaccine  Diabetes screening  Diet review for nutrition referral? Yes ____  Not Indicated _X___   Patient Instructions (the  written plan) was given to the patient.  Medicare Attestation I have personally reviewed: The patient's medical and social history Their use of alcohol, tobacco or illicit drugs Their current medications and supplements The patient's functional ability including ADLs,fall risks, home safety risks, cognitive, and hearing and visual impairment Diet and physical activities Evidence for depression or mood disorders  The patient's weight, height, BMI,  have been recorded in the chart.  I have made referrals, counseling, and provided education to the patient based on review of the above and I have provided the patient with a written personalized care plan for preventive services.     Jeanine Luz, FNP   01/24/2016    Problem List Items Addressed This Visit      Cardiovascular and Mediastinum   Essential hypertension   Relevant Medications   irbesartan (AVAPRO) 75 MG tablet   Other Relevant Orders   Comprehensive metabolic panel   CBC     Respiratory   Asthma, mild persistent   Relevant Medications   albuterol (PROVENTIL HFA;VENTOLIN HFA) 108 (90 Base) MCG/ACT inhaler     Endocrine   Hypothyroidism   Relevant Orders   TSH   Comprehensive metabolic panel   CBC     Musculoskeletal and Integument   Muscle spasms of neck   Relevant Medications   methocarbamol (ROBAXIN) 500 MG tablet     Other   Hyperlipidemia   Relevant Medications   irbesartan (AVAPRO) 75 MG tablet   Other Relevant Orders   Lipid panel   Comprehensive metabolic panel   CBC   Medicare annual wellness visit, subsequent - Primary    Reviewed and updated  patient's medical, surgical, family and social history. Medications and allergies were also reviewed. Basic screenings for depression, activities of daily living, hearing, cognition and safety were performed. Provider list was updated and health plan was provided to the patient.   1) Anticipatory Guidance: Discussed importance of wearing a seatbelt while driving and not texting while driving; changing batteries in smoke detector at least once annually; wearing suntan lotion when outside; eating a balanced and moderate diet; getting physical activity at least 30 minutes per day.  2) Immunizations / Screenings / Labs:  Declines Pneumovax. All other immunizations are up to date per recommendations. Due for a PAP with appointment scheduled with gynecology. Due for a dental exam encouraged to be completed independently. All other screenings are up to date per recommendations. Obtain CBC, CMET, Lipid profile and TSH.   Overall well exam with risk factors for cardiovascular disease including hypertension, hyperlipidemia, obesity, and type 2 diabetes. Chronic conditions appear adequate controlled current medication regimens. Follow-up for chronic conditions pending blood work. Recommend weight loss approximate 5-10% of current body weight through lifestyle changes.Recommend increasing physical activity to 30 minutes of moderate level activity daily. Encourage nutritional intake that focuses on nutrient dense foods and is moderate, varied, and balanced and is low in saturated fats and processed/sugary foods. Continue other healthy lifestyle behaviors and choices. Follow-up prevention exam in 1 year. Follow-up office visit pending blood work.

## 2016-02-22 ENCOUNTER — Ambulatory Visit (INDEPENDENT_AMBULATORY_CARE_PROVIDER_SITE_OTHER): Payer: Medicare Other | Admitting: Gynecology

## 2016-02-22 ENCOUNTER — Encounter: Payer: Self-pay | Admitting: Gynecology

## 2016-02-22 VITALS — BP 126/82 | Ht 61.0 in | Wt 169.0 lb

## 2016-02-22 DIAGNOSIS — E282 Polycystic ovarian syndrome: Secondary | ICD-10-CM | POA: Diagnosis not present

## 2016-02-22 DIAGNOSIS — Z01419 Encounter for gynecological examination (general) (routine) without abnormal findings: Secondary | ICD-10-CM

## 2016-02-22 DIAGNOSIS — R102 Pelvic and perineal pain: Secondary | ICD-10-CM | POA: Diagnosis not present

## 2016-02-22 DIAGNOSIS — N915 Oligomenorrhea, unspecified: Secondary | ICD-10-CM

## 2016-02-22 DIAGNOSIS — N942 Vaginismus: Secondary | ICD-10-CM | POA: Diagnosis not present

## 2016-02-22 MED ORDER — MEDROXYPROGESTERONE ACETATE 10 MG PO TABS
ORAL_TABLET | ORAL | Status: DC
Start: 2016-02-22 — End: 2019-04-21
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
## 2016-02-29 ENCOUNTER — Encounter: Payer: Self-pay | Admitting: Gynecology

## 2016-02-29 ENCOUNTER — Ambulatory Visit (INDEPENDENT_AMBULATORY_CARE_PROVIDER_SITE_OTHER): Payer: Medicare Other | Admitting: Gynecology

## 2016-02-29 ENCOUNTER — Ambulatory Visit (INDEPENDENT_AMBULATORY_CARE_PROVIDER_SITE_OTHER): Payer: Medicare Other

## 2016-02-29 VITALS — BP 102/68

## 2016-02-29 DIAGNOSIS — Z8742 Personal history of other diseases of the female genital tract: Secondary | ICD-10-CM | POA: Diagnosis not present

## 2016-02-29 DIAGNOSIS — N942 Vaginismus: Secondary | ICD-10-CM

## 2016-02-29 DIAGNOSIS — N915 Oligomenorrhea, unspecified: Secondary | ICD-10-CM | POA: Diagnosis not present

## 2016-02-29 DIAGNOSIS — R102 Pelvic and perineal pain: Secondary | ICD-10-CM

## 2016-02-29 NOTE — Progress Notes (Signed)
   HPI: Is a 50 year old was seen the office next on June 7 for the first time as a new patient to the practice see previous note for detail. Patient with known past history of PCOS and has suffer from oligomenorrhea and not sexually active. She is here for an ultrasound to the fact that she suffers from vaginismus and a thorough pelvic exam was not possible. She had been prescribed Provera 10 mg to take for 10 days each month if she did not have a spontaneous menses every 30 days. She was asymptomatic today.   ROS: A ROS was performed and pertinent positives and negatives are included in the history.  GENERAL: No fevers or chills. HEENT: No change in vision, no earache, sore throat or sinus congestion. NECK: No pain or stiffness. CARDIOVASCULAR: No chest pain or pressure. No palpitations. PULMONARY: No shortness of breath, cough or wheeze. GASTROINTESTINAL: No abdominal pain, nausea, vomiting or diarrhea, melena or bright red blood per rectum. GENITOURINARY: No urinary frequency, urgency, hesitancy or dysuria. MUSCULOSKELETAL: No joint or muscle pain, no back pain, no recent trauma. DERMATOLOGIC: No rash, no itching, no lesions. ENDOCRINE: No polyuria, polydipsia, no heat or cold intolerance. No recent change in weight. HEMATOLOGICAL: No anemia or easy bruising or bleeding. NEUROLOGIC: No headache, seizures, numbness, tingling or weakness. PSYCHIATRIC: No depression, no loss of interest in normal activity or change in sleep pattern.   PE: See previous note dated 02/22/2016 for details  Ultrasound today: Uterus measures 7.7 x 4.0 x 3.0 cm with endometrial stripe of 1.3 mm. Right and left ovary were normal. No apparent adnexal masses    Assessment Plan: #1 vaginismus #2 history PCOS #3 oligomenorrhea attributed #2 above. Patient was provided with prescription for Provera 10 mg to take 1 by mouth daily for 10 days of each month if she did not have a spontaneous menses every 30-35 days. If she were to  become sexually active it would be recommended she use some form of barrier contraception also check a home urine pregnancy test before she takes the Provera. Patient otherwise scheduled to return back for her annual exam next year or when necessary. She was reminded to schedule her mammogram. Normal pelvic ultrasound today.     Greater than 50% of time was spent in counseling and coordinating care of this patient.   Time of consultation: 10 minutes

## 2016-03-15 ENCOUNTER — Encounter: Payer: Self-pay | Admitting: Internal Medicine

## 2016-03-15 ENCOUNTER — Ambulatory Visit (INDEPENDENT_AMBULATORY_CARE_PROVIDER_SITE_OTHER): Payer: Medicare Other | Admitting: Internal Medicine

## 2016-03-15 VITALS — BP 106/64 | HR 69 | Ht 61.0 in | Wt 173.6 lb

## 2016-03-15 DIAGNOSIS — J3089 Other allergic rhinitis: Secondary | ICD-10-CM

## 2016-03-15 DIAGNOSIS — J4530 Mild persistent asthma, uncomplicated: Secondary | ICD-10-CM

## 2016-03-15 MED ORDER — ZAFIRLUKAST 20 MG PO TABS: 20.0000 mg | ORAL_TABLET | Freq: Two times a day (BID) | ORAL | Status: DC

## 2016-03-15 NOTE — Progress Notes (Signed)
11/06/13- 47 yoF never smoker self-referred for allergy.  She has had perennial allergic rhinitis since childhood, improved during her teens but worse again since age 50. Triggers have included pollens, house dust, dry or humid air, strong odors and irritants. [facility] [place] and again in [place] around 2000 and. She members positive reactions to tree pollens, grass pollens, ragweed. She was on allergy shots for 2 years and thinks that helped her, but she had large local reactions. She has tried to minimize use of Flonase, but says if she skips it, symptoms flare. She prefers Careers adviserAllegra as an antihistamine. Singulair caused bad dreams. Multiple food intolerances-peanuts, citrus, dairy, weak/gluten-all causing irritable bowel symptoms. She says celiac disease was ruled out. Intolerance to latex but not contrast dye or aspirin. Treated with prednisone 17 years ago for urticaria. Never stung by insect. 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 Lives in a townhouse with air filters and and encasings on bedding. CAC. 2 dogs, 2 cats, no mold. Brother had anaphylactic reaction from yellow jacket sting. Both parents have "allergies".  02/04/14- 48 yoF never smoker followed for allergic rhinitis, allergic conjunctivitis, food intolerance/ irritable bowel, complicated by DM, HBP, Hx depression FOLLOWS FOR: using OTC eye drops for allergies due to increased pollen; itchy and watery eyes., She prefers brand-name Allegra over fexofenadine generic. Using Flonase. She intends to keep her cats and dog. She thinks her eyes are doing quite well now Allergy profile 11/06/2013-total IgE 651.9, broadly positive especially for cat  03/16/15-48 yoF never smoker followed for allergic rhinitis, allergic conjunctivitis, food intolerance/ irritable bowel, complicated by DM, HBP, Hx depression FOLLOWS FOR: Allergies have not changed since her last OV. Doing fairly well. Sometimes eyes itch,  nose runs. Uses rescue inhaler once or twice a week for chest tightness. Asthma does not wake her. Flonase for rhinitis, one puff each nostril. 2 puffs will cause nosebleed. Office spirometry 03/15/2015-within normal limits. FVC 2.47/82%, FEV1 1.93/77%, FEV1/FVC 78%, FEF 25-75 percent 1.95/67%.  03/15/2016-50 year old female never smoker followed for allergic rhinitis, allergic conjunctivitis, food intolerance/irritable bowel, complicated by DM, HBP, depression FOLLOW FOR: doing okay, some trouble with weather change.  no specific concerns.  Flonase causes nose bleeds. Nasalcrom insufficient. Singulair caused nightmares. Rare need for rescue inhaler, once or twice a week. Denies using Afrin  ROS-see HPI Constitutional:   No-   weight loss, night sweats, fevers, chills, fatigue, lassitude. HEENT:   +  headaches, +difficulty swallowing, tooth/dental problems, sore throat,       No-  +sneezing,+ itching, no-ear ache,+nasal congestion, post nasal drip,  CV:  No-   chest pain, orthopnea, PND, swelling in lower extremities, anasarca,                                                       dizziness, palpitations Resp: +shortness of breath with exertion or at rest.              No-   productive cough,  No non-productive cough,  No- coughing up of blood.              No-   change in color of mucus.  + wheezing.   Skin: No-   rash or lesions. GI:  +heartburn, indigestion, +abdominal pain, nausea, vomiting,  GU:  MS: + joint pain or  swelling.   Neuro-     nothing unusual Psych:  No- change in mood or affect. No depression or anxiety.  No memory loss.  OBJ- Physical Exam General- Alert, Oriented, Affect-appropriate, Distress- none acute Skin- rash-none, lesions- none, excoriation- none Lymphadenopathy- none Head- atraumatic            Eyes- Gross vision intact, PERRLA, conjunctivae and secretions clear            Ears- Hearing, canals-normal            Nose- + sniffing, mucosa looks red, no-Septal  dev, +mucus bridging, polyps, erosion, perforation             Throat- Mallampati II , mucosa clear , drainage- none, tonsils- atrophic Neck- flexible , trachea midline, no stridor , thyroid nl, carotid no bruit Chest - symmetrical excursion , unlabored           Heart/CV- RRR , +1/6 upper sternal murmur , no gallop  , no rub, nl s1 s2                           - JVD- none , edema- none, stasis changes- none, varices- none           Lung- clear to P&A, wheeze- none, cough- none , dullness-none, rub- none           Chest wall-  Abd-  Br/ Gen/ Rectal- Not done, not indicated Extrem- cyanosis- none, clubbing, none, atrophy- none, strength- nl Neuro- grossly intact to observation

## 2016-03-15 NOTE — Patient Instructions (Signed)
Script sent to try Accolate twice daily before meals. See if it helps your nose and reduces asthma  Sample Trial Dymista nasal spray     1-2 puffs once daily at bedtime  You could also try again with Nasalcrom/ cromolyn/ cromol

## 2016-03-18 NOTE — Assessment & Plan Note (Signed)
We discussed alternatives Plan-see if she can tolerate Dymista with less nosebleed than Flonase, recognizing that fluticasone is a component. Also try Accolate to see if that is better tolerated than Singulair with some potential benefit for both asthma and rhinitis

## 2016-03-18 NOTE — Assessment & Plan Note (Signed)
Rescue inhaler once or twice a week seems to be sufficient for her. We discussed indications for a maintenance controller if needed

## 2016-03-26 ENCOUNTER — Ambulatory Visit: Payer: Self-pay | Admitting: Internal Medicine

## 2016-03-28 ENCOUNTER — Encounter: Payer: Self-pay | Admitting: Internal Medicine

## 2016-03-28 ENCOUNTER — Ambulatory Visit (INDEPENDENT_AMBULATORY_CARE_PROVIDER_SITE_OTHER): Payer: Medicare Other | Admitting: Internal Medicine

## 2016-03-28 VITALS — BP 110/70 | HR 61 | Ht 61.5 in | Wt 171.0 lb

## 2016-03-28 DIAGNOSIS — E119 Type 2 diabetes mellitus without complications: Secondary | ICD-10-CM | POA: Diagnosis not present

## 2016-03-28 DIAGNOSIS — E1165 Type 2 diabetes mellitus with hyperglycemia: Secondary | ICD-10-CM | POA: Insufficient documentation

## 2016-03-28 LAB — POCT GLYCOSYLATED HEMOGLOBIN (HGB A1C): Hemoglobin A1C: 6.9

## 2016-03-28 MED ORDER — GLIPIZIDE 5 MG PO TABS: ORAL_TABLET | ORAL | Status: DC

## 2016-03-28 MED ORDER — PITAVASTATIN CALCIUM 2 MG PO TABS: ORAL_TABLET | ORAL | Status: DC

## 2016-03-28 NOTE — Progress Notes (Signed)
Patient ID: [name], female   DOB: [birth year], 50 y.o.   MRN: [id number]  HPI: [name] is a 50 y.o.-year-old female, returning for f/u for DM2, dx 2013, non-insulin-dependent, well controlled, without complications. Last visit 3 mo ago.  A lot of stress lately >> ice cream >> sugars much higher.  Last hemoglobin A1c was: Lab Results  Component Value Date   HGBA1C 6.7 12/23/2015   HGBA1C 6.6* 08/26/2015   HGBA1C 6.3 05/20/2015   Pt is on a regimen of: - Cycloset 2.4 mg daily (increased in 06/2014) - Glipizide 5 mg 4x a day (the whole tablet of 5 mg >> low CBG) On Turmeric, alpha-lipoic acid, Garlicin.  She had various intolerances to different diabetes medications:  Metformin ER >> gastric discomfort and severe diarrhea (loss of bowel control).   Januvia >> tried for 1-2 months >> left chest pain  She was wondering if she could take Invokana, however she had multiple vaginal yeast infections 1-2 years ago. She has to urinate 3-4 x a night.   At last visit, she described reactive hypoglycemia symptoms >> I advised her to stop Quercetin (was taking it for allergies), as it is known to increase insulin production by the pancreas.  Pt is not checking sugars as she has a lot of stress since last visit - reviewed sugars from before: - am: 100-134 >> 116-126 >> 110-130 >> 136-140, 240 >> 112-157, 173 >> 115-144, 156 >> 120's >> 146-193 - 1h after b'fast: 207 (after banana bread) >> n/c - before lunch: 89-165, 187 >> 117, 119 >> 146-169, 255 - 2h after lunch: 187 >> n/c - bedtime: 111-156 >> 153-185 >> 153 (after snack) >> 142-223 - nighttime: 112-157  Has many food intolerances: - wheat >> cannot eat bread or pastry.  - raw vegetables, beans, cruciferous vegetables, fruit >> diarrhea.  - No dairy - She does not digest fat well after her cholecystectomy in 2011.   - No chronic kidney disease, last BUN/creatinine was:  Lab Results  Component Value Date   BUN 17  01/24/2016   CREATININE 0.94 01/24/2016  On Irbesartan.  - has a h/o HL - Last set of lipids greatly improved. She was on Questran for diarrhea >> stopped. She is afraid to use statins as her parents used them >> mm pain.  Lab Results  Component Value Date   CHOL 373* 01/24/2016   HDL 128.20 01/24/2016   LDLCALC 114* 08/26/2015   LDLDIRECT 205.0 01/24/2016   TRIG 205.0* 01/24/2016   CHOLHDL 3 01/24/2016  Previous cholesterol levels were: 325/197/132/154. Avoids statins as she was told these can increase depression.  - last dilated eye exam was in 12/2015 (Dr Randon Goldsmith) - no DR.  - + numbness and tingling in her legs. She is on R enantiomer of alpha lipoic acid.  She also has a history of PCOS - she has seen Dr. Rosine Door at [facility] in the past - note from 08/26/2012: perimenopause, and at that time she was on Yasmin, subsequently changed to Necon. She sees Dr. Marvel Plan with OB/GYN. She has PTSD, MDD, insomnia, anxiety, asthma, HTN, GERD, status post cholecystectomy, anemia, menorrhagia, improved a little on new OCP, transaminitis. She also had increased uterine bleeding (on Necon).  She continues to see an acupuncturist >> feeling much better and sleeping better, too. She takes Nettle powder >> helped her with diarrhea, anemia, insomnia.  I reviewed pt's medications, allergies, PMH, social hx, family hx, and changes were documented  in the history of present illness. Otherwise, unchanged from my initial visit note.  She started Dimesta.  ROS: Constitutional: no weight loss/gain, + fatigue, no subjective hyperthermia/hypothermia Eyes: no blurry vision, no xerophthalmia ENT: no sore throat, no nodules palpated in throat, no dysphagia/odynophagia, no hoarseness Cardiovascular: no CP/+ SOB/no palpitations/leg swelling Respiratory: no cough/+ SOB Gastrointestinal: + N/no V/+ D/no C Musculoskeletal: no muscle/joint aches Skin: no rashes Neurological: no  tremors/numbness/tingling/dizziness  PE: BP 110/70 mmHg  Pulse 61  Ht 5' 1.5" (1.562 m)  Wt 171 lb (77.565 kg)  BMI 31.79 kg/m2 Body mass index is 31.79 kg/(m^2).  Wt Readings from Last 3 Encounters:  03/28/16 171 lb (77.565 kg)  03/15/16 173 lb 9.6 oz (78.744 kg)  02/22/16 169 lb (76.658 kg)   Constitutional: overweight, truncal obesity, in NAD Eyes: PERRLA, EOMI, no exophthalmos ENT: moist mucous membranes, no thyromegaly, no cervical lymphadenopathy Cardiovascular: RRR, 2/6 SEM, no MRG Respiratory: CTA B Gastrointestinal: abdomen soft, NT, ND, BS+ Musculoskeletal: no deformities, strength intact in all 4, brisk reflexes in all 4 Skin: moist, warm, no rashes  ASSESSMENT: 1. DM2, non-insulin-dependent, fairly well controlled, without complications  2. HL - very high LDL - high HDL - slightly high TG  PLAN:  1. DM2 - Patient with controlled DM2, with good control. Last A1c was great, at 6.6%! She did not check sugars since last visit >> strongly advised to start checking!  - She has several intolerances to diabetic meds - metformin (even extended release), Januvia, glipizide. We cannot use Invokana due to h/o UTIs, and her nocturia. She continues on Cycloset and Glipizide. Will continue. - I advised her to:  Patient Instructions  Please continue: - Cycloset 2.4 mg daily in am - Glipizide 5 mg 2x a day   Start Benecol for lowering your cholesterol.  Start Livalo 2 mg every 7 days and move it closer together if no muscle pain.  Please return in 3 months with your sugar log.   - needs a new eye exam >> she is UTD - will check HbA1C today >> 6.9% (higher) - return to clinic in 4 months with her sugar log  2. HL - We reviewed together the last lipid panels. Her LDL increased to almost 2x the previous value! She refused a statin (which was suggested by PCP) as she was afraid of muscle aches, which her parents both experienced while they were taking statins. - We  discussed that not all statins are equal, and we can use some that would not cause as much muscle pain. We also have ways to go around it, for example, spacing the statin doses further apart - She agrees to try a statin and I suggested he follow, because this is the most diabetes-friendly  - We'll start with 1 tablet of 2 mg a week, and will advance until we hopefully we get to once a day  - Since her LDL is so high, I also suggested to start Benecol. - We'll recheck her lipids at next visit

## 2016-03-28 NOTE — Patient Instructions (Addendum)
Please continue: - Cycloset 2.4 mg daily in am - Glipizide 5 mg 2x a day   Start Benecol for lowering your cholesterol.  Start Livalo 2 mg every 7 days and move it closer together if no muscle pain.  Please return in 3 months with your sugar log.

## 2016-04-05 ENCOUNTER — Other Ambulatory Visit: Payer: Self-pay | Admitting: Internal Medicine

## 2016-04-05 NOTE — Telephone Encounter (Signed)
lmtcb x1 

## 2016-04-05 NOTE — Telephone Encounter (Signed)
Try otc nasalcrom/ cromol nasal spray, In between doses, try using otc nasal saline gel in nose

## 2016-04-05 NOTE — Telephone Encounter (Signed)
lmtcb

## 2016-04-05 NOTE — Telephone Encounter (Signed)
Called and spoke with pt and she would like to know if any other rx medication can be sent in for her.  Why is the dymista ok and not just a plain antihistamine spray? Please advise.  She stated that CY advised her of the same otc meds to use at her last OV.    thanks

## 2016-04-05 NOTE — Telephone Encounter (Signed)
Called and spoke with pt and she stated that the dymista also caused nose bleeds.  She stated that she feels that this is from the antihistamine part of the nasal spray.  Pt is requesting to try something else.  CY please advise. Thanks  Last ov--03/15/16 Next ov--no pending appts.   Allergies  Allergen Reactions  . Latex Itching and Swelling  . Penicillins Other (See Comments)    dizzy

## 2016-04-05 NOTE — Telephone Encounter (Signed)
She had said she thought the azelastine component- the drying antihistamine part of Dymista, was causing nose bleeds. Often the Flonase/ fluticasone component causes nose bleeds. We can send a script for either one by itself if she wishes.  Offer Rx Nasalcort,   # 1   1-2 puffs each nostril once daily at bedtime, refill x 12    See if she likes that better than flonase.

## 2016-04-09 ENCOUNTER — Other Ambulatory Visit: Payer: Self-pay | Admitting: Family

## 2016-04-09 NOTE — Telephone Encounter (Signed)
LVM for pt to return call

## 2016-04-09 NOTE — Telephone Encounter (Signed)
lmtcb x2 for pt. 

## 2016-04-10 ENCOUNTER — Other Ambulatory Visit: Payer: Self-pay | Admitting: Family

## 2016-04-11 MED ORDER — AZELASTINE HCL 0.1 % NA SOLN: 1.0000 | Freq: Two times a day (BID) | NASAL | 11 refills | Status: DC

## 2016-04-11 NOTE — Telephone Encounter (Signed)
lmomtcb x 3  

## 2016-04-11 NOTE — Telephone Encounter (Signed)
Spoke with the pt  I advised of her recs per CDY  She states that she never said that astelin caused nose bleeds She is requesting rx for this to be taken by itself b/s she states that this works best for her  I spoke with CDY and he agreed to send rx  Rx sent to pharm and nothing further needed

## 2016-04-13 ENCOUNTER — Other Ambulatory Visit: Payer: Self-pay | Admitting: Family

## 2016-04-25 ENCOUNTER — Other Ambulatory Visit: Payer: Self-pay

## 2016-04-25 MED ORDER — PRAVASTATIN SODIUM 20 MG PO TABS: 20.0000 mg | ORAL_TABLET | Freq: Every day | ORAL | 3 refills | Status: DC

## 2016-06-26 ENCOUNTER — Ambulatory Visit (INDEPENDENT_AMBULATORY_CARE_PROVIDER_SITE_OTHER): Payer: Medicare Other | Admitting: Internal Medicine

## 2016-06-26 ENCOUNTER — Encounter: Payer: Self-pay | Admitting: Internal Medicine

## 2016-06-26 VITALS — BP 118/74 | HR 67 | Ht 61.0 in | Wt 167.0 lb

## 2016-06-26 DIAGNOSIS — E119 Type 2 diabetes mellitus without complications: Secondary | ICD-10-CM

## 2016-06-26 DIAGNOSIS — E785 Hyperlipidemia, unspecified: Secondary | ICD-10-CM

## 2016-06-26 LAB — POCT GLYCOSYLATED HEMOGLOBIN (HGB A1C): HEMOGLOBIN A1C: 7

## 2016-06-26 MED ORDER — GLUCOSE BLOOD VI STRP: ORAL_STRIP | 3 refills | Status: DC

## 2016-06-26 NOTE — Addendum Note (Signed)
Addended by: Darene LamerHOMPSON, Carmencita Cusic T on: 06/26/2016 11:42 AM   Modules accepted: Orders

## 2016-06-26 NOTE — Patient Instructions (Addendum)
Please continue: - Cycloset 2.4 mg daily in am - Glipizide 5 mg 2x a day   Also, continue: - Benecol   Start: - Livalo 2 mg every 7 days   Please schedule an appt with Oran ReinLaura Jobe with nutrition.  Please return in 3 months with your sugar log.

## 2016-06-26 NOTE — Progress Notes (Signed)
Patient ID: [name], female   DOB: December 20, 1965, 50 y.o.   MRN: [id number]  HPI: Abigail CoveSara E Buchler is a 50 y.o.-year-old female, returning for f/u for DM2, dx 2013, non-insulin-dependent, well controlled, without complications. Last visit 3 mo ago.  At last visit, she had a lot of stress (she was in danger of being evicted) >> ate more ice cream >> sugars much higher. Over the summer, she still had a lot of stress >> now in last 2 weeks, stress is lower (lease extended until 11/2016) >> sugars better.   She has a new b'friend.  Last hemoglobin A1c was: Lab Results  Component Value Date   HGBA1C 6.9 03/28/2016   HGBA1C 6.7 12/23/2015   HGBA1C 6.6 (H) 08/26/2015   Pt is on a regimen of: - Cycloset 2.4 mg daily (increased in 06/2014) - missed many doses - Glipizide 5 mg 4x a day (the whole tablet of 5 mg >> low CBG) On Turmeric, alpha-lipoic acid, Garlicin.  She had various intolerances to different diabetes medications:  Metformin ER >> gastric discomfort and severe diarrhea (loss of bowel control).   Januvia >> tried for 1-2 months >> left chest pain  She was wondering if she could take Invokana, however she had multiple vaginal yeast infections 1-2 years ago. She has to urinate 3-4 x a night.   At last visit, she described reactive hypoglycemia symptoms >> I advised her to stop Quercetin (was taking it for allergies), as it is known to increase insulin production by the pancreas.  Pt is not checking sugars as she has a lot of stress - only few checks: - am: 136-140, 240 >> 112-157, 173 >> 115-144, 156 >> 120's >> 146-193 >> 161 - 1h after b'fast: 207 (after banana bread) >> n/c - before lunch: 89-165, 187 >> 117, 119 >> 146-169, 255 >> n/c - 2h after lunch: 187 >> n/c - bedtime: 111-156 >> 153-185 >> 153 (after snack) >> 142-223 >> 153 - nighttime: 112-157 >> n/c  Has many food intolerances: - wheat >> cannot eat bread or pastry.  - raw vegetables, beans, cruciferous  vegetables, fruit >> diarrhea.  - No dairy - She does not digest fat well after her cholecystectomy in 2011.   - No chronic kidney disease, last BUN/creatinine was:  Lab Results  Component Value Date   BUN 17 01/24/2016   CREATININE 0.94 01/24/2016  On Irbesartan.  - has a h/o HL - Last set of lipids greatly improved. She was on Questran for diarrhea >> stopped. She was afraid to use statins as her parents used them >> mm pain. She has the Livalo that I suggested at last visit, but did not start yet. She started Benecol. Lab Results  Component Value Date   CHOL 373 (H) 01/24/2016   HDL 128.20 01/24/2016   LDLCALC 114 (H) 08/26/2015   LDLDIRECT 205.0 01/24/2016   TRIG 205.0 (H) 01/24/2016   CHOLHDL 3 01/24/2016  Previous cholesterol levels were: 325/197/132/154.   - last dilated eye exam was in 12/2015 (Dr Randon GoldsmithLyles) - no DR.  - + numbness and tingling in her legs. She is on R enantiomer of alpha lipoic acid.  She also has a history of PCOS - she [facility] - note from 08/26/2012: perimenopause, and at that time she was on Yasmin, subsequently changed to Necon. She sees Dr. Marvel PlanMichelle Grewall with OB/GYN. She has PTSD, MDD, insomnia, anxiety, asthma, HTN, GERD, status  post cholecystectomy, anemia, menorrhagia, improved a little on new OCP, transaminitis. She also had increased uterine bleeding (on Necon). She continues to see an acupuncturist >> feeling much better and sleeping better, too. She takes Nettle powder >> helped her with diarrhea, anemia, insomnia. Last TSH was normal: Lab Results  Component Value Date   TSH 2.91 01/24/2016   I reviewed pt's medications, allergies, PMH, social hx, family hx, and changes were documented in the history of present illness. Otherwise, unchanged from my initial visit note.    ROS: Constitutional: no weight loss/gain, + fatigue, + subjective hyperthermia Eyes: no blurry vision, no xerophthalmia ENT: no sore  throat, no nodules palpated in throat, + dysphagia/no odynophagia, + hoarseness Cardiovascular: no CP/SOB/no palpitations/leg swelling Respiratory: no cough/SOB/+ wheezing Gastrointestinal: no N/V/D/C Musculoskeletal: no muscle/joint aches Skin: no rashes Neurological: no tremors/numbness/tingling/dizziness  PE: BP 118/74 (BP Location: Left Arm, Patient Position: Sitting)   Pulse 67   Ht 5\' 1"  (1.549 m)   Wt 167 lb (75.8 kg)   SpO2 97%   BMI 31.55 kg/m  Body mass index is 31.55 kg/m.  Wt Readings from Last 3 Encounters:  06/26/16 167 lb (75.8 kg)  03/28/16 171 lb (77.6 kg)  03/15/16 173 lb 9.6 oz (78.7 kg)   Constitutional: overweight, truncal obesity, in NAD Eyes: PERRLA, EOMI, no exophthalmos ENT: moist mucous membranes, no thyromegaly, no cervical lymphadenopathy Cardiovascular: RRR, 2/6 SEM, no MRG Respiratory: CTA B Gastrointestinal: abdomen soft, NT, ND, BS+ Musculoskeletal: no deformities, strength intact in all 4, brisk reflexes in all 4 Skin: moist, warm, no rashes  ASSESSMENT: 1. DM2, non-insulin-dependent, fairly well controlled, without complications  2. HL - very high LDL - high HDL - slightly high TG  PLAN:  1. DM2 - Patient with controlled DM2, with deteriorating control 2/2 stress >> dietary indiscretions. Last A1c was Higher, at 6.9%! She did not check many sugars since last visit >> strongly advised to start checking once a day, rotating checks!  - She has several intolerances to diabetic meds - metformin (even extended release), Januvia, glipizide. We cannot use Invokana due to h/o UTIs, and her nocturia. She continues on Cycloset and Glipizide. Will continue. - I advised her to:  Patient Instructions  Please continue: - Cycloset 2.4 mg daily in am - Glipizide 5 mg 2x a day   Also, continue: - Benecol   Start: - Livalo 2 mg every 7 days   Please return in 3 months with your sugar log.   - needs a new eye exam >> she is UTD - will check  HbA1C today >> 7.0% (higher) - return to clinic in 3 months with her sugar log  2. HL - We reviewed together the last lipid panels. Her LDL increased to almost 2x the previous value! She refused a statin in the past (which was suggested by PCP) as she was afraid of muscle aches, which her parents both experienced while they were taking statins. - At last visit, she agreed to try a statin and I suggested Livalo, because this is the most diabetes-friendly -  Start with 2 mg a week. She got this from the pharmacy but did not start yet. Advised to start. - Since her LDL was so high, I also suggested to start Benecol >> continue - We'll recheck her lipids today  Carlus Pavlov, MD PhD [facility]

## 2016-07-01 ENCOUNTER — Other Ambulatory Visit: Payer: Self-pay | Admitting: Family

## 2016-07-01 DIAGNOSIS — I10 Essential (primary) hypertension: Secondary | ICD-10-CM

## 2016-07-02 ENCOUNTER — Other Ambulatory Visit: Payer: Self-pay | Admitting: Family

## 2016-07-07 ENCOUNTER — Other Ambulatory Visit (HOSPITAL_COMMUNITY): Payer: Self-pay | Admitting: Psychiatry

## 2016-07-11 NOTE — Telephone Encounter (Signed)
[facility] from [facility] requesting a refill for Zoloft. Per Dr. Gilmore LarocheAkhtar, refill is authorized for Zoloft 100mg , #180. Rx was sent to pharmacy. Pt is schedule for a f/u appt on 11/8. lvm to informed pt of refill status.

## 2016-07-12 ENCOUNTER — Encounter: Payer: Self-pay | Admitting: Dietician

## 2016-07-17 ENCOUNTER — Ambulatory Visit (HOSPITAL_COMMUNITY): Payer: Self-pay | Admitting: Psychiatry

## 2016-07-19 ENCOUNTER — Other Ambulatory Visit: Payer: Self-pay | Admitting: *Deleted

## 2016-07-19 MED ORDER — ATENOLOL 50 MG PO TABS: ORAL_TABLET | ORAL | 1 refills | Status: DC

## 2016-07-25 ENCOUNTER — Other Ambulatory Visit: Payer: Self-pay | Admitting: Internal Medicine

## 2016-07-25 ENCOUNTER — Encounter (HOSPITAL_COMMUNITY): Payer: Self-pay | Admitting: Psychiatry

## 2016-07-25 ENCOUNTER — Ambulatory Visit (INDEPENDENT_AMBULATORY_CARE_PROVIDER_SITE_OTHER): Payer: Medicare Other | Admitting: Psychiatry

## 2016-07-25 VITALS — BP 140/80 | HR 60 | Wt 168.0 lb

## 2016-07-25 DIAGNOSIS — F3341 Major depressive disorder, recurrent, in partial remission: Secondary | ICD-10-CM | POA: Diagnosis not present

## 2016-07-25 DIAGNOSIS — F411 Generalized anxiety disorder: Secondary | ICD-10-CM | POA: Diagnosis not present

## 2016-07-25 DIAGNOSIS — Z79899 Other long term (current) drug therapy: Secondary | ICD-10-CM

## 2016-07-25 DIAGNOSIS — F4310 Post-traumatic stress disorder, unspecified: Secondary | ICD-10-CM | POA: Diagnosis not present

## 2016-07-25 DIAGNOSIS — Z818 Family history of other mental and behavioral disorders: Secondary | ICD-10-CM | POA: Diagnosis not present

## 2016-07-25 DIAGNOSIS — Z833 Family history of diabetes mellitus: Secondary | ICD-10-CM

## 2016-07-25 DIAGNOSIS — Z8249 Family history of ischemic heart disease and other diseases of the circulatory system: Secondary | ICD-10-CM

## 2016-07-25 MED ORDER — SERTRALINE HCL 100 MG PO TABS: ORAL_TABLET | ORAL | 0 refills | Status: DC
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
## 2016-07-27 ENCOUNTER — Other Ambulatory Visit: Payer: Self-pay | Admitting: Family

## 2016-09-27 ENCOUNTER — Ambulatory Visit (INDEPENDENT_AMBULATORY_CARE_PROVIDER_SITE_OTHER): Payer: PPO | Admitting: Internal Medicine

## 2016-09-27 ENCOUNTER — Ambulatory Visit: Payer: Self-pay | Admitting: Internal Medicine

## 2016-09-27 ENCOUNTER — Encounter: Payer: Self-pay | Admitting: Internal Medicine

## 2016-09-27 VITALS — BP 110/70 | HR 58 | Ht 61.0 in | Wt 168.0 lb

## 2016-09-27 DIAGNOSIS — E784 Other hyperlipidemia: Secondary | ICD-10-CM | POA: Diagnosis not present

## 2016-09-27 DIAGNOSIS — E119 Type 2 diabetes mellitus without complications: Secondary | ICD-10-CM

## 2016-09-27 DIAGNOSIS — E7849 Other hyperlipidemia: Secondary | ICD-10-CM

## 2016-09-27 LAB — POCT GLYCOSYLATED HEMOGLOBIN (HGB A1C): Hemoglobin A1C: 7.1

## 2016-09-27 MED ORDER — GLIPIZIDE 5 MG PO TABS: ORAL_TABLET | ORAL | 3 refills | Status: DC

## 2016-09-27 NOTE — Progress Notes (Signed)
Patient ID: [name], female   DOB: 1966/05/29, 51 y.o.   MRN: [id number]  HPI: [name] is a 51 y.o.-year-old female, returning for f/u for DM2, dx 2013, non-insulin-dependent, well controlled, without complications. Last visit 3 mo ago.  At last visit, she had a lot of stress (she was in danger of being evicted) >> ate more ice cream >> sugars higher. Since last visit, she again had dietary indiscretions, especially eating at night. Her sugars are higher.  Last hemoglobin A1c was: Lab Results  Component Value Date   HGBA1C 7.0 06/26/2016   HGBA1C 6.9 03/28/2016   HGBA1C 6.7 12/23/2015   Pt is on a regimen of: - Cycloset 2.4 mg daily (increased in 06/2014)  - Glipizide 5 mg 2x a day On Turmeric, alpha-lipoic acid, Garlicin.  She had various intolerances to different diabetes medications:  Metformin ER >> gastric discomfort and severe diarrhea (loss of bowel control).   Januvia >> tried for 1-2 months >> left chest pain  She was wondering if she could take Invokana, however she had multiple vaginal yeast infections 1-2 years ago. She has to urinate 3-4 x a night.   At last visit, she described reactive hypoglycemia symptoms >> I advised her to stop Quercetin (was taking it for allergies), as it is known to increase insulin production by the pancreas.  Checks sugars sporadically, 1x a day. - am: 136-140, 240 >> 112-157, 173 >> 115-144, 156 >> 120's >> 146-193 >> 161 >> 105, 129, 165-232, 300 - 1h after b'fast: 207 (after banana bread) >> n/c - before lunch: 89-165, 187 >> 117, 119 >> 146-169, 255 >> n/c >> 148 - 2h after lunch: 187 >> n/c - bedtime: 111-156 >> 153-185 >> 153 (after snack) >> 142-223 >> 153, 239 - nighttime: 112-157 >> n/c >> 151-270, 338  Has many food intolerances: - wheat >> cannot eat bread or pastry.  - raw vegetables, beans, cruciferous vegetables, fruit >> diarrhea.  - No dairy - She does not digest fat well after her cholecystectomy in 2011.    - No chronic kidney disease, last BUN/creatinine was:  Lab Results  Component Value Date   BUN 17 01/24/2016   CREATININE 0.94 01/24/2016  On Irbesartan.  - has a h/o HL - Last set of lipids greatly improved. She was on Questran for diarrhea >> stopped. I suggested Livalo several times in the past, but did not start yet. She continues Benecol spread. Lab Results  Component Value Date   CHOL 373 (H) 01/24/2016   HDL 128.20 01/24/2016   LDLCALC 114 (H) 08/26/2015   LDLDIRECT 205.0 01/24/2016   TRIG 205.0 (H) 01/24/2016   CHOLHDL 3 01/24/2016  Previous cholesterol levels were: 325/197/132/154.   - last dilated eye exam was in 12/2015 (Dr Randon GoldsmithLyles) - no DR.  - + numbness and tingling in her legs. She is on R enantiomer of alpha lipoic acid.  She also has a history of PCOS - she [facility] - note from 08/26/2012: perimenopause, and at that time she was on Yasmin, subsequently changed to Necon. She sees Dr. Marvel PlanMichelle Grewall with OB/GYN. She has PTSD, MDD, insomnia, anxiety, asthma, HTN, GERD, status post cholecystectomy, anemia, menorrhagia, improved a little on new OCP, transaminitis. She also had increased uterine bleeding (on Necon). She Is seeing an acupuncturist. She takes Nettle powder >> helped her with diarrhea, anemia, insomnia. Last TSH was normal: Lab Results  Component Value Date  TSH 2.91 01/24/2016   I reviewed pt's medications, allergies, PMH, social hx, family hx, and changes were documented in the history of present illness. Otherwise, unchanged from my initial visit note.    ROS: Constitutional: no weight loss/gain, + fatigue, No subjective hyperthermia, + Nocturia Eyes: no blurry vision, no xerophthalmia ENT: + sore throat, no nodules palpated in throat, no dysphagia/no odynophagia, no hoarseness Cardiovascular: no CP/+ SOB/no palpitations/leg swelling Respiratory: no cough/+ SOB/+ wheezing Gastrointestinal: no  N/V/D/C Musculoskeletal: no muscle/joint aches Skin: no rashes Neurological: no tremors/numbness/tingling/dizziness, + headaches  PE: BP 110/70 (BP Location: Left Arm, Patient Position: Sitting)   Pulse (!) 58   Ht 5\' 1"  (1.549 m)   Wt 168 lb (76.2 kg)   SpO2 96%   BMI 31.74 kg/m  Body mass index is 31.74 kg/m.  Wt Readings from Last 3 Encounters:  09/27/16 168 lb (76.2 kg)  06/26/16 167 lb (75.8 kg)  03/28/16 171 lb (77.6 kg)   Constitutional: Obese, truncal obesity, in NAD Eyes: PERRLA, EOMI, no exophthalmos ENT: moist mucous membranes, no thyromegaly, no cervical lymphadenopathy Cardiovascular: RRR, 2/6 SEM, no MRG Respiratory: CTA B Gastrointestinal: abdomen soft, NT, ND, BS+ Musculoskeletal: no deformities, strength intact in all 4, brisk reflexes in all 4 Skin: moist, warm, no rashes  ASSESSMENT: 1. DM2, non-insulin-dependent, fairly well controlled, without complications  2. HL - very high LDL - high HDL - slightly high TG  PLAN:  1. DM2 - Patient with controlled DM2, with deteriorating control 2/2 stress and dietary indiscretions. Last A1c was higher, at 7.0%. At this visit, her sugars are even higher than before and she eats frequently at night. Subsequently, the sugars are high in the morning. I strongly advised her to stop eating at night. - She has several intolerances to diabetic meds - metformin (even extended release), Januvia, glipizide. We cannot use Invokana due to h/o UTIs, and her nocturia. She continues on Cycloset and Glipizide, however, will increase glipizide before dinner. - I advised her to:  Patient Instructions  Please continue: - Cycloset 2.4 mg daily in am  Please increase: - Glipizide to 5 mg in am and 10 mg before dinner.  Do not eat at night unless sugars <70.  Start: - Livalo 2 mg every 7 days   Please return in 3 months with your sugar log.    - needs a new eye exam >> she is UTD - will check HbA1C today >> 7.1% (higher) -  return to clinic in 3 months with her sugar log  2. HL - We again reviewed together the last lipid panels. Her LDL is very high, 200! She refused a statin in the past (which was suggested by PCP) as she was afraid of muscle aches, which her parents both experienced while they were taking statins, but last visit, she agreed to try a statin and I suggested Livalo, because this is the most diabetes-friendly (2 mg a week). She still did not start yet. I again suggested that she started, but would not address this in the future, if she is not compliant with the recommendation. - will continue Benecol   Carlus Pavlov, MD PhD [facility] End[facility]

## 2016-09-27 NOTE — Patient Instructions (Addendum)
Please continue: - Cycloset 2.4 mg daily in am  Please increase: - Glipizide to 5 mg in am and 10 mg before dinner.  Do not eat at night unless sugars <70.  Start: - Livalo 2 mg every 7 days   Please return in 3 months with your sugar log.

## 2016-09-30 ENCOUNTER — Other Ambulatory Visit: Payer: Self-pay | Admitting: Gynecology

## 2016-10-02 ENCOUNTER — Other Ambulatory Visit: Payer: Self-pay | Admitting: Family

## 2016-10-02 NOTE — Telephone Encounter (Signed)
Routing to greg, please advise, thanks 

## 2016-10-09 ENCOUNTER — Other Ambulatory Visit (HOSPITAL_COMMUNITY): Payer: Self-pay | Admitting: Psychiatry

## 2016-10-18 ENCOUNTER — Telehealth: Payer: Self-pay | Admitting: Internal Medicine

## 2016-10-18 ENCOUNTER — Encounter: Payer: PPO | Attending: Internal Medicine | Admitting: Dietician

## 2016-10-18 ENCOUNTER — Other Ambulatory Visit: Payer: Self-pay

## 2016-10-18 ENCOUNTER — Encounter: Payer: Self-pay | Admitting: Dietician

## 2016-10-18 DIAGNOSIS — E785 Hyperlipidemia, unspecified: Secondary | ICD-10-CM | POA: Diagnosis not present

## 2016-10-18 DIAGNOSIS — E119 Type 2 diabetes mellitus without complications: Secondary | ICD-10-CM | POA: Diagnosis not present

## 2016-10-18 MED ORDER — PITAVASTATIN CALCIUM 2 MG PO TABS: ORAL_TABLET | ORAL | 2 refills | Status: DC

## 2016-10-18 NOTE — Progress Notes (Signed)
Diabetes Self-Management Education  Visit Type: First/Initial  Appt. Start Time: 1455 Appt. End Time: 1610  10/18/2016  Ms. Jonelle Bann, identified by name and date of birth, is a 51 y.o. female with a diagnosis of Diabetes: Type 2. Other hx includes hyperlipidemia, depression, PTSD, HTN, IBSD and may food intolerances. She has problems when she eats dairy, wheat, sorghum, most grains and follows a mostly gluten free diet.  She also does not tolerate bananas, oranges, other citrus except for lemons and limes, lettuce, peanuts, beans, cruciferous vegetables, spinach, and kale.  She does not eat pork or shellfish as she followed a Kosher diet in the past.  She doesn't understand how to follow a healthy diabetic diet with these restrictions.  Labs include: cholesterol 373, HDL 128, LDL 244, and triglycerides 205 (01/24/16), A1C 7.1% 09/27/16 which increased.  She states that she overeats at night on a box of cookies or crackers.  She skips breakfast as she sleeps in.  She was shaking during this visit today.  Blood sugar checked and was 169.  Medications include Cycloset, glucotrol, and omega 3.  Patient lives alone in section 8 housing.  She reports increased stress due to a letter that stated that she was no longer approved to stay in her apartment.  She is on disability from PTSD and was previously a Sales executive.  ASSESSMENT  Height 5\' 1"  (1.549 m), weight 169 lb (76.7 kg). Body mass index is 31.93 kg/m.      Diabetes Self-Management Education - 10/18/16 1513      Visit Information   Visit Type First/Initial     Initial Visit   Diabetes Type Type 2   Are you currently following a meal plan? No   Are you taking your medications as prescribed? Yes   Date Diagnosed 2013     Health Coping   How would you rate your overall health? Fair     Psychosocial Assessment   Patient Belief/Attitude about Diabetes Motivated to manage diabetes   Self-care barriers None   Self-management support Doctor's office;Friends   Other persons present Patient   Patient Concerns Nutrition/Meal planning;Glycemic Control   Special Needs None   Preferred Learning Style Hands on   Learning Readiness Ready   How often do you need to have someone help you when you read instructions, pamphlets, or other written materials from your doctor or pharmacy? 1 - Never   What is the last grade level you completed in school? 6 years college     Pre-Education Assessment   Patient understands the diabetes disease and treatment process. Needs Review   Patient understands incorporating nutritional management into lifestyle. Needs Review   Patient undertands incorporating physical activity into lifestyle. Needs Review   Patient understands using medications safely. Needs Review   Patient understands monitoring blood glucose, interpreting and using results Needs Review   Patient understands prevention, detection, and treatment of acute complications. Needs Review   Patient understands prevention, detection, and treatment of chronic complications. Needs Review   Patient understands how to develop strategies to address psychosocial issues. Needs Review   Patient understands how to develop strategies to promote health/change behavior. Needs Review     Complications   Last HgB A1C per patient/outside source 7.1 %  09/27/16   How often do you check your blood sugar? 1-2 times/day   Fasting Blood glucose range (mg/dL) 161-096   Postprandial Blood glucose range (mg/dL) 045-409;811-914;>782   Number of hypoglycemic episodes per month 1  Can you tell when your blood sugar is low? Yes   What do you do if your blood sugar is low? eat something with protein and fat   Number of hyperglycemic episodes per week 7   Can you tell when your blood sugar is high? No   Have you had a dilated eye exam in the past 12 months? Yes   Have you had a dental exam in the past 12 months? No   Are you checking your  feet? Yes   How many days per week are you checking your feet? 1     Dietary Intake   Breakfast skips  1-2   Snack (morning) none   Lunch lean protein ([place] or haburger, fish with vegetabel (asparagus) and 1 slice glutin free bread if she has it   Snack (afternoon) cashews or other nuts   Dinner rice or spaghetti with ground meat and vegetables and prepared sauce   Snack (evening) cookies or crackers "the whole box"   Beverage(s) water     Exercise   Exercise Type Light (walking / raking leaves)  walks dogs several times per day   How many days per week to you exercise? 7   How many minutes per day do you exercise? 30   Total minutes per week of exercise 210     Patient Education   Previous Diabetes Education No   Disease state  Definition of diabetes, type 1 and 2, and the diagnosis of diabetes   Nutrition management  Role of diet in the treatment of diabetes and the relationship between the three main macronutrients and blood glucose level;Food label reading, portion sizes and measuring food.;Carbohydrate counting;Meal options for control of blood glucose level and chronic complications.   Physical activity and exercise  Role of exercise on diabetes management, blood pressure control and cardiac health.;Helped patient identify appropriate exercises in relation to his/her diabetes, diabetes complications and other health issue.   Monitoring Identified appropriate SMBG and/or A1C goals.;Purpose and frequency of SMBG.   Acute complications Taught treatment of hypoglycemia - the 15 rule.   Chronic complications Relationship between chronic complications and blood glucose control;Assessed and discussed foot care and prevention of foot problems   Psychosocial adjustment Worked with patient to identify barriers to care and solutions;Role of stress on diabetes   Personal strategies to promote health Lifestyle issues that need to be addressed for better diabetes care     Individualized  Goals (developed by patient)   Nutrition General guidelines for healthy choices and portions discussed   Physical Activity 30 minutes per day;Exercise 5-7 days per week   Medications take my medication as prescribed   Monitoring  test my blood glucose as discussed   Reducing Risk do foot checks daily;examine blood glucose patterns     Post-Education Assessment   Patient understands the diabetes disease and treatment process. Demonstrates understanding / competency   Patient understands incorporating nutritional management into lifestyle. Needs Review   Patient undertands incorporating physical activity into lifestyle. Demonstrates understanding / competency   Patient understands using medications safely. Demonstrates understanding / competency   Patient understands monitoring blood glucose, interpreting and using results Demonstrates understanding / competency   Patient understands prevention, detection, and treatment of acute complications. Demonstrates understanding / competency   Patient understands prevention, detection, and treatment of chronic complications. Demonstrates understanding / competency   Patient understands how to develop strategies to address psychosocial issues. Demonstrates understanding / competency   Patient understands how to develop  strategies to promote health/change behavior. Demonstrates understanding / competency     Outcomes   Expected Outcomes Demonstrated interest in learning. Expect positive outcomes   Future DMSE 4-6 wks   Program Status Completed      Individualized Plan for Diabetes Self-Management Training:   Learning Objective:  Patient will have a greater understanding of diabetes self-management. Patient education plan is to attend individual and/or group sessions per assessed needs and concerns.   Plan:   Patient Instructions  Keep an active lifestyle.  Aim for 30 minutes most days. Work to normalize your eating.  Have Breakfast, lunch, and  dinner rather than 2 meals and overeating a snack that does not have the nutritional value.  This can avoid craving carbohydrates later at night. Small snack when hungry. Small amount of protein with each meal and snack. Continue to check your blood sugar as recommended. Continue to take your medication daily.   Expected Outcomes:  Demonstrated interest in learning. Expect positive outcomes  Education material provided: Living Well with Diabetes, Food label handouts, A1C conversion sheet, Meal plan card, My Plate, Snack sheet and Support group flyer  If problems or questions, patient to contact team via:  Phone and Email  Future DSME appointment: 4-6 wks

## 2016-10-18 NOTE — Telephone Encounter (Signed)
rx submitted.  

## 2016-10-18 NOTE — Telephone Encounter (Signed)
Pt needs her Pitavastatin Calcium refilled and sent to the [facility] on [address].

## 2016-10-18 NOTE — Patient Instructions (Signed)
Keep an active lifestyle.  Aim for 30 minutes most days. Work to normalize your eating.  Have Breakfast, lunch, and dinner rather than 2 meals and overeating a snack that does not have the nutritional value.  This can avoid craving carbohydrates later at night. Small snack when hungry. Small amount of protein with each meal and snack. Continue to check your blood sugar as recommended. Continue to take your medication daily.

## 2016-10-28 NOTE — Telephone Encounter (Signed)
Medication refill- [facility]ill for Zoloft. Per Dr. Gilmore LarocheAkhtar, refill request is authorize for Zoloft 100mg , #180. Prescription was sent to pharmacy. Pt's next apt is schedule on 01/04/17. Called and informed pt of refill status. Pt verbalizes understanding.

## 2016-11-16 ENCOUNTER — Ambulatory Visit: Payer: Self-pay | Admitting: Dietician

## 2016-11-21 ENCOUNTER — Other Ambulatory Visit: Payer: Self-pay | Admitting: Internal Medicine

## 2016-12-25 ENCOUNTER — Other Ambulatory Visit: Payer: Self-pay | Admitting: Family

## 2016-12-27 ENCOUNTER — Ambulatory Visit: Payer: Self-pay | Admitting: Internal Medicine

## 2016-12-29 ENCOUNTER — Other Ambulatory Visit: Payer: Self-pay | Admitting: Family

## 2016-12-29 DIAGNOSIS — I10 Essential (primary) hypertension: Secondary | ICD-10-CM

## 2017-01-02 ENCOUNTER — Other Ambulatory Visit: Payer: Self-pay | Admitting: Family

## 2017-01-04 ENCOUNTER — Encounter (HOSPITAL_COMMUNITY): Payer: Self-pay | Admitting: Psychiatry

## 2017-01-04 ENCOUNTER — Ambulatory Visit (INDEPENDENT_AMBULATORY_CARE_PROVIDER_SITE_OTHER): Payer: PPO | Admitting: Psychiatry

## 2017-01-04 VITALS — BP 124/68 | HR 60 | Resp 16 | Ht 61.0 in | Wt 167.0 lb

## 2017-01-04 DIAGNOSIS — F3341 Major depressive disorder, recurrent, in partial remission: Secondary | ICD-10-CM

## 2017-01-04 DIAGNOSIS — Z810 Family history of intellectual disabilities: Secondary | ICD-10-CM | POA: Diagnosis not present

## 2017-01-04 DIAGNOSIS — Z79899 Other long term (current) drug therapy: Secondary | ICD-10-CM

## 2017-01-04 DIAGNOSIS — F4310 Post-traumatic stress disorder, unspecified: Secondary | ICD-10-CM | POA: Diagnosis not present

## 2017-01-04 DIAGNOSIS — Z818 Family history of other mental and behavioral disorders: Secondary | ICD-10-CM | POA: Diagnosis not present

## 2017-01-04 DIAGNOSIS — F411 Generalized anxiety disorder: Secondary | ICD-10-CM | POA: Diagnosis not present

## 2017-01-04 MED ORDER — SERTRALINE HCL 100 MG PO TABS: ORAL_TABLET | ORAL | 1 refills | Status: DC
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
## 2017-01-07 ENCOUNTER — Other Ambulatory Visit (HOSPITAL_COMMUNITY): Payer: Self-pay | Admitting: Psychiatry

## 2017-01-09 NOTE — Telephone Encounter (Signed)
Received fax from [facility] requesting a refill for Zoloft. Per Dr. Gilmore Laroche, refill request is denied. Pt received a print Rx for Zoloft , #180 on 01/04/17 w/ 1 refill. Pt's next apt is schedule on 02/05/17. Nothing further is needed at this time.

## 2017-01-28 ENCOUNTER — Other Ambulatory Visit: Payer: Self-pay | Admitting: Family

## 2017-01-30 ENCOUNTER — Encounter: Payer: Self-pay | Admitting: Gynecology

## 2017-02-05 ENCOUNTER — Encounter: Payer: Self-pay | Admitting: Family

## 2017-02-05 ENCOUNTER — Ambulatory Visit (INDEPENDENT_AMBULATORY_CARE_PROVIDER_SITE_OTHER): Payer: PPO | Admitting: Family

## 2017-02-05 VITALS — BP 128/72 | HR 54 | Temp 98.4°F | Resp 16 | Ht 61.0 in | Wt 169.0 lb

## 2017-02-05 DIAGNOSIS — J4530 Mild persistent asthma, uncomplicated: Secondary | ICD-10-CM

## 2017-02-05 DIAGNOSIS — E119 Type 2 diabetes mellitus without complications: Secondary | ICD-10-CM | POA: Diagnosis not present

## 2017-02-05 DIAGNOSIS — Z23 Encounter for immunization: Secondary | ICD-10-CM

## 2017-02-05 DIAGNOSIS — F332 Major depressive disorder, recurrent severe without psychotic features: Secondary | ICD-10-CM

## 2017-02-05 DIAGNOSIS — Z0000 Encounter for general adult medical examination without abnormal findings: Secondary | ICD-10-CM | POA: Diagnosis not present

## 2017-02-05 DIAGNOSIS — E784 Other hyperlipidemia: Secondary | ICD-10-CM

## 2017-02-05 DIAGNOSIS — E7849 Other hyperlipidemia: Secondary | ICD-10-CM

## 2017-02-05 DIAGNOSIS — I10 Essential (primary) hypertension: Secondary | ICD-10-CM | POA: Diagnosis not present

## 2017-02-05 NOTE — Assessment & Plan Note (Signed)
Blood pressure appears adequately controlled with current medication regimen and no adverse side effects. No new symptoms of end organ damage noted on physical exam. Continue to monitor blood pressure at home and follow sodium diet.

## 2017-02-05 NOTE — Assessment & Plan Note (Signed)
Stable with current medication regimen and no adverse side effects or myalgias. Continue current dosage of pitavastatin.

## 2017-02-05 NOTE — Assessment & Plan Note (Signed)
Stable with no current symptoms of exacerbation. Continue to monitor.

## 2017-02-05 NOTE — Assessment & Plan Note (Addendum)
Reviewed and updated patient's medical, surgical, family and social history. Medications and allergies were also reviewed. Basic screenings for depression, activities of daily living, hearing, cognition and safety were performed. Provider list was updated and health plan was provided to the patient.   Overall well exam with chronic conditions appearing adequately managed. Pneumovax updated today. Declines mammogram. Has cervical cancer screening with gynecology. Encouraged to continue walking and consuming a moderate, balance nutritional intake. Continue other healthy lifestyle behaviors and choices. Follow up prevention exam in 1 year. Follow up office visit for chronic conditions.

## 2017-02-05 NOTE — Assessment & Plan Note (Signed)
Stable with current regimen and no adverse side effects or suicidal ideations. Follow up and changes per psychiatry.

## 2017-02-05 NOTE — Patient Instructions (Addendum)
Thank you for choosing [facility].  SUMMARY AND INSTRUCTIONS:  Please continue to take your medication as prescribed.  Follow up with Dr. Cruzita Lederer for diabetes.    Labs:  Please stop by the lab on the lower level of the building for your blood work. Your results will be released to [place] (or called to you) after review, usually within 72 hours after test completion. If any changes need to be made, you will be notified at that same time.  1.) The lab is open from 7:30am to 5:30 pm Monday-Friday 2.) No appointment is necessary 3.) Fasting (if needed) is 6-8 hours after food and drink; black coffee and water are okay   Follow up:  If your symptoms worsen or fail to improve, please contact our office for further instruction, or in case of emergency go directly to the emergency room at the closest medical facility.   Health Maintenance  Topic Date Due  . PNEUMOCOCCAL POLYSACCHARIDE VACCINE (1) 12/12/1969  . FOOT EXAM  12/12/1977  . HIV Screening  12/13/1982  . MAMMOGRAM  08/07/2013  . OPHTHALMOLOGY EXAM  12/27/2016  . HEMOGLOBIN A1C  03/27/2017  . INFLUENZA VACCINE  04/17/2017  . PAP SMEAR  10/29/2017  . TETANUS/TDAP  02/16/2019   Health Maintenance, Female Adopting a healthy lifestyle and getting preventive care can go a long way to promote health and wellness. Talk with your health care provider about what schedule of regular examinations is right for you. This is a good chance for you to check in with your provider about disease prevention and staying healthy. In between checkups, there are plenty of things you can do on your own. Experts have done a lot of research about which lifestyle changes and preventive measures are most likely to keep you healthy. Ask your health care provider for more information. Weight and diet Eat a healthy diet  Be sure to include plenty of vegetables, fruits, low-fat dairy products, and lean protein.  Do not eat a lot of foods high in  solid fats, added sugars, or salt.  Get regular exercise. This is one of the most important things you can do for your health.  Most adults should exercise for at least 150 minutes each week. The exercise should increase your heart rate and make you sweat (moderate-intensity exercise).  Most adults should also do strengthening exercises at least twice a week. This is in addition to the moderate-intensity exercise. Maintain a healthy weight  Body mass index (BMI) is a measurement that can be used to identify possible weight problems. It estimates body fat based on height and weight. Your health care provider can help determine your BMI and help you achieve or maintain a healthy weight.  For females 34 years of age and older:  A BMI below 18.5 is considered underweight.  A BMI of 18.5 to 24.9 is normal.  A BMI of 25 to 29.9 is considered overweight.  A BMI of 30 and above is considered obese. Watch levels of cholesterol and blood lipids  You should start having your blood tested for lipids and cholesterol at 49 years of age, then have this test every 5 years.  You may need to have your cholesterol levels checked more often if:  Your lipid or cholesterol levels are high.  You are older than 49 years of age.  You are at high risk for heart disease. Cancer screening Lung Cancer  Lung cancer screening is recommended for adults 55-71 years old who are at  high risk for lung cancer because of a history of smoking.  A yearly low-dose CT scan of the lungs is recommended for people who:  Currently smoke.  Have quit within the past 15 years.  Have at least a 30-pack-year history of smoking. A pack year is smoking an average of one pack of cigarettes a day for 1 year.  Yearly screening should continue until it has been 15 years since you quit.  Yearly screening should stop if you develop a health problem that would prevent you from having lung cancer treatment. Breast  Cancer  Practice breast self-awareness. This means understanding how your breasts normally appear and feel.  It also means doing regular breast self-exams. Let your health care provider know about any changes, no matter how small.  If you are in your 20s or 30s, you should have a clinical breast exam (CBE) by a health care provider every 1-3 years as part of a regular health exam.  If you are 66 or older, have a CBE every year. Also consider having a breast X-ray (mammogram) every year.  If you have a family history of breast cancer, talk to your health care provider about genetic screening.  If you are at high risk for breast cancer, talk to your health care provider about having an MRI and a mammogram every year.  Breast cancer gene (BRCA) assessment is recommended for women who have family members with BRCA-related cancers. BRCA-related cancers include:  Breast.  Ovarian.  Tubal.  Peritoneal cancers.  Results of the assessment will determine the need for genetic counseling and BRCA1 and BRCA2 testing. Cervical Cancer  Your health care provider may recommend that you be screened regularly for cancer of the pelvic organs (ovaries, uterus, and vagina). This screening involves a pelvic examination, including checking for microscopic changes to the surface of your cervix (Pap test). You may be encouraged to have this screening done every 3 years, beginning at age 20.  For women ages 73-65, health care providers may recommend pelvic exams and Pap testing every 3 years, or they may recommend the Pap and pelvic exam, combined with testing for human papilloma virus (HPV), every 5 years. Some types of HPV increase your risk of cervical cancer. Testing for HPV may also be done on women of any age with unclear Pap test results.  Other health care providers may not recommend any screening for nonpregnant women who are considered low risk for pelvic cancer and who do not have symptoms. Ask your  health care provider if a screening pelvic exam is right for you.  If you have had past treatment for cervical cancer or a condition that could lead to cancer, you need Pap tests and screening for cancer for at least 20 years after your treatment. If Pap tests have been discontinued, your risk factors (such as having a new sexual partner) need to be reassessed to determine if screening should resume. Some women have medical problems that increase the chance of getting cervical cancer. In these cases, your health care provider may recommend more frequent screening and Pap tests. Colorectal Cancer  This type of cancer can be detected and often prevented.  Routine colorectal cancer screening usually begins at 50 years of age and continues through 49 years of age.  Your health care provider may recommend screening at an earlier age if you have risk factors for colon cancer.  Your health care provider may also recommend using home test kits to check for hidden blood  in the stool.  A small camera at the end of a tube can be used to examine your colon directly (sigmoidoscopy or colonoscopy). This is done to check for the earliest forms of colorectal cancer.  Routine screening usually begins at age 105.  Direct examination of the colon should be repeated every 5-10 years through 49 years of age. However, you may need to be screened more often if early forms of precancerous polyps or small growths are found. Skin Cancer  Check your skin from head to toe regularly.  Tell your health care provider about any new moles or changes in moles, especially if there is a change in a mole's shape or color.  Also tell your health care provider if you have a mole that is larger than the size of a pencil eraser.  Always use sunscreen. Apply sunscreen liberally and repeatedly throughout the day.  Protect yourself by wearing long sleeves, pants, a wide-brimmed hat, and sunglasses whenever you are outside. Heart  disease, diabetes, and high blood pressure  High blood pressure causes heart disease and increases the risk of stroke. High blood pressure is more likely to develop in:  People who have blood pressure in the high end of the normal range (130-139/85-89 mm Hg).  People who are overweight or obese.  People who are African American.  If you are 41-60 years of age, have your blood pressure checked every 3-5 years. If you are 4 years of age or older, have your blood pressure checked every year. You should have your blood pressure measured twice-once when you are at a hospital or clinic, and once when you are not at a hospital or clinic. Record the average of the two measurements. To check your blood pressure when you are not at a hospital or clinic, you can use:  An automated blood pressure machine at a pharmacy.  A home blood pressure monitor.  If you are between 84 years and 44 years old, ask your health care provider if you should take aspirin to prevent strokes.  Have regular diabetes screenings. This involves taking a blood sample to check your fasting blood sugar level.  If you are at a normal weight and have a low risk for diabetes, have this test once every three years after 49 years of age.  If you are overweight and have a high risk for diabetes, consider being tested at a younger age or more often. Preventing infection Hepatitis B  If you have a higher risk for hepatitis B, you should be screened for this virus. You are considered at high risk for hepatitis B if:  You were born in a country where hepatitis B is common. Ask your health care provider which countries are considered high risk.  Your parents were born in a high-risk country, and you have not been immunized against hepatitis B (hepatitis B vaccine).  You have HIV or AIDS.  You use needles to inject street drugs.  You live with someone who has hepatitis B.  You have had sex with someone who has hepatitis  B.  You get hemodialysis treatment.  You take certain medicines for conditions, including cancer, organ transplantation, and autoimmune conditions. Hepatitis C  Blood testing is recommended for:  Everyone born from [birth year] through 1963.  Anyone with known risk factors for hepatitis C. Sexually transmitted infections (STIs)  You should be screened for sexually transmitted infections (STIs) including gonorrhea and chlamydia if:  You are sexually active and are younger than 49  years of age.  You are older than 49 years of age and your health care provider tells you that you are at risk for this type of infection.  Your sexual activity has changed since you were last screened and you are at an increased risk for chlamydia or gonorrhea. Ask your health care provider if you are at risk.  If you do not have HIV, but are at risk, it may be recommended that you take a prescription medicine daily to prevent HIV infection. This is called pre-exposure prophylaxis (PrEP). You are considered at risk if:  You are sexually active and do not regularly use condoms or know the HIV status of your partner(s).  You take drugs by injection.  You are sexually active with a partner who has HIV. Talk with your health care provider about whether you are at high risk of being infected with HIV. If you choose to begin PrEP, you should first be tested for HIV. You should then be tested every 3 months for as long as you are taking PrEP. Pregnancy  If you are premenopausal and you may become pregnant, ask your health care provider about preconception counseling.  If you may become pregnant, take 400 to 800 micrograms (mcg) of folic acid every day.  If you want to prevent pregnancy, talk to your health care provider about birth control (contraception). Osteoporosis and menopause  Osteoporosis is a disease in which the bones lose minerals and strength with aging. This can result in serious bone fractures. Your  risk for osteoporosis can be identified using a bone density scan.  If you are 80 years of age or older, or if you are at risk for osteoporosis and fractures, ask your health care provider if you should be screened.  Ask your health care provider whether you should take a calcium or vitamin D supplement to lower your risk for osteoporosis.  Menopause may have certain physical symptoms and risks.  Hormone replacement therapy may reduce some of these symptoms and risks. Talk to your health care provider about whether hormone replacement therapy is right for you. Follow these instructions at home:  Schedule regular health, dental, and eye exams.  Stay current with your immunizations.  Do not use any tobacco products including cigarettes, chewing tobacco, or electronic cigarettes.  If you are pregnant, do not drink alcohol.  If you are breastfeeding, limit how much and how often you drink alcohol.  Limit alcohol intake to no more than 1 drink per day for nonpregnant women. One drink equals 12 ounces of beer, 5 ounces of wine, or 1 ounces of hard liquor.  Do not use street drugs.  Do not share needles.  Ask your health care provider for help if you need support or information about quitting drugs.  Tell your health care provider if you often feel depressed.  Tell your health care provider if you have ever been abused or do not feel safe at home. This information is not intended to replace advice given to you by your health care provider. Make sure you discuss any questions you have with your health care provider. Document Released: 03/19/2011 Document Revised: 02/09/2016 Document Reviewed: 06/07/2015 Elsevier Interactive Patient Education  2017 [employer].

## 2017-02-05 NOTE — Assessment & Plan Note (Signed)
Most recent A1c was adequate diabetes control and no hypoglycemic readings or adverse side effects. Continue monitor blood sugar at home with changes and follow-up per endocrinology.

## 2017-02-05 NOTE — Progress Notes (Signed)
Subjective:    Patient ID: [name], female    DOB: 1965/10/31, 51 y.o.   MRN: [id number]  Chief Complaint  Patient presents with  . CPE    not fasting, states no refills at this time    HPI:  [name] is a 51 y.o. female who presents today for a Medicare Annual Wellness/Physical exam.    1) Health Maintenance -   Diet - Averages about 2-3 meals and snacks consisting of a lower carbohydrate diet;  Caffeine intake of 1-2 cups occasionally.   Exercise - Long walks daily with her dogs  2) Preventative Exams / Immunizations:  Dental -- Due for exam   Vision -- Up to date   Health Maintenance  Topic Date Due  . PNEUMOCOCCAL POLYSACCHARIDE VACCINE (1) 12/12/1969  . FOOT EXAM  12/12/1977  . HIV Screening  12/13/1982  . MAMMOGRAM  08/07/2013  . OPHTHALMOLOGY EXAM  12/27/2016  . HEMOGLOBIN A1C  03/27/2017  . INFLUENZA VACCINE  04/17/2017  . PAP SMEAR  10/29/2017  . TETANUS/TDAP  02/16/2019     Immunization History  Administered Date(s) Administered  . Influenza,inj,Quad PF,36+ Mos 07/10/2013, 07/02/2014  . Pneumococcal Polysaccharide-23 02/05/2017    RISK FACTORS  Tobacco History  Smoking Status  . Never Smoker  Smokeless Tobacco  . Never Used     Cardiac risk factors: diabetes mellitus, dyslipidemia, hypertension and obesity (BMI >= 30 kg/m2).  Depression Screen  Depression screen [facility] 2/9 02/05/2017  Decreased Interest 0  Down, Depressed, Hopeless 0  PHQ - 2 Score 0     Activities of Daily Living In your present state of health, do you have any difficulty performing the following activities?:  Driving? No Managing money?  No Feeding yourself? No Getting from bed to chair? No Climbing a flight of stairs? No Preparing food and eating?: No Bathing or showering? No Getting dressed: No Getting to the toilet? No Using the toilet: No Moving around from place to place: No In the past year have you fallen or had a near fall?:No   Home  Safety Has smoke detector and wears seat belts. No excess sun exposure. Are there smokers in your home (other than you)?  No Do you feel safe at home?  Yes  Hearing Difficulties: No Do you often ask people to speak up or repeat themselves? No Do you experience ringing or noises in your ears? No  Do you have difficulty understanding soft or whispered voices? No    Cognitive Testing  Alert? Yes   Normal Appearance? Yes  Oriented to person? Yes  Place? Yes   Time? Yes  Recall of three objects?  Yes  Can perform simple calculations? Yes  Displays appropriate judgment? Yes  Can read the correct time from a watch face? Yes  Do you feel that you have a problem with memory? No  Do you often misplace items? No   Advanced Directives have been discussed with the patient? Yes   Current Physicians/Providers and Suppliers  1. Marcos EkeGreg Calone, FNP - Internal Medicine 2. Thresa RossNadeem Akhtar, MD - Psychiatry 3. Carlus Pavlovristina Gherghe, MD - Endocrinology 4. Reynaldo MiniumJuan Fernandez, MD - Gynecology 5. Jetty Duhamellinton Young, MD - Pulmonology 6. Oran ReinLaura Jobe, RD - Diabetes Education  Indicate any recent Medical Services you may have received from other than [facility] providers in the past year (date may be approximate).  All answers were reviewed with the patient and necessary referrals were made:  Jeanine LuzCalone, Gregory, FNP   02/05/2017  Allergies  Allergen Reactions  . Latex Itching and Swelling     Outpatient Medications Prior to Visit  Medication Sig Dispense Refill  . albuterol (PROVENTIL HFA;VENTOLIN HFA) 108 (90 Base) MCG/ACT inhaler 2 puffs every 4-6 hours as directed rescue inhaler 3 Inhaler 3  . Alpha-Lipoic Acid 600 MG CAPS Take 1,800 mg by mouth 2 (two) times daily.    Marland Kitchen azelastine (ASTELIN) 0.1 % nasal spray Place 1 spray into both nostrils 2 (two) times daily. Use in each nostril as directed 30 mL 11  . CYCLOSET 0.8 MG TABS TAKE 3 TABLETS BY MOUTH DAILY IN THE MORNING 270 tablet 0  . Digestive Enzymes (ENZYME DIGEST  PO) Take by mouth.    . fexofenadine (ALLEGRA) 180 MG tablet Take 1 tablet (180 mg total) by mouth daily. 30 tablet prn  . glipiZIDE (GLUCOTROL) 5 MG tablet TAKE ONE TABLET BY MOUTH in am and 2 tablets before dinner 270 tablet 3  . glucose blood (ONE TOUCH ULTRA TEST) test strip USE once a day ad advised. 100 each 3  . irbesartan (AVAPRO) 75 MG tablet TAKE 1 TABLET(75 MG) BY MOUTH DAILY 90 tablet 0  . medroxyPROGESTERone (PROVERA) 10 MG tablet Take one tablet for 10 days of the moth if no spontaneous menses every 35 days 10 tablet 11  . methocarbamol (ROBAXIN) 500 MG tablet Take 1 tablet (500 mg total) by mouth every 8 (eight) hours as needed for muscle spasms. 30 tablet 0  . naphazoline-pheniramine (VISINE-A) 0.025-0.3 % ophthalmic solution Place 1 drop into both eyes 2 (two) times daily.    . naproxen (NAPROSYN) 500 MG tablet Take 1 tablet (500 mg total) by mouth 2 (two) times daily with a meal. 30 tablet 1  . OVER THE COUNTER MEDICATION 1 capsule daily. Probiotic renew life    . Pitavastatin Calcium 2 MG TABS Please take 2 mg daily by mouth. 30 tablet 2  . sertraline (ZOLOFT) 100 MG tablet TAKE 2 TABLETS(200 MG) BY MOUTH DAILY.  Send prescription when due 180 tablet 1  . XULANE 150-35 MCG/24HR transdermal patch UNWRAP AND APPLY 1 PATCH ONTO THE SKIN AND CHANGE WEEKLY CONTINUOUSLY 12 patch 0  . zafirlukast (ACCOLATE) 20 MG tablet Take 1 tablet (20 mg total) by mouth 2 (two) times daily before a meal. 60 tablet 12  . atenolol (TENORMIN) 50 MG tablet TAKE 2 TABLETS BY MOUTH EVERY MORNING AND EVERY EVENING 360 tablet 1  . atenolol (TENORMIN) 50 MG tablet TAKE 2 TABLETS BY MOUTH EVERY MORNING AND EVERY EVENING 120 tablet 0  . CYCLOSET 0.8 MG TABS TAKE 3 TABLETS BY MOUTH DAILY IN THE MORNING 90 tablet 3  . irbesartan (AVAPRO) 75 MG tablet TAKE 1 TABLET(75 MG) BY MOUTH DAILY 90 tablet 2   No facility-administered medications prior to visit.      Past Medical History:  Diagnosis Date  . Anxiety    . Asthma   . Depression   . Diabetes mellitus type II   . Food intolerance   . Headache   . High blood pressure   . IBS (irritable bowel syndrome)   . Insomnia   . Migraines   . PCOS (polycystic ovarian syndrome)   . PTSD (post-traumatic stress disorder)   . Seasonal allergies   . Thyroid disease      Past Surgical History:  Procedure Laterality Date  . CHOLECYSTECTOMY    . GALLBLADDER SURGERY       Family History  Problem Relation Age of Onset  .  Bipolar disorder Mother   . Hypertension Mother   . Diabetes Mother   . Alzheimer's disease Father   . Hypertension Father   . Alzheimer's disease Maternal Grandmother   . Heart disease Paternal Grandfather      Social History   Social History  . Marital status: Single    Spouse name: N/A  . Number of children: 0  . Years of education: 73   Occupational History  . Long Term Disability     PTSD   Social History Main Topics  . Smoking status: Never Smoker  . Smokeless tobacco: Never Used  . Alcohol use 0.0 oz/week    1 Standard drinks or equivalent per week     Comment: 1-2 drinks a month  . Drug use: No  . Sexual activity: Not Currently    Birth control/ protection: Patch   Other Topics Concern  . Not on file   Social History Narrative   Regular exercise: yes, walk   Caffeine use: occasionally tea   Fun: Social group events, walking her dogs, movies   Denies abuse and feels safe where she lives.         Review of Systems  Constitutional: Denies fever, chills, fatigue, or significant weight gain/loss. HENT: Head: Denies headache or neck pain Ears: Denies changes in hearing, ringing in ears, earache, drainage Nose: Denies discharge, stuffiness, itching, nosebleed, sinus pain Throat: Denies sore throat, hoarseness, dry mouth, sores, thrush Eyes: Denies loss/changes in vision, pain, redness, blurry/double vision, flashing lights Cardiovascular: Denies chest pain/discomfort, tightness, palpitations,  shortness of breath with activity, difficulty lying down, swelling, sudden awakening with shortness of breath Respiratory: Denies shortness of breath, cough, sputum production, wheezing Gastrointestinal: Denies dysphasia, heartburn, change in appetite, nausea, change in bowel habits, rectal bleeding, constipation, diarrhea, yellow skin or eyes Genitourinary: Denies frequency, urgency, burning/pain, blood in urine, incontinence, change in urinary strength. Musculoskeletal: Denies muscle/joint pain, stiffness, back pain, redness or swelling of joints, trauma Skin: Denies rashes, lumps, itching, dryness, color changes, or hair/nail changes Neurological: Denies dizziness, fainting, seizures, weakness, numbness, tingling, tremor Psychiatric - Denies nervousness, stress, depression or memory loss Endocrine: Denies heat or cold intolerance, sweating, frequent urination, excessive thirst, changes in appetite Hematologic: Denies ease of bruising or bleeding    Objective:     BP 128/72 (BP Location: Left Arm, Patient Position: Sitting, Cuff Size: Large)   Pulse (!) 54   Temp 98.4 F (36.9 C) (Oral)   Resp 16   Ht 5\' 1"  (1.549 m)   Wt 169 lb (76.7 kg)   SpO2 97%   BMI 31.93 kg/m  Nursing note and vital signs reviewed.  Physical Exam  Constitutional: She is oriented to person, place, and time. She appears well-developed and well-nourished.  HENT:  Head: Normocephalic.  Right Ear: Hearing, tympanic membrane, external ear and ear canal normal.  Left Ear: Hearing, tympanic membrane, external ear and ear canal normal.  Nose: Nose normal.  Mouth/Throat: Uvula is midline, oropharynx is clear and moist and mucous membranes are normal.  Eyes: Conjunctivae and EOM are normal. Pupils are equal, round, and reactive to light.  Neck: Neck supple. No JVD present. No tracheal deviation present. No thyromegaly present.  Cardiovascular: Normal rate, regular rhythm, normal heart sounds and intact distal  pulses.   Pulmonary/Chest: Effort normal and breath sounds normal.  Abdominal: Soft. Bowel sounds are normal. She exhibits no distension and no mass. There is no tenderness. There is no rebound and no guarding.  Musculoskeletal: Normal  range of motion. She exhibits no edema or tenderness.  Lymphadenopathy:    She has no cervical adenopathy.  Neurological: She is alert and oriented to person, place, and time. She has normal reflexes. No cranial nerve deficit. She exhibits normal muscle tone. Coordination normal.  Skin: Skin is warm and dry.  Psychiatric: She has a normal mood and affect. Her behavior is normal. Judgment and thought content normal.       Assessment & Plan:   During the course of the visit the patient was educated and counseled about appropriate screening and preventive services including:    Pneumococcal vaccine   Diabetes screening  Glaucoma screening  Nutrition counseling   Diet review for nutrition referral? Yes ____  Not Indicated _X___   Patient Instructions (the written plan) was given to the patient.  Medicare Attestation I have personally reviewed: The patient's medical and social history Their use of alcohol, tobacco or illicit drugs Their current medications and supplements The patient's functional ability including ADLs,fall risks, home safety risks, cognitive, and hearing and visual impairment Diet and physical activities Evidence for depression or mood disorders  The patient's weight, height, BMI,  have been recorded in the chart.  I have made referrals, counseling, and provided education to the patient based on review of the above and I have provided the patient with a written personalized care plan for preventive services.     Problem List Items Addressed This Visit      Cardiovascular and Mediastinum   Essential hypertension    Blood pressure appears adequately controlled with current medication regimen and no adverse side effects. No new  symptoms of end organ damage noted on physical exam. Continue to monitor blood pressure at home and follow sodium diet.        Respiratory   Asthma, mild persistent    Stable with no current symptoms of exacerbation. Continue to monitor.        Endocrine   Controlled type 2 diabetes mellitus without complication, without long-term current use of insulin (HCC)    Most recent A1c was adequate diabetes control and no hypoglycemic readings or adverse side effects. Continue monitor blood sugar at home with changes and follow-up per endocrinology.        Other   Severe episode of recurrent major depressive disorder (HCC)    Stable with current regimen and no adverse side effects or suicidal ideations. Follow up and changes per psychiatry.       Hyperlipidemia    Stable with current medication regimen and no adverse side effects or myalgias. Continue current dosage of pitavastatin.       Medicare annual wellness visit, subsequent - Primary    Reviewed and updated patient's medical, surgical, family and social history. Medications and allergies were also reviewed. Basic screenings for depression, activities of daily living, hearing, cognition and safety were performed. Provider list was updated and health plan was provided to the patient.   Overall well exam with chronic conditions appearing adequately managed. Pneumovax updated today. Declines mammogram. Has cervical cancer screening with gynecology. Encouraged to continue walking and consuming a moderate, balance nutritional intake. Continue other healthy lifestyle behaviors and choices. Follow up prevention exam in 1 year. Follow up office visit for chronic conditions.       Relevant Orders   CBC   Comprehensive metabolic panel   Lipid panel    Other Visit Diagnoses    Need for 23-polyvalent pneumococcal polysaccharide vaccine  Relevant Orders   Pneumococcal polysaccharide vaccine 23-valent greater than or equal to 2yo  subcutaneous/IM (Completed)       I have discontinued Ms. Yale's atenolol and atenolol. I am also having her maintain her OVER THE COUNTER MEDICATION, fexofenadine, naphazoline-pheniramine, naproxen, albuterol, methocarbamol, medroxyPROGESTERone, zafirlukast, azelastine, glucose blood, glipiZIDE, Digestive Enzymes (ENZYME DIGEST PO), Alpha-Lipoic Acid, Pitavastatin Calcium, CYCLOSET, XULANE, irbesartan, and sertraline.   No orders of the defined types were placed in this encounter.    Follow-up: Return in about 3 months (around 05/08/2017), or if symptoms worsen or fail to improve.   Jeanine Luz, FNP

## 2017-02-12 ENCOUNTER — Other Ambulatory Visit: Payer: Self-pay | Admitting: Internal Medicine

## 2017-02-15 DIAGNOSIS — R87618 Other abnormal cytological findings on specimens from cervix uteri: Secondary | ICD-10-CM

## 2017-02-15 HISTORY — DX: Other abnormal cytological findings on specimens from cervix uteri: R87.618

## 2017-02-22 ENCOUNTER — Ambulatory Visit (INDEPENDENT_AMBULATORY_CARE_PROVIDER_SITE_OTHER): Payer: PPO | Admitting: Gynecology

## 2017-02-22 ENCOUNTER — Other Ambulatory Visit (INDEPENDENT_AMBULATORY_CARE_PROVIDER_SITE_OTHER): Payer: PPO

## 2017-02-22 ENCOUNTER — Encounter: Payer: Self-pay | Admitting: Gynecology

## 2017-02-22 VITALS — BP 118/78 | Ht 61.0 in | Wt 166.0 lb

## 2017-02-22 DIAGNOSIS — Z01419 Encounter for gynecological examination (general) (routine) without abnormal findings: Secondary | ICD-10-CM

## 2017-02-22 DIAGNOSIS — Z0000 Encounter for general adult medical examination without abnormal findings: Secondary | ICD-10-CM

## 2017-02-22 LAB — LIPID PANEL
CHOL/HDL RATIO: 3
CHOLESTEROL: 282 mg/dL — AB (ref 0–200)
HDL: 101.3 mg/dL (ref >39.00)
LDL CALC: 153 mg/dL — AB (ref 0–99)
NonHDL: 180.43
TRIGLYCERIDES: 137 mg/dL (ref 0.0–149.0)
VLDL: 27.4 mg/dL (ref 0.0–40.0)

## 2017-02-22 LAB — CBC
HEMATOCRIT: 36.3 % (ref 36.0–46.0)
Hemoglobin: 12 g/dL (ref 12.0–15.0)
MCHC: 33 g/dL (ref 30.0–36.0)
MCV: 86.2 fl (ref 78.0–100.0)
Platelets: 303 10*3/uL (ref 150.0–400.0)
RBC: 4.21 Mil/uL (ref 3.87–5.11)
RDW: 14.1 % (ref 11.5–15.5)
WBC: 12.3 10*3/uL — ABNORMAL HIGH (ref 4.0–10.5)

## 2017-02-22 LAB — COMPREHENSIVE METABOLIC PANEL
ALT: 16 U/L (ref 0–35)
AST: 17 U/L (ref 0–37)
Albumin: 4 g/dL (ref 3.5–5.2)
Alkaline Phosphatase: 55 U/L (ref 39–117)
BUN: 22 mg/dL (ref 6–23)
CALCIUM: 9.7 mg/dL (ref 8.4–10.5)
CHLORIDE: 101 meq/L (ref 96–112)
CO2: 27 meq/L (ref 19–32)
Creatinine, Ser: 0.86 mg/dL (ref 0.40–1.20)
GFR: 74.48 mL/min (ref >60.00)
Glucose, Bld: 166 mg/dL — ABNORMAL HIGH (ref 70–99)
POTASSIUM: 4.6 meq/L (ref 3.5–5.1)
SODIUM: 136 meq/L (ref 135–145)
Total Bilirubin: 0.3 mg/dL (ref 0.2–1.2)
Total Protein: 7.7 g/dL (ref 6.0–8.3)
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
## 2017-02-24 ENCOUNTER — Encounter: Payer: Self-pay | Admitting: Family

## 2017-02-26 LAB — PAP, TP IMAGING W/ HPV RNA, RFLX HPV TYPE 16,18/45: HPV mRNA, High Risk: DETECTED — AB

## 2017-02-28 ENCOUNTER — Encounter: Payer: Self-pay | Admitting: Gynecology

## 2017-02-28 LAB — HPV TYPE 16 AND 18/45 RNA
HPV TYPE 18/45 RNA: NOT DETECTED
HPV Type 16 RNA: NOT DETECTED

## 2017-03-10 ENCOUNTER — Other Ambulatory Visit: Payer: Self-pay | Admitting: Family

## 2017-03-13 ENCOUNTER — Encounter: Payer: Self-pay | Admitting: Gynecology

## 2017-03-13 ENCOUNTER — Ambulatory Visit (INDEPENDENT_AMBULATORY_CARE_PROVIDER_SITE_OTHER): Payer: PPO | Admitting: Gynecology

## 2017-03-13 VITALS — BP 120/82

## 2017-03-13 DIAGNOSIS — R87810 Cervical high risk human papillomavirus (HPV) DNA test positive: Secondary | ICD-10-CM

## 2017-03-13 DIAGNOSIS — R87610 Atypical squamous cells of undetermined significance on cytologic smear of cervix (ASC-US): Secondary | ICD-10-CM | POA: Insufficient documentation

## 2017-03-13 MED ORDER — LIDOCAINE 2 % EX GEL: 1.0000 | Freq: Once | CUTANEOUS | 3 refills | Status: DC | PRN

## 2017-03-13 NOTE — Progress Notes (Signed)
   Patient is a 51 year old was seen the office for her annual exam on 02/22/2017. See previous note for details. She is here because her Pap smear demonstrated positive HPV but normal cytology. HPV virus 16, 18 and 45 were negative. We discussed the new ASC CPK guidelines whether to repeat the Pap smear with co testing in one year or proceed with colposcopy today. Patient has decided to proceed with colposcopy. The risks benefits and pros and cons of the procedure were discussed and what to expect during and after the procedure. Her partner was present all questions are answered.  Patient underwent a detail colposcopic evaluation external genitalia, perineum, perirectal region. A small speculum was introduced into the vagina. A systematic inspection vagina and for next and not demonstrating any lesion. Acetic acid was applied to the cervix and Sigel white area was noted at the 12 and 6:00 position. Endocervical speculum was utilized and the transformation son was visualized. Cervical biopsies were obtained for both these areas along with an ECC. Silver nitrate and Monsel solution was used for hemostasis. Patient felt uncomfortable during the procedure and she was reassured that everything was well and she was offered an Aleve to take for cramping after procedure.  The following outlines the finding on colposcopic evaluation:  Physical Exam  Genitourinary:     Assessment/plan: Patient with Pap smear demonstrating normal cytology but high-risk HPV. HPV subtype 16, 18, and 45 were negative. Colposcopic evaluation with 2 acetowhite areas noted 12 and 6:00 positionsc biopsy. Highly suspicious for HPV virus changes but no dysplasia will await pathology report. Transformation zone was visualized entirely and an ECC was obtained as well. Patient suffers from painful intercourse she not been sexually active for quite some time and has a new partner now. I offered her several options either to prescribe her  vaginal dilators to gradually increase or that she can by a different size candles and use at home and change the size weekly she has decided to proceed with a candles instead. I'm also going to recommend she use lubricant during intercourse and also at time she could use some 2% lidocaine gel to apply to external genitalia and around his personal genital area to help with the discomfort.

## 2017-03-13 NOTE — Patient Instructions (Signed)
Colposcopy, Care After  This sheet gives you information about how to care for yourself after your procedure. Your doctor may also give you more specific instructions. If you have problems or questions, contact your doctor.  What can I expect after the procedure?  If you did not have a tissue sample removed (did not have a biopsy), you may only have some spotting for a few days. You can go back to your normal activities.  If you had a tissue sample removed, it is common to have:  · Soreness and pain. This may last for a few days.  · Light-headedness.  · Mild bleeding from your vagina or dark-colored, grainy discharge from your vagina. This may last for a few days. You may need to wear a sanitary pad.  · Spotting for at least 48 hours after the procedure.    Follow these instructions at home:  · Take over-the-counter and prescription medicines only as told by your doctor. Ask your doctor what medicines you can start taking again. This is very important if you take blood-thinning medicine.  · Do not drive or use heavy machinery while taking prescription pain medicine.  · For 3 days, or as long as your doctor tells you, avoid:  ? Douching.  ? Using tampons.  ? Having sex.  · If you use birth control (contraception), keep using it.  · Limit activity for the first day after the procedure. Ask your doctor what activities are safe for you.  · It is up to you to get the results of your procedure. Ask your doctor when your results will be ready.  · Keep all follow-up visits as told by your doctor. This is important.  Contact a doctor if:  · You get a skin rash.  Get help right away if:  · You are bleeding a lot from your vagina. It is a lot of bleeding if you are using more than one pad an hour for 2 hours in a row.  · You have clumps of blood (blood clots) coming from your vagina.  · You have a fever.  · You have chills  · You have pain in your lower belly (pelvic area).  · You have signs of infection, such as vaginal  discharge that is:  ? Different than usual.  ? Yellow.  ? Bad-smelling.  · You have very pain or cramps in your lower belly that do not get better with medicine.  · You feel light-headed.  · You feel dizzy.  · You pass out (faint).  Summary  · If you did not have a tissue sample removed (did not have a biopsy), you may only have some spotting for a few days. You can go back to your normal activities.  · If you had a tissue sample removed, it is common to have mild pain and spotting for 48 hours.  · For 3 days, or as long as your doctor tells you, avoid douching, using tampons and having sex.  · Get help right away if you have bleeding, very bad pain, or signs of infection.  This information is not intended to replace advice given to you by your health care provider. Make sure you discuss any questions you have with your health care provider.  Document Released: 02/20/2008 Document Revised: 05/23/2016 Document Reviewed: 05/23/2016  [employer] Interactive Patient Education © 2018 [employer]

## 2017-03-21 ENCOUNTER — Other Ambulatory Visit: Payer: Self-pay | Admitting: Family

## 2017-03-25 ENCOUNTER — Encounter: Payer: Self-pay | Admitting: Family

## 2017-03-25 ENCOUNTER — Other Ambulatory Visit: Payer: Self-pay | Admitting: Family

## 2017-03-25 DIAGNOSIS — I10 Essential (primary) hypertension: Secondary | ICD-10-CM

## 2017-03-25 DIAGNOSIS — H524 Presbyopia: Secondary | ICD-10-CM | POA: Diagnosis not present

## 2017-03-25 DIAGNOSIS — E119 Type 2 diabetes mellitus without complications: Secondary | ICD-10-CM | POA: Diagnosis not present

## 2017-03-25 LAB — HM DIABETES EYE EXAM

## 2017-04-11 ENCOUNTER — Encounter: Payer: Self-pay | Admitting: Internal Medicine

## 2017-04-11 ENCOUNTER — Ambulatory Visit (INDEPENDENT_AMBULATORY_CARE_PROVIDER_SITE_OTHER): Payer: PPO | Admitting: Internal Medicine

## 2017-04-11 VITALS — BP 118/60 | HR 54 | Ht 60.5 in | Wt 164.0 lb

## 2017-04-11 DIAGNOSIS — E119 Type 2 diabetes mellitus without complications: Secondary | ICD-10-CM

## 2017-04-11 DIAGNOSIS — E7849 Other hyperlipidemia: Secondary | ICD-10-CM

## 2017-04-11 DIAGNOSIS — E784 Other hyperlipidemia: Secondary | ICD-10-CM

## 2017-04-11 LAB — POCT GLYCOSYLATED HEMOGLOBIN (HGB A1C): HEMOGLOBIN A1C: 7.7

## 2017-04-11 MED ORDER — COLESEVELAM HCL 625 MG PO TABS: 1250.0000 mg | ORAL_TABLET | Freq: Two times a day (BID) | ORAL | 5 refills | Status: DC

## 2017-04-11 MED ORDER — GLUCOSE BLOOD VI STRP: ORAL_STRIP | 3 refills | Status: DC

## 2017-04-11 NOTE — Patient Instructions (Addendum)
Please continue: - Cycloset 2.4 mg daily in am - Glipizide 5 mg in am and 10 mg before dinner.  Try to add: - Welchol 2 tablets 2x a day and see if you can increase to 3 tablets 2x a day  Also continue: - Livalo 2 mg  - try to take this daily  Please return in 3 months with your sugar log.

## 2017-04-11 NOTE — Progress Notes (Signed)
Patient ID: Abigail CoveSara E Bechard, female   DOB: June 02, 1966, 51 y.o.   MRN: [id number]  HPI: [name] is a 51 y.o.-year-old female, returning for f/u for DM2, dx 2013, non-insulin-dependent, well controlled, without complications. Last visit 6 months ago.  She saw the nutritionist in 10/2016.   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  Last hemoglobin A1c was: Lab Results  Component Value Date   HGBA1C 7.1 09/27/2016   HGBA1C 7.0 06/26/2016   HGBA1C 6.9 03/28/2016   Pt is on a regimen of: - Cycloset 2.4 mg daily (increased in 06/2014)  - Glipizide 5 mg 2x a day >> 5 mg in am and 10 mg in pm before meals - ended up using 4 tabs a day. On Turmeric, alpha-lipoic acid, Garlicin.  She had various intolerances to different diabetes medications:  Metformin ER >> gastric discomfort and severe diarrhea (loss of bowel control).   Januvia >> tried for 1-2 months >> left chest pain  She was wondering if she could take Invokana, however she had multiple vaginal yeast infections. She Also had nocturia 3-4 x a night.   She had reactive hypoglycemia symptoms while on Quercetin (was taking it for allergies) >> we stopped this it is known to increase insulin production by the pancreas.  She checks sugars sporadically, once a day: - am: 146-193 >> 161 >> 105, 129, 165-232, 300 >> 108-171, 295 (had 3 bags of sweets) - 1h after b'fast: 207 (after banana bread) >> n/c - before lunch: 117, 119 >> 146-169, 255 >> n/c >> 148 >> 145-193 (at waking up) - 2h after lunch: 187 >> n/c >> 172 - bedtime: 153 (after snack) >> 142-223 >> 153, 239 >> n/c - nighttime: 112-157 >> n/c >> 151-270, 338 >> 104, 164-214  Has many food intolerances: - wheat >> cannot eat bread or pastry.  - raw vegetables, beans, cruciferous vegetables, fruit >> diarrhea.  - No dairy - She does not digest fat well after her cholecystectomy in 2011.   - No CKD, last  BUN/creatinine was:  Lab Results  Component Value Date   BUN 22 02/22/2017   CREATININE 0.86 02/22/2017  On Irbesartan.  - She has a history of hyperlipidemia - Last set of lipids better, but still with elevated LDL.  She was on Questran for diarrhea >> stopped. We started Livalo 2 at last visit. She continues Benecol spread. Lab Results  Component Value Date   CHOL 282 (H) 02/22/2017   HDL 101.30 02/22/2017   LDLCALC 153 (H) 02/22/2017   LDLDIRECT 205.0 01/24/2016   TRIG 137.0 02/22/2017   CHOLHDL 3 02/22/2017  Previous cholesterol levels were: 325/197/132/154.   - last dilated eye exam was in 03/25/2017 (Dr Randon GoldsmithLyles) - no DR.  - She does have numbness and tingling in her legs. She is on R enantiomer of alpha lipoic acid.  She also has a history of PCOS - [facility] - note from 08/26/2012: perimenopause, and at that time she was on Yasmin, subsequently changed to Necon. She sees Dr. Marvel PlanMichelle Grewall with OB/GYN. She has PTSD, MDD, insomnia, anxiety, asthma, HTN, GERD, status post cholecystectomy, anemia, menorrhagia, improved a little on new OCP, transaminitis. She also had increased uterine bleeding (on Necon). She Is seeing an acupuncturist. She takes Nettle powder >> helped her with diarrhea, anemia, insomnia. Last TSH was normal: Lab Results  Component Value  Date   TSH 2.91 01/24/2016   ROS: Constitutional: no weight gain/no weight loss, no fatigue, no subjective hyperthermia, no subjective hypothermia Eyes: no blurry vision, no xerophthalmia ENT: no sore throat, no nodules palpated in throat, no dysphagia, no odynophagia, no hoarseness Cardiovascular: no CP/no SOB/no palpitations/no leg swelling Respiratory: no cough/no SOB/no wheezing Gastrointestinal: no N/no V/no D/no C/no acid reflux Musculoskeletal: no muscle aches/no joint aches Skin: no rashes, no hair loss Neurological: no tremors//no dizziness  I reviewed pt's medications,  allergies, PMH, social hx, family hx, and changes were documented in the history of present illness. Otherwise, unchanged from my initial visit note.  PE: BP 118/60 (BP Location: Left Arm, Patient Position: Sitting)   Pulse (!) 54   Ht 5' 0.5" (1.537 m)   Wt 164 lb (74.4 kg)   SpO2 97%   BMI 31.50 kg/m  Body mass index is 31.5 kg/m.  Wt Readings from Last 3 Encounters:  04/11/17 164 lb (74.4 kg)  02/22/17 166 lb (75.3 kg)  02/05/17 169 lb (76.7 kg)   Constitutional: overweight, in NAD Eyes: PERRLA, EOMI, no exophthalmos ENT: moist mucous membranes, no thyromegaly, no cervical lymphadenopathy Cardiovascular: RRR, No MRG Respiratory: CTA B Gastrointestinal: abdomen soft, NT, ND, BS+ Musculoskeletal: no deformities, strength intact in all 4 Skin: moist, warm, no rashes Neurological: no tremor with outstretched hands, DTR normal in all 4  ASSESSMENT: 1. DM2, non-insulin-dependent, fairly well controlled, without complications  2. HL - Very high LDL - high HDL - slightly high TG  PLAN:  1. DM2 - Patient with Previously controlled DM2, now worse. She seems surprised that her HbA1c is higher today, at 7.7%. However, the sugars in her meter range from low 100s to almost 300s. She does mention that she does binge eat when stressed. She now has to move out of her apartment and she is more stressed. She did see the nutritionist 5 months ago. - I suggested to add WelChol since she has several intolerances to diabetic meds - metformin (even extended release), Januvia, glipizide. We cannot use Invokana due to h/o UTIs, and her nocturia.  We'll continue Cycloset than glipizide. At next visit, if sugars not better, may need to add insulin. - I advised her to:  Patient Instructions  Please continue: - Cycloset 2.4 mg daily in am - Glipizide 5 mg in am and 10 mg before dinner.  Try to add: - Welchol 2 tablets 2x a day and see if you can increase to 3 tablets 2x a day  Also continue: -  Livalo 2 mg  - try to take this daily  Please return in 3 months with your sugar log.     - today, HbA1c is 7.7% (higher) - continue checking sugars at different times of the day - check 1x a day, rotating checks - advised for yearly eye exams >> she is UTD - Return to clinic in 3 mo with sugar log   2. HL - We reviewed together her latest lipid panel. Her LDL was very high before, 295200, and it has decreased to 150s at last check 1.5 months ago. We did start Livalo at last visit which she is now taking every 2 weeks and working towards taking it every week - I advised her to try to move it even closer together - Starting WelChol should also help but we did discuss that this may increase her triglycerides  Carlus Pavlovristina Ruford Dudzinski, MD PhD [facility]

## 2017-04-15 ENCOUNTER — Telehealth: Payer: Self-pay

## 2017-04-15 NOTE — Telephone Encounter (Signed)
Called and left voicemail advising patient to come pick up the coupon card for welchol to go through on her insurance. Advised patient I was leaving it up front with her name on it so come pick it up. Left call back number if any questions.

## 2017-05-01 ENCOUNTER — Other Ambulatory Visit: Payer: Self-pay

## 2017-05-01 MED ORDER — ATENOLOL 50 MG PO TABS: ORAL_TABLET | ORAL | 0 refills | Status: DC

## 2017-05-02 ENCOUNTER — Other Ambulatory Visit: Payer: Self-pay | Admitting: Family

## 2017-05-08 ENCOUNTER — Other Ambulatory Visit: Payer: Self-pay | Admitting: Internal Medicine

## 2017-06-09 ENCOUNTER — Other Ambulatory Visit: Payer: Self-pay | Admitting: Family

## 2017-06-10 NOTE — Telephone Encounter (Signed)
Plz advise to sched appt first/was advised to F/U in Aug/thx dmf

## 2017-06-21 ENCOUNTER — Telehealth: Payer: Self-pay | Admitting: *Deleted

## 2017-06-21 ENCOUNTER — Other Ambulatory Visit: Payer: Self-pay | Admitting: Family

## 2017-06-21 DIAGNOSIS — I10 Essential (primary) hypertension: Secondary | ICD-10-CM

## 2017-06-21 NOTE — Telephone Encounter (Signed)
Patient will continue care with you, her PCP prescribed Xulane 150-30 mg patch but has now retired. Pt would like you to continue refills, patient said directions must say "unwrap and apply 1 patch onto the skin and change weekly continuously". Patient had annual in June 2018

## 2017-06-23 NOTE — Telephone Encounter (Signed)
Can prescribe Xulane patch continuously until 02/2018.

## 2017-06-24 ENCOUNTER — Telehealth: Payer: Self-pay | Admitting: Internal Medicine

## 2017-06-24 ENCOUNTER — Other Ambulatory Visit: Payer: Self-pay | Admitting: Family

## 2017-06-24 MED ORDER — NORELGESTROMIN-ETH ESTRADIOL 150-35 MCG/24HR TD PTWK: MEDICATED_PATCH | TRANSDERMAL | 2 refills | Status: DC

## 2017-06-24 NOTE — Telephone Encounter (Signed)
Left message for patient stating that we were working on getting her scheduled with CY.   Looked at CY's schedule. He does have two RNA spots open on 07/09/17 at 1130 and 07/10/17 at 1130.   [name], is it ok to schedule her during these times? Please advise. Thanks!

## 2017-06-24 NOTE — Telephone Encounter (Signed)
Left on voicemail Rx sent 

## 2017-06-24 NOTE — Telephone Encounter (Signed)
Noted. Will wait for patient to call back to get her scheduled.

## 2017-06-24 NOTE — Telephone Encounter (Signed)
Yes either RNA slot is fine to use unless held per me for another patient. Thanks.

## 2017-06-25 NOTE — Telephone Encounter (Signed)
Called and spoke with pt and she is aware of appt with CY. Nothing further is needed.

## 2017-07-03 ENCOUNTER — Ambulatory Visit (HOSPITAL_COMMUNITY): Payer: Self-pay | Admitting: Psychiatry

## 2017-07-09 ENCOUNTER — Encounter: Payer: Self-pay | Admitting: Internal Medicine

## 2017-07-09 ENCOUNTER — Ambulatory Visit (INDEPENDENT_AMBULATORY_CARE_PROVIDER_SITE_OTHER): Payer: PPO | Admitting: Internal Medicine

## 2017-07-09 DIAGNOSIS — Z23 Encounter for immunization: Secondary | ICD-10-CM | POA: Diagnosis not present

## 2017-07-09 DIAGNOSIS — J4530 Mild persistent asthma, uncomplicated: Secondary | ICD-10-CM | POA: Diagnosis not present

## 2017-07-09 DIAGNOSIS — J3089 Other allergic rhinitis: Secondary | ICD-10-CM

## 2017-07-09 MED ORDER — ALBUTEROL SULFATE HFA 108 (90 BASE) MCG/ACT IN AERS: INHALATION_SPRAY | RESPIRATORY_TRACT | 3 refills | Status: DC

## 2017-07-09 NOTE — Patient Instructions (Signed)
Flu vax- standard  Script sent for albuterol rescue inhaler   Emphasize avoidance of over drying your nose- suggest nasal saline rinse, spray and gel as needed  I suggest you establish with a full-time allergy office as discussed, for a review of other treatment options.

## 2017-07-09 NOTE — Assessment & Plan Note (Signed)
She describes perennial symptoms, worse in the winter. She has made effort to control dust in her home, including installing filters and removing carpet, but she keeps her dogs and cat. She has a list of common medications which have not worked for her, mainly from [employer] and overdrying causing epistaxis. Singulair caused nightmares. Plan-I encouraged saline nasal rinse, nasal saline  spray or saline gel. I recommended she see an allergist for broader evaluation and options.

## 2017-07-09 NOTE — Assessment & Plan Note (Signed)
No significant exacerbations. She uses rescue inhaler about twice a week with little sleep disturbance, no need for steroids. I don't think she needs a maintenance controller unless her rescue inhaler use increases. Plan-flu vaccine, refill albuterol HFA

## 2017-07-09 NOTE — Progress Notes (Signed)
HPI  female never smoker followed for allergic rhinitis, allergic conjunctivitis, food intolerance/irritable bowel, complicated by DM, HBP, depression Multiple food intolerances-peanuts, citrus, dairy, weak/gluten-all causing irritable bowel symptoms. She says celiac disease was ruled out. Intolerance to latex but not contrast dye or aspirin. Allergy profile 11/06/2013-total IgE 651.9, broadly positive especially for cat She intends to keep her cats and dogs Office spirometry 03/15/2015-within normal limits. FVC 2.47/82%, FEV1 1.93/77%, FEV1/FVC 78%, FEF 25-75 percent 1.95/67%.  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 Lives in a townhouse with air filters and and encasings on bedding. CAC. 2 dogs, 1 cat, no mold. Brother had anaphylactic reaction from yellow jacket sting. Both parents have "allergies" ----------------------------------------------------------------------------------- 03/15/2016-51 year old female never smoker followed for allergic rhinitis, allergic conjunctivitis, food intolerance/irritable bowel, complicated by DM, HBP, depression, GERD FOLLOW FOR: doing okay, some trouble with weather change.  no specific concerns.  Flonase causes nose bleeds. Nasalcrom insufficient. Singulair caused nightmares. Rare need for rescue inhaler, once or twice a week. Denies using Afrin  07/09/17- 51 year old female never smoker followed for allergic rhinitis, allergic conjunctivitis, food intolerance/irritable bowel, complicated by DM2, HBP, depression --Asthma; Pt states none of the allergy medications are working for her -the only medication that works is the eye drops. Nose bleeds were caused by the nasal sprays.  Pt is having SOB, wheezing, sneezing and  sinus pressure Albuterol HFA averaging twice a week. Singulair caused nightmares. Boyfriend says she does not snore significantly. She wakes in the morning with stuffy nose and drainage. Allegra caused pupil dilatation. Azelastine nasal spray  blamed for nosebleeds. Heart murmur, so she avoids stimulants/decongestants.  Symptoms are perennial but she expects usually to be better in the winter. She keeps nasal saline and nasal saline gel, as well as rescue inhalers around her home.  ROS-see HPI  + = positive Constitutional:   No-   weight loss, night sweats, fevers, chills, fatigue, lassitude. HEENT:   +  headaches, +difficulty swallowing, tooth/dental problems, sore throat,       No-  +sneezing,+ itching, no-ear ache,+nasal congestion, +post nasal drip,  CV:  No-   chest pain, orthopnea, PND, swelling in lower extremities, anasarca,                                                       dizziness, palpitations Resp: +shortness of breath with exertion or at rest.              No-   productive cough,  No non-productive cough,  No- coughing up of blood.              No-   change in color of mucus.  + wheezing.   Skin: No-   rash or lesions. GI:  +heartburn, indigestion, +abdominal pain, nausea, vomiting,  GU:  MS: + joint pain or swelling.   Neuro-     nothing unusual Psych:  No- change in mood or affect. No depression or anxiety.  No memory loss.  OBJ- Physical Exam General- Alert, Oriented, Affect-appropriate, Distress- none acute Skin- rash-none, lesions- none, excoriation- none Lymphadenopathy- none Head- atraumatic            Eyes- Gross vision intact, PERRLA, conjunctivae and secretions clear            Ears- Hearing, canals-normal  Nose- No sniffing, mucosa lclear, no-Septal dev, no mucus bridging, polyps, erosion, perforation             Throat- Mallampati III , mucosa clear , drainage- none, tonsils- atrophic Neck- flexible , trachea midline, no stridor , thyroid nl, carotid no bruit Chest - symmetrical excursion , unlabored           Heart/CV- RRR , +1/6 upper sternal murmur , no gallop  , no rub, nl s1 s2                           - JVD- none , edema- none, stasis changes- none, varices- none            Lung- clear to P&A, wheeze- none, cough- none , dullness-none, rub- none           Chest wall-  Abd-  Br/ Gen/ Rectal- Not done, not indicated Extrem- cyanosis- none, clubbing, none, atrophy- none, strength- nl Neuro- grossly intact to observation

## 2017-07-12 ENCOUNTER — Ambulatory Visit (INDEPENDENT_AMBULATORY_CARE_PROVIDER_SITE_OTHER): Payer: PPO | Admitting: Internal Medicine

## 2017-07-12 ENCOUNTER — Encounter: Payer: Self-pay | Admitting: Internal Medicine

## 2017-07-12 VITALS — BP 128/80 | HR 59 | Wt 166.0 lb

## 2017-07-12 DIAGNOSIS — E1165 Type 2 diabetes mellitus with hyperglycemia: Secondary | ICD-10-CM

## 2017-07-12 LAB — POCT GLYCOSYLATED HEMOGLOBIN (HGB A1C): HEMOGLOBIN A1C: 6.6

## 2017-07-12 NOTE — Addendum Note (Signed)
Addended by: Darene LamerHOMPSON, Granvel Proudfoot T on: 07/12/2017 02:39 PM   Modules accepted: Orders

## 2017-07-12 NOTE — Patient Instructions (Addendum)
Please continue: - Cycloset 2.4 mg daily in am - Glipizide 5 mg in am,  5-10 mg before lunch, and 10 mg before dinner  Continue: - Livalo 2 mg - take it daily  Please return in 3-4 months with your sugar log.

## 2017-07-12 NOTE — Progress Notes (Signed)
Patient ID: [name], female   DOB: 07-15-1966, 51 y.o.   MRN: [id number]  HPI: [name] is a 51 y.o.-year-old female, returning for f/u for DM2, dx 2013, non-insulin-dependent, recently uncontrolled, without complications. Last visit 3 months ago.  Last hemoglobin A1c was: Lab Results  Component Value Date   HGBA1C 7.7 04/11/2017   HGBA1C 7.1 09/27/2016   HGBA1C 7.0 06/26/2016   Pt is on a regimen of: - Cycloset 2.4 mg daily (increased in 06/2014)  - Glipizide 5 mg 2x a day >> 5 mg in am and 10 mg before L and 10 mg before dinner She did not start Welchol 2 tabs 2x a day - added 03/2017 - too expensive On Turmeric, alpha-lipoic acid, Garlicin.  She had various intolerances to different diabetes medications:  Metformin ER >> gastric discomfort and severe diarrhea (loss of bowel control).   Januvia >> tried for 1-2 months >> left chest pain  She was wondering if she could take Invokana, however she had multiple vaginal yeast infections. She Also had nocturia 3-4 x a night.   She had reactive hypoglycemia symptoms while on Quercetin (was taking it for allergies) >> we stopped this it is known to increase insulin production by the pancreas.  She did not check sugars since last vist - am: 105, 129, 165-232, 300 >> 108-171, 295 (had 3 bags of sweets)  - 1h after b'fast: 207 (after banana bread) >> n/c - before lunch: 1146-169, 255 >> n/c >> 148 >> 145-193 (at waking up) - 2h after lunch: 187 >> n/c >> 172 - bedtime: 153 (after snack) >> 142-223 >> 153, 239 >> n/c - nighttime: 112-157 >> n/c >> 151-270, 338 >> 104, 164-214  Has many food intolerances: - wheat >> cannot eat bread or pastry.  - raw vegetables, beans, cruciferous vegetables, fruit >> diarrhea.  - No dairy - She does not digest fat well after her cholecystectomy in 2011.   - No CKD, last BUN/creatinine was:  Lab Results  Component Value Date   BUN 22 02/22/2017   CREATININE 0.86 02/22/2017  On  Irbesartan.  - + HL; latest set of lipids better, but still with elevated LDL.  She was on Questran for diarrhea >> stopped. We started Livalo and she continues Benechol spread. Lab Results  Component Value Date   CHOL 282 (H) 02/22/2017   HDL 101.30 02/22/2017   LDLCALC 153 (H) 02/22/2017   LDLDIRECT 205.0 01/24/2016   TRIG 137.0 02/22/2017   CHOLHDL 3 02/22/2017  Previous cholesterol levels were: 325/197/132/154.   - last dilated eye exam was in 03/2017 >> No DR (Dr Randon GoldsmithLyles)   - + numbness and tingling in her legs. She is on R enantiomer of alpha lipoic acid.  She also has a history of PCOS - [facility] - note from 08/26/2012: perimenopause, and at that time she was on Yasmin, subsequently changed to Necon. She sees Dr. Marvel PlanMichelle Grewall with OB/GYN. She has PTSD, MDD, insomnia, anxiety, asthma, HTN, GERD, status post cholecystectomy, anemia, menorrhagia, improved a little on new OCP, transaminitis. She also had increased uterine bleeding (on Necon). She Is seeing an acupuncturist. She takes Nettle powder >> helped her with diarrhea, anemia, insomnia. 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 Last TSH was normal: Lab Results  Component Value Date   TSH 2.91 01/24/2016   ROS: Constitutional: no weight gain/no weight loss,  no fatigue, no subjective hyperthermia, no subjective hypothermia, + nocturia Eyes: no blurry vision, no xerophthalmia ENT: no sore throat, no nodules palpated in throat, no dysphagia, no odynophagia, no hoarseness Cardiovascular: no CP/+ SOB/no palpitations/no leg swelling Respiratory: no cough/+ SOB/no wheezing Gastrointestinal: no N/no V/no D/no C/no acid reflux Musculoskeletal: no muscle aches/no joint aches Skin: no rashes, no hair loss Neurological: no tremors/+ numbness/+ tingling/no dizziness, + HA  I reviewed pt's medications, allergies, PMH, social hx, family hx, and changes  were documented in the history of present illness. Otherwise, unchanged from my initial visit note. Started Allegra.    PE: BP 128/80 (BP Location: Left Arm, Patient Position: Sitting)   Pulse (!) 59   Wt 166 lb (75.3 kg)   SpO2 97%   BMI 31.37 kg/m  Body mass index is 31.37 kg/m.  Wt Readings from Last 3 Encounters:  07/12/17 166 lb (75.3 kg)  07/09/17 168 lb 12.8 oz (76.6 kg)  04/11/17 164 lb (74.4 kg)   Constitutional: overweight, in NAD Eyes: PERRLA, EOMI, no exophthalmos ENT: moist mucous membranes, no thyromegaly, no cervical lymphadenopathy Cardiovascular: RRR, No MRG Respiratory: CTA B Gastrointestinal: abdomen soft, NT, ND, BS+ Musculoskeletal: no deformities, strength intact in all 4 Skin: moist, warm, no rashes Neurological: no tremor with outstretched hands, DTR normal in all 4  ASSESSMENT: 1. DM2, non-insulin-dependent, fairly well controlled, without long term complications, but with hyperglycemia  2. HL - Very high LDL - high HDL - slightly high TG  PLAN:  1. DM2 - Patient with previously controlled DM2, worsened in last year. She is binge eating when stressed. She did see nutrition in the past. - as she has several med intolerances [metformin (even extended release), Januvia, glipizide, Invokana (UTIs, nocturia)] , I suggested WelChol at last visit (given coupon). She did not start >> expensive. She takes a higher Glipizide dose: 01-24-09 mg with each meal. - unfortunately, she is not checking sugars >> advised to start - However, today, HbA1c is 6.7% (lower) >> will old off escalating her regimen for now, but advised her that she may need a lower Glipizide dose with lunch - I advised her to:  Patient Instructions  Please continue: - Cycloset 2.4 mg daily in am - Glipizide 5 mg in am,  5-10 mg before lunch, and 10 mg before dinner  Continue: - Livalo 2 mg - take it daily  Please return in 3-4 months with your sugar log.   - start checking sugars at  different times of the day - check 1x a day, rotating checks - advised for yearly eye exams >> she is UTD - UTD with flu shot - Return to clinic in 3-7mo with sugar log   2. HL - Lipids improved after starting Livalo, despite not being compliant with the doses. Her LDL decreased from 200 to 161. We discussed about the need to take it every day  (was taking it every 2 weeks).   Carlus Pavlov, MD PhD [facility]

## 2017-07-25 ENCOUNTER — Other Ambulatory Visit: Payer: Self-pay | Admitting: *Deleted

## 2017-07-25 MED ORDER — ATENOLOL 50 MG PO TABS: ORAL_TABLET | ORAL | 0 refills | Status: DC

## 2017-08-06 ENCOUNTER — Other Ambulatory Visit: Payer: Self-pay | Admitting: Internal Medicine

## 2017-08-06 ENCOUNTER — Other Ambulatory Visit (HOSPITAL_COMMUNITY): Payer: Self-pay | Admitting: Psychiatry

## 2017-08-15 ENCOUNTER — Telehealth (HOSPITAL_COMMUNITY): Payer: Self-pay

## 2017-08-15 NOTE — Telephone Encounter (Signed)
Dot LanesKrista called patient and left a VM informing her that she needs to make an appointment to receive a letter or refills.

## 2017-08-15 NOTE — Telephone Encounter (Signed)
Patient called stating that she needs a refill on Sertraline and wants a letter from Dr. Gilmore LarocheAkhtar stating she is disabled so she can get into a fitness center. Please review and advise.

## 2017-08-18 ENCOUNTER — Other Ambulatory Visit: Payer: Self-pay | Admitting: Nurse Practitioner

## 2017-08-20 ENCOUNTER — Telehealth (HOSPITAL_COMMUNITY): Payer: Self-pay | Admitting: Psychiatry

## 2017-08-20 NOTE — Telephone Encounter (Signed)
Patient made an appt for tomorrow morning. Pt would still like to get a week supply of medication. She does have 1 pill left but is uncomfortable about not having any meds left at all.   Can we send 1 week supply?   CB # [phone]

## 2017-08-20 NOTE — Telephone Encounter (Signed)
Called patient back and left a VM informing her the per Dr. Gilmore LarocheAkhtar she should be ok to wait until tomorrow for any refills. Nothing further is needed at this time.

## 2017-08-20 NOTE — Telephone Encounter (Signed)
If have appoitment for tomorrow. Should be ok to give refill then

## 2017-08-21 ENCOUNTER — Encounter (HOSPITAL_COMMUNITY): Payer: Self-pay | Admitting: Psychiatry

## 2017-08-21 ENCOUNTER — Ambulatory Visit (INDEPENDENT_AMBULATORY_CARE_PROVIDER_SITE_OTHER): Payer: PPO | Admitting: Psychiatry

## 2017-08-21 DIAGNOSIS — F411 Generalized anxiety disorder: Secondary | ICD-10-CM

## 2017-08-21 DIAGNOSIS — F3341 Major depressive disorder, recurrent, in partial remission: Secondary | ICD-10-CM

## 2017-08-21 DIAGNOSIS — Z62810 Personal history of physical and sexual abuse in childhood: Secondary | ICD-10-CM

## 2017-08-21 DIAGNOSIS — F1099 Alcohol use, unspecified with unspecified alcohol-induced disorder: Secondary | ICD-10-CM

## 2017-08-21 DIAGNOSIS — Z818 Family history of other mental and behavioral disorders: Secondary | ICD-10-CM | POA: Diagnosis not present

## 2017-08-21 DIAGNOSIS — Z736 Limitation of activities due to disability: Secondary | ICD-10-CM | POA: Diagnosis not present

## 2017-08-21 DIAGNOSIS — Z810 Family history of intellectual disabilities: Secondary | ICD-10-CM

## 2017-08-21 DIAGNOSIS — F4310 Post-traumatic stress disorder, unspecified: Secondary | ICD-10-CM

## 2017-08-21 MED ORDER — SERTRALINE HCL 100 MG PO TABS: ORAL_TABLET | ORAL | 1 refills | Status: DC
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
## 2017-08-22 ENCOUNTER — Encounter: Payer: Self-pay | Admitting: Internal Medicine

## 2017-08-22 ENCOUNTER — Ambulatory Visit (INDEPENDENT_AMBULATORY_CARE_PROVIDER_SITE_OTHER): Payer: PPO | Admitting: Internal Medicine

## 2017-08-22 ENCOUNTER — Ambulatory Visit: Payer: Self-pay | Admitting: Internal Medicine

## 2017-08-22 VITALS — BP 130/88 | HR 62 | Ht 62.0 in | Wt 170.6 lb

## 2017-08-22 DIAGNOSIS — E785 Hyperlipidemia, unspecified: Secondary | ICD-10-CM | POA: Diagnosis not present

## 2017-08-22 DIAGNOSIS — E1165 Type 2 diabetes mellitus with hyperglycemia: Secondary | ICD-10-CM | POA: Diagnosis not present

## 2017-08-22 MED ORDER — METFORMIN HCL ER 500 MG PO TB24: 500.0000 mg | ORAL_TABLET | Freq: Every day | ORAL | 5 refills | Status: DC

## 2017-08-22 MED ORDER — FLUVASTATIN SODIUM ER 80 MG PO TB24: 80.0000 mg | ORAL_TABLET | Freq: Every day | ORAL | 11 refills | Status: DC

## 2017-08-22 NOTE — Progress Notes (Signed)
Patient ID: Abigail CoveSara E Zellman, female   DOB: 31-Jan-1966, 51 y.o.   MRN: [id number]  HPI: [name] is a 51 y.o.-year-old female, returning for f/u for DM2, dx 2013, non-insulin-dependent, recently uncontrolled, without long-term complications. Last visit 1.5 months ago.  Her dog died 07/2017 >> stressed >> insomnia >> sugars higher.  Also, eating more icecream.  She has IBS >> better on L- plantarum, S.bulardii,  Lysosyme.  Last hemoglobin A1c was: Lab Results  Component Value Date   HGBA1C 6.6 07/12/2017   HGBA1C 7.7 04/11/2017   HGBA1C 7.1 09/27/2016   Pt is on a regimen of: - Cycloset 2.4 mg daily in am - Glipizide 10 mg before lunch, and 10 mg before dinner She did not start Welchol 2 tabs 2x a day - added 03/2017 - too expensive On Turmeric, alpha-lipoic acid, Garlicin.  She had various intolerances to different diabetes medications:  Metformin ER >> gastric discomfort and severe diarrhea (loss of bowel control).   Januvia >> tried for 1-2 months >> left chest pain  She was wondering if she could take Invokana, however she had multiple vaginal yeast infections. She Also had nocturia 3-4 x a night.   She had reactive hypoglycemia symptoms while on Quercetin (was taking it for allergies) >> we stopped this it is known to increase insulin production by the pancreas.  She now checks sugars once a day: - am: 108-171, 295 (had 3 bags of sweets)  >> sleeping late - 1h after b'fast: 207 (after banana bread) >> n/c - before lunch: 145-193 (at waking up) >> 169-170s, 200s (icecream the night before) - 2h after lunch: 187 >> n/c >> 172 >> n/c - bedtime: 153 (after snack) >> 142-223 >> 153, 239 >> n/c - nighttime: 112-157 >> n/c >> 151-270, 338 >> 104, 164-214 >> 160-200s  Has many food intolerances: - Gluten >> cannot eat bread or pastry.  - raw vegetables, beans, cruciferous vegetables, fruit >> diarrhea.  - No dairy, however, she does eat ice cream - She does not digest fat  well after her cholecystectomy in 2011.   -No CKD, last BUN/creatinine was:  Lab Results  Component Value Date   BUN 22 02/22/2017   CREATININE 0.86 02/22/2017  On irbesartan  -She has HL. She was on Questran for diarrhea >> stopped.  We started Livalo and she continues Benecol spread. Not taking Livalo b/c mm aches.  Lab Results  Component Value Date   CHOL 282 (H) 02/22/2017   HDL 101.30 02/22/2017   LDLCALC 153 (H) 02/22/2017   LDLDIRECT 205.0 01/24/2016   TRIG 137.0 02/22/2017   CHOLHDL 3 02/22/2017  Previous cholesterol levels were: 325/197/132/154.   - last dilated eye exam was in 03/2017: No DR (Dr Randon GoldsmithLyles)   - she has numbness and tingling in her legs. She is on R enantiomer of alpha lipoic acid.  She also has a history of PCOS - [facility] - note from 08/26/2012: perimenopause, and at that time she was on Yasmin, subsequently changed to Necon. She sees Dr. Marvel PlanMichelle Grewall with OB/GYN. She has PTSD, MDD, insomnia, anxiety, asthma, HTN, GERD, status post cholecystectomy, anemia, menorrhagia, improved a little on new OCP, transaminitis. She also had increased uterine bleeding (on Necon). She continues seeing an Ship brokeracupuncturist. She takes Nettle powder >> helped her with diarrhea, anemia, insomnia. 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  Last TSH was  normal: Lab Results  Component Value Date   TSH 2.91 01/24/2016   She will go on a cruise in January.  ROS: Constitutional: no weight gain/no weight loss, + fatigue, no subjective hyperthermia, no subjective hypothermia, + nocturia Eyes: no blurry vision, no xerophthalmia ENT: no sore throat, no nodules palpated in throat, no dysphagia, no odynophagia, no hoarseness Cardiovascular: no CP/+ SOB/no palpitations/no leg swelling Respiratory: no cough/+ SOB/+ wheezing Gastrointestinal: no N/no V/no D/no C/no acid reflux Musculoskeletal: no muscle  aches/no joint aches Skin: no rashes, no hair loss Neurological: no tremors/+ numbness/+ tingling/no dizziness  I reviewed pt's medications, allergies, PMH, social hx, family hx, and changes were documented in the history of present illness. Otherwise, unchanged from my initial visit note.  PE: BP 130/88   Pulse 62   Ht 5\' 2"  (1.575 m)   Wt 170 lb 9.6 oz (77.4 kg)   SpO2 95%   BMI 31.20 kg/m  Body mass index is 31.2 kg/m.  Wt Readings from Last 3 Encounters:  08/22/17 170 lb 9.6 oz (77.4 kg)  07/12/17 166 lb (75.3 kg)  07/09/17 168 lb 12.8 oz (76.6 kg)   Constitutional: overweight, in NAD Eyes: PERRLA, EOMI, no exophthalmos ENT: moist mucous membranes, no thyromegaly, no cervical lymphadenopathy Cardiovascular: RRR, No MRG Respiratory: CTA B Gastrointestinal: abdomen soft, NT, ND, BS+ Musculoskeletal: no deformities, strength intact in all 4 Skin: moist, warm, no rashes Neurological: no tremor with outstretched hands, DTR normal in all 4  ASSESSMENT: 1. DM2, non-insulin-dependent, fairly well controlled, without long term complications, but with hyperglycemia  2. HL - Very high LDL - high HDL - slightly high TG  PLAN:  1. DM2 - Patient with fairly well-controlled diabetes, with a hindered by binge eating when stressed.  She did see nutrition in the past.  Also, she has multiple medication intolerances to metformin (even extended release, Januvia, glipizide, Invokana (UTIs, nocturia) and she could not get Welchol.  Therefore we discussed at length at last visit about the need to improve her diet.  Also, she was not checking sugars at last visit and I strongly advised her to start. - She called and scheduled this appointment earlier because of her erratic blood sugars.  After the death of her dog, she was more stressed and eating more ice cream >> sugars are higher.  She is wondering whether she can retry metformin since her IBS condition is now much better on her current  probiotics.  We can definitely try this: I advised her to start extended release Metformin once a day initially and then increase to twice a day. - I advised her to:  Patient Instructions  Please continue: - Cycloset 2.4 mg daily in am - Glipizide 10 mg before lunch, and 10 mg before dinner  Please add: - Metformin ER 500 mg with dinner. If you tolerate this well, increase to 500 mg 2x a day with meals.  Change Livalo to Fluvastatin XL 80.  Please return in 3 months with your sugar log.   - We reviewed her latest HbA1c and this was 6.6% (lower) - continue checking sugars at different times of the day - check 1-2x a day, rotating checks - advised for yearly eye exams >> she is UTD - Up-to-date with flu shot - Return to clinic in 3 mo with sugar log   2. HL - Reviewed latest lipid panel from 02/2017.  Her LDL improved after starting Livalo, despite not being completely compliant with the doses: From 200 >>  150.  At last visit, we discussed about the need to take this daily >> however, she did not take it since last visit due to muscle aches every time she takes the medication  - We discussed about trying Fluvastatin XL which is known to cause less muscle aches.  She agrees to try.  Prescription sent to her pharmacy.  Carlus Pavlov, MD PhD [facility]

## 2017-08-22 NOTE — Progress Notes (Deleted)
Patient ID: [name], female   DOB: [birth year], 51 y.o.   MRN: [id number]  HPI: Abigail CoveSara E Russell is a 51 y.o.-year-old female, returning for f/u for DM2, dx 2013, non-insulin-dependent, recently uncontrolled, without long-term complications. Last visit 1.5 months ago.  Last hemoglobin A1c was: Lab Results  Component Value Date   HGBA1C 6.6 07/12/2017   HGBA1C 7.7 04/11/2017   HGBA1C 7.1 09/27/2016   Pt is on a regimen of: - Cycloset 2.4 mg daily in am - Glipizide 5 mg in am,  5-10 mg before lunch, and 10 mg before dinner She did not start Welchol 2 tabs 2x a day - added 03/2017 - too expensive On Turmeric, alpha-lipoic acid, Garlicin.  She had various intolerances to different diabetes medications:  Metformin ER >> gastric discomfort and severe diarrhea (loss of bowel control).   Januvia >> tried for 1-2 months >> left chest pain  She was wondering if she could take Invokana, however she had multiple vaginal yeast infections. She Also had nocturia 3-4 x a night.   She had reactive hypoglycemia symptoms while on Quercetin (was taking it for allergies) >> we stopped this it is known to increase insulin production by the pancreas.  She now checks sugars once a day: - am: 105, 129, 165-232, 300 >> 108-171, 295 (had 3 bags of sweets)  - 1h after b'fast: 207 (after banana bread) >> n/c - before lunch: 1146-169, 255 >> n/c >> 148 >> 145-193 (at waking up) - 2h after lunch: 187 >> n/c >> 172 - bedtime: 153 (after snack) >> 142-223 >> 153, 239 >> n/c - nighttime: 112-157 >> n/c >> 151-270, 338 >> 104, 164-214  Has many food intolerances: - wheat >> cannot eat bread or pastry.  - raw vegetables, beans, cruciferous vegetables, fruit >> diarrhea.  - No dairy - She does not digest fat well after her cholecystectomy in 2011.   -No CKD, last BUN/creatinine was:  Lab Results  Component Value Date   BUN 22 02/22/2017   CREATININE 0.86 02/22/2017  On irbesartan  -She has HL. She was  on Questran for diarrhea >> stopped.  We started Livalo and she continues Benecol spread. Lab Results  Component Value Date   CHOL 282 (H) 02/22/2017   HDL 101.30 02/22/2017   LDLCALC 153 (H) 02/22/2017   LDLDIRECT 205.0 01/24/2016   TRIG 137.0 02/22/2017   CHOLHDL 3 02/22/2017  Previous cholesterol levels were: 325/197/132/154.   - last dilated eye exam was in 03/2017: No DR (Dr Randon GoldsmithLyles)   - she has numbness and tingling in her legs. She is on R enantiomer of alpha lipoic acid.  She also has a history of PCOS - [facility] - note from 08/26/2012: perimenopause, and at that time she was on Yasmin, subsequently changed to Necon. She sees Dr. Marvel PlanMichelle Grewall with OB/GYN. She has PTSD, MDD, insomnia, anxiety, asthma, HTN, GERD, status post cholecystectomy, anemia, menorrhagia, improved a little on new OCP, transaminitis. She also had increased uterine bleeding (on Necon). She Is seeing an acupuncturist. She takes Nettle powder >> helped her with diarrhea, anemia, insomnia. 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  Last TSH was normal: Lab Results  Component Value Date   TSH 2.91 01/24/2016   ROS: Constitutional: no weight gain/no weight loss, no fatigue, no subjective hyperthermia, no subjective hypothermia, + nocturia Eyes: no blurry vision, no xerophthalmia ENT: no  sore throat, no nodules palpated in throat, no dysphagia, no odynophagia, no hoarseness Cardiovascular: no CP/no SOB/no palpitations/no leg swelling Respiratory: no cough/no SOB/no wheezing Gastrointestinal: no N/no V/no D/no C/no acid reflux Musculoskeletal: no muscle aches/no joint aches Skin: no rashes, no hair loss Neurological: no tremors/+ numbness/+ tingling/no dizziness  I reviewed pt's medications, allergies, PMH, social hx, family hx, and changes were documented in the history of present illness. Otherwise, unchanged from my initial  visit note.  PE: There were no vitals taken for this visit. There is no height or weight on file to calculate BMI.  Wt Readings from Last 3 Encounters:  07/12/17 166 lb (75.3 kg)  07/09/17 168 lb 12.8 oz (76.6 kg)  04/11/17 164 lb (74.4 kg)   Constitutional: overweight, in NAD Eyes: PERRLA, EOMI, no exophthalmos ENT: moist mucous membranes, no thyromegaly, no cervical lymphadenopathy Cardiovascular: RRR, No MRG Respiratory: CTA B Gastrointestinal: abdomen soft, NT, ND, BS+ Musculoskeletal: no deformities, strength intact in all 4 Skin: moist, warm, no rashes Neurological: no tremor with outstretched hands, DTR normal in all 4  ASSESSMENT: 1. DM2, non-insulin-dependent, fairly well controlled, without long term complications, but with hyperglycemia  2. HL - Very high LDL - high HDL - slightly high TG  PLAN:  1. DM2 - Patient with fairly well-controlled diabetes, with a hindered by binge eating when stressed.  She did see nutrition in the past.  Also, she has multiple medication intolerances to metformin (even extended release, Januvia, glipizide, Invokana (UTIs, nocturia) and she could not get Welchol.  Therefore we discussed at length at last visit about the need to improve her diet.  Also, she was not checking sugars at last visit and I strongly advised her to start. - She called and scheduled this appointment earlier because of her erratic blood sugars - I advised her to:  Patient Instructions  Please continue: - Cycloset 2.4 mg daily in am - Glipizide 5 mg in am,  5-10 mg before lunch, and 10 mg before dinner  Continue: - Livalo 2 mg daily  Please return in 3 months with your sugar log.   - We reviewed her latest HbA1c and this was 6.6% (lower) - continue checking sugars at different times of the day - check 1-2x a day, rotating checks - advised for yearly eye exams >> she is UTD - Return to clinic in 3 mo with sugar log   2. HL - Reviewed latest lipid panel from  02/2017.  Her LDL improved after starting Livalo, despite not being completely compliant with the doses: From 200-150.  At last visit, we discussed about the need to take this daily  (She was taking it every 2 weeks).  Carlus Pavlovristina Leomia Blake, MD PhD [facility]

## 2017-08-22 NOTE — Patient Instructions (Addendum)
Please continue: - Cycloset 2.4 mg daily in am - Glipizide 10 mg before lunch, and 10 mg before dinner  Please add: - Metformin ER 500 mg with dinner. If you tolerate this well, increase to 500 mg 2x a day with meals.  Change Livalo to Fluvastatin XL 80.  Please return in 3 months with your sugar log.

## 2017-08-28 ENCOUNTER — Other Ambulatory Visit: Payer: Self-pay

## 2017-08-28 MED ORDER — ATENOLOL 50 MG PO TABS: ORAL_TABLET | ORAL | 1 refills | Status: DC

## 2017-08-29 ENCOUNTER — Other Ambulatory Visit: Payer: Self-pay

## 2017-08-29 ENCOUNTER — Telehealth: Payer: Self-pay | Admitting: Nurse Practitioner

## 2017-08-29 MED ORDER — ATENOLOL 50 MG PO TABS: ORAL_TABLET | ORAL | 1 refills | Status: DC

## 2017-08-29 NOTE — Telephone Encounter (Signed)
Copied from CRM #[id number]. Topic: Quick Communication - See Telephone Encounter >> Aug 29, 2017  9:10 AM Eston Mouldavis, Sequoyah Counterman B wrote: CRM for notification. See Telephone encounter for:  Refill atenlol  3 month supply  PT says this refill has been sent in by two [facility]  but [facility] on [address] 08/29/17.

## 2017-09-02 ENCOUNTER — Other Ambulatory Visit: Payer: Self-pay

## 2017-09-02 MED ORDER — ATENOLOL 50 MG PO TABS: ORAL_TABLET | ORAL | 1 refills | Status: DC

## 2017-09-19 ENCOUNTER — Other Ambulatory Visit: Payer: Self-pay | Admitting: *Deleted

## 2017-09-19 DIAGNOSIS — I10 Essential (primary) hypertension: Secondary | ICD-10-CM

## 2017-09-19 MED ORDER — IRBESARTAN 75 MG PO TABS: ORAL_TABLET | ORAL | 0 refills | Status: DC

## 2017-09-23 ENCOUNTER — Other Ambulatory Visit: Payer: Self-pay

## 2017-09-23 MED ORDER — GLIPIZIDE 5 MG PO TABS: ORAL_TABLET | ORAL | 0 refills | Status: DC

## 2017-10-23 ENCOUNTER — Other Ambulatory Visit: Payer: Self-pay

## 2017-10-23 DIAGNOSIS — I10 Essential (primary) hypertension: Secondary | ICD-10-CM

## 2017-10-23 MED ORDER — IRBESARTAN 75 MG PO TABS: ORAL_TABLET | ORAL | 0 refills | Status: DC

## 2017-10-25 ENCOUNTER — Ambulatory Visit: Payer: Self-pay | Admitting: Nurse Practitioner

## 2017-11-12 ENCOUNTER — Other Ambulatory Visit: Payer: Self-pay | Admitting: Internal Medicine

## 2017-11-18 ENCOUNTER — Other Ambulatory Visit: Payer: Self-pay | Admitting: Nurse Practitioner

## 2017-11-25 ENCOUNTER — Other Ambulatory Visit: Payer: Self-pay

## 2017-11-25 DIAGNOSIS — I10 Essential (primary) hypertension: Secondary | ICD-10-CM

## 2017-11-25 MED ORDER — IRBESARTAN 75 MG PO TABS: ORAL_TABLET | ORAL | 0 refills | Status: DC

## 2017-11-27 ENCOUNTER — Encounter: Payer: Self-pay | Admitting: Internal Medicine

## 2017-11-27 ENCOUNTER — Ambulatory Visit (INDEPENDENT_AMBULATORY_CARE_PROVIDER_SITE_OTHER): Payer: PPO | Admitting: Internal Medicine

## 2017-11-27 VITALS — BP 116/70 | HR 62 | Ht 62.0 in | Wt 165.0 lb

## 2017-11-27 DIAGNOSIS — E1165 Type 2 diabetes mellitus with hyperglycemia: Secondary | ICD-10-CM

## 2017-11-27 DIAGNOSIS — E785 Hyperlipidemia, unspecified: Secondary | ICD-10-CM | POA: Diagnosis not present

## 2017-11-27 LAB — POCT GLYCOSYLATED HEMOGLOBIN (HGB A1C): HEMOGLOBIN A1C: 7.5

## 2017-11-27 NOTE — Patient Instructions (Addendum)
Please take consistently: - Metformin ER 500 mg 2x a day with meals  Continue: - Cycloset 2.4 mg daily in am - Glipizide 10 mg before lunch, and 10 mg before dinner  Please return in 3 months with your sugar log.

## 2017-11-27 NOTE — Progress Notes (Signed)
Patient ID: [name], female   DOB: [birth year], 52 y.o.   MRN: [id number]  HPI: Abigail CoveSara E Mcgirr is a 52 y.o.-year-old female, returning for f/u for DM2, dx 2013, non-insulin-dependent, recently uncontrolled, without long-term complications. Last visit 3 months ago.   Since last visit, she started to have sinus congestion and headaches due to allergies.  Last hemoglobin A1c was: Lab Results  Component Value Date   HGBA1C 6.6 07/12/2017   HGBA1C 7.7 04/11/2017   HGBA1C 7.1 09/27/2016   Pt is on a regimen of: - Metformin ER 500 mg 2x a day with meals  - started 08/2017- but taking it sparsely - Cycloset 2.4 mg daily in am - Glipizide 10 mg before lunch, and 10 mg before dinner She did not start Welchol 2 tabs 2x a day - added 03/2017 - too expensive On Turmeric, alpha-lipoic acid, Garlicin.  She had various intolerances to different diabetes medications:  Metformin ER >> gastric discomfort and severe diarrhea (loss of bowel control).   Januvia >> tried for 1-2 months >> left chest pain  She was wondering if she could take Invokana, however she had multiple vaginal yeast infections. She Also had nocturia 3-4 x a night.   She had reactive hypoglycemia symptoms while on Quercetin (was taking it for allergies) >> we stopped this it is known to increase insulin production by the pancreas.  She is not checking sugars. - am: 108-171, 295 (had 3 bags of sweets)  >> sleeping late - 1h after b'fast: 207 (after banana bread) >> n/c - before lunch:169-170s, 200s (icecream) - 2h after lunch: 187 >> n/c >> 172 >> n/c - bedtime: 142-223 >> 153, 239 >> n/c - nighttime: 104, 164-214 >> 160-200s >> n/c  She has many food intolerances: - Gluten >> cannot eat bread or pastry.  - raw vegetables, beans, cruciferous vegetables, fruit >> diarrhea.  - No dairy, however, she does eat ice cream - She does not digest fat well after her cholecystectomy in 2011.   - No CKD, last BUN/creatinine was:   Lab Results  Component Value Date   BUN 22 02/22/2017   CREATININE 0.86 02/22/2017  On irbesartan.  - +HL: Lab Results  Component Value Date   CHOL 282 (H) 02/22/2017   HDL 101.30 02/22/2017   LDLCALC 153 (H) 02/22/2017   LDLDIRECT 205.0 01/24/2016   TRIG 137.0 02/22/2017   CHOLHDL 3 02/22/2017  Previous cholesterol levels were: 325/197/132/154.  She was previously on Questran for diarrhea, but this was stopped as her diarrhea improved.  We then started Livalo and she was also using Theatre managerBennett).  However, at last visit, she was not taking Livalo due to muscle pains.  I suggested fluvastatin XL.  - last dilated eye exam was in 03/2017: No DR (Dr Randon GoldsmithLyles)    - + numbness and tingling in her legs.  She is on R enantiomer of alpha lipoic acid.  She also has a history of PCOS - she [facility] - note from 08/26/2012: perimenopause, and at that time she was on Yasmin, subsequently changed to Necon. She sees Dr. Marvel PlanMichelle Grewall with OB/GYN. She has PTSD, MDD, insomnia, anxiety, asthma, HTN, GERD, status post cholecystectomy, anemia, menorrhagia, improved a little on new OCP, transaminitis. She also had increased uterine bleeding (on Necon). She continues seeing an Ship brokeracupuncturist. She takes Nettle powder >> helped her with diarrhea, anemia, insomnia. 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 Also, Undecylenic acid and mastic gum, no carbs x 1-2 months.  She has IBS >> better on L- plantarum, S.bulardii - Brevibacillus Laterosporus 20 min before b'fast.   Last TSH was normal: Lab Results  Component Value Date   TSH 2.91 01/24/2016   ROS: Constitutional: no weight gain/no weight loss, no fatigue, no subjective hyperthermia, no subjective hypothermia Eyes: no blurry vision, no xerophthalmia ENT: no sore throat, no nodules palpated in throat, no dysphagia, no odynophagia, no hoarseness Cardiovascular: no CP/no SOB/no  palpitations/no leg swelling Respiratory: no cough/no SOB/no wheezing Gastrointestinal: no N/no V/no D/no C/no acid reflux Musculoskeletal: no muscle aches/no joint aches Skin: no rashes, no hair loss Neurological: no tremors/+ numbness/+ tingling/no dizziness  I reviewed pt's medications, allergies, PMH, social hx, family hx, and changes were documented in the history of present illness. Otherwise, unchanged from my initial visit note. Started Diosmin for venous blood flow.  PE: BP 116/70 (BP Location: Left Arm, Patient Position: Sitting, Cuff Size: Normal)   Pulse 62   Ht 5\' 2"  (1.575 m)   Wt 165 lb (74.8 kg)   SpO2 97%   BMI 30.18 kg/m  Body mass index is 30.18 kg/m.  Wt Readings from Last 3 Encounters:  11/27/17 165 lb (74.8 kg)  08/22/17 170 lb 9.6 oz (77.4 kg)  07/12/17 166 lb (75.3 kg)   Constitutional: overweight, in NAD Eyes: PERRLA, EOMI, no exophthalmos ENT: moist mucous membranes, no thyromegaly, no cervical lymphadenopathy Cardiovascular: RRR, No MRG Respiratory: CTA B Gastrointestinal: abdomen soft, NT, ND, BS+ Musculoskeletal: no deformities, strength intact in all 4 Skin: moist, warm, no rashes Neurological: no tremor with outstretched hands, DTR normal in all 4  ASSESSMENT: 1. DM2, non-insulin-dependent, fairly well controlled, without long term complications, but with hyperglycemia  2. HL - Very high LDL - high HDL - slightly high TG  PLAN:  1. DM2 - Patient with fairly well-controlled diabetes, but hindered by binge eating when stressed.  She did see nutrition in the past.  Also, she has multiple medication intolerance, which limits our capacity to treat her diabetes.  At last visit, she agreed to retry metformin extended release and we also discussed about improving her diet.  She was not checking sugars and I advised her to start doing so.  At last visit, whenever she checked, sugars were higher as she was eating more ice cream after the death of her  dog. - Since last visit, she did not check sugars and did not take metformin consistently.  She only took it when she was eating a large meal. - today, HbA1c is 7.5% (higher) - at This visit, I advised her to start checking her sugars again and also to start taking metformin consistently - I advised her to:  Patient Instructions  Please take consistently: - Metformin ER 500 mg 2x a day with meals  Continue: - Cycloset 2.4 mg daily in am - Glipizide 10 mg before lunch, and 10 mg before dinner  Please return in 3 months with your sugar log.   - continue checking sugars at different times of the day - check 1x a day, rotating checks - advised for yearly eye exams >> she is UTD - Return to clinic in 4 mo with sugar log   2. HL - Reviewed latest lipid panel from 02/2017: LDL improved from 200 to150 after starting Livalo, despite not being completely compliant with the doses. - However, she had muscle aches with Livalo, so at  last visit I suggested to start fluvastatin XL which is known to cause less muscle aches. She did not start. She plans to do so.  Carlus Pavlov, MD PhD [facility]

## 2017-12-06 ENCOUNTER — Other Ambulatory Visit: Payer: Self-pay | Admitting: Nurse Practitioner

## 2017-12-06 DIAGNOSIS — M62838 Other muscle spasm: Secondary | ICD-10-CM

## 2017-12-06 DIAGNOSIS — Z8669 Personal history of other diseases of the nervous system and sense organs: Secondary | ICD-10-CM

## 2017-12-06 NOTE — Telephone Encounter (Signed)
Copied from CRM [phone]. Topic: Quick Communication - Rx Refill/Question >> Dec 06, 2017  3:31 PM Maia PettiesOrtiz, Kristie S wrote: Medication: methocarbamol (ROBAXIN) 500 MG tablet prn and naproxen (NAPROSYN) 500 MG tablet 2 tabs as needed Has the patient contacted their pharmacy? Yes - expired Preferred Pharmacy (with phone number or street name): [facility] [phone] - , [place] - [address] [facility] [address] [phone] (Phone) [phone] (Fax)

## 2017-12-06 NOTE — Telephone Encounter (Signed)
Historical medication from provider no longer in practice: Methocarbamol 500 mg Naproxen 500 mg  LOV: 02/05/17   PCP: Aura CampsShambley   Pharmacy: verified

## 2017-12-09 NOTE — Telephone Encounter (Signed)
Pt has made new appt w/Ashleigh but its not until 01/07/18 pls advise.Marland Kitchen.Raechel Chute/lmb

## 2017-12-10 MED ORDER — METHOCARBAMOL 500 MG PO TABS: 500.0000 mg | ORAL_TABLET | Freq: Three times a day (TID) | ORAL | 0 refills | Status: DC | PRN

## 2017-12-10 MED ORDER — NAPROXEN 500 MG PO TABS: 500.0000 mg | ORAL_TABLET | Freq: Two times a day (BID) | ORAL | 1 refills | Status: DC

## 2017-12-10 NOTE — Telephone Encounter (Signed)
Patient wanted to notify NP Shambleigh that she needs these to scripts to manage tension headaches and migraines.

## 2017-12-10 NOTE — Telephone Encounter (Signed)
Per chart Morrie Sheldonshley has already approved refills has [facility] to [facility].Marland Kitchen.Raechel Chute/lmb

## 2017-12-16 ENCOUNTER — Other Ambulatory Visit: Payer: Self-pay | Admitting: Nurse Practitioner

## 2017-12-22 ENCOUNTER — Other Ambulatory Visit: Payer: Self-pay | Admitting: Internal Medicine

## 2017-12-25 ENCOUNTER — Telehealth: Payer: Self-pay | Admitting: *Deleted

## 2017-12-25 DIAGNOSIS — I10 Essential (primary) hypertension: Secondary | ICD-10-CM

## 2017-12-25 MED ORDER — IRBESARTAN 75 MG PO TABS: ORAL_TABLET | ORAL | 0 refills | Status: DC

## 2017-12-25 NOTE — Telephone Encounter (Signed)
RECEIVED FAX FOR REFILL ON PT IRBESARTAN. SENT ELECTRONICALLY.Marland Kitchen./LMB

## 2018-01-07 ENCOUNTER — Encounter: Payer: Self-pay | Admitting: Nurse Practitioner

## 2018-01-07 ENCOUNTER — Ambulatory Visit (INDEPENDENT_AMBULATORY_CARE_PROVIDER_SITE_OTHER): Payer: PPO | Admitting: Nurse Practitioner

## 2018-01-07 VITALS — BP 114/64 | HR 56 | Temp 98.4°F | Resp 16 | Ht 62.0 in | Wt 167.8 lb

## 2018-01-07 DIAGNOSIS — I10 Essential (primary) hypertension: Secondary | ICD-10-CM

## 2018-01-07 DIAGNOSIS — G43009 Migraine without aura, not intractable, without status migrainosus: Secondary | ICD-10-CM | POA: Diagnosis not present

## 2018-01-07 DIAGNOSIS — E785 Hyperlipidemia, unspecified: Secondary | ICD-10-CM

## 2018-01-07 NOTE — Assessment & Plan Note (Signed)
Stable, continue current medications F/U for new or worsening symptoms

## 2018-01-07 NOTE — Patient Instructions (Addendum)
Return in about 1 month, after 5/22, for your annual physical   It was good to meet you. Thanks for letting me take care of you today :)

## 2018-01-07 NOTE — Assessment & Plan Note (Signed)
Last lipid panel 02/2017 showed improved cholesterol readings but per chart review she may have been on statin at that time She declines statin therapy again today RTC in about 1 month for CPE with fasting lipid panel

## 2018-01-07 NOTE — Assessment & Plan Note (Signed)
Stable, continue current medications Labs are up to date

## 2018-01-07 NOTE — Progress Notes (Signed)
Name: [name]   MRN: [id number]    DOB: June 21, 1966   Date:01/07/2018       Progress Note  Subjective  Chief Complaint  Chief Complaint  Patient presents with  . Establish Care    HPI Ms Gosney also follows with GYN for management of womens health care, PCOS, Endocrinology for type 2 diabetes mellitus and hyperlipidemia,and  Behavioral health provider for major depressive disorder, GAD, PTSD. We will follow up on HTN, HLD, and migraines during todays visit as she has no complaints  Hypertension -maintained on atenolol 100 BID, irbesartan 75 daily Reports daily medication compliance without adverse medication effects. Reports she does not routinely check her BP at home because she sees endocrinology every 3 months and her BP readings are always normal Denies vision changes, chest pain, shortness of breath, edema.=  BP Readings from Last 3 Encounters:  01/07/18 114/64  11/27/17 116/70  08/22/17 130/88   Cholesterol- maintained on statins in the past but stopped due to cost and myalgias She says that she has discussed her hyperlipidemia extensively with her endocrinologist and prefers not to go back on statins at this time  Lab Results  Component Value Date   CHOL 282 (H) 02/22/2017   HDL 101.30 02/22/2017   LDLCALC 153 (H) 02/22/2017   LDLDIRECT 205.0 01/24/2016   TRIG 137.0 02/22/2017   CHOLHDL 3 02/22/2017   Migraines- She complains of chronic migraines for years The migraines have not changed or increased in frequency over time She experiences about 2 migraines per month, which are relieved by naproxen She sometimes takes robaxin as well if she is having neck pain, tension with the migraines She denies weakness, syncope, visual disturbance, nausea, vomiting.  Patient Active Problem List   Diagnosis Date Noted  . ASCUS with positive high risk HPV cervical 03/13/2017  . Type 2 diabetes mellitus with hyperglycemia, without long-term current use of insulin (HCC)  03/28/2016  . Oligomenorrhea 02/22/2016  . Medicare annual wellness visit, subsequent 01/24/2016  . Anemia 04/14/2014  . Hyperlipidemia 08/18/2013  . Essential hypertension 08/26/2012  . Irregular menses 08/07/2012  . History of abnormal Pap smear 08/07/2012  . GERD (gastroesophageal reflux disease) 08/07/2012  . Asthma, mild persistent 08/07/2012  . Migraine 08/07/2012  . History of PCOS 08/07/2012  . Genital warts 08/07/2012  . S/P cholecystectomy 08/07/2012  . Seasonal and perennial allergic rhinitis 08/07/2012  . Severe episode of recurrent major depressive disorder (HCC) 12/06/2011  . PTSD (post-traumatic stress disorder) 11/02/2011    Past Surgical History:  Procedure Laterality Date  . CHOLECYSTECTOMY    . GALLBLADDER SURGERY      Family History  Problem Relation Age of Onset  . Bipolar disorder Mother   . Hypertension Mother   . Diabetes Mother   . Alzheimer's disease Father   . Hypertension Father   . Alzheimer's disease Maternal Grandmother   . Heart disease Paternal Grandfather     Social History   Socioeconomic History  . Marital status: Single    Spouse name: Not on file  . Number of children: 0  . Years of education: 21  . Highest education level: Not on file  Occupational History  . Occupation: Long Term Disability    Comment: PTSD  Social Needs  . Financial resource strain: Not on file  . Food insecurity:    Worry: Not on file    Inability: Not on file  . Transportation needs:    Medical: Not on file  Non-medical: Not on file  Tobacco Use  . Smoking status: Never Smoker  . Smokeless tobacco: Never Used  Substance and Sexual Activity  . Alcohol use: Yes    Alcohol/week: 0.0 oz    Types: 1 Standard drinks or equivalent per week    Comment: 1-2 drinks a month  . Drug use: No  . Sexual activity: Yes    Birth control/protection: Patch  Lifestyle  . Physical activity:    Days per week: Not on file    Minutes per session: Not on file  .  Stress: Not on file  Relationships  . Social connections:    Talks on phone: Not on file    Gets together: Not on file    Attends religious service: Not on file    Active member of club or organization: Not on file    Attends meetings of clubs or organizations: Not on file    Relationship status: Not on file  . Intimate partner violence:    Fear of current or ex partner: Not on file    Emotionally abused: Not on file    Physically abused: Not on file    Forced sexual activity: Not on file  Other Topics Concern  . Not on file  Social History Narrative   Regular exercise: yes, walk   Caffeine use: occasionally tea   Fun: Social group events, walking her dogs, movies   Denies abuse and feels safe where she lives.      Current Outpatient Medications:  .  albuterol (PROVENTIL HFA;VENTOLIN HFA) 108 (90 Base) MCG/ACT inhaler, 2 puffs every 4-6 hours as directed rescue inhaler, Disp: 3 Inhaler, Rfl: 3 .  Alpha-Lipoic Acid 600 MG CAPS, Take 300 mg by mouth 2 (two) times daily. , Disp: , Rfl:  .  atenolol (TENORMIN) 50 MG tablet, TAKE TWO TABLETS BY MOUTH EVERY MORNING AND EVENING *MUST SCHEDULE APPOINTMENT WITH NEW PROVIDER FOR FUTURE REFILLS, PER MD*, Disp: 120 tablet, Rfl: 0 .  CYCLOSET 0.8 MG TABS, TAKE 3 TABLETS BY MOUTH DAILY IN THE MORNING, Disp: 270 tablet, Rfl: 0 .  Digestive Enzymes (ENZYME DIGEST PO), Take by mouth., Disp: , Rfl:  .  glipiZIDE (GLUCOTROL) 5 MG tablet, TAKE 1 TABLET BY MOUTH IN THE MORNING AND 2 TABLETS BEFORE DINNER, Disp: 270 tablet, Rfl: 0 .  glucose blood (ONE TOUCH ULTRA TEST) test strip, USE once a day ad advised., Disp: 100 each, Rfl: 3 .  irbesartan (AVAPRO) 75 MG tablet, TAKE 1 TABLET(75 MG) BY MOUTH DAILY, Disp: 30 tablet, Rfl: 0 .  medroxyPROGESTERone (PROVERA) 10 MG tablet, Take one tablet for 10 days of the moth if no spontaneous menses every 35 days, Disp: 10 tablet, Rfl: 11 .  metFORMIN (GLUCOPHAGE-XR) 500 MG 24 hr tablet, Take 1 tablet (500 mg total)  by mouth daily with breakfast., Disp: 60 tablet, Rfl: 5 .  methocarbamol (ROBAXIN) 500 MG tablet, Take 1 tablet (500 mg total) by mouth every 8 (eight) hours as needed for muscle spasms., Disp: 30 tablet, Rfl: 0 .  naphazoline-pheniramine (VISINE-A) 0.025-0.3 % ophthalmic solution, Place 1 drop into both eyes 2 (two) times daily., Disp: , Rfl:  .  naproxen (NAPROSYN) 500 MG tablet, Take 1 tablet (500 mg total) by mouth 2 (two) times daily with a meal. (Patient taking differently: Take 500 mg by mouth as needed (for migraines). ), Disp: 30 tablet, Rfl: 1 .  norelgestromin-ethinyl estradiol (XULANE) 150-35 MCG/24HR transdermal patch, UNWRAP AND APPLY 1 PATCH ONTO THE SKIN  AND CHANGE WEEKLY CONTINUOUSLY, Disp: 12 patch, Rfl: 2 .  OVER THE COUNTER MEDICATION, 1 capsule daily. Probiotic renew life, Disp: , Rfl:  .  OVER THE COUNTER MEDICATION, , Disp: , Rfl:  .  sertraline (ZOLOFT) 100 MG tablet, TAKE 2 TABLETS(200 MG) BY MOUTH DAILY.  Send prescription when due, Disp: 180 tablet, Rfl: 1  Allergies  Allergen Reactions  . Latex Itching and Swelling  . Fluticasone     Nose bleeds     ROS See HPI  Objective  Vitals:   01/07/18 1152  BP: 114/64  Pulse: (!) 56  Resp: 16  Temp: 98.4 F (36.9 C)  TempSrc: Oral  SpO2: 98%  Weight: 167 lb 12.8 oz (76.1 kg)  Height: 5\' 2"  (1.575 m)  HR stable  Body mass index is 30.69 kg/m.  Physical Exam Vital signs reviewed. Constitutional: Patient appears well-developed and well-nourished. No distress.  HENT: Head: Normocephalic and atraumatic. Nose: Nose normal. Mouth/Throat: Oropharynx is clear and moist. Eyes: Conjunctivae and EOM are normal. Pupils are equal, round, and reactive to light. No scleral icterus.  Neck: Normal range of motion. Neck supple. Cardiovascular: Normal rate, regular rhythm and normal heart sounds.  No murmur heard. No BLE edema. Distal pulses intact. Pulmonary/Chest: Effort normal and breath sounds normal. No respiratory  distress. Musculoskeletal: Normal range of motion.  No gross deformities Neurological: She is alert and oriented to person, place, and time. Coordination, balance, strength, speech and gait are normal.  Skin: Skin is warm and dry. No rash noted. No erythema.  Psychiatric: Patient has a normal mood and affect. behavior is normal. Judgment and thought content normal.    Assessment & Plan RTC in 1 month for CPE and F/U of HLD- lipid panel

## 2018-01-09 ENCOUNTER — Other Ambulatory Visit: Payer: Self-pay

## 2018-01-09 DIAGNOSIS — Z8669 Personal history of other diseases of the nervous system and sense organs: Secondary | ICD-10-CM

## 2018-01-09 MED ORDER — NAPROXEN 500 MG PO TABS: 500.0000 mg | ORAL_TABLET | Freq: Two times a day (BID) | ORAL | 1 refills | Status: DC

## 2018-01-17 ENCOUNTER — Other Ambulatory Visit: Payer: Self-pay | Admitting: Nurse Practitioner

## 2018-01-28 ENCOUNTER — Telehealth: Payer: Self-pay | Admitting: Internal Medicine

## 2018-01-28 ENCOUNTER — Other Ambulatory Visit: Payer: Self-pay

## 2018-01-28 MED ORDER — METFORMIN HCL ER 500 MG PO TB24: ORAL_TABLET | ORAL | 3 refills | Status: DC

## 2018-01-28 NOTE — Telephone Encounter (Signed)
metFORMIN (GLUCOPHAGE-XR) 500 MG 24 hr tablet   Patient stated that this was sent over for her today and because she just received a 90 supply that the pharamacy will not refill this  She would like a new prescription sent to  [facility] - [place], [place] - [address]

## 2018-01-29 ENCOUNTER — Other Ambulatory Visit: Payer: Self-pay

## 2018-01-29 MED ORDER — METFORMIN HCL ER 500 MG PO TB24: ORAL_TABLET | ORAL | 3 refills | Status: DC

## 2018-01-29 NOTE — Telephone Encounter (Signed)
This has been sent to the appropriate pharmacy

## 2018-02-03 ENCOUNTER — Other Ambulatory Visit: Payer: Self-pay

## 2018-02-03 DIAGNOSIS — I10 Essential (primary) hypertension: Secondary | ICD-10-CM

## 2018-02-03 MED ORDER — IRBESARTAN 75 MG PO TABS: ORAL_TABLET | ORAL | 0 refills | Status: DC

## 2018-02-07 ENCOUNTER — Encounter: Payer: Self-pay | Admitting: Nurse Practitioner

## 2018-02-11 ENCOUNTER — Other Ambulatory Visit (HOSPITAL_COMMUNITY): Payer: Self-pay | Admitting: Psychiatry

## 2018-02-18 ENCOUNTER — Ambulatory Visit (HOSPITAL_COMMUNITY): Payer: Self-pay | Admitting: Psychiatry

## 2018-02-19 ENCOUNTER — Ambulatory Visit (HOSPITAL_COMMUNITY): Payer: Self-pay | Admitting: Psychiatry

## 2018-02-21 ENCOUNTER — Other Ambulatory Visit: Payer: Self-pay | Admitting: Nurse Practitioner

## 2018-02-23 ENCOUNTER — Other Ambulatory Visit: Payer: Self-pay | Admitting: Obstetrics & Gynecology

## 2018-02-25 ENCOUNTER — Encounter: Payer: Self-pay | Admitting: Obstetrics & Gynecology

## 2018-02-25 ENCOUNTER — Ambulatory Visit (INDEPENDENT_AMBULATORY_CARE_PROVIDER_SITE_OTHER): Payer: PPO | Admitting: Obstetrics & Gynecology

## 2018-02-25 VITALS — BP 128/84 | Ht 61.0 in | Wt 164.0 lb

## 2018-02-25 DIAGNOSIS — Z3045 Encounter for surveillance of transdermal patch hormonal contraceptive device: Secondary | ICD-10-CM | POA: Diagnosis not present

## 2018-02-25 DIAGNOSIS — Z1151 Encounter for screening for human papillomavirus (HPV): Secondary | ICD-10-CM | POA: Diagnosis not present

## 2018-02-25 DIAGNOSIS — Z9189 Other specified personal risk factors, not elsewhere classified: Secondary | ICD-10-CM | POA: Diagnosis not present

## 2018-02-25 DIAGNOSIS — Z113 Encounter for screening for infections with a predominantly sexual mode of transmission: Secondary | ICD-10-CM | POA: Diagnosis not present

## 2018-02-25 DIAGNOSIS — Z01419 Encounter for gynecological examination (general) (routine) without abnormal findings: Secondary | ICD-10-CM

## 2018-02-25 DIAGNOSIS — N942 Vaginismus: Secondary | ICD-10-CM

## 2018-02-25 DIAGNOSIS — R87810 Cervical high risk human papillomavirus (HPV) DNA test positive: Secondary | ICD-10-CM

## 2018-02-25 MED ORDER — NORELGESTROMIN-ETH ESTRADIOL 150-35 MCG/24HR TD PTWK
1.0000 | MEDICATED_PATCH | TRANSDERMAL | 4 refills | Status: DC
Start: 2018-02-25 — End: 2019-05-11

## 2018-02-25 NOTE — Progress Notes (Signed)
[name] 03/01/1966 [id number]   History:    52 y.o. G0 Stable boyfriend x 2 years.  On disability for PTSD  RP:  Established patient presenting for annual gyn exam   HPI: Well on Xulane continuously.  No BTB.  No pelvic pain.  Normal vaginal secretions.  Vaginismus causing pain with IC.  Last pap neg, but HR HPV pos.  HPV 16-18-45 neg.  Colpo 02/2017 no dysplasia.  Urine wnl.  BM currently normal, but IBS.  Breasts wnl.  BMI 30.99.  Health labs with Fam MD.  Past medical history,surgical history, family history and social history were all reviewed and documented in the EPIC chart.  Gynecologic History No LMP recorded. (Menstrual status: Perimenopausal). Contraception: Ortho-Evra patches weekly Last Pap: 02/2017. Results were: Negative, but HR HPV pos.  HPV 16-18-45 neg.  Colpo 02/2017 no dysplasia. Last mammogram: 07/2012. Results were: Negative Bone Density: Never Colonoscopy: Per patient, done with Dr Loreta Ave about 5 yrs ago, but bowel prep sub-optimal.  Obstetric History OB History  Gravida Para Term Preterm AB Living  0 0 0 0 0 0  SAB TAB Ectopic Multiple Live Births  0 0 0 0       ROS: A ROS was performed and pertinent positives and negatives are included in the history.  GENERAL: No fevers or chills. HEENT: No change in vision, no earache, sore throat or sinus congestion. NECK: No pain or stiffness. CARDIOVASCULAR: No chest pain or pressure. No palpitations. PULMONARY: No shortness of breath, cough or wheeze. GASTROINTESTINAL: No abdominal pain, nausea, vomiting or diarrhea, melena or bright red blood per rectum. GENITOURINARY: No urinary frequency, urgency, hesitancy or dysuria. MUSCULOSKELETAL: No joint or muscle pain, no back pain, no recent trauma. DERMATOLOGIC: No rash, no itching, no lesions. ENDOCRINE: No polyuria, polydipsia, no heat or cold intolerance. No recent change in weight. HEMATOLOGICAL: No anemia or easy bruising or bleeding. NEUROLOGIC: No headache,  seizures, numbness, tingling or weakness. PSYCHIATRIC: No depression, no loss of interest in normal activity or change in sleep pattern.     Exam:   BP 128/84   Ht 5\' 1"  (1.549 m)   Wt 164 lb (74.4 kg)   BMI 30.99 kg/m   Body mass index is 30.99 kg/m.  General appearance : Well developed well nourished female. No acute distress HEENT: Eyes: no retinal hemorrhage or exudates,  Neck supple, trachea midline, no carotid bruits, no thyroidmegaly Lungs: Clear to auscultation, no rhonchi or wheezes, or rib retractions  Heart: Regular rate and rhythm, no murmurs or gallops Breast:Examined in sitting and supine position were symmetrical in appearance, no palpable masses or tenderness,  no skin retraction, no nipple inversion, no nipple discharge, no skin discoloration, no axillary or supraclavicular lymphadenopathy Abdomen: no palpable masses or tenderness, no rebound or guarding Extremities: no edema or skin discoloration or tenderness  Pelvic: Vulva: Normal             Vagina: No gross lesions or discharge  Cervix: No gross lesions or discharge.  Pap/HR HPV.  Gono-Chlam done.  Uterus  AV, normal size, shape and consistency, non-tender and mobile  Adnexa  Without masses or tenderness  Anus: Normal   Assessment/Plan:  52 y.o. female for annual exam   1. Encounter for routine gynecological examination with Papanicolaou smear of cervix Normal gynecologic exam.  Pap with high-risk HPV done.  Breast exam normal.  Will schedule screening mammogram now.  Body mass index 30.99.  Recommend a low calorie/carb diet with  increased physical activity, and aerobic activity 5 times a week and weightlifting every 2 days.  Health labs with family physician.  Refer to gastroenterology for screening colonoscopy.  2. Cervical high risk HPV (human papillomavirus) test positive High risk HPV positive on last Pap test June 2018.  HPV 16-18-40 5 negative.  Colposcopy showed no dysplasia.  Pap test with high risk  HPV repeated today.  3. Encounter for surveillance of transdermal patch hormonal contraceptive device Doing well on the contraceptive patch.  No contraindication.  Xulane patch represcribed.  4. Screen for STD (sexually transmitted disease) Recommend strict condom use. Gonorrhea and Chlamydia done on the Pap test.  5. Vaginismus Counseling done on vaginismus.  Refer to physical therapy to work on relaxation of the pelvic floor.  Other orders - norelgestromin-ethinyl estradiol Burr Medico(XULANE) 150-35 MCG/24HR transdermal patch; Place 1 patch onto the skin once a week. Continuous use  Counseling on above issues and coordination of care more than 50% for 10 minutes.  Genia DelMarie-Lyne Nichalos Brenton MD, 11:15 AM 02/25/2018

## 2018-02-26 ENCOUNTER — Telehealth: Payer: Self-pay | Admitting: *Deleted

## 2018-02-26 DIAGNOSIS — N8189 Other female genital prolapse: Secondary | ICD-10-CM

## 2018-02-26 DIAGNOSIS — Z1211 Encounter for screening for malignant neoplasm of colon: Secondary | ICD-10-CM
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
## 2018-02-26 NOTE — Telephone Encounter (Signed)
1. Referral placed at [facility] they will call to schedule.  2. Referral placed for [facility] they will call to schedule.

## 2018-02-27 LAB — PAP IG, CT-NG NAA, HPV HIGH-RISK
C. trachomatis RNA, TMA: DETECTED — AB
HPV DNA High Risk: NOT DETECTED
N. gonorrhoeae RNA, TMA: NOT DETECTED

## 2018-03-01 ENCOUNTER — Encounter: Payer: Self-pay | Admitting: Obstetrics & Gynecology

## 2018-03-01 NOTE — Patient Instructions (Signed)
1. Encounter for routine gynecological examination with Papanicolaou smear of cervix Normal gynecologic exam.  Pap with high-risk HPV done.  Breast exam normal.  Will schedule screening mammogram now.  Body mass index 30.99.  Recommend a low calorie/carb diet with increased physical activity, and aerobic activity 5 times a week and weightlifting every 2 days.  Health labs with family physician.  Refer to gastroenterology for screening colonoscopy.  2. Cervical high risk HPV (human papillomavirus) test positive High risk HPV positive on last Pap test June 2018.  HPV 16-18-40 5 negative.  Colposcopy showed no dysplasia.  Pap test with high risk HPV repeated today.  3. Encounter for surveillance of transdermal patch hormonal contraceptive device Doing well on the contraceptive patch.  No contraindication.  Xulane patch represcribed.  4. Screen for STD (sexually transmitted disease) Recommend strict condom use. Gonorrhea and Chlamydia done on the Pap test.  5. Vaginismus Counseling done on vaginismus.  Refer to physical therapy to work on relaxation of the pelvic floor.  Other orders - norelgestromin-ethinyl estradiol [facility](XULANE) 150-35 MCG/24HR transdermal patch; Place 1 patch onto the skin once a week. Continuous use  Huntley DecSara, it was a pleasure  meeting you today!  I will inform you of your results as soon as they are available.

## 2018-03-02 ENCOUNTER — Other Ambulatory Visit: Payer: Self-pay | Admitting: Internal Medicine

## 2018-03-03 ENCOUNTER — Telehealth: Payer: Self-pay | Admitting: Internal Medicine

## 2018-03-03 NOTE — Telephone Encounter (Signed)
This was done this morning  

## 2018-03-03 NOTE — Telephone Encounter (Signed)
Yes, ok 

## 2018-03-03 NOTE — Telephone Encounter (Signed)
CYCLOSET 0.8 MG TABS  Patient stated she had [facility] faxed over refill requests.  And needs this sent over to the pharmacy      [facility] [phone] - [place], [place] - [address] [facility] [address]

## 2018-03-03 NOTE — Telephone Encounter (Signed)
Is this okay to refill? 

## 2018-03-04 ENCOUNTER — Ambulatory Visit: Payer: Self-pay | Admitting: Internal Medicine

## 2018-03-04 NOTE — Telephone Encounter (Signed)
1. Patient scheduled on 03/13/18  2. Aquilla left message for pt to call.

## 2018-03-07 ENCOUNTER — Other Ambulatory Visit: Payer: Self-pay | Admitting: Obstetrics & Gynecology

## 2018-03-07 MED ORDER — AZITHROMYCIN 500 MG PO TABS: 1000.0000 mg | ORAL_TABLET | Freq: Once | ORAL | 0 refills | Status: AC

## 2018-03-11 ENCOUNTER — Other Ambulatory Visit: Payer: Self-pay

## 2018-03-11 DIAGNOSIS — I10 Essential (primary) hypertension: Secondary | ICD-10-CM

## 2018-03-11 MED ORDER — IRBESARTAN 75 MG PO TABS: ORAL_TABLET | ORAL | 0 refills | Status: DC

## 2018-03-13 ENCOUNTER — Other Ambulatory Visit (INDEPENDENT_AMBULATORY_CARE_PROVIDER_SITE_OTHER): Payer: PPO

## 2018-03-13 ENCOUNTER — Encounter: Payer: Self-pay | Admitting: Nurse Practitioner

## 2018-03-13 ENCOUNTER — Ambulatory Visit (INDEPENDENT_AMBULATORY_CARE_PROVIDER_SITE_OTHER): Payer: PPO | Admitting: Nurse Practitioner

## 2018-03-13 VITALS — BP 134/78 | HR 75 | Temp 98.8°F | Resp 16 | Ht 61.0 in | Wt 165.8 lb

## 2018-03-13 DIAGNOSIS — E785 Hyperlipidemia, unspecified: Secondary | ICD-10-CM

## 2018-03-13 DIAGNOSIS — Z0000 Encounter for general adult medical examination without abnormal findings: Secondary | ICD-10-CM | POA: Diagnosis not present

## 2018-03-13 DIAGNOSIS — Z1231 Encounter for screening mammogram for malignant neoplasm of breast: Secondary | ICD-10-CM

## 2018-03-13 DIAGNOSIS — I10 Essential (primary) hypertension: Secondary | ICD-10-CM

## 2018-03-13 DIAGNOSIS — Z1239 Encounter for other screening for malignant neoplasm of breast: Secondary | ICD-10-CM

## 2018-03-13 LAB — COMPREHENSIVE METABOLIC PANEL
ALT: 10 U/L (ref 0–35)
AST: 13 U/L (ref 0–37)
Albumin: 3.8 g/dL (ref 3.5–5.2)
Alkaline Phosphatase: 59 U/L (ref 39–117)
BILIRUBIN TOTAL: 0.4 mg/dL (ref 0.2–1.2)
BUN: 16 mg/dL (ref 6–23)
CO2: 28 mEq/L (ref 19–32)
CREATININE: 0.81 mg/dL (ref 0.40–1.20)
Calcium: 9.2 mg/dL (ref 8.4–10.5)
Chloride: 97 mEq/L (ref 96–112)
GFR: 79.47 mL/min (ref >60.00)
Glucose, Bld: 150 mg/dL — ABNORMAL HIGH (ref 70–99)
Potassium: 3.9 mEq/L (ref 3.5–5.1)
SODIUM: 133 meq/L — AB (ref 135–145)
TOTAL PROTEIN: 7.2 g/dL (ref 6.0–8.3)

## 2018-03-13 LAB — LIPID PANEL
CHOL/HDL RATIO: 3
Cholesterol: 311 mg/dL — ABNORMAL HIGH (ref 0–200)
HDL: 106.4 mg/dL (ref >39.00)
LDL CALC: 166 mg/dL — AB (ref 0–99)
NONHDL: 204.96
Triglycerides: 196 mg/dL — ABNORMAL HIGH (ref 0.0–149.0)
VLDL: 39.2 mg/dL (ref 0.0–40.0)

## 2018-03-13 LAB — CBC
HCT: 33.3 % — ABNORMAL LOW (ref 36.0–46.0)
Hemoglobin: 10.9 g/dL — ABNORMAL LOW (ref 12.0–15.0)
MCHC: 32.9 g/dL (ref 30.0–36.0)
MCV: 85.5 fl (ref 78.0–100.0)
Platelets: 326 10*3/uL (ref 150.0–400.0)
RBC: 3.89 Mil/uL (ref 3.87–5.11)
RDW: 15.5 % (ref 11.5–15.5)
WBC: 12.6 10*3/uL — AB (ref 4.0–10.5)

## 2018-03-13 LAB — TSH: TSH: 4.8 u[IU]/mL — AB (ref 0.35–4.50)

## 2018-03-13 NOTE — Assessment & Plan Note (Signed)
Stable Continue current medications Continue to monitor - CBC; Future - Comprehensive metabolic panel; Future - TSH; Future

## 2018-03-13 NOTE — Assessment & Plan Note (Signed)
Update lipid panel F/U with further recommendations pending lab results-will need to consider cost in any recommendations given Recommended starting 81mg  asa daily for ASCVD reduction She is fasting - Lipid panel; Future

## 2018-03-13 NOTE — Progress Notes (Signed)
Name: Abigail CoveSara E Penkala   MRN: [id number]    DOB: 1966-09-07   Date:03/13/2018       Progress Note  Subjective  Chief Complaint  Chief Complaint  Patient presents with  . CPE    Fasting    HPI  Patient presents for annual CPE.  Diet, Exercise: tries to follow a healthy diabetic diet, walks dogs daily  USPSTF grade A and B recommendations  Depression: no concerns today, follows routinely with psychiatry Depression screen [employer] 2/9 02/05/2017 10/18/2016 01/24/2016  Decreased Interest 0 0 0  Down, Depressed, Hopeless 0 1 0  PHQ - 2 Score 0 1 0  Some recent data might be hidden   Hypertension -maintained on atenolol 50BID, irbesartan 75 once daily Reports daily medication compliance without adverse medication effects. Reports she does not routinely check her BP readings at home, but routinely has it checked at medical appointments  BP Readings from Last 3 Encounters:  03/13/18 134/78  02/25/18 128/84  01/07/18 114/64   Obesity: Wt Readings from Last 3 Encounters:  03/13/18 165 lb 12.8 oz (75.2 kg)  02/25/18 164 lb (74.4 kg)  01/07/18 167 lb 12.8 oz (76.1 kg)   BMI Readings from Last 3 Encounters:  03/13/18 31.33 kg/m  02/25/18 30.99 kg/m  01/07/18 30.69 kg/m    Alcohol: no Tobacco use: no,never  HIV screening: declines  STD testing and prevention (chl/gon/syphilis): no concerns, declines Intimate partner violence: denies  Vaccinations: up to date  Advanced Care Planning: A voluntary discussion about advance care planning including the explanation and discussion of advance directives.  Discussed health care proxy and Living will, and the patient DOES NOT have a living will at present time. If patient does have living will, I have requested they bring this to the clinic to be scanned in to their chart.  Breast cancer: mammogram overdue, ordered today Cervical cancer screening: follows routinely with GYN for womens care   Lipids: maintained off medications with known hx  HLD due to her preference, she says mostly due to cost and being on limited income Lab Results  Component Value Date   CHOL 282 (H) 02/22/2017   CHOL 373 (H) 01/24/2016   CHOL 228 (H) 08/26/2015   Lab Results  Component Value Date   HDL 101.30 02/22/2017   HDL 128.20 01/24/2016   HDL 82.60 08/26/2015   Lab Results  Component Value Date   LDLCALC 153 (H) 02/22/2017   LDLCALC 114 (H) 08/26/2015   LDLCALC 85 04/02/2014   Lab Results  Component Value Date   TRIG 137.0 02/22/2017   TRIG 205.0 (H) 01/24/2016   TRIG 156.0 (H) 08/26/2015   Lab Results  Component Value Date   CHOLHDL 3 02/22/2017   CHOLHDL 3 01/24/2016   CHOLHDL 3 08/26/2015   Lab Results  Component Value Date   LDLDIRECT 205.0 01/24/2016    Glucose: followed routinely by endocrinology for DM Glucose, Bld  Date Value Ref Range Status  02/22/2017 166 (H) 70 - 99 mg/dL Final  30/86/578405/05/2016 696154 (H) 70 - 99 mg/dL Final  29/52/841312/05/2015 244160 (H) 70 - 99 mg/dL Final   Glucose-Capillary  Date Value Ref Range Status  12/03/2010 180 (H) 70 - 99 mg/dL Final  01/02/725312/14/2011 664115 (H) 70 - 99 mg/dL Final    Skin cancer: routinely wears sunscreen Colorectal cancer: No bowel changes, no rectal bleeding. Declines colonoscopy but is interested in cologuard.  Aspirin: not taking, recommended starting 81mg  asa daily ECG: not indicated  Patient Active Problem  List   Diagnosis Date Noted  . ASCUS with positive high risk HPV cervical 03/13/2017  . Type 2 diabetes mellitus with hyperglycemia, without long-term current use of insulin (HCC) 03/28/2016  . Oligomenorrhea 02/22/2016  . Medicare annual wellness visit, subsequent 01/24/2016  . Anemia 04/14/2014  . Hyperlipidemia 08/18/2013  . Essential hypertension 08/26/2012  . Irregular menses 08/07/2012  . History of abnormal Pap smear 08/07/2012  . GERD (gastroesophageal reflux disease) 08/07/2012  . Asthma, mild persistent 08/07/2012  . Migraine 08/07/2012  . History of PCOS  08/07/2012  . Genital warts 08/07/2012  . S/P cholecystectomy 08/07/2012  . Seasonal and perennial allergic rhinitis 08/07/2012  . Severe episode of recurrent major depressive disorder (HCC) 12/06/2011  . PTSD (post-traumatic stress disorder) 11/02/2011    Past Surgical History:  Procedure Laterality Date  . CHOLECYSTECTOMY    . GALLBLADDER SURGERY      Family History  Problem Relation Age of Onset  . Bipolar disorder Mother   . Hypertension Mother   . Diabetes Mother   . Alzheimer's disease Father   . Hypertension Father   . Alzheimer's disease Maternal Grandmother   . Heart disease Paternal Grandfather     Social History   Socioeconomic History  . Marital status: Single    Spouse name: Not on file  . Number of children: 0  . Years of education: 43  . Highest education level: Not on file  Occupational History  . Occupation: Long Term Disability    Comment: PTSD  Social Needs  . Financial resource strain: Not on file  . Food insecurity:    Worry: Not on file    Inability: Not on file  . Transportation needs:    Medical: Not on file    Non-medical: Not on file  Tobacco Use  . Smoking status: Never Smoker  . Smokeless tobacco: Never Used  Substance and Sexual Activity  . Alcohol use: Yes    Alcohol/week: 0.3 oz    Types: 1 Standard drinks or equivalent per week    Comment: 1-2 drinks a month  . Drug use: No  . Sexual activity: Yes    Partners: Male    Birth control/protection: Patch    Comment: 1st intercourse- 16, partners- 8, current partner- 9yrs  Lifestyle  . Physical activity:    Days per week: Not on file    Minutes per session: Not on file  . Stress: Not on file  Relationships  . Social connections:    Talks on phone: Not on file    Gets together: Not on file    Attends religious service: Not on file    Active member of club or organization: Not on file    Attends meetings of clubs or organizations: Not on file    Relationship status: Not on  file  . Intimate partner violence:    Fear of current or ex partner: Not on file    Emotionally abused: Not on file    Physically abused: Not on file    Forced sexual activity: Not on file  Other Topics Concern  . Not on file  Social History Narrative   Regular exercise: yes, walk   Caffeine use: occasionally tea   Fun: Social group events, walking her dogs, movies   Denies abuse and feels safe where she lives.      Current Outpatient Medications:  .  albuterol (PROVENTIL HFA;VENTOLIN HFA) 108 (90 Base) MCG/ACT inhaler, 2 puffs every 4-6 hours as directed rescue inhaler,  Disp: 3 Inhaler, Rfl: 3 .  Alpha-Lipoic Acid 600 MG CAPS, Take 300 mg by mouth 2 (two) times daily. , Disp: , Rfl:  .  atenolol (TENORMIN) 50 MG tablet, Take 1 tablet (50 mg total) by mouth 2 (two) times daily., Disp: 180 tablet, Rfl: 1 .  CYCLOSET 0.8 MG TABS, TAKE 3 TABLETS BY MOUTH DAILY IN THE MORNING, Disp: 270 tablet, Rfl: 0 .  Digestive Enzymes (ENZYME DIGEST PO), Take by mouth., Disp: , Rfl:  .  glipiZIDE (GLUCOTROL) 5 MG tablet, TAKE 1 TABLET BY MOUTH IN THE MORNING AND 2 TABLETS BEFORE DINNER, Disp: 270 tablet, Rfl: 0 .  glucose blood (ONE TOUCH ULTRA TEST) test strip, USE once a day ad advised., Disp: 100 each, Rfl: 3 .  irbesartan (AVAPRO) 75 MG tablet, TAKE 1 TABLET(75 MG) BY MOUTH DAILY, Disp: 90 tablet, Rfl: 0 .  medroxyPROGESTERone (PROVERA) 10 MG tablet, Take one tablet for 10 days of the moth if no spontaneous menses every 35 days, Disp: 10 tablet, Rfl: 11 .  metFORMIN (GLUCOPHAGE-XR) 500 MG 24 hr tablet, Take 2 tablets daily, Disp: 180 tablet, Rfl: 3 .  methocarbamol (ROBAXIN) 500 MG tablet, Take 1 tablet (500 mg total) by mouth every 8 (eight) hours as needed for muscle spasms., Disp: 30 tablet, Rfl: 0 .  naphazoline-pheniramine (VISINE-A) 0.025-0.3 % ophthalmic solution, Place 1 drop into both eyes 2 (two) times daily., Disp: , Rfl:  .  naproxen (NAPROSYN) 500 MG tablet, Take 1 tablet (500 mg total)  by mouth 2 (two) times daily with a meal., Disp: 30 tablet, Rfl: 1 .  norelgestromin-ethinyl estradiol (XULANE) 150-35 MCG/24HR transdermal patch, Place 1 patch onto the skin once a week. Continuous use, Disp: 12 patch, Rfl: 4 .  OVER THE COUNTER MEDICATION, 1 capsule daily. Probiotic renew life, Disp: , Rfl:  .  OVER THE COUNTER MEDICATION, , Disp: , Rfl:  .  sertraline (ZOLOFT) 100 MG tablet, TAKE 2 TABLETS BY MOUTH DAILY, Disp: 180 tablet, Rfl: 0  Allergies  Allergen Reactions  . Latex Itching and Swelling  . Fluticasone     Nose bleeds     ROS  Constitutional: Negative for fever or weight change.  Respiratory: Negative for cough and shortness of breath.   Cardiovascular: Negative for chest pain or palpitations.  Gastrointestinal: Negative for abdominal pain, no bowel changes.  Musculoskeletal: Negative for gait problem or joint swelling.  Skin: Negative for rash.  Neurological: Negative for dizziness or headache.  No other specific complaints in a complete review of systems (except as listed in HPI above).   Objective  Vitals:   03/13/18 1112  BP: 134/78  Pulse: 75  Resp: 16  Temp: 98.8 F (37.1 C)  TempSrc: Oral  SpO2: 96%  Weight: 165 lb 12.8 oz (75.2 kg)  Height: 5\' 1"  (1.549 m)    Body mass index is 31.33 kg/m.  Physical Exam Vital signs reviewed. Constitutional: Patient appears well-developed and well-nourished. No distress.  HENT: Head: Normocephalic and atraumatic. Ears: B TMs ok, no erythema or effusion; Nose: Nose normal. Mouth/Throat: Oropharynx is clear and moist. No oropharyngeal exudate.  Eyes: Conjunctivae and EOM are normal. Pupils are equal, round, and reactive to light. No scleral icterus.  Neck: Normal range of motion. Neck supple. No cervical adenopathy. No thyromegaly present.  Cardiovascular: Normal rate, regular rhythm and normal heart sounds.  No murmur heard. No BLE edema. Distal pulses intact. Pulmonary/Chest: Effort normal and breath  sounds normal. No respiratory distress. Abdominal: Soft.  Bowel sounds are normal, no distension. There is no tenderness. no masses Breast: defd to mammo FEMALE GENITALIA:  defd to GYN Musculoskeletal: Normal range of motion, No gross deformities Neurological: he is alert and oriented to person, place, and time. No cranial nerve deficit. Coordination, balance, strength, speech and gait are normal.  Skin: Skin is warm and dry. No rash noted. No erythema.  Psychiatric: Patient has a normal mood and affect. behavior is normal. Judgment and thought content normal.  Assessment & Plan RTC in 1 year for CPE, or sooner if needed.

## 2018-03-13 NOTE — Patient Instructions (Addendum)
Please head downstairs for lab work. If any of your test results are critically abnormal, you will be contacted right away. Otherwise, I will contact you within a week about your test results and any recommendations for abnormalities.  Please check your insurance for cologuard and let us know if you are covered so we can order the test. If you are not covered, I would highly recommend you have a colonoscopy to screen for colon cancer.  I will plan to see you back in 1 year for your annual physical, or sooner if needed.  Health Maintenance, Female Adopting a healthy lifestyle and getting preventive care can go a long way to promote health and wellness. Talk with your health care provider about what schedule of regular examinations is right for you. This is a good chance for you to check in with your provider about disease prevention and staying healthy. In between checkups, there are plenty of things you can do on your own. Experts have done a lot of research about which lifestyle changes and preventive measures are most likely to keep you healthy. Ask your health care provider for more information. Weight and diet Eat a healthy diet  Be sure to include plenty of vegetables, fruits, low-fat dairy products, and lean protein.  Do not eat a lot of foods high in solid fats, added sugars, or salt.  Get regular exercise. This is one of the most important things you can do for your health. ? Most adults should exercise for at least 150 minutes each week. The exercise should increase your heart rate and make you sweat (moderate-intensity exercise). ? Most adults should also do strengthening exercises at least twice a week. This is in addition to the moderate-intensity exercise.  Maintain a healthy weight  Body mass index (BMI) is a measurement that can be used to identify possible weight problems. It estimates body fat based on height and weight. Your health care provider can help determine your BMI  and help you achieve or maintain a healthy weight.  For females 20 years of age and older: ? A BMI below 18.5 is considered underweight. ? A BMI of 18.5 to 24.9 is normal. ? A BMI of 25 to 29.9 is considered overweight. ? A BMI of 30 and above is considered obese.  Watch levels of cholesterol and blood lipids  You should start having your blood tested for lipids and cholesterol at 50 years of age, then have this test every 5 years.  You may need to have your cholesterol levels checked more often if: ? Your lipid or cholesterol levels are high. ? You are older than 50 years of age. ? You are at high risk for heart disease.  Cancer screening Lung Cancer  Lung cancer screening is recommended for adults 55-80 years old who are at high risk for lung cancer because of a history of smoking.  A yearly low-dose CT scan of the lungs is recommended for people who: ? Currently smoke. ? Have quit within the past 15 years. ? Have at least a 30-pack-year history of smoking. A pack year is smoking an average of one pack of cigarettes a day for 1 year.  Yearly screening should continue until it has been 15 years since you quit.  Yearly screening should stop if you develop a health problem that would prevent you from having lung cancer treatment.  Breast Cancer  Practice breast self-awareness. This means understanding how your breasts normally appear and feel.  It also   means doing regular breast self-exams. Let your health care provider know about any changes, no matter how small.  If you are in your 20s or 30s, you should have a clinical breast exam (CBE) by a health care provider every 1-3 years as part of a regular health exam.  If you are 40 or older, have a CBE every year. Also consider having a breast X-ray (mammogram) every year.  If you have a family history of breast cancer, talk to your health care provider about genetic screening.  If you are at high risk for breast cancer, talk  to your health care provider about having an MRI and a mammogram every year.  Breast cancer gene (BRCA) assessment is recommended for women who have family members with BRCA-related cancers. BRCA-related cancers include: ? Breast. ? Ovarian. ? Tubal. ? Peritoneal cancers.  Results of the assessment will determine the need for genetic counseling and BRCA1 and BRCA2 testing.  Cervical Cancer Your health care provider may recommend that you be screened regularly for cancer of the pelvic organs (ovaries, uterus, and vagina). This screening involves a pelvic examination, including checking for microscopic changes to the surface of your cervix (Pap test). You may be encouraged to have this screening done every 3 years, beginning at age 21.  For women ages 30-65, health care providers may recommend pelvic exams and Pap testing every 3 years, or they may recommend the Pap and pelvic exam, combined with testing for human papilloma virus (HPV), every 5 years. Some types of HPV increase your risk of cervical cancer. Testing for HPV may also be done on women of any age with unclear Pap test results.  Other health care providers may not recommend any screening for nonpregnant women who are considered low risk for pelvic cancer and who do not have symptoms. Ask your health care provider if a screening pelvic exam is right for you.  If you have had past treatment for cervical cancer or a condition that could lead to cancer, you need Pap tests and screening for cancer for at least 20 years after your treatment. If Pap tests have been discontinued, your risk factors (such as having a new sexual partner) need to be reassessed to determine if screening should resume. Some women have medical problems that increase the chance of getting cervical cancer. In these cases, your health care provider may recommend more frequent screening and Pap tests.  Colorectal Cancer  This type of cancer can be detected and often  prevented.  Routine colorectal cancer screening usually begins at 50 years of age and continues through 50 years of age.  Your health care provider may recommend screening at an earlier age if you have risk factors for colon cancer.  Your health care provider may also recommend using home test kits to check for hidden blood in the stool.  A small camera at the end of a tube can be used to examine your colon directly (sigmoidoscopy or colonoscopy). This is done to check for the earliest forms of colorectal cancer.  Routine screening usually begins at age 50.  Direct examination of the colon should be repeated every 5-10 years through 50 years of age. However, you may need to be screened more often if early forms of precancerous polyps or small growths are found.  Skin Cancer  Check your skin from head to toe regularly.  Tell your health care provider about any new moles or changes in moles, especially if there is a change in   a mole's shape or color.  Also tell your health care provider if you have a mole that is larger than the size of a pencil eraser.  Always use sunscreen. Apply sunscreen liberally and repeatedly throughout the day.  Protect yourself by wearing long sleeves, pants, a wide-brimmed hat, and sunglasses whenever you are outside.  Heart disease, diabetes, and high blood pressure  High blood pressure causes heart disease and increases the risk of stroke. High blood pressure is more likely to develop in: ? People who have blood pressure in the high end of the normal range (130-139/85-89 mm Hg). ? People who are overweight or obese. ? People who are African American.  If you are 42-58 years of age, have your blood pressure checked every 3-5 years. If you are 58 years of age or older, have your blood pressure checked every year. You should have your blood pressure measured twice-once when you are at a hospital or clinic, and once when you are not at a hospital or clinic.  Record the average of the two measurements. To check your blood pressure when you are not at a hospital or clinic, you can use: ? An automated blood pressure machine at a pharmacy. ? A home blood pressure monitor.  If you are between 55 years and 78 years old, ask your health care provider if you should take aspirin to prevent strokes.  Have regular diabetes screenings. This involves taking a blood sample to check your fasting blood sugar level. ? If you are at a normal weight and have a low risk for diabetes, have this test once every three years after 50 years of age. ? If you are overweight and have a high risk for diabetes, consider being tested at a younger age or more often. Preventing infection Hepatitis B  If you have a higher risk for hepatitis B, you should be screened for this virus. You are considered at high risk for hepatitis B if: ? You were born in a country where hepatitis B is common. Ask your health care provider which countries are considered high risk. ? Your parents were born in a high-risk country, and you have not been immunized against hepatitis B (hepatitis B vaccine). ? You have HIV or AIDS. ? You use needles to inject street drugs. ? You live with someone who has hepatitis B. ? You have had sex with someone who has hepatitis B. ? You get hemodialysis treatment. ? You take certain medicines for conditions, including cancer, organ transplantation, and autoimmune conditions.  Hepatitis C  Blood testing is recommended for: ? Everyone born from [birth year] through 1963. ? Anyone with known risk factors for hepatitis C.  Sexually transmitted infections (STIs)  You should be screened for sexually transmitted infections (STIs) including gonorrhea and chlamydia if: ? You are sexually active and are younger than 50 years of age. ? You are older than 51 years of age and your health care provider tells you that you are at risk for this type of infection. ? Your sexual  activity has changed since you were last screened and you are at an increased risk for chlamydia or gonorrhea. Ask your health care provider if you are at risk.  If you do not have HIV, but are at risk, it may be recommended that you take a prescription medicine daily to prevent HIV infection. This is called pre-exposure prophylaxis (PrEP). You are considered at risk if: ? You are sexually active and do not regularly use condoms  or know the HIV status of your partner(s). ? You take drugs by injection. ? You are sexually active with a partner who has HIV.  Talk with your health care provider about whether you are at high risk of being infected with HIV. If you choose to begin PrEP, you should first be tested for HIV. You should then be tested every 3 months for as long as you are taking PrEP. Pregnancy  If you are premenopausal and you may become pregnant, ask your health care provider about preconception counseling.  If you may become pregnant, take 400 to 800 micrograms (mcg) of folic acid every day.  If you want to prevent pregnancy, talk to your health care provider about birth control (contraception). Osteoporosis and menopause  Osteoporosis is a disease in which the bones lose minerals and strength with aging. This can result in serious bone fractures. Your risk for osteoporosis can be identified using a bone density scan.  If you are 65 years of age or older, or if you are at risk for osteoporosis and fractures, ask your health care provider if you should be screened.  Ask your health care provider whether you should take a calcium or vitamin D supplement to lower your risk for osteoporosis.  Menopause may have certain physical symptoms and risks.  Hormone replacement therapy may reduce some of these symptoms and risks. Talk to your health care provider about whether hormone replacement therapy is right for you. Follow these instructions at home:  Schedule regular health, dental,  and eye exams.  Stay current with your immunizations.  Do not use any tobacco products including cigarettes, chewing tobacco, or electronic cigarettes.  If you are pregnant, do not drink alcohol.  If you are breastfeeding, limit how much and how often you drink alcohol.  Limit alcohol intake to no more than 1 drink per day for nonpregnant women. One drink equals 12 ounces of beer, 5 ounces of wine, or 1 ounces of hard liquor.  Do not use street drugs.  Do not share needles.  Ask your health care provider for help if you need support or information about quitting drugs.  Tell your health care provider if you often feel depressed.  Tell your health care provider if you have ever been abused or do not feel safe at home. This information is not intended to replace advice given to you by your health care provider. Make sure you discuss any questions you have with your health care provider. Document Released: 03/19/2011 Document Revised: 02/09/2016 Document Reviewed: 06/07/2015 Elsevier Interactive Patient Education  2018 [employer]  

## 2018-03-13 NOTE — Assessment & Plan Note (Signed)
-  USPSTF grade A and B recommendations reviewed with patient; age-appropriate recommendations, preventive care, screening tests, etc discussed and encouraged; healthy living and sunscreen use encouraged; see AVS for patient education given to patient. Advanced directives packet given -Discussed importance of 150 minutes of physical activity weekly, eat 6 servings of fruit/vegetables daily and drink plenty of water and avoid sweet beverages.  -Follow up and care instructions discussed and provided in AVS. -Reviewed Health Maintenance: - MM DIGITAL SCREENING BILATERAL; Future Screening for breast cancer -She was instructed to verify insurance coverage for cologuard and notify our office, will recommend screening colonoscopy if she is not eligible for cologuard -follows with endocrinology for routine DM care

## 2018-03-17 ENCOUNTER — Ambulatory Visit (INDEPENDENT_AMBULATORY_CARE_PROVIDER_SITE_OTHER): Payer: PPO | Admitting: Psychiatry

## 2018-03-17 ENCOUNTER — Other Ambulatory Visit: Payer: Self-pay

## 2018-03-17 ENCOUNTER — Encounter (HOSPITAL_COMMUNITY): Payer: Self-pay | Admitting: Psychiatry

## 2018-03-17 VITALS — BP 124/84 | HR 60 | Ht 61.0 in | Wt 164.0 lb

## 2018-03-17 DIAGNOSIS — F3341 Major depressive disorder, recurrent, in partial remission: Secondary | ICD-10-CM | POA: Diagnosis not present

## 2018-03-17 DIAGNOSIS — F4310 Post-traumatic stress disorder, unspecified: Secondary | ICD-10-CM | POA: Diagnosis not present

## 2018-03-17 DIAGNOSIS — F411 Generalized anxiety disorder: Secondary | ICD-10-CM | POA: Diagnosis not present

## 2018-03-17 MED ORDER — SERTRALINE HCL 100 MG PO TABS: ORAL_TABLET | ORAL | 0 refills | Status: DC
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
## 2018-03-20 ENCOUNTER — Other Ambulatory Visit: Payer: Self-pay | Admitting: Internal Medicine

## 2018-03-28 ENCOUNTER — Ambulatory Visit (INDEPENDENT_AMBULATORY_CARE_PROVIDER_SITE_OTHER): Payer: PPO | Admitting: Internal Medicine

## 2018-03-28 ENCOUNTER — Encounter: Payer: Self-pay | Admitting: Obstetrics & Gynecology

## 2018-03-28 ENCOUNTER — Encounter: Payer: Self-pay | Admitting: Internal Medicine

## 2018-03-28 VITALS — BP 137/70 | HR 58 | Ht 61.0 in | Wt 160.6 lb

## 2018-03-28 DIAGNOSIS — E1165 Type 2 diabetes mellitus with hyperglycemia: Secondary | ICD-10-CM | POA: Diagnosis not present

## 2018-03-28 DIAGNOSIS — R7989 Other specified abnormal findings of blood chemistry: Secondary | ICD-10-CM

## 2018-03-28 DIAGNOSIS — E785 Hyperlipidemia, unspecified: Secondary | ICD-10-CM | POA: Diagnosis not present

## 2018-03-28 LAB — POCT GLYCOSYLATED HEMOGLOBIN (HGB A1C): HEMOGLOBIN A1C: 6.5 % — AB (ref 4.0–5.6)

## 2018-03-28 MED ORDER — METFORMIN HCL ER 500 MG PO TB24: ORAL_TABLET | ORAL | 3 refills | Status: DC

## 2018-03-28 MED ORDER — PRAVASTATIN SODIUM 40 MG PO TABS: 40.0000 mg | ORAL_TABLET | Freq: Every day | ORAL | 11 refills | Status: DC

## 2018-03-28 MED ORDER — GLIPIZIDE 5 MG PO TABS: ORAL_TABLET | ORAL | 3 refills | Status: DC

## 2018-03-28 NOTE — Progress Notes (Addendum)
Patient ID: [name], female   DOB: 1966/09/07, 52 y.o.   MRN: [id number]  HPI: [name] is a 52 y.o.-year-old female, returning for f/u for DM2, dx 2013, non-insulin-dependent, recently uncontrolled, without long-term complications. Last visit 4 months ago.   Last hemoglobin A1c was: Lab Results  Component Value Date   HGBA1C 7.5 11/27/2017   HGBA1C 6.6 07/12/2017   HGBA1C 7.7 04/11/2017   Pt is on a regimen of: - Metformin ER 500 mg 2x a day with meals- no SEs - Cycloset 2.4 mg daily in am - Glipizide 5 mg before lunch and 10 mg before supper (did not increase to 10 mg twice a day as advised). She did not start Welchol 2 tabs 2x a day - added 03/2017 - too expensive On Turmeric, alpha-lipoic acid, Garlicin.  She had various intolerances to different diabetes medications:  Metformin ER >> in the past, she had gastric discomfort and severe diarrhea (loss of bowel control).  However, we restarted this 08/2017 and she is tolerating this well.  Januvia >> tried for 1-2 months >> left chest pain  She was wondering if she could take Invokana, however she had multiple vaginal yeast infections. She Also had nocturia 3-4 x a night.   She had reactive hypoglycemia symptoms while on Quercetin (was taking it for allergies) >> we stopped this it is known to increase insulin production by the pancreas.  She is now checking sugars 0 to once a day: - am: 108-171, 295 (had 3 bags of sweets) >> 136, 149 - 1h after b'fast: 207 (after banana bread) >> n/c - before lunch:169-170s, 200s (icecream) >> 161, 200 - 2h after lunch: 187 >> n/c >> 172 >> n/c - bedtime: 142-223 >> 153, 239 >> n/c >> 116-198 - nighttime: 160-200s >> n/c >> 262 >>192, 217  She has many food intolerances: - Gluten >> cannot eat bread or pastry.  - raw vegetables, beans, cruciferous vegetables, fruit >> diarrhea.  - No dairy, however, she does eat ice cream - She does not digest fat well after her cholecystectomy  in 2011.   -No CKD, last BUN/creatinine was:  Lab Results  Component Value Date   BUN 16 03/13/2018   CREATININE 0.81 03/13/2018  On irbesartan.  - + HL - last lipid panel off statins: Lab Results  Component Value Date   CHOL 311 (H) 03/13/2018   HDL 106.40 03/13/2018   LDLCALC 166 (H) 03/13/2018   LDLDIRECT 205.0 01/24/2016   TRIG 196.0 (H) 03/13/2018   CHOLHDL 3 03/13/2018  Previous cholesterol levels were: 325/197/132/154.   She was previously on Questran for diarrhea but this was stopped as has diarrhea improved.  We tried Livalo but she could not tolerate this due to muscle pains.  At last visit I suggested fluvastatin XL, but she could not afford it.  - last dilated eye exam was in 03/2017: No Abigail([name])    - + numbness and tingling in her legs.  She is on R enantiomer of alpha lipoic acid.   She also has a history of PCOS - she has seen [name] at [facility] in the past - note from 08/26/2012: perimenopause, and at that time she was on Yasmin, subsequently c+hanged to [facility]. She sees [name] with OB/GYN. She has PTSD, MDD, insomnia, anxiety, asthma, HTN, GERD, status post cholecystectomy, anemia, menorrhagia, improved a little on new OCP, transaminitis. She also had increased uterine bleeding (on Necon). She continues seeing an  acupuncturist. She takes Nettle powder >> helped her with diarrhea, anemia, insomnia. 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 Also, Undecylenic acid and mastic gum, no carbs x 1-2 months.  She has IBS >> better on L- plantarum, S.bulardii - Brevibacillus Laterosporus 20 min before b'fast.   Last TSH was high Lab Results  Component Value Date   TSH 4.80 (H) 03/13/2018   TSH 2.91 01/24/2016   TSH 2.322 02/26/2013   TSH 2.050 08/07/2012   ROS: Constitutional: no weight gain/no weight loss, ++ fatigue, no subjective hyperthermia, no subjective hypothermia Eyes: no blurry vision, no  xerophthalmia ENT: no sore throat, no nodules palpated in throat, no dysphagia, no odynophagia, no hoarseness Cardiovascular: no CP/no SOB/no palpitations/no leg swelling Respiratory: no cough/no SOB/no wheezing Gastrointestinal: no N/no V/no D/no C/no acid reflux Musculoskeletal: no muscle aches/no joint aches Skin: no rashes, no hair loss Neurological: no tremors/+ numbness/+ tingling/no dizziness  I reviewed pt's medications, allergies, PMH, social hx, family hx, and changes were documented in the history of present illness. Otherwise, unchanged from my initial visit note.  Past Medical History:  Diagnosis Date  . Abnormal Papanicolaou smear of cervix with positive human papilloma virus (HPV) test 02/2017  . Anxiety   . Asthma   . Depression   . Diabetes mellitus type II   . Food intolerance   . Headache   . High blood pressure   . IBS (irritable bowel syndrome)   . Insomnia   . Migraines   . PCOS (polycystic ovarian syndrome)   . PTSD (post-traumatic stress disorder)   . Seasonal allergies   . Thyroid disease    Past Surgical History:  Procedure Laterality Date  . CHOLECYSTECTOMY    . GALLBLADDER SURGERY     Social History   Socioeconomic History  . Marital status: Single    Spouse name: Not on file  . Number of children: 0  . Years of education: 6218  . Highest education level: Not on file  Occupational History  . Occupation: Long Term Disability    Comment: PTSD  Social Needs  . Financial resource strain: Not on file  . Food insecurity:    Worry: Not on file    Inability: Not on file  . Transportation needs:    Medical: Not on file    Non-medical: Not on file  Tobacco Use  . Smoking status: Never Smoker  . Smokeless tobacco: Never Used  Substance and Sexual Activity  . Alcohol use: Yes    Alcohol/week: 0.3 oz    Types: 1 Standard drinks or equivalent per week    Comment: 1-2 drinks a month  . Drug use: No  . Sexual activity: Yes    Partners: Male     Birth control/protection: Patch    Comment: 1st intercourse- 16, partners- [id number], current partner- [id number]  Lifestyle  . Physical activity:    Days per week: Not on file    Minutes per session: Not on file  . Stress: Not on file  Relationships  . Social connections:    Talks on phone: Not on file    Gets together: Not on file    Attends religious service: Not on file    Active member of club or organization: Not on file    Attends meetings of clubs or organizations: Not on file    Relationship status: Not on file  . Intimate partner violence:    Fear of current or ex  partner: Not on file    Emotionally abused: Not on file    Physically abused: Not on file    Forced sexual activity: Not on file  Other Topics Concern  . Not on file  Social History Narrative   Regular exercise: yes, walk   Caffeine use: occasionally tea   Fun: Social group events, walking her dogs, movies   Denies abuse and feels safe where she lives.    Current Outpatient Medications on File Prior to Visit  Medication Sig Dispense Refill  . albuterol (PROVENTIL HFA;VENTOLIN HFA) 108 (90 Base) MCG/ACT inhaler 2 puffs every 4-6 hours as directed rescue inhaler 3 Inhaler 3  . Alpha-Lipoic Acid 600 MG CAPS Take 300 mg by mouth 2 (two) times daily.     Marland Kitchen atenolol (TENORMIN) 50 MG tablet Take 1 tablet (50 mg total) by mouth 2 (two) times daily. 180 tablet 1  . CYCLOSET 0.8 MG TABS TAKE 3 TABLETS BY MOUTH DAILY IN THE MORNING 270 tablet 0  . Digestive Enzymes (ENZYME DIGEST PO) Take by mouth.    Marland Kitchen glipiZIDE (GLUCOTROL) 5 MG tablet TAKE ONE TABLET BY MOUTH EVERY MORNING AND 2 TABLETS BEFORE DINNER 270 tablet 0  . glucose blood (ONE TOUCH ULTRA TEST) test strip USE once a day ad advised. 100 each 3  . irbesartan (AVAPRO) 75 MG tablet TAKE 1 TABLET(75 MG) BY MOUTH DAILY 90 tablet 0  . medroxyPROGESTERone (PROVERA) 10 MG tablet Take one tablet for 10 days of the moth if no spontaneous menses every 35 days 10 tablet 11  .  metFORMIN (GLUCOPHAGE-XR) 500 MG 24 hr tablet Take 2 tablets daily 180 tablet 3  . methocarbamol (ROBAXIN) 500 MG tablet Take 1 tablet (500 mg total) by mouth every 8 (eight) hours as needed for muscle spasms. 30 tablet 0  . naphazoline-pheniramine (VISINE-A) 0.025-0.3 % ophthalmic solution Place 1 drop into both eyes 2 (two) times daily.    . naproxen (NAPROSYN) 500 MG tablet Take 1 tablet (500 mg total) by mouth 2 (two) times daily with a meal. 30 tablet 1  . norelgestromin-ethinyl estradiol [employer]) 150-35 MCG/24HR transdermal patch Place 1 patch onto the skin once a week. Continuous use 12 patch 4  . OVER THE COUNTER MEDICATION 1 capsule daily. Probiotic renew life    . OVER THE COUNTER MEDICATION     . sertraline (ZOLOFT) 100 MG tablet TAKE 2 TABLETS BY MOUTH DAILY 180 tablet 0  . [DISCONTINUED] EFFEXOR XR 150 MG 24 hr capsule 375 mg daily.      No current facility-administered medications on file prior to visit.    Allergies  Allergen Reactions  . Latex Itching and Swelling  . Fluticasone     Nose bleeds   Family History  Problem Relation Age of Onset  . Bipolar disorder Mother   . Hypertension Mother   . Diabetes Mother   . Alzheimer's disease Father   . Hypertension Father   . Alzheimer's disease Maternal Grandmother   . Heart disease Paternal Grandfather     PE: BP 137/70   Pulse (!) 58   Ht 5\' 1"  (1.549 m)   Wt 160 lb 9.6 oz (72.8 kg)   SpO2 97%   BMI 30.35 kg/m  Body mass index is 30.35 kg/m.  Wt Readings from Last 3 Encounters:  03/28/18 160 lb 9.6 oz (72.8 kg)  03/13/18 165 lb 12.8 oz (75.2 kg)  02/25/18 164 lb (74.4 kg)   Constitutional: overweight, in NAD Eyes: PERRLA, EOMI,  no exophthalmos ENT: moist mucous membranes, no thyromegaly, no cervical lymphadenopathy Cardiovascular: RRR, No MRG Respiratory: CTA B Gastrointestinal: abdomen soft, NT, ND, BS+ Musculoskeletal: no deformities, strength intact in all 4 Skin: moist, warm, no rashes Neurological:  no tremor with outstretched hands, DTR normal in all 4  ASSESSMENT: 1. DM2, non-insulin-dependent, fairly well controlled, without long term complications, but with hyperglycemia  2. HL - Very high LDL - high HDL - slightly high TG  3.  Obesity class 1  4. Elevated TSH  Russell:  1. DM2 - Patient with fairly well-controlled diabetes, but hindered by binge eating when stressed.  She did see nutrition in the past.  She also has many medications intolerance, which limits her capacity to treat her diabetes.  Several months ago we started metformin extended release and she is tolerating this well after treatment of her GI fungal infection and improvement in her IBS. - At last visit, HbA1c was higher, at 7.5%, as she was not checking sugars and not taking metformin consistently, only taking it when she had a large meal.  We discussed about the importance of taking it twice a day, regardless of the size of the meal. - At this visit, per download of her meter, sugars are higher, so we discussed about increasing metformin to 4 tablets a day if tolerated and also increase glipizide in the morning for 5 mg to 10 mg - I advised her to:  Patient Instructions  Please increase: - Metformin ER 500 mg to 4x a day with meals - Glipizide 10 mg before lunch and 10 mg before supper  Please continue: - Cycloset 2.4 mg daily in am  Please come back for las next week, after being off Biotin for at least 5 days.  Try to start Pravastatin 40 mg daily.  Please return in 4 months with your sugar log.   - today, HbA1c is 6.5% (improved) - continue checking sugars at different times of the day - check 1x a day, rotating checks - advised for yearly eye exams >> she is UTD, but will need one soon - Return to clinic in 4 mo with sugar log   2. HL - Reviewed latest lipid panel from 02/2018: LDL higher than last year, at 166.  Last year, LDL was 150 on Livalo, but she was not taking it consistently. - However,  she had muscle aches weekly follow so at last visit I suggested fluvastatin XL.  She could not get this because of price.  Last set of lipids were off statins. - I suggested pravastatin 40, which she agrees to try  3.  Obesity class 1 - lost 4 lbs since last visit  4. Elevated TSH - repeat TFTs with thyroid antibodies, but I advised her to come back next week after being off biotin for 5 days  Component     Latest Ref Rng & Units 04/04/2018  TSH     0.35 - 4.50 uIU/mL 10.64 (H)  Thyroglobulin Ab     < or = 1 IU/mL <1  Thyroperoxidase Ab SerPl-aCnc     <9 IU/mL 2  Triiodothyronine,Free,Serum     2.3 - 4.2 pg/mL 2.6  T4,Free(Direct)     0.60 - 1.60 ng/dL 1.61   TSH >09 >> would suggest to start LT4 50 mcg daily and come back for labs in 1.5 mo.  Abigail Pavlov, MD PhD [facility]

## 2018-03-28 NOTE — Patient Instructions (Addendum)
Please increase: - Metformin ER 500 mg to 4x a day with meals - Glipizide 10 mg before lunch and 10 mg before supper  Please continue: - Cycloset 2.4 mg daily in am  Please come back for las next week, after being off Biotin for at least 5 days.  Try to start Pravastatin 40 mg daily.  Please return in 4 months with your sugar log.

## 2018-03-28 NOTE — Addendum Note (Signed)
Addended by: Yolande JollyLAWSON, Darvin Dials on: 03/28/2018 03:56 PM   Modules accepted: Orders

## 2018-04-04 ENCOUNTER — Other Ambulatory Visit (INDEPENDENT_AMBULATORY_CARE_PROVIDER_SITE_OTHER): Payer: PPO

## 2018-04-04 DIAGNOSIS — R7989 Other specified abnormal findings of blood chemistry: Secondary | ICD-10-CM

## 2018-04-04 LAB — TSH: TSH: 10.64 u[IU]/mL — ABNORMAL HIGH (ref 0.35–4.50)

## 2018-04-04 LAB — T3, FREE: T3, Free: 2.6 pg/mL (ref 2.3–4.2)

## 2018-04-04 LAB — T4, FREE: FREE T4: 0.72 ng/dL (ref 0.60–1.60)

## 2018-04-07 LAB — THYROID PEROXIDASE ANTIBODY: THYROID PEROXIDASE ANTIBODY: 2 [IU]/mL (ref <9)

## 2018-04-07 LAB — THYROGLOBULIN ANTIBODY: Thyroglobulin Ab: <1 IU/mL (ref <=1)

## 2018-04-07 MED ORDER — LEVOTHYROXINE SODIUM 50 MCG PO TABS: 50.0000 ug | ORAL_TABLET | Freq: Every day | ORAL | 3 refills | Status: DC

## 2018-04-07 NOTE — Addendum Note (Signed)
Addended by: Carlus PavlovGHERGHE, Darius Lundberg on: 04/07/2018 03:52 PM   Modules accepted: Orders

## 2018-04-11 ENCOUNTER — Other Ambulatory Visit: Payer: Self-pay | Admitting: Nurse Practitioner

## 2018-04-11 DIAGNOSIS — Z1231 Encounter for screening mammogram for malignant neoplasm of breast: Secondary | ICD-10-CM

## 2018-04-12 ENCOUNTER — Other Ambulatory Visit: Payer: Self-pay | Admitting: Internal Medicine

## 2018-05-28 ENCOUNTER — Encounter: Payer: Self-pay | Admitting: Nurse Practitioner

## 2018-05-28 LAB — HM DIABETES EYE EXAM

## 2018-06-02 ENCOUNTER — Other Ambulatory Visit: Payer: Self-pay | Admitting: Internal Medicine

## 2018-06-06 ENCOUNTER — Other Ambulatory Visit: Payer: Self-pay | Admitting: *Deleted

## 2018-06-06 DIAGNOSIS — I10 Essential (primary) hypertension: Secondary | ICD-10-CM

## 2018-06-06 MED ORDER — IRBESARTAN 75 MG PO TABS: ORAL_TABLET | ORAL | 1 refills | Status: DC

## 2018-06-23 ENCOUNTER — Other Ambulatory Visit: Payer: Self-pay | Admitting: Internal Medicine

## 2018-07-16 ENCOUNTER — Other Ambulatory Visit: Payer: Self-pay | Admitting: Internal Medicine

## 2018-07-17 ENCOUNTER — Other Ambulatory Visit: Payer: Self-pay | Admitting: Internal Medicine

## 2018-07-29 ENCOUNTER — Other Ambulatory Visit: Payer: Self-pay | Admitting: Internal Medicine

## 2018-07-30 ENCOUNTER — Ambulatory Visit (INDEPENDENT_AMBULATORY_CARE_PROVIDER_SITE_OTHER): Payer: PPO | Admitting: Internal Medicine

## 2018-07-30 ENCOUNTER — Encounter: Payer: Self-pay | Admitting: Internal Medicine

## 2018-07-30 VITALS — BP 118/70 | HR 67 | Ht 61.0 in | Wt 158.0 lb

## 2018-07-30 DIAGNOSIS — E785 Hyperlipidemia, unspecified: Secondary | ICD-10-CM

## 2018-07-30 DIAGNOSIS — Z8742 Personal history of other diseases of the female genital tract: Secondary | ICD-10-CM | POA: Diagnosis not present

## 2018-07-30 DIAGNOSIS — Z23 Encounter for immunization: Secondary | ICD-10-CM | POA: Diagnosis not present

## 2018-07-30 DIAGNOSIS — N926 Irregular menstruation, unspecified: Secondary | ICD-10-CM

## 2018-07-30 DIAGNOSIS — E1165 Type 2 diabetes mellitus with hyperglycemia: Secondary | ICD-10-CM

## 2018-07-30 DIAGNOSIS — E039 Hypothyroidism, unspecified: Secondary | ICD-10-CM

## 2018-07-30 LAB — POCT GLYCOSYLATED HEMOGLOBIN (HGB A1C): HEMOGLOBIN A1C: 6.6 % — AB (ref 4.0–5.6)

## 2018-07-30 LAB — TSH: TSH: 1.26 u[IU]/mL (ref 0.35–4.50)

## 2018-07-30 LAB — T4, FREE: FREE T4: 0.84 ng/dL (ref 0.60–1.60)

## 2018-07-30 NOTE — Patient Instructions (Signed)
Please continue: - Metformin ER 500 mg to 2x a day with meals - Glipizide 10 mg before lunch and 10 mg before supper - Cycloset 2.4 mg daily in am  Please return in 4 months with your sugar log.

## 2018-07-30 NOTE — Progress Notes (Signed)
Patient ID: [name], female   DOB: 1966/01/02, 52 y.o.   MRN: [id number]  HPI: [name] is a 52 y.o.-year-old female, returning for f/u for DM2, dx 2013, non-insulin-dependent, uncontrolled, without long-term complications and also hypothyroidism, recently diagnosed. Last visit 4 months ago.  DM2: Last hemoglobin A1c was: Lab Results  Component Value Date   HGBA1C 6.5 (A) 03/28/2018   HGBA1C 7.5 11/27/2017   HGBA1C 6.6 07/12/2017   Pt is on a regimen of: - Metformin ER 500 mg to 1-2x a day with meals (not taking this 4 times a day as suggested at last visit) - Glipizide 10 mg before lunch and 10 mg before supper - Cycloset 2.4 mg daily in am She did not start Welchol 2 tabs 2x a day - added 03/2017 - too expensive On Turmeric, alpha-lipoic acid + 660 mcg biotin per day, Garlicin.  She had various intolerances to different diabetes medications:  Metformin ER >> in the past, she had gastric discomfort and severe diarrhea (loss of bowel control).  However, we restarted this 08/2017 and she is tolerating this well.  Januvia >> tried for 1-2 months >> left chest pain  She was wondering if she could take Invokana, however she had multiple vaginal yeast infections. She Also had nocturia 3-4 x a night.   She had reactive hypoglycemia symptoms while on Quercetin (was taking it for allergies) >> we stopped this it is known to increase insulin production by the pancreas.  She is not checking sugars. From last visit: - am: 108-171, 295 (had 3 bags of sweets) >> 136, 149  - 1h after b'fast: 207 (after banana bread) >> n/c - before lunch:169-170s, 200s (icecream) >> 161, 200 - 2h after lunch: 187 >> n/c >> 172 >> n/c - bedtime: 142-223 >> 153, 239 >> n/c >> 116-198 - nighttime: 160-200s >> n/c >> 262 >>192, 217  She has many food intolerances: - Gluten >> cannot eat bread or pastry.  - raw vegetables, beans, cruciferous vegetables, fruit >> diarrhea.  - No dairy, however, she  does eat ice cream - She does not digest fat well after her cholecystectomy in 2011.   -No CKD, last BUN/creatinine was:  Lab Results  Component Value Date   BUN 16 03/13/2018   CREATININE 0.81 03/13/2018  On irbesartan.  -+ HL- last lipid panel off statins: Lab Results  Component Value Date   CHOL 311 (H) 03/13/2018   HDL 106.40 03/13/2018   LDLCALC 166 (H) 03/13/2018   LDLDIRECT 205.0 01/24/2016   TRIG 196.0 (H) 03/13/2018   CHOLHDL 3 03/13/2018  Previous cholesterol levels were: 325/197/132/154.   She was previously on Questran for diarrhea but this was stopped as has diarrhea improved.  We tried Livalo but she could not tolerate this due to muscle pain.  We also tried fluvastatin XL but this was not covered by her insurance.  At last visit I suggested pravastatin 40.  She is not on this b/c leg muscle aches.  - last dilated eye exam was in 05/2018: No DR (Dr Randon Goldsmith)    - + numbness and tingling in her legs.  She is on R enantiomer of alpha lipoic acid.   She also has a history of PCOS- she has seen Dr. Rosine Door at [facility] in the past - note from 08/26/2012: perimenopause, and at that time she was on Yasmin, subsequently c+hanged to [facility]. She sees Dr. Marvel Plan with OB/GYN. She has PTSD, MDD, insomnia, anxiety, asthma,  HTN, GERD, status post cholecystectomy, anemia, menorrhagia, improved a little on new OCP, transaminitis. She also had increased uterine bleeding (on Necon). She continues seeing an Ship brokeracupuncturist. She takes Nettle powder >> helped her with diarrhea, anemia, insomnia. 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 Also, Undecylenic acid and mastic gum, no carbs x 1-2 months.  She has IBS >> better on L- plantarum, S.bulardii - Brevibacillus Laterosporus 20 min before b'fast.   She has newly diagnosed hypothyroidism with negative antithyroid antibodies: Lab Results  Component Value Date   TSH 10.64 (H) 04/04/2018   TSH  4.80 (H) 03/13/2018   TSH 2.91 01/24/2016   TSH 2.322 02/26/2013   TSH 2.050 08/07/2012   FREET4 0.72 04/04/2018   T3FREE 2.6 04/04/2018   Component     Latest Ref Rng & Units 04/04/2018  Thyroglobulin Ab     < or = 1 IU/mL <1  Thyroperoxidase Ab SerPl-aCnc     <9 IU/mL 2   At last visit, we started levothyroxine low dose.  Pt is on levothyroxine 50 mcg daily, taken: - in am - fasting - at least 30 min from b'fast - no Ca, Fe, MVI, PPIs - not on Biotin  No family history of thyroid disease.  No FH of thyroid cancer. No h/o radiation tx to head or neck.  No seaweed or kelp. No recent contrast studies. No herbal supplements. No Biotin use. No recent steroids use.   Pt denies: - feeling nodules in neck - hoarseness - dysphagia - choking - SOB with lying down  She does feel better after she started levothyroxine.  ROS: Constitutional: no weight gain/+ weight loss, + less fatigue, no subjective hyperthermia, no subjective hypothermia Eyes: no blurry vision, no xerophthalmia ENT: no sore throat, + see HPI Cardiovascular: no CP/no SOB/no palpitations/no leg swelling Respiratory: no cough/no SOB/no wheezing Gastrointestinal: no N/no V/no D/no C/no acid reflux Musculoskeletal: no muscle aches/no joint aches Skin: no rashes, no hair loss Neurological: no tremors/+ numbness/+ tingling/no dizziness  I reviewed pt's medications, allergies, PMH, social hx, family hx, and changes were documented in the history of present illness. Otherwise, unchanged from my initial visit note.  Past Medical History:  Diagnosis Date  . Abnormal Papanicolaou smear of cervix with positive human papilloma virus (HPV) test 02/2017  . Anxiety   . Asthma   . Depression   . Diabetes mellitus type II   . Food intolerance   . Headache   . High blood pressure   . IBS (irritable bowel syndrome)   . Insomnia   . Migraines   . PCOS (polycystic ovarian syndrome)   . PTSD (post-traumatic stress  disorder)   . Seasonal allergies   . Thyroid disease    Past Surgical History:  Procedure Laterality Date  . CHOLECYSTECTOMY    . GALLBLADDER SURGERY     Social History   Socioeconomic History  . Marital status: Single    Spouse name: Not on file  . Number of children: 0  . Years of education: 3918  . Highest education level: Not on file  Occupational History  . Occupation: Long Term Disability    Comment: PTSD  Social Needs  . Financial resource strain: Not on file  . Food insecurity:    Worry: Not on file    Inability: Not on file  . Transportation needs:    Medical: Not on file    Non-medical: Not on file  Tobacco Use  .  Smoking status: Never Smoker  . Smokeless tobacco: Never Used  Substance and Sexual Activity  . Alcohol use: Yes    Alcohol/week: 0.5 standard drinks    Types: 1 Standard drinks or equivalent per week    Comment: 1-2 drinks a month  . Drug use: No  . Sexual activity: Yes    Partners: Male    Birth control/protection: Patch    Comment: 1st intercourse- 16, partners- 110, current partner- 52yrs  Lifestyle  . Physical activity:    Days per week: Not on file    Minutes per session: Not on file  . Stress: Not on file  Relationships  . Social connections:    Talks on phone: Not on file    Gets together: Not on file    Attends religious service: Not on file    Active member of club or organization: Not on file    Attends meetings of clubs or organizations: Not on file    Relationship status: Not on file  . Intimate partner violence:    Fear of current or ex partner: Not on file    Emotionally abused: Not on file    Physically abused: Not on file    Forced sexual activity: Not on file  Other Topics Concern  . Not on file  Social History Narrative   Regular exercise: yes, walk   Caffeine use: occasionally tea   Fun: Social group events, walking her dogs, movies   Denies abuse and feels safe where she lives.    Current Outpatient Medications on  File Prior to Visit  Medication Sig Dispense Refill  . albuterol (PROAIR HFA) 108 (90 Base) MCG/ACT inhaler INHALE 2 PUFFS BY MOUTH EVERY 4 TO 6 HOURS AS DIRECTED(RESCUE) 25.5 g 0  . Alpha-Lipoic Acid 600 MG CAPS Take 300 mg by mouth 2 (two) times daily.     Marland Kitchen atenolol (TENORMIN) 50 MG tablet TAKE 1 TABLET BY MOUTH TWO TIMES A DAY 120 tablet 0  . CYCLOSET 0.8 MG TABS TAKE 3 TABLETS BY MOUTH DAILY IN THE MORNING 270 tablet 0  . Digestive Enzymes (ENZYME DIGEST PO) Take by mouth.    Marland Kitchen glipiZIDE (GLUCOTROL) 5 MG tablet TAKE 2 TABLETs BY MOUTH EVERY MORNING AND 2 TABLETS BEFORE DINNER 360 tablet 3  . glipiZIDE (GLUCOTROL) 5 MG tablet TAKE 1 TABLET BY MOUTH EVERY MORNING AND 2 TABLETS BEFORE DINNER 270 tablet 0  . glucose blood (ONE TOUCH ULTRA TEST) test strip USE ONCE A DAY AS DIRECTED 100 each 2  . irbesartan (AVAPRO) 75 MG tablet TAKE 1 TABLET(75 MG) BY MOUTH DAILY 90 tablet 1  . levothyroxine (SYNTHROID, LEVOTHROID) 50 MCG tablet Take 1 tablet (50 mcg total) by mouth daily. 45 tablet 3  . medroxyPROGESTERone (PROVERA) 10 MG tablet Take one tablet for 10 days of the moth if no spontaneous menses every 35 days 10 tablet 11  . metFORMIN (GLUCOPHAGE-XR) 500 MG 24 hr tablet Take 2 tablets 2x a day 360 tablet 3  . methocarbamol (ROBAXIN) 500 MG tablet Take 1 tablet (500 mg total) by mouth every 8 (eight) hours as needed for muscle spasms. 30 tablet 0  . naphazoline-pheniramine (VISINE-A) 0.025-0.3 % ophthalmic solution Place 1 drop into both eyes 2 (two) times daily.    . naproxen (NAPROSYN) 500 MG tablet Take 1 tablet (500 mg total) by mouth 2 (two) times daily with a meal. 30 tablet 1  . norelgestromin-ethinyl estradiol [facility] [employer]) 150-35 MCG/24HR transdermal patch Place 1 patch onto the  skin once a week. Continuous use 12 patch 4  . OVER THE COUNTER MEDICATION 1 capsule daily. Probiotic renew life    . OVER THE COUNTER MEDICATION     . pravastatin (PRAVACHOL) 40 MG tablet Take 1 tablet (40 mg total) by  mouth daily. 30 tablet 11  . sertraline (ZOLOFT) 100 MG tablet TAKE 2 TABLETS BY MOUTH DAILY 180 tablet 0  . [DISCONTINUED] EFFEXOR XR 150 MG 24 hr capsule 375 mg daily.      No current facility-administered medications on file prior to visit.    Allergies  Allergen Reactions  . Latex Itching and Swelling  . Fluticasone     Nose bleeds   Family History  Problem Relation Age of Onset  . Bipolar disorder Mother   . Hypertension Mother   . Diabetes Mother   . Alzheimer's disease Father   . Hypertension Father   . Alzheimer's disease Maternal Grandmother   . Heart disease Paternal Grandfather     PE: BP 118/70   Pulse 67   Ht 5\' 1"  (1.549 m) Comment: measured  Wt 158 lb (71.7 kg)   SpO2 97%   BMI 29.85 kg/m  Body mass index is 29.85 kg/m.  Wt Readings from Last 3 Encounters:  07/30/18 158 lb (71.7 kg)  03/28/18 160 lb 9.6 oz (72.8 kg)  03/13/18 165 lb 12.8 oz (75.2 kg)   Constitutional: overweight, in NAD Eyes: PERRLA, EOMI, no exophthalmos ENT: moist mucous membranes, no thyromegaly, no cervical lymphadenopathy Cardiovascular: RRR, No MRG Respiratory: CTA B Gastrointestinal: abdomen soft, NT, ND, BS+ Musculoskeletal: no deformities, strength intact in all 4 Skin: moist, warm, no rashes Neurological: no tremor with outstretched hands, DTR normal in all 4  ASSESSMENT: 1. DM2, non-insulin-dependent, fairly well controlled, without long term complications, but with hyperglycemia  2. HL - Very high LDL - high HDL - slightly high TG  3.  Obesity class 1  4.  Hypothyroidism  5.  Irregular menses/PCOS  PLAN:  1. DM2 - Patient with fairly well-controlled diabetes, but hindered by binge eating when stressed.  She is on nutrition in the past.  She also has many medication intolerances, which limits our capacity to treat her diabetes.  She is not tolerating metformin ER splitting 4 doses a day.  At last visit, HbA1c has improved to 6.5%, from 7.5%.  At that time, we  increased metformin and glipizide.  We continued Cycloset. - Since last visit, she stopped checking her blood sugars.  I strongly advised her to restart. - today, HbA1c is 6.6% (only slightly higher) - For now, we will continue the current regimen including metformin only twice a day since she has problems with tolerating higher doses - I advised her to:  Patient Instructions  Please continue: - Metformin ER 500 mg to 2x a day with meals - Glipizide 10 mg before lunch and 10 mg before supper - Cycloset 2.4 mg daily in am  Please return in 4 months with your sugar log.   - continue checking sugars at different times of the day - check 1x a day, rotating checks - advised for yearly eye exams >> she is UTD - + flu shot today - Return to clinic in 4 mo with sugar log    2. HL - Reviewed latest lipid panel from 02/2018: LDL improved but still very high, triglycerides high Lab Results  Component Value Date   CHOL 311 (H) 03/13/2018   HDL 106.40 03/13/2018   LDLCALC 166 (  H) 03/13/2018   LDLDIRECT 205.0 01/24/2016   TRIG 196.0 (H) 03/13/2018   CHOLHDL 3 03/13/2018  -We tried Livalo but she was not taking this consistently and she was having muscle aches from it.  I then prescribed fluvastatin XL but insurance did not cover this.  At last visit, we started pravastatin 40.  She tells me that she did try this but this gave her leg pain, also.  We discussed about a referral to lipid clinic for PCSK9 inhibitor.  For now, she started to lose weight and we discussed about the need to continue improving her diet and will recheck her lipids at next visit.  3.  Obesity class 1 -Lost 7 pounds since this summer  4.  Hypothyroidism -Thyroid antibodies were negative at last visit, however, TSH returned still high, >10 so we started levothyroxine - she continues on LT4 50 mcg daily - pt feels better after she started levothyroxine - we discussed about taking the thyroid hormone every day, with water,  >30 minutes before breakfast, separated by >4 hours from acid reflux medications, calcium, iron, multivitamins. Pt. is taking it correctly. - will check thyroid tests today: TSH and fT4 - If labs are abnormal, she will need to return for repeat TFTs in 1.5 months  5.  Irregular menses/PCOS -Now approaching menopause -No intervention needed  Office Visit on 07/30/2018  Component Date Value Ref Range Status  . TSH 07/30/2018 1.26  0.35 - 4.50 uIU/mL Final  . Free T4 07/30/2018 0.84  0.60 - 1.60 ng/dL Final   Comment: Specimens from patients who are undergoing biotin therapy and /or ingesting biotin supplements may contain high levels of biotin.  The higher biotin concentration in these specimens interferes with this Free T4 assay.  Specimens that contain high levels  of biotin may cause false high results for this Free T4 assay.  Please interpret results in light of the total clinical presentation of the patient.    . Hemoglobin A1C 07/30/2018 6.6* 4.0 - 5.6 % Final   TFTs now excellent.  Carlus Pavlov, MD PhD [facility]

## 2018-07-31 ENCOUNTER — Telehealth: Payer: Self-pay | Admitting: Nurse Practitioner

## 2018-07-31 MED ORDER — ATENOLOL 50 MG PO TABS: 50.0000 mg | ORAL_TABLET | Freq: Two times a day (BID) | ORAL | 0 refills | Status: DC

## 2018-07-31 NOTE — Telephone Encounter (Signed)
Resent rx to requested pharmacy,,/lmb

## 2018-07-31 NOTE — Telephone Encounter (Signed)
Copied from CRM [id number]. Topic: Quick Communication - See Telephone Encounter >> Jul 31, 2018 12:09 PM Lorrine KinMcGee, Mical Brun B, NT wrote: CRM for notification. See Telephone encounter for: 07/31/18. Patient calling and states that her pharmacy will not refill the atenolol (TENORMIN) 50 MG tablet  that was sent on 07/29/18 until 08/04/18. States that she would [facility] that medication [facility] [place] - [address] Please advise.

## 2018-08-07 ENCOUNTER — Other Ambulatory Visit: Payer: Self-pay | Admitting: Obstetrics & Gynecology

## 2018-08-07 MED ORDER — CLOBETASOL PROPIONATE 0.05 % EX CREA: 1.0000 "application " | TOPICAL_CREAM | Freq: Two times a day (BID) | CUTANEOUS | 1 refills | Status: DC

## 2018-08-16 ENCOUNTER — Other Ambulatory Visit (HOSPITAL_COMMUNITY): Payer: Self-pay | Admitting: Psychiatry

## 2018-08-18 ENCOUNTER — Other Ambulatory Visit: Payer: Self-pay

## 2018-08-18 ENCOUNTER — Telehealth: Payer: Self-pay | Admitting: Internal Medicine

## 2018-08-18 MED ORDER — LEVOTHYROXINE SODIUM 50 MCG PO TABS: 50.0000 ug | ORAL_TABLET | Freq: Every day | ORAL | 2 refills | Status: DC

## 2018-08-18 NOTE — Telephone Encounter (Signed)
This has been sent

## 2018-08-18 NOTE — Telephone Encounter (Signed)
°  Patient called on 08/18/18 needing a [facility] [address]    MEDICATION: Thyroxine  [facility]AC[facility] [address]   LAST APPOINTMENT DATE: @11 /13/2019  NEXT APPOINTMENT DATE:@4 /09/2018

## 2018-08-24 ENCOUNTER — Other Ambulatory Visit: Payer: Self-pay | Admitting: Internal Medicine

## 2018-09-17 ENCOUNTER — Other Ambulatory Visit: Payer: Self-pay | Admitting: Internal Medicine

## 2018-09-23 ENCOUNTER — Other Ambulatory Visit: Payer: Self-pay | Admitting: Internal Medicine

## 2018-09-24 ENCOUNTER — Telehealth: Payer: Self-pay | Admitting: Nurse Practitioner

## 2018-09-24 ENCOUNTER — Ambulatory Visit (INDEPENDENT_AMBULATORY_CARE_PROVIDER_SITE_OTHER): Payer: PPO | Admitting: Psychology

## 2018-09-24 DIAGNOSIS — F4310 Post-traumatic stress disorder, unspecified: Secondary | ICD-10-CM | POA: Diagnosis not present

## 2018-09-24 DIAGNOSIS — Z569 Unspecified problems related to employment: Secondary | ICD-10-CM | POA: Diagnosis not present

## 2018-09-24 NOTE — Telephone Encounter (Signed)
Ok to Refill Atenolol 50 mg 2 bid? It was in her previous med history, but I can't tell where or why is changed to 1 bid.  Your last note states Atenolol 50 mg 1 bid.

## 2018-09-24 NOTE — Telephone Encounter (Signed)
Copied from CRM [id number]. Topic: Quick Communication - Rx Refill/Question >> Sep 24, 2018  1:35 PM Crist Infante wrote: Medication: atenolol (TENORMIN) 50 MG tablet   Pt states she has been taking this med 2 tabs, 2 times a day for 10 + yrs.. The Rx written 07/31/18 was for only 2 a day. Pt was wondering why she was running out of med so early, then noticed the directions. Pt needs new Rx with correct amount (4 a day) sent to:  [facility] [address] [place] - [address] [phone] (Phone) [phone] (Fax)

## 2018-09-28 ENCOUNTER — Other Ambulatory Visit: Payer: Self-pay | Admitting: Internal Medicine

## 2018-09-28 ENCOUNTER — Encounter: Payer: Self-pay | Admitting: Nurse Practitioner

## 2018-09-29 MED ORDER — ATENOLOL 50 MG PO TABS: 100.0000 mg | ORAL_TABLET | Freq: Two times a day (BID) | ORAL | 5 refills | Status: DC

## 2018-09-29 NOTE — Telephone Encounter (Signed)
See previous msg. New rx sent [facility].Marland KitchenRaechel Chute

## 2018-09-29 NOTE — Addendum Note (Signed)
Addended by: Deatra James on: 09/29/2018 11:17 AM   Modules accepted: Orders

## 2018-09-29 NOTE — Telephone Encounter (Signed)
This refill has been sent

## 2018-09-29 NOTE — Telephone Encounter (Signed)
Reviewed chart pt is supposed to be taking 2 atenolol twice a day. Updated rx and sent to [facility].Marland KitchenShearon Stalls

## 2018-09-30 ENCOUNTER — Other Ambulatory Visit: Payer: Self-pay | Admitting: *Deleted

## 2018-09-30 MED ORDER — ATENOLOL 50 MG PO TABS: 100.0000 mg | ORAL_TABLET | Freq: Two times a day (BID) | ORAL | 1 refills | Status: DC

## 2018-10-13 ENCOUNTER — Ambulatory Visit (INDEPENDENT_AMBULATORY_CARE_PROVIDER_SITE_OTHER): Payer: PPO | Admitting: Psychology

## 2018-10-13 DIAGNOSIS — F4310 Post-traumatic stress disorder, unspecified: Secondary | ICD-10-CM

## 2018-10-13 DIAGNOSIS — Z569 Unspecified problems related to employment: Secondary | ICD-10-CM | POA: Diagnosis not present

## 2018-10-27 ENCOUNTER — Ambulatory Visit: Payer: PPO | Admitting: Psychology

## 2018-11-13 ENCOUNTER — Other Ambulatory Visit: Payer: Self-pay | Admitting: Internal Medicine

## 2018-11-13 ENCOUNTER — Other Ambulatory Visit (HOSPITAL_COMMUNITY): Payer: Self-pay | Admitting: Psychiatry

## 2018-11-17 ENCOUNTER — Ambulatory Visit: Payer: PPO | Admitting: Psychology

## 2018-11-24 ENCOUNTER — Other Ambulatory Visit: Payer: Self-pay | Admitting: Internal Medicine

## 2018-12-09 ENCOUNTER — Ambulatory Visit (HOSPITAL_COMMUNITY): Payer: Self-pay | Admitting: Psychiatry

## 2018-12-17 ENCOUNTER — Ambulatory Visit: Payer: Self-pay | Admitting: Internal Medicine

## 2019-01-02 ENCOUNTER — Ambulatory Visit: Payer: PPO | Admitting: Psychology

## 2019-01-12 ENCOUNTER — Other Ambulatory Visit: Payer: Self-pay | Admitting: *Deleted

## 2019-01-12 DIAGNOSIS — I10 Essential (primary) hypertension: Secondary | ICD-10-CM

## 2019-01-12 MED ORDER — IRBESARTAN 75 MG PO TABS: 75.0000 mg | ORAL_TABLET | Freq: Every day | ORAL | 0 refills | Status: DC

## 2019-01-29 ENCOUNTER — Telehealth: Payer: Self-pay | Admitting: Internal Medicine

## 2019-01-29 NOTE — Telephone Encounter (Signed)
LM to call back to reschedule appt for Dr Gherghe - No Show/cancellation list.  

## 2019-02-11 ENCOUNTER — Other Ambulatory Visit: Payer: Self-pay | Admitting: Internal Medicine

## 2019-02-13 ENCOUNTER — Ambulatory Visit (INDEPENDENT_AMBULATORY_CARE_PROVIDER_SITE_OTHER): Payer: PPO | Admitting: Psychiatry

## 2019-02-13 ENCOUNTER — Encounter (HOSPITAL_COMMUNITY): Payer: Self-pay | Admitting: Psychiatry

## 2019-02-13 DIAGNOSIS — F411 Generalized anxiety disorder: Secondary | ICD-10-CM

## 2019-02-13 DIAGNOSIS — F3341 Major depressive disorder, recurrent, in partial remission: Secondary | ICD-10-CM

## 2019-02-13 DIAGNOSIS — F4310 Post-traumatic stress disorder, unspecified: Secondary | ICD-10-CM

## 2019-02-13 MED ORDER — CLONAZEPAM 0.5 MG PO TABS: 0.2500 mg | ORAL_TABLET | Freq: Every day | ORAL | 0 refills | Status: DC

## 2019-02-13 MED ORDER — SERTRALINE HCL 100 MG PO TABS: 200.0000 mg | ORAL_TABLET | Freq: Every day | ORAL | 0 refills | Status: DC
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
## 2019-02-27 ENCOUNTER — Encounter: Payer: PPO | Admitting: Obstetrics & Gynecology

## 2019-03-02 ENCOUNTER — Telehealth: Payer: Self-pay

## 2019-03-02 NOTE — Telephone Encounter (Signed)
Patient called today and stated she can not find her thyroid medication and has not taken it in a week so she needs some called in to [place] at [facility]

## 2019-03-06 ENCOUNTER — Other Ambulatory Visit: Payer: Self-pay | Admitting: Internal Medicine

## 2019-03-15 ENCOUNTER — Other Ambulatory Visit: Payer: Self-pay | Admitting: Internal Medicine

## 2019-03-18 ENCOUNTER — Encounter: Payer: Self-pay | Admitting: Nurse Practitioner

## 2019-03-31 ENCOUNTER — Encounter: Payer: Self-pay | Admitting: Internal Medicine

## 2019-03-31 ENCOUNTER — Ambulatory Visit (INDEPENDENT_AMBULATORY_CARE_PROVIDER_SITE_OTHER): Payer: PPO | Admitting: Internal Medicine

## 2019-03-31 ENCOUNTER — Encounter

## 2019-03-31 ENCOUNTER — Other Ambulatory Visit: Payer: Self-pay

## 2019-03-31 VITALS — BP 100/60 | HR 68 | Ht 61.0 in | Wt 157.0 lb

## 2019-03-31 DIAGNOSIS — E1165 Type 2 diabetes mellitus with hyperglycemia: Secondary | ICD-10-CM

## 2019-03-31 DIAGNOSIS — Z8742 Personal history of other diseases of the female genital tract: Secondary | ICD-10-CM

## 2019-03-31 DIAGNOSIS — E785 Hyperlipidemia, unspecified: Secondary | ICD-10-CM | POA: Diagnosis not present

## 2019-03-31 DIAGNOSIS — E039 Hypothyroidism, unspecified: Secondary | ICD-10-CM

## 2019-03-31 LAB — LIPID PANEL
Cholesterol: 405 mg/dL — ABNORMAL HIGH (ref 0–200)
HDL: 130.4 mg/dL (ref >39.00)
LDL Cholesterol: 237 mg/dL — ABNORMAL HIGH (ref 0–99)
NonHDL: 274.35
Total CHOL/HDL Ratio: 3
Triglycerides: 185 mg/dL — ABNORMAL HIGH (ref 0.0–149.0)
VLDL: 37 mg/dL (ref 0.0–40.0)

## 2019-03-31 LAB — POCT GLYCOSYLATED HEMOGLOBIN (HGB A1C): Hemoglobin A1C: 7.3 % — AB (ref 4.0–5.6)

## 2019-03-31 LAB — MICROALBUMIN / CREATININE URINE RATIO
Creatinine,U: 184.3 mg/dL
Microalb Creat Ratio: 8.6 mg/g (ref 0.0–30.0)
Microalb, Ur: 15.9 mg/dL — ABNORMAL HIGH (ref 0.0–1.9)

## 2019-03-31 MED ORDER — ONETOUCH ULTRA MINI W/DEVICE KIT: PACK | 0 refills | Status: DC

## 2019-03-31 NOTE — Patient Instructions (Addendum)
Please continue: - Glipizide 10 mg before lunch and 10 mg before supper - Cycloset 2.4 mg daily in am  Try to start: - Rybelsus 3 mg before b'fast  Please return in 3-4 months with your sugar log.

## 2019-03-31 NOTE — Progress Notes (Signed)
Patient ID: [name], female   DOB: 10-22-1965, 53 y.o.   MRN: [id number]  HPI: [name] is a 53 y.o.-year-old female, returning for f/u for DM2, dx 2013, non-insulin-dependent, uncontrolled, without long-term complications and also hypothyroidism. Last visit 8 months ago.  She just bought a house >> will move in 1 mo. This was a really stressful period for her - she did not sleep well and did not check her sugars.  Moreover, she had to stop metformin due to diarrhea.  DM2: Last hemoglobin A1c was: Lab Results  Component Value Date   HGBA1C 6.6 (A) 07/30/2018   HGBA1C 6.5 (A) 03/28/2018   HGBA1C 7.5 11/27/2017   Pt is on a regimen of: - stopped 2 mo ago b/c stool incontinence - Glipizide 10 mg before lunch and 10 mg before supper - Cycloset 2.4 mg daily in am She did not start Welchol 2 tabs 2x a day - added 03/2017 - too expensive On Turmeric, alpha-lipoic acid + 660 mcg biotin per day, Garlicin.  She had various intolerances to different diabetes medications:  Metformin ER >> in the past, she had gastric discomfort and severe diarrhea (loss of bowel control).  However, we restarted this 08/2017 and she is tolerating this well.  Januvia >> tried for 1-2 months >> left chest pain  She was wondering if she could take Invokana, however she had multiple vaginal yeast infections. She Also had nocturia 3-4 x a night.   She had reactive hypoglycemia symptoms while on Quercetin (was taking it for allergies) >> we stopped this it is known to increase insulin production by the pancreas.  At last visit he was not checking sugars.  Since then she checked 0 to once a day: - am: 108-171, 295 (had 3 bags of sweets) >> 136, 149  - 1h after b'fast: 207 (after banana bread) >> n/c - before lunch:169-170s, 200s (icecream) >> 161, 200 - 2h after lunch: 187 >> n/c >> 172 >> n/c - bedtime: 142-223 >> 153, 239 >> n/c >> 116-198 - nighttime: 160-200s >> n/c >> 262 >>192, 217  She has many  food intolerances, - Gluten >> cannot eat bread or pastry.  - raw vegetables, beans, cruciferous vegetables, fruit >> diarrhea.  - No dairy, however, she does eat ice cream - She does not digest fat well after her cholecystectomy in 2011.   Meter: One Touch Mini  -No CAD, last BUN/creatinine was:  Lab Results  Component Value Date   BUN 16 03/13/2018   CREATININE 0.81 03/13/2018  On irbesartan.  -+ HL-last lipid panel off statins: Lab Results  Component Value Date   CHOL 311 (H) 03/13/2018   HDL 106.40 03/13/2018   LDLCALC 166 (H) 03/13/2018   LDLDIRECT 205.0 01/24/2016   TRIG 196.0 (H) 03/13/2018   CHOLHDL 3 03/13/2018  Previous cholesterol levels were: 325/197/132/154.   She could not tolerate pravastatin on Livalo due to muscle aches.  - last dilated eye exam was in 05/2018: No DR (Dr Randon Goldsmith)    - + numbness and tingling in her legs.  She is on R enantiomer of alpha lipoic acid.   She also has a history of PCOS- she has seen Dr. Rosine [facility] at [facility] in the past - note from 08/26/2012: perimenopause, and at that time she was on Yasmin, subsequently c+hanged to [facility]. She sees Dr. Marvel Plan with OB/GYN. She has PTSD, MDD, insomnia, anxiety, asthma, HTN, GERD, status post cholecystectomy, anemia, transaminitis. She also had  increased uterine bleeding (on Necon). She continues seeing an Ship brokeracupuncturist. She takes Nettle powder >> helped her with diarrhea, anemia, insomnia. 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 Also, Undecylenic acid and mastic gum, no carbs x 1-2 months.  She has IBS >> better on L- plantarum, S.bulardii - Brevibacillus Laterosporus 20 min before b'fast.   Hypothyroidism  - dx 02/2018 - negative antithyroid antibodies  Reviewed previous TFTs: Lab Results  Component Value Date   TSH 1.26 07/30/2018   TSH 10.64 (H) 04/04/2018   TSH 4.80 (H) 03/13/2018   TSH 2.91 01/24/2016   TSH 2.322 02/26/2013   TSH  2.050 08/07/2012   FREET4 0.84 07/30/2018   FREET4 0.72 04/04/2018   T3FREE 2.6 04/04/2018   Component     Latest Ref Rng & Units 04/04/2018  Thyroglobulin Ab     < or = 1 IU/mL <1  Thyroperoxidase Ab SerPl-aCnc     <9 IU/mL 2   We started levothyroxine 03/2018.  Pt is on levothyroxine 50 mcg daily, taken: - lost this x 2 weeks >> restarted 1 week ago - in am - fasting - at least 30 min from b'fast - no Ca, Fe, MVI, PPIs - not on Biotin  No family history of thyroid disease.  No FH of thyroid cancer. No h/o radiation tx to head or neck.  No seaweed or kelp. No recent contrast studies. No herbal supplements. No Biotin use. No recent steroids use.   Pt denies: - feeling nodules in neck - hoarseness - dysphagia - choking - SOB with lying down  She felt better after starting levothyroxine.   She started a mineral compound - in am.  ROS: Constitutional: no weight gain/no weight loss, + fatigue, no subjective hyperthermia, no subjective hypothermia Eyes: no blurry vision, no xerophthalmia ENT: no sore throat, + see HPI Cardiovascular: no CP/no SOB/no palpitations/no leg swelling Respiratory: no cough/no SOB/no wheezing Gastrointestinal: no N/no V/no D/no C/no acid reflux Musculoskeletal: no muscle aches/no joint aches Skin: no rashes, no hair loss Neurological: no tremors/+ numbness/+ tingling/no dizziness  I reviewed pt's medications, allergies, PMH, social hx, family hx, and changes were documented in the history of present illness. Otherwise, unchanged from my initial visit note.  Past Medical History:  Diagnosis Date  . Abnormal Papanicolaou smear of cervix with positive human papilloma virus (HPV) test 02/2017  . Anxiety   . Asthma   . Depression   . Diabetes mellitus type II   . Food intolerance   . Headache   . High blood pressure   . IBS (irritable bowel syndrome)   . Insomnia   . Migraines   . PCOS (polycystic ovarian syndrome)   . PTSD  (post-traumatic stress disorder)   . Seasonal allergies   . Thyroid disease    Past Surgical History:  Procedure Laterality Date  . CHOLECYSTECTOMY    . GALLBLADDER SURGERY     Social History   Socioeconomic History  . Marital status: Single    Spouse name: Not on file  . Number of children: 0  . Years of education: 4418  . Highest education level: Not on file  Occupational History  . Occupation: Long Term Disability    Comment: PTSD  Social Needs  . Financial resource strain: Not on file  . Food insecurity    Worry: Not on file    Inability: Not on file  . Transportation needs    Medical: Not on file  Non-medical: Not on file  Tobacco Use  . Smoking status: Never Smoker  . Smokeless tobacco: Never Used  Substance and Sexual Activity  . Alcohol use: Yes    Alcohol/week: 0.5 standard drinks    Types: 1 Standard drinks or equivalent per week    Comment: 1-2 drinks a month  . Drug use: No  . Sexual activity: Yes    Partners: Male    Birth control/protection: Patch    Comment: 1st intercourse- 16, partners- [phone], current partner- 4458yrs  Lifestyle  . Physical activity    Days per week: Not on file    Minutes per session: Not on file  . Stress: Not on file  Relationships  . Social Musicianconnections    Talks on phone: Not on file    Gets together: Not on file    Attends religious service: Not on file    Active member of club or organization: Not on file    Attends meetings of clubs or organizations: Not on file    Relationship status: Not on file  . Intimate partner violence    Fear of current or ex partner: Not on file    Emotionally abused: Not on file    Physically abused: Not on file    Forced sexual activity: Not on file  Other Topics Concern  . Not on file  Social History Narrative   Regular exercise: yes, walk   Caffeine use: occasionally tea   Fun: Social group events, walking her dogs, movies   Denies abuse and feels safe where she lives.    Current  Outpatient Medications on File Prior to Visit  Medication Sig Dispense Refill  . albuterol (PROAIR HFA) 108 (90 Base) MCG/ACT inhaler INHALE 2 PUFFS BY MOUTH EVERY 4 TO 6 HOURS AS DIRECTED(RESCUE) 25.5 g 0  . Alpha-Lipoic Acid 600 MG CAPS Take 300 mg by mouth 2 (two) times daily.     Marland Kitchen. atenolol (TENORMIN) 50 MG tablet Take 2 tablets (100 mg total) by mouth 2 (two) times daily. 360 tablet 1  . clobetasol cream (TEMOVATE) 0.05 % Apply 1 application topically 2 (two) times daily. 30 g 1  . clonazePAM (KLONOPIN) 0.5 MG tablet Take 0.5 tablets (0.25 mg total) by mouth daily. 15 tablet 0  . CYCLOSET 0.8 MG TABS TAKE 3 TABLETS BY MOUTH DAILY IN THE MORNING 270 tablet 0  . Digestive Enzymes (ENZYME DIGEST PO) Take by mouth.    Marland Kitchen. glipiZIDE (GLUCOTROL) 5 MG tablet TAKE 2 TABLETs BY MOUTH EVERY MORNING AND 2 TABLETS BEFORE DINNER 360 tablet 3  . glipiZIDE (GLUCOTROL) 5 MG tablet TAKE ONE TABLET BY MOUTH EVERY MORNING AND 2 TABLETS BEFORE DINNER 270 tablet 0  . glucose blood (ONE TOUCH ULTRA TEST) test strip USE ONCE A DAY AS DIRECTED 100 each 2  . irbesartan (AVAPRO) 75 MG tablet Take 1 tablet (75 mg total) by mouth daily. Needs visit for future refills. 90 tablet 0  . levothyroxine (SYNTHROID) 50 MCG tablet TAKE 1 TABLET BY MOUTH DAILY 30 tablet 2  . medroxyPROGESTERone (PROVERA) 10 MG tablet Take one tablet for 10 days of the moth if no spontaneous menses every 35 days 10 tablet 11  . metFORMIN (GLUCOPHAGE-XR) 500 MG 24 hr tablet Take 2 tablets 2x a day 360 tablet 3  . methocarbamol (ROBAXIN) 500 MG tablet Take 1 tablet (500 mg total) by mouth every 8 (eight) hours as needed for muscle spasms. 30 tablet 0  . naphazoline-pheniramine (VISINE-A) 0.025-0.3 % ophthalmic  solution Place 1 drop into both eyes 2 (two) times daily.    . naproxen (NAPROSYN) 500 MG tablet Take 1 tablet (500 mg total) by mouth 2 (two) times daily with a meal. 30 tablet 1  . norelgestromin-ethinyl estradiol Marilu Favre) 150-35 MCG/24HR  transdermal patch Place 1 patch onto the skin once a week. Continuous use 12 patch 4  . OVER THE COUNTER MEDICATION 1 capsule daily. Probiotic renew life    . OVER THE COUNTER MEDICATION     . pravastatin (PRAVACHOL) 40 MG tablet Take 1 tablet (40 mg total) by mouth daily. 30 tablet 11  . sertraline (ZOLOFT) 100 MG tablet Take 2 tablets (200 mg total) by mouth daily. 180 tablet 0  . [DISCONTINUED] EFFEXOR XR 150 MG 24 hr capsule 375 mg daily.      No current facility-administered medications on file prior to visit.    Allergies  Allergen Reactions  . Latex Itching and Swelling  . Fluticasone     Nose bleeds   Family History  Problem Relation Age of Onset  . Bipolar disorder Mother   . Hypertension Mother   . Diabetes Mother   . Alzheimer's disease Father   . Hypertension Father   . Alzheimer's disease Maternal Grandmother   . Heart disease Paternal Grandfather     PE: BP 100/60   Pulse 68   Ht 5\' 1"  (1.549 m)   Wt 157 lb (71.2 kg)   SpO2 98%   BMI 29.66 kg/m  Body mass index is 29.66 kg/m.  Wt Readings from Last 3 Encounters:  03/31/19 157 lb (71.2 kg)  07/30/18 158 lb (71.7 kg)  03/28/18 160 lb 9.6 oz (72.8 kg)   Constitutional: overweight, in NAD Eyes: PERRLA, EOMI, no exophthalmos ENT: moist mucous membranes, no thyromegaly, no cervical lymphadenopathy Cardiovascular: RRR, No MRG Respiratory: CTA B Gastrointestinal: abdomen soft, NT, ND, BS+ Musculoskeletal: no deformities, strength intact in all 4 Skin: moist, warm, no rashes Neurological: no tremor with outstretched hands, DTR normal in all 4  ASSESSMENT: 1. DM2, non-insulin-dependent, fairly well controlled, without long term complications, but with hyperglycemia  2. HL - Very high LDL - high HDL - slightly high TG  3.  Obesity class 1  4.  Hypothyroidism  5.  Irregular menses/PCOS  PLAN:  1. DM2 - Patient with fairly well-controlled diabetes but hindered by binge eating when stressed.  She saw  nutrition in the past.  She also has many medication intolerances which limit our capacity to treat her diabetes.  She has not tolerated higher metformin ER doses -At last visit, HbA1c was slightly higher, at 6.6%.  At that time, she was not checking sugars and I strongly advised him to start. -At this visit, she is still not checking sugars and I again strongly advised her to do so.  Since she stopped metformin I suspect that her sugars are higher and decided to add Rybelsus.  We will start at a low dose and advance as tolerated.  She agrees to do so. - I advised her to:  Patient Instructions  Please continue: - Glipizide 10 mg before lunch and 10 mg before supper - Cycloset 2.4 mg daily in am  Try to start: - Rybelsus 3 mg before b'fast  Please return in 3-4 months with your sugar log.   - we checked her HbA1c: 7.3% (higher) - advised to check sugars at different times of the day - 1x a day, rotating check times - advised for yearly eye  exams >> she is UTD - return to clinic in 6 months    2. HL - Reviewed latest lipid panel from 02/2018, LDL improved but still very high Lab Results  Component Value Date   CHOL 311 (H) 03/13/2018   HDL 106.40 03/13/2018   LDLCALC 166 (H) 03/13/2018   LDLDIRECT 205.0 01/24/2016   TRIG 196.0 (H) 03/13/2018   CHOLHDL 3 03/13/2018  -She could not tolerate Livalo and pravastatin due to muscle aches.  Insurance did not cover fluvastatin XL.  She declined a referral to lipid clinic for PCSK9 inhibitor at last visit.  She wanted to work on her weight loss and improving her diet pending a repeat lipid panel at this visit -We will recheck this today (only had V8 with crackers.  3.  Obesity class 1 -At last visit she lost 7 pounds in 5 months -Weight did not change since last visit  4.  Hypothyroidism -Thyroid antibodies were not elevated - latest thyroid labs reviewed with pt >> normal  - she continues on LT4 50 mcg daily - pt feels good on this  dose. - we discussed about taking the thyroid hormone every day, with water, >30 minutes before breakfast, separated by >4 hours from acid reflux medications, calcium, iron, multivitamins. Pt. is taking it correctly. - will check thyroid tests today: TSH and fT4 - If labs are abnormal, she will need to return for repeat TFTs in 1.5 months  5.  Irregular menses/PCOS -Now perimenopausal -No intervention needed  Component     Latest Ref Rng & Units 03/31/2019  Glucose     65 - 99 mg/dL 161 (H)  BUN     7 - 25 mg/dL 16  Creatinine     0.96 - 1.05 mg/dL 0.45  GFR, Est Non African American     > OR = 60 mL/min/1.60m2 84  GFR, Est African American     > OR = 60 mL/min/1.25m2 97  BUN/Creatinine Ratio     6 - 22 (calc) NOT APPLICABLE  Sodium     135 - 146 mmol/L 135  Potassium     3.5 - 5.3 mmol/L 5.1  Chloride     98 - 110 mmol/L 99  CO2     20 - 32 mmol/L 25  Calcium     8.6 - 10.4 mg/dL 40.9  Total Protein     6.1 - 8.1 g/dL 7.7  Albumin MSPROF     3.6 - 5.1 g/dL 4.1  Globulin     1.9 - 3.7 g/dL (calc) 3.6  AG Ratio     1.0 - 2.5 (calc) 1.1  Total Bilirubin     0.2 - 1.2 mg/dL 0.4  Alkaline phosphatase (APISO)     37 - 153 U/L 74  AST     10 - 35 U/L 20  ALT     6 - 29 U/L 17  Cholesterol     0 - 200 mg/dL 811 (H)  Triglycerides     0.0 - 149.0 mg/dL 914.7 (H)  HDL Cholesterol     >39.00 mg/dL 829.56  VLDL     0.0 - 40.0 mg/dL 21.3  LDL (calc)     0 - 99 mg/dL 086 (H)  Total CHOL/HDL Ratio      3  NonHDL      274.35  Microalb, Ur     0.0 - 1.9 mg/dL 57.8 (H)  Creatinine,U     mg/dL 469.6  MICROALB/CREAT RATIO  0.0 - 30.0 mg/g 8.6   High glucose, normal GFR, LFTs and ACR. VERY HIGH LDL and total cholesterol!  Slightly high triglycerides. I will refer her to the lipid clinic if she agrees. Unfortunately, TFTs not checked.  We will  try to add these to the tubes already in the lab.  Carlus Pavlovristina Shigeru Lampert, MD PhD [facility]

## 2019-04-01 ENCOUNTER — Other Ambulatory Visit (INDEPENDENT_AMBULATORY_CARE_PROVIDER_SITE_OTHER): Payer: PPO

## 2019-04-01 ENCOUNTER — Other Ambulatory Visit: Payer: Self-pay | Admitting: Internal Medicine

## 2019-04-01 DIAGNOSIS — E039 Hypothyroidism, unspecified: Secondary | ICD-10-CM

## 2019-04-01 LAB — COMPLETE METABOLIC PANEL WITH GFR
AG Ratio: 1.1 (calc) (ref 1.0–2.5)
ALT: 17 U/L (ref 6–29)
AST: 20 U/L (ref 10–35)
Albumin: 4.1 g/dL (ref 3.6–5.1)
Alkaline phosphatase (APISO): 74 U/L (ref 37–153)
BUN: 16 mg/dL (ref 7–25)
CO2: 25 mmol/L (ref 20–32)
Calcium: 10.2 mg/dL (ref 8.6–10.4)
Chloride: 99 mmol/L (ref 98–110)
Creat: 0.81 mg/dL (ref 0.50–1.05)
GFR, Est African American: 97 mL/min/{1.73_m2} (ref >=60)
GFR, Est Non African American: 84 mL/min/{1.73_m2} (ref >=60)
Globulin: 3.6 g/dL (calc) (ref 1.9–3.7)
Glucose, Bld: 188 mg/dL — ABNORMAL HIGH (ref 65–99)
Potassium: 5.1 mmol/L (ref 3.5–5.3)
Sodium: 135 mmol/L (ref 135–146)
Total Bilirubin: 0.4 mg/dL (ref 0.2–1.2)
Total Protein: 7.7 g/dL (ref 6.1–8.1)

## 2019-04-01 LAB — TSH: TSH: 2.96 u[IU]/mL (ref 0.35–4.50)

## 2019-04-01 LAB — T4, FREE: Free T4: 0.6 ng/dL (ref 0.60–1.60)

## 2019-04-09 ENCOUNTER — Telehealth: Payer: Self-pay | Admitting: Internal Medicine

## 2019-04-09 ENCOUNTER — Encounter: Payer: PPO | Admitting: Obstetrics & Gynecology

## 2019-04-09 NOTE — Telephone Encounter (Signed)
MEDICATION: Ryebelsus  PHARMACY:  [facility] on [address]  IS THIS A 90 DAY SUPPLY :   IS PATIENT OUT OF MEDICATION:   IF NOT; HOW MUCH IS LEFT:   LAST APPOINTMENT DATE: @7 /14/2020  NEXT APPOINTMENT DATE:@11 /10/2018  DO WE HAVE YOUR PERMISSION TO LEAVE A DETAILED MESSAGE:  OTHER COMMENTS: took the 10 days worth of sample, tolerated with small amount of nasuea (stated was tolerable) and would like this called into pharmacy   **Let patient know to contact pharmacy at the end of the day to make sure medication is ready. **  ** Please notify patient to allow 48-72 hours to process**  **Encourage patient to contact the pharmacy for refills or they can request refills through [facility]**

## 2019-04-09 NOTE — Telephone Encounter (Signed)
Please advise.  This is not covered and patient will need the coupon card and a printed RX to take to pharmacy with the coupon once she activates it.

## 2019-04-09 NOTE — Telephone Encounter (Signed)
Ok to give her the coupon and Rx

## 2019-04-10 ENCOUNTER — Other Ambulatory Visit: Payer: Self-pay | Admitting: Internal Medicine

## 2019-04-10 DIAGNOSIS — I10 Essential (primary) hypertension: Secondary | ICD-10-CM

## 2019-04-16 NOTE — Telephone Encounter (Signed)
Patient states that she was taking the lowest dosage.

## 2019-04-17 NOTE — Telephone Encounter (Signed)
Please refer to pt response re: dosage and provide Rx for pt to fill. Will advise about discount card if not covered by insurance.

## 2019-04-17 NOTE — Telephone Encounter (Signed)
OK to give her a Rx for Rybelsus 7 mg and take 1/2 tablet for the next week. If tolerated well, increase to 7 mg daily afterwards. The Rx can be printed with 7 mg daily just to avoid confusion at the pharmacy. Can give her #30 with 5 refills.

## 2019-04-20 ENCOUNTER — Other Ambulatory Visit: Payer: Self-pay

## 2019-04-20 DIAGNOSIS — E1165 Type 2 diabetes mellitus with hyperglycemia: Secondary | ICD-10-CM

## 2019-04-20 MED ORDER — RYBELSUS 7 MG PO TABS: 7.0000 mg | ORAL_TABLET | ORAL | 5 refills | Status: DC

## 2019-04-20 NOTE — Telephone Encounter (Signed)
Semaglutide (RYBELSUS) 7 MG TABS 30 tablet 5 04/20/2019    Sig - Route: Take 7 mg by mouth See admin instructions. Take 1/2 tablet x7 days. If tolerating well, increase to 7mg  daily thereafter. If not covered by insurance, log on to [email or web address] and download discount savings card. - Oral   Sent to pharmacy as: Semaglutide (RYBELSUS) 7 MG Tab   Notes to Pharmacy: PA not required. If not covered by insurance, pt will need to log on to [email or web address] and download discount savings card.   E-Prescribing Status: Receipt confirmed by pharmacy (04/20/2019 8:41 AM EDT)    Please note: directions were given on Rx to indicate to the pharmacist that if not covered by insurance, pt will need to log on to [email or web address] to download savings card.

## 2019-04-21 ENCOUNTER — Other Ambulatory Visit: Payer: Self-pay | Admitting: Family

## 2019-04-21 ENCOUNTER — Other Ambulatory Visit: Payer: Self-pay

## 2019-04-21 ENCOUNTER — Encounter: Payer: Self-pay | Admitting: Family

## 2019-04-21 ENCOUNTER — Ambulatory Visit (INDEPENDENT_AMBULATORY_CARE_PROVIDER_SITE_OTHER): Payer: PPO | Admitting: Family

## 2019-04-21 VITALS — BP 108/78 | HR 61 | Temp 98.2°F | Ht 61.0 in | Wt 162.0 lb

## 2019-04-21 DIAGNOSIS — I10 Essential (primary) hypertension: Secondary | ICD-10-CM

## 2019-04-21 DIAGNOSIS — Z8669 Personal history of other diseases of the nervous system and sense organs: Secondary | ICD-10-CM

## 2019-04-21 DIAGNOSIS — Z1231 Encounter for screening mammogram for malignant neoplasm of breast: Secondary | ICD-10-CM | POA: Diagnosis not present

## 2019-04-21 DIAGNOSIS — Z1211 Encounter for screening for malignant neoplasm of colon: Secondary | ICD-10-CM | POA: Diagnosis not present

## 2019-04-21 DIAGNOSIS — Z1212 Encounter for screening for malignant neoplasm of rectum: Secondary | ICD-10-CM | POA: Diagnosis not present

## 2019-04-21 DIAGNOSIS — M62838 Other muscle spasm: Secondary | ICD-10-CM | POA: Diagnosis not present

## 2019-04-21 MED ORDER — ATENOLOL 100 MG PO TABS: 100.0000 mg | ORAL_TABLET | Freq: Every day | ORAL | 3 refills | Status: DC

## 2019-04-21 MED ORDER — IRBESARTAN 75 MG PO TABS: 75.0000 mg | ORAL_TABLET | Freq: Every day | ORAL | 3 refills | Status: DC

## 2019-04-21 MED ORDER — ALBUTEROL SULFATE HFA 108 (90 BASE) MCG/ACT IN AERS: INHALATION_SPRAY | RESPIRATORY_TRACT | 0 refills | Status: DC

## 2019-04-21 MED ORDER — METHOCARBAMOL 500 MG PO TABS: 500.0000 mg | ORAL_TABLET | Freq: Three times a day (TID) | ORAL | 1 refills | Status: DC | PRN

## 2019-04-21 MED ORDER — NAPROXEN 500 MG PO TABS: 500.0000 mg | ORAL_TABLET | Freq: Two times a day (BID) | ORAL | 1 refills | Status: DC

## 2019-04-21 NOTE — Progress Notes (Signed)
[name] is a 53 y.o. female with the following history as recorded in EpicCare:  Patient Active Problem List   Diagnosis Date Noted  . ASCUS with positive high risk HPV cervical 03/13/2017  . Type 2 diabetes mellitus with hyperglycemia, without long-term current use of insulin ([place]) 03/28/2016  . Routine general medical examination at a health care facility 01/24/2016  . Acquired hypothyroidism 01/24/2016  . Anemia 04/14/2014  . Hyperlipidemia 08/18/2013  . Essential hypertension 08/26/2012  . Irregular menses 08/07/2012  . History of abnormal Pap smear 08/07/2012  . GERD (gastroesophageal reflux disease) 08/07/2012  . Asthma, mild persistent 08/07/2012  . Migraine 08/07/2012  . History of PCOS 08/07/2012  . Genital warts 08/07/2012  . S/P cholecystectomy 08/07/2012  . Seasonal and perennial allergic rhinitis 08/07/2012  . Severe episode of recurrent major depressive disorder ([place]) 12/06/2011  . PTSD (post-traumatic stress disorder) 11/02/2011    Current Outpatient Medications  Medication Sig Dispense Refill  . Alpha-Lipoic Acid 600 MG CAPS Take 300 mg by mouth 2 (two) times daily.     . Blood Glucose Monitoring Suppl (ONE TOUCH ULTRA MINI) w/Device KIT Use to check blood sugar 2 times a day. 1 kit 0  . clobetasol cream (TEMOVATE) 8.58 % Apply 1 application topically 2 (two) times daily. 30 g 1  . clonazePAM (KLONOPIN) 0.5 MG tablet Take 0.5 tablets (0.25 mg total) by mouth daily. 15 tablet 0  . CYCLOSET 0.8 MG TABS TAKE 3 TABLETS BY MOUTH DAILY IN THE MORNING 270 tablet 0  . Digestive Enzymes (ENZYME DIGEST PO) Take by mouth.    . fexofenadine (ALLEGRA) 180 MG tablet Take 180 mg by mouth daily.    Marland Kitchen glipiZIDE (GLUCOTROL) 5 MG tablet TAKE 2 TABLETs BY MOUTH EVERY MORNING AND 2 TABLETS BEFORE DINNER 360 tablet 3  . glucose blood (ONE TOUCH ULTRA TEST) test strip USE ONCE A DAY AS DIRECTED 100 each 2  . irbesartan (AVAPRO) 75 MG tablet Take 1 tablet (75 mg total) by mouth  daily. 90 tablet 3  . Lactobacillus Rhamnosus, GG, (CULTURELLE) CAPS Take by mouth.    . levothyroxine (SYNTHROID) 50 MCG tablet TAKE 1 TABLET BY MOUTH DAILY 30 tablet 2  . methocarbamol (ROBAXIN) 500 MG tablet Take 1 tablet (500 mg total) by mouth every 8 (eight) hours as needed for muscle spasms. 30 tablet 1  . naphazoline-pheniramine (VISINE-A) 0.025-0.3 % ophthalmic solution Place 1 drop into both eyes 2 (two) times daily.    . naproxen (NAPROSYN) 500 MG tablet Take 1 tablet (500 mg total) by mouth 2 (two) times daily with a meal. 30 tablet 1  . norelgestromin-ethinyl estradiol Marilu Favre) 150-35 MCG/24HR transdermal patch Place 1 patch onto the skin once a week. Continuous use 12 patch 4  . OVER THE COUNTER MEDICATION 1 capsule daily. Probiotic renew life    . OVER THE COUNTER MEDICATION     . Semaglutide (RYBELSUS) 7 MG TABS Take 7 mg by mouth See admin instructions. Take 1/2 tablet x7 days. If tolerating well, increase to 30m daily thereafter. If not covered by insurance, log on to [employer] and download discount savings card. 30 tablet 5  . sertraline (ZOLOFT) 100 MG tablet Take 2 tablets (200 mg total) by mouth daily. 180 tablet 0  . albuterol (VENTOLIN HFA) 108 (90 Base) MCG/ACT inhaler INHALE 2 PUFFS BY MOUTH EVERY 4 TO 6 HOURS AS DIRECTED(RESCUE) 42.5 g 1  . atenolol (TENORMIN) 100 MG tablet Take 1 tablet (100 mg total) by  mouth daily. 180 tablet 3   No current facility-administered medications for this visit.     Allergies: Latex, Fluticasone, Metformin and related, and Pravastatin  Past Medical History:  Diagnosis Date  . Abnormal Papanicolaou smear of cervix with positive human papilloma virus (HPV) test 02/2017  . Anxiety   . Asthma   . Depression   . Diabetes mellitus type II   . Food intolerance   . Headache   . High blood pressure   . IBS (irritable bowel syndrome)   . Insomnia   . Migraines   . PCOS (polycystic ovarian syndrome)   . PTSD (post-traumatic  stress disorder)   . Seasonal allergies   . Thyroid disease     Past Surgical History:  Procedure Laterality Date  . CHOLECYSTECTOMY    . GALLBLADDER SURGERY      Family History  Problem Relation Age of Onset  . Bipolar disorder Mother   . Hypertension Mother   . Diabetes Mother   . Alzheimer's disease Father   . Hypertension Father   . Alzheimer's disease Maternal Grandmother   . Heart disease Paternal Grandfather     Social History   Tobacco Use  . Smoking status: Never Smoker  . Smokeless tobacco: Never Used  Substance Use Topics  . Alcohol use: Yes    Alcohol/week: 0.5 standard drinks    Types: 1 Standard drinks or equivalent per week    Comment: 1-2 drinks a month    Subjective:  Presents to follow-up on chronic care needs/ medication refills; has hypertension and migraine medications managed through our office; sees GYN, endocrine and psychiatrist for majority of healthcare needs. Denies any chest pain, shortness of breath, blurred vision or headache Scheduled for CPE with her GYN next month; overdue for mammogram, colon cancer screen;   Objective:  Vitals:   04/21/19 1216  BP: 108/78  Pulse: 61  Temp: 98.2 F (36.8 C)  TempSrc: Oral  SpO2: 97%  Weight: 162 lb (73.5 kg)  Height: _0  (1.549 m)    General: Well developed, well nourished, in no acute distress  Skin : Warm and dry.  Head: Normocephalic and atraumatic  Lungs: Respirations unlabored; clear to auscultation bilaterally without wheeze, rales, rhonchi  CVS exam: normal rate and regular rhythm.  Neurologic: Alert and oriented; speech intact; face symmetrical; moves all extremities well; CNII-XII intact without focal deficit   Assessment:  1. Essential hypertension   2. History of migraine   3. Muscle spasms of neck   4. Screening mammogram, encounter for   5. Encounter for colorectal cancer screening     Plan:  1. Stable; refills updated; 2. & 3. Stable; refills updated; 4. Order  updated; 5. Defers colonoscopy- agrees to Cologuard; test ordered;  No follow-ups on file.  Orders Placed This Encounter  Procedures  . MM Digital Screening    Standing Status:   Future    Standing Expiration Date:   06/20/2020    Order Specific Question:   Reason for Exam (SYMPTOM  OR DIAGNOSIS REQUIRED)    Answer:   screening mammogram    Order Specific Question:   Is the patient pregnant?    Answer:   No    Order Specific Question:   Preferred imaging location?    Answer:   [facility]    Requested Prescriptions   Signed Prescriptions Disp Refills  . atenolol (TENORMIN) 100 MG tablet 180 tablet 3    Sig: Take 1 tablet (100 mg total) by  mouth daily.  . naproxen (NAPROSYN) 500 MG tablet 30 tablet 1    Sig: Take 1 tablet (500 mg total) by mouth 2 (two) times daily with a meal.  . methocarbamol (ROBAXIN) 500 MG tablet 30 tablet 1    Sig: Take 1 tablet (500 mg total) by mouth every 8 (eight) hours as needed for muscle spasms.  . irbesartan (AVAPRO) 75 MG tablet 90 tablet 3    Sig: Take 1 tablet (75 mg total) by mouth daily.

## 2019-04-23 ENCOUNTER — Telehealth: Payer: Self-pay | Admitting: Family

## 2019-04-23 NOTE — Telephone Encounter (Signed)
Pt went to pick up medication and her Rx for atenolol (TENORMIN) 100 MG tablet  Says take 1 a day and was for 90 tablets but she sates she normally takes 2 a day and gets 180 tablets/ please advise about the remaining tablets or a new Rx   Please call Pt and if she doesn't answer, please leave a message

## 2019-04-24 ENCOUNTER — Other Ambulatory Visit: Payer: Self-pay | Admitting: Family

## 2019-04-24 MED ORDER — ATENOLOL 100 MG PO TABS: 100.0000 mg | ORAL_TABLET | Freq: Two times a day (BID) | ORAL | 3 refills | Status: DC

## 2019-04-24 NOTE — Telephone Encounter (Signed)
Sorry about that- prescription has been corrected for #180 and bid.

## 2019-04-24 NOTE — Telephone Encounter (Signed)
Called and left message that script had been sent in for her today.

## 2019-05-09 ENCOUNTER — Other Ambulatory Visit: Payer: Self-pay | Admitting: Obstetrics & Gynecology

## 2019-05-11 NOTE — Telephone Encounter (Signed)
CE is scheduled for 05/27/19.

## 2019-05-12 ENCOUNTER — Ambulatory Visit (INDEPENDENT_AMBULATORY_CARE_PROVIDER_SITE_OTHER): Payer: PPO | Admitting: Psychiatry

## 2019-05-12 ENCOUNTER — Other Ambulatory Visit: Payer: Self-pay

## 2019-05-12 ENCOUNTER — Encounter (HOSPITAL_COMMUNITY): Payer: Self-pay | Admitting: Psychiatry

## 2019-05-12 DIAGNOSIS — F4310 Post-traumatic stress disorder, unspecified: Secondary | ICD-10-CM | POA: Diagnosis not present

## 2019-05-12 DIAGNOSIS — F3341 Major depressive disorder, recurrent, in partial remission: Secondary | ICD-10-CM | POA: Diagnosis not present

## 2019-05-12 DIAGNOSIS — F411 Generalized anxiety disorder: Secondary | ICD-10-CM

## 2019-05-12 MED ORDER — SERTRALINE HCL 100 MG PO TABS: 200.0000 mg | ORAL_TABLET | Freq: Every day | ORAL | 0 refills | Status: DC

## 2019-05-12 MED ORDER — CLONAZEPAM 0.5 MG PO TABS: 0.2500 mg | ORAL_TABLET | Freq: Every day | ORAL | 0 refills | Status: DC
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
## 2019-05-27 ENCOUNTER — Encounter: Payer: PPO | Admitting: Obstetrics & Gynecology

## 2019-06-03 LAB — HM DIABETES EYE EXAM

## 2019-06-09 ENCOUNTER — Encounter: Payer: Self-pay | Admitting: Family

## 2019-06-09 NOTE — Progress Notes (Signed)
Outside notes received. Information abstracted. Notes sent to scan.  

## 2019-06-10 ENCOUNTER — Telehealth: Payer: Self-pay | Admitting: Family

## 2019-06-10 ENCOUNTER — Other Ambulatory Visit: Payer: Self-pay | Admitting: Internal Medicine

## 2019-06-10 NOTE — Telephone Encounter (Signed)
Medication Refill - Medication: atenolol (TENORMIN) 100 MG tablet/ Pt stated pharmacy needs a new rx with updated Instructions for one 100MG  tablet 2x per day. Pharmacy stated they did not have updated rx with those instructions. Please advise.  Has the patient contacted their pharmacy? Yes.   (Agent: If no, request that the patient contact the pharmacy for the refill.) (Agent: If yes, when and what did the pharmacy advise?)  Preferred Pharmacy (with phone number or street name):  [id number] - [place], [place] [phone] (Phone) [phone] (Fax)     Agent: Please be advised that RX refills may take up to 3 business days. We ask that you follow-up with your pharmacy.

## 2019-06-15 ENCOUNTER — Other Ambulatory Visit: Payer: Self-pay | Admitting: Family

## 2019-06-15 MED ORDER — ATENOLOL 100 MG PO TABS: 100.0000 mg | ORAL_TABLET | Freq: Two times a day (BID) | ORAL | 3 refills | Status: DC

## 2019-06-15 NOTE — Telephone Encounter (Signed)
Pt is calling the pharm has directions one pill daily not two pills daily. Pt needs new rx generic atenolol 100 mg twice a day. [facility] [place]

## 2019-06-15 NOTE — Telephone Encounter (Signed)
Patient still has refills on medication.

## 2019-06-16 ENCOUNTER — Other Ambulatory Visit: Payer: Self-pay | Admitting: Family

## 2019-06-16 MED ORDER — ATENOLOL 100 MG PO TABS: 100.0000 mg | ORAL_TABLET | Freq: Two times a day (BID) | ORAL | 3 refills | Status: DC

## 2019-06-16 NOTE — Telephone Encounter (Signed)
atenolol (TENORMIN) 100 MG tablet  [facility] [place] - [address] [phone] (Phone) [phone] (Fax)   Please recall this in, not picking from [facility] they will not use her insurance. Please [place] above

## 2019-06-25 ENCOUNTER — Ambulatory Visit: Payer: PPO

## 2019-07-16 ENCOUNTER — Other Ambulatory Visit: Payer: Self-pay

## 2019-07-17 ENCOUNTER — Ambulatory Visit (INDEPENDENT_AMBULATORY_CARE_PROVIDER_SITE_OTHER): Payer: PPO | Admitting: Obstetrics & Gynecology

## 2019-07-17 ENCOUNTER — Encounter: Payer: Self-pay | Admitting: Obstetrics & Gynecology

## 2019-07-17 VITALS — BP 110/78 | Ht 61.0 in | Wt 156.0 lb

## 2019-07-17 DIAGNOSIS — Z7251 High risk heterosexual behavior: Secondary | ICD-10-CM

## 2019-07-17 DIAGNOSIS — Z01419 Encounter for gynecological examination (general) (routine) without abnormal findings: Secondary | ICD-10-CM

## 2019-07-17 DIAGNOSIS — Z113 Encounter for screening for infections with a predominantly sexual mode of transmission: Secondary | ICD-10-CM | POA: Diagnosis not present

## 2019-07-17 DIAGNOSIS — Z30015 Encounter for initial prescription of vaginal ring hormonal contraceptive: Secondary | ICD-10-CM | POA: Diagnosis not present

## 2019-07-17 DIAGNOSIS — E663 Overweight: Secondary | ICD-10-CM | POA: Diagnosis not present

## 2019-07-17 MED ORDER — ETONOGESTREL-ETHINYL ESTRADIOL 0.12-0.015 MG/24HR VA RING: 1.0000 | VAGINAL_RING | VAGINAL | 4 refills | Status: DC

## 2019-07-17 NOTE — Progress Notes (Signed)
[name] January 05, 1966 [id number]   History:    53 y.o. G0 Stable boyfriend with Parkinson.  RP:  Established patient presenting for annual gyn exam   HPI: On Xulane with frequent BTB.  No pelvic pain.  Not sexually active x boyfriend's diagnosis of Parkinson earlier this year.  Breasts normal.  Urine/BMs normal.  BMI 29.48.  Walks her dogs daily.  Health labs with Fam MD.  On Atenolol for cHTN. DM type 2.  Past medical history,surgical history, family history and social history were all reviewed and documented in the EPIC chart.  Gynecologic History No LMP recorded. (Menstrual status: Perimenopausal). Contraception: Ortho-Evra patches weekly Last Pap: 02/2018. Results were: Negative/HPV HR neg.  Chlam positive treated. Last mammogram: 07/2012. Results were: Neg Bone Density: Never Colonoscopy: 5 yrs ago  Obstetric History OB History  Gravida Para Term Preterm AB Living  0 0 0 0 0 0  SAB TAB Ectopic Multiple Live Births  0 0 0 0       ROS: A ROS was performed and pertinent positives and negatives are included in the history.  GENERAL: No fevers or chills. HEENT: No change in vision, no earache, sore throat or sinus congestion. NECK: No pain or stiffness. CARDIOVASCULAR: No chest pain or pressure. No palpitations. PULMONARY: No shortness of breath, cough or wheeze. GASTROINTESTINAL: No abdominal pain, nausea, vomiting or diarrhea, melena or bright red blood per rectum. GENITOURINARY: No urinary frequency, urgency, hesitancy or dysuria. MUSCULOSKELETAL: No joint or muscle pain, no back pain, no recent trauma. DERMATOLOGIC: No rash, no itching, no lesions. ENDOCRINE: No polyuria, polydipsia, no heat or cold intolerance. No recent change in weight. HEMATOLOGICAL: No anemia or easy bruising or bleeding. NEUROLOGIC: No headache, seizures, numbness, tingling or weakness. PSYCHIATRIC: No depression, no loss of interest in normal activity or change in sleep pattern.     Exam:   BP  110/78 (BP Location: Right Arm, Patient Position: Sitting, Cuff Size: Normal)   Ht 5\' 1"  (1.549 m)   Wt 156 lb (70.8 kg)   BMI 29.48 kg/m   Body mass index is 29.48 kg/m.  General appearance : Well developed well nourished female. No acute distress HEENT: Eyes: no retinal hemorrhage or exudates,  Neck supple, trachea midline, no carotid bruits, no thyroidmegaly Lungs: Clear to auscultation, no rhonchi or wheezes, or rib retractions  Heart: Regular rate and rhythm, no murmurs or gallops Breast:Examined in sitting and supine position were symmetrical in appearance, no palpable masses or tenderness,  no skin retraction, no nipple inversion, no nipple discharge, no skin discoloration, no axillary or supraclavicular lymphadenopathy Abdomen: no palpable masses or tenderness, no rebound or guarding Extremities: no edema or skin discoloration or tenderness  Pelvic: Vulva: Normal             Vagina: No gross lesions or discharge  Cervix: No gross lesions or discharge.  Pap reflex/Gono-Chlam done.  Uterus  AV, normal size, shape and consistency, non-tender and mobile  Adnexa  Without masses or tenderness  Anus: Normal   Assessment/Plan:  53 y.o. female for annual exam   1. Encounter for routine gynecological examination with Papanicolaou smear of cervix Normal gynecologic exam.  Pap reflex done.  Breast exam normal.  Screening mammogram well overdue, patient will schedule now.  Colonoscopy 5 years ago.  Health labs with family physician.  2. Encounter for initial prescription of vaginal ring hormonal contraceptive Frequent breakthrough bleeding on Xulane patch.  Decision to change to NuvaRing.  Usage, risks  and benefits reviewed with patient.  Prescription sent to pharmacy.  If breakthrough bleeding persists on the NuvaRing, patient will call to schedule a pelvic ultrasound to investigate.  3. Screen for STD (sexually transmitted disease) Gonorrhea and chlamydia done on the Pap test.  Patient  declines other STD screening.  4. Overweight (BMI 25.0-29.9) Recommend a slightly lower calorie/carb diet such as [employer].  Aerobic physical activities 5 times a week and weightlifting every 2 days.  Other orders - etonogestrel-ethinyl estradiol (NUVARING) 0.12-0.015 MG/24HR vaginal ring; Place 1 each vaginally every 28 (twenty-eight) days. Insert vaginally and leave in place for 4 consecutive weeks, then switch rings.  Princess Bruins MD, 12:44 PM 07/17/2019

## 2019-07-20 ENCOUNTER — Other Ambulatory Visit: Payer: Self-pay

## 2019-07-20 ENCOUNTER — Encounter: Payer: Self-pay | Admitting: Obstetrics & Gynecology

## 2019-07-20 ENCOUNTER — Encounter: Payer: Self-pay | Admitting: Internal Medicine

## 2019-07-20 ENCOUNTER — Ambulatory Visit (INDEPENDENT_AMBULATORY_CARE_PROVIDER_SITE_OTHER): Payer: PPO | Admitting: Internal Medicine

## 2019-07-20 VITALS — BP 108/60 | HR 67 | Ht 61.0 in | Wt 159.0 lb

## 2019-07-20 DIAGNOSIS — E782 Mixed hyperlipidemia: Secondary | ICD-10-CM

## 2019-07-20 DIAGNOSIS — Z23 Encounter for immunization: Secondary | ICD-10-CM | POA: Diagnosis not present

## 2019-07-20 DIAGNOSIS — E039 Hypothyroidism, unspecified: Secondary | ICD-10-CM | POA: Diagnosis not present

## 2019-07-20 DIAGNOSIS — E1165 Type 2 diabetes mellitus with hyperglycemia: Secondary | ICD-10-CM

## 2019-07-20 LAB — POCT GLYCOSYLATED HEMOGLOBIN (HGB A1C): Hemoglobin A1C: 7.1 % — AB (ref 4.0–5.6)

## 2019-07-20 MED ORDER — GLIPIZIDE 5 MG PO TABS: ORAL_TABLET | ORAL | 3 refills | Status: DC

## 2019-07-20 MED ORDER — RYBELSUS 7 MG PO TABS: 1.0000 | ORAL_TABLET | Freq: Every day | ORAL | 11 refills | Status: DC

## 2019-07-20 MED ORDER — FLUVASTATIN SODIUM ER 80 MG PO TB24: 80.0000 mg | ORAL_TABLET | Freq: Every day | ORAL | 11 refills | Status: DC

## 2019-07-20 NOTE — Addendum Note (Signed)
Addended by: Cardell Peach I on: 07/20/2019 01:56 PM   Modules accepted: Orders

## 2019-07-20 NOTE — Patient Instructions (Addendum)
Please continue: - Glipizide 10 mg twice a day before lunch and supper - Cycloset 2.4 mg daily in am  Please restart: - Rybelsus 3.5 mg before first meal of the day x 2 weeks, then increased to 7 mg daily.  Continue levothyroxine 50 mcg daily.  Take the thyroid hormone every day, with water, at least 30 minutes before breakfast, separated by at least 4 hours from: - acid reflux medications - calcium - iron - multivitamins  Please return in 4 months with your sugar log.

## 2019-07-20 NOTE — Progress Notes (Signed)
Patient ID: [name], female   DOB: 10-Jul-1966, 53 y.o.   MRN: [id number]  HPI: [name] is a 53 y.o.-year-old female, returning for f/u for DM2, dx 2013, non-insulin-dependent, uncontrolled, without long-term complications and also hypothyroidism. Last visit 3 months ago.  Since last visit, she moved into a new apartment in a safe neighborhood and she started to walk outside with her dog.  She feels that sleeps better.   DM2: Last hemoglobin A1c was: Lab Results  Component Value Date   HGBA1C 7.3 (A) 03/31/2019   HGBA1C 6.6 (A) 07/30/2018   HGBA1C 6.5 (A) 03/28/2018   Pt is on a regimen of: - Glipizide 5 mg 3x a day before lunch and supper - Cycloset 2.4 mg daily in am - Rybelsus 7 mg before first meal of the day She did not start Welchol 2 tabs 2x a day - added 03/2017 - too expensive On Turmeric, alpha-lipoic acid + 660 mcg biotin per day, Garlicin.  She had various intolerances to different diabetes medications:  Metformin ER >> in the past, she had gastric discomfort and severe diarrhea (loss of bowel control).  However, we restarted this 08/2017 and she is tolerating this well.  Januvia >> tried for 1-2 months >> left chest pain  She was wondering if she could take Invokana, however she had multiple vaginal yeast infections. She Also had nocturia 3-4 x a night.   She had reactive hypoglycemia symptoms while on Quercetin (was taking it for allergies) >> we stopped this it is known to increase insulin production by the pancreas.  She does not check her sugars (!).  From last visits: - am: 108-171, 295 (had 3 bags of sweets) >> 136, 149  - 1h after b'fast: 207 (after banana bread) >> n/c - before lunch:169-170s, 200s (icecream) >> 161, 200 - 2h after lunch: 187 >> n/c >> 172 >> n/c - bedtime: 142-223 >> 153, 239 >> n/c >> 116-198 - nighttime: 160-200s >> n/c >> 262 >>192, 217  She has many food intolerances, - Gluten >> cannot eat bread or pastry.  - raw  vegetables, beans, cruciferous vegetables, fruit >> diarrhea.  - No dairy, however, she does eat ice cream - She does not digest fat well after her cholecystectomy in 2011.   Meter: One Touch Mini  -No CAD, last BUN/creatinine was:  Lab Results  Component Value Date   BUN 16 03/31/2019   CREATININE 0.81 03/31/2019  On irbesartan.  -+ HL: Lab Results  Component Value Date   CHOL 405 (H) 03/31/2019   HDL 130.40 03/31/2019   LDLCALC 237 (H) 03/31/2019   LDLDIRECT 205.0 01/24/2016   TRIG 185.0 (H) 03/31/2019   CHOLHDL 3 03/31/2019  Previous cholesterol levels were: 325/197/132/154.   She could not tolerate pravastatin and Livalo due to muscle aches.  She refused a referral to lipid clinic in the past.  - last dilated eye exam was in 05/2019: No DR (Dr Prudencio Burly) , No Macular degeneration.  -+ Numbness and tingling in her legs.  She is on R enantiomer of alpha-lipoic acid.  She also has a history of PCOS- she has seen Dr. Barry Dienes at [facility] in the past - note from 08/26/2012: perimenopause, and at that time she was on Yasmin, subsequently c[facility]hanged to [facility]. She sees Dr. Vernice Jefferson with OB/GYN. She has PTSD, MDD, insomnia, anxiety, asthma, HTN, GERD, status post cholecystectomy, anemia, transaminitis. She also had increased uterine bleeding (on Necon). She continues seeing an Systems developer.  She takes Nettle powder >> helped her with diarrhea, anemia, insomnia. 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 Also, Undecylenic acid and mastic gum, no carbs x 1-2 months.  She has IBS >> better on L- plantarum, S.bulardii - Brevibacillus Laterosporus 20 min before b'fast.   Hypothyroidism  -Diagnosed 02/2018 -Negative antithyroid antibodies  Reviewed her TFTs: Lab Results  Component Value Date   TSH 2.96 04/01/2019   TSH 1.26 07/30/2018   TSH 10.64 (H) 04/04/2018   TSH 4.80 (H) 03/13/2018   TSH 2.91 01/24/2016   TSH 2.322 02/26/2013   TSH  2.050 08/07/2012   FREET4 0.60 04/01/2019   FREET4 0.84 07/30/2018   FREET4 0.72 04/04/2018   T3FREE 2.6 04/04/2018   Component     Latest Ref Rng & Units 04/04/2018  Thyroglobulin Ab     < or = 1 IU/mL <1  Thyroperoxidase Ab SerPl-aCnc     <9 IU/mL 2   We started levothyroxine 03/2018.  She is on levothyroxine 50 mcg daily: - in am - fasting - at least 30 min from b'fast - no Ca, Fe, MVI, PPIs - not on Biotin  No FH of thyroid disease or cancer. No h/o radiation tx to head or neck.  No herbal supplements. No Biotin use. No recent steroids use.   Pt denies: - feeling nodules in neck - hoarseness - dysphagia - choking - SOB with lying down  She felt better after starting levothyroxine.  ROS: Constitutional: no weight gain/no weight loss, no fatigue, no subjective hyperthermia, no subjective hypothermia Eyes: no blurry vision, no xerophthalmia ENT: no sore throat, + see HPI Cardiovascular: no CP/no SOB/no palpitations/no leg swelling Respiratory: no cough/no SOB/no wheezing Gastrointestinal: no N/no V/no D/no C/no acid reflux Musculoskeletal: no muscle aches/no joint aches Skin: no rashes, no hair loss Neurological: no tremors/+ numbness/+ tingling/no dizziness  I reviewed pt's medications, allergies, PMH, social hx, family hx, and changes were documented in the history of present illness. Otherwise, unchanged from my initial visit note.  Past Medical History:  Diagnosis Date  . Abnormal Papanicolaou smear of cervix with positive human papilloma virus (HPV) test 02/2017  . Anxiety   . Asthma   . Depression   . Diabetes mellitus type II   . Food intolerance   . Headache   . High blood pressure   . IBS (irritable bowel syndrome)   . Insomnia   . Migraines   . PCOS (polycystic ovarian syndrome)   . PTSD (post-traumatic stress disorder)   . Seasonal allergies   . Thyroid disease    Past Surgical History:  Procedure Laterality Date  . CHOLECYSTECTOMY    .  GALLBLADDER SURGERY     Social History   Socioeconomic History  . Marital status: Single    Spouse name: Not on file  . Number of children: 0  . Years of education: 54  . Highest education level: Not on file  Occupational History  . Occupation: Long Term Disability    Comment: PTSD  Social Needs  . Financial resource strain: Not on file  . Food insecurity    Worry: Not on file    Inability: Not on file  . Transportation needs    Medical: Not on file    Non-medical: Not on file  Tobacco Use  . Smoking status: Never Smoker  . Smokeless tobacco: Never Used  Substance and Sexual Activity  . Alcohol use: Not Currently    Alcohol/week:  0.5 standard drinks    Types: 1 Standard drinks or equivalent per week  . Drug use: No  . Sexual activity: Not Currently    Partners: Male    Birth control/protection: Patch    Comment: 1st intercourse- 23, partners- 21, current partner- 30yr  Lifestyle  . Physical activity    Days per week: Not on file    Minutes per session: Not on file  . Stress: Not on file  Relationships  . Social [place] phone: Not on file    Gets together: Not on file    Attends religious service: Not on file    Active member of club or organization: Not on file    Attends meetings of clubs or organizations: Not on file    Relationship status: Not on file  . Intimate partner violence    Fear of current or ex partner: Not on file    Emotionally abused: Not on file    Physically abused: Not on file    Forced sexual activity: Not on file  Other Topics Concern  . Not on file  Social History Narrative   Regular exercise: yes, walk   Caffeine use: occasionally tea   Fun: Social group events, walking her dogs, movies   Denies abuse and feels safe where she lives.    Current Outpatient Medications on File Prior to Visit  Medication Sig Dispense Refill  . albuterol (VENTOLIN HFA) 108 (90 Base) MCG/ACT inhaler INHALE 2 PUFFS BY MOUTH EVERY 4 TO 6  HOURS AS DIRECTED(RESCUE) 42.5 g 1  . Alpha-Lipoic Acid 600 MG CAPS Take 300 mg by mouth 2 (two) times daily.     .Marland Kitchenatenolol (TENORMIN) 100 MG tablet Take 1 tablet (100 mg total) by mouth 2 (two) times daily. 180 tablet 3  . Blood Glucose Monitoring Suppl (ONE TOUCH ULTRA MINI) w/Device KIT Use to check blood sugar 2 times a day. 1 kit 0  . clobetasol cream (TEMOVATE) 04.16% Apply 1 application topically 2 (two) times daily. 30 g 1  . clonazePAM (KLONOPIN) 0.5 MG tablet Take 0.5 tablets (0.25 mg total) by mouth daily. 15 tablet 0  . CYCLOSET 0.8 MG TABS TAKE 3 TABLETS BY MOUTH DAILY IN THE MORNING 270 tablet 0  . Digestive Enzymes (ENZYME DIGEST PO) Take by mouth.    . etonogestrel-ethinyl estradiol (NUVARING) 0.12-0.015 MG/24HR vaginal ring Place 1 each vaginally every 28 (twenty-eight) days. Insert vaginally and leave in place for 4 consecutive weeks, then switch rings. 3 each 4  . fexofenadine (ALLEGRA) 180 MG tablet Take 180 mg by mouth daily.    .Marland KitchenglipiZIDE (GLUCOTROL) 5 MG tablet TAKE 2 TABLETs BY MOUTH EVERY MORNING AND 2 TABLETS BEFORE DINNER 360 tablet 3  . glucose blood (ONE TOUCH ULTRA TEST) test strip USE ONCE A DAY AS DIRECTED 100 each 2  . irbesartan (AVAPRO) 75 MG tablet Take 1 tablet (75 mg total) by mouth daily. 90 tablet 3  . levothyroxine (SYNTHROID) 50 MCG tablet TAKE 1 TABLET BY MOUTH DAILY 30 tablet 2  . methocarbamol (ROBAXIN) 500 MG tablet Take 1 tablet (500 mg total) by mouth every 8 (eight) hours as needed for muscle spasms. 30 tablet 1  . naphazoline-pheniramine (VISINE-A) 0.025-0.3 % ophthalmic solution Place 1 drop into both eyes 2 (two) times daily.    . naproxen (NAPROSYN) 500 MG tablet Take 1 tablet (500 mg total) by mouth 2 (two) times daily with a meal. 30 tablet 1  .  OVER THE COUNTER MEDICATION 1 capsule daily. Probiotic renew life    . OVER THE COUNTER MEDICATION     . sertraline (ZOLOFT) 100 MG tablet Take 2 tablets (200 mg total) by mouth daily. 180 tablet 0   . [DISCONTINUED] EFFEXOR XR 150 MG 24 hr capsule 375 mg daily.      No current facility-administered medications on file prior to visit.    Allergies  Allergen Reactions  . Latex Itching and Swelling  . Fluticasone     Nose bleeds  . Metformin And Related     GI side effects  . Pravastatin     myalgias   Family History  Problem Relation Age of Onset  . Bipolar disorder Mother   . Hypertension Mother   . Diabetes Mother   . Alzheimer's disease Father   . Hypertension Father   . Alzheimer's disease Maternal Grandmother   . Heart disease Paternal Grandfather     PE: BP 108/60   Pulse 67   Ht 5' 1"  (1.549 m)   Wt 159 lb (72.1 kg)   SpO2 97%   BMI 30.04 kg/m  Body mass index is 30.04 kg/m.  Wt Readings from Last 3 Encounters:  07/20/19 159 lb (72.1 kg)  07/17/19 156 lb (70.8 kg)  04/21/19 162 lb (73.5 kg)   Constitutional: overweight, in NAD Eyes: PERRLA, EOMI, no exophthalmos ENT: moist mucous membranes, no thyromegaly, no cervical lymphadenopathy Cardiovascular: RRR, No MRG Respiratory: CTA B Gastrointestinal: abdomen soft, NT, ND, BS+ Musculoskeletal: no deformities, strength intact in all 4 Skin: moist, warm, no rashes Neurological: no tremor with outstretched hands, DTR normal in all 4  ASSESSMENT: 1. DM2, non-insulin-dependent, fairly well controlled, without long term complications, but with hyperglycemia  2. HL - Very high LDL - high HDL - slightly high TG  3.  Obesity class 1  4.  Hypothyroidism   PLAN:  1. DM2 - Patient with fairly well-controlled diabetes but hindered by binge eating when stressed.  She saw nutrition in the past.  She also has many medication intolerances which limit her capacity to treat her diabetes.  She has not tolerated higher Metformin ER doses. -At last visit, HbA1c was higher, at 7.3% and she agreed to try Rybelsus (oral GLP-1 receptor agonist.  We started at a low dose, 3 mg daily, and increased to 7 mg daily >> but  stopped in 03/2019 b/c price. She was tolerating this well. She cannot remember if the sugars were better then.   -At this visit, she tells me that she can get Rybelsus cheaper, from an online pharmacy ($50 per month).  She would like to restart it.  I printed her a prescription.  I also strongly advised her to restart checking her sugars once a day, rotating check times. - I advised her to:  Patient Instructions  Please continue: - Glipizide 5 mg 3x a day before lunch and supper - Cycloset 2.4 mg daily in am  Please restart: - Rybelsus 3.5 mg before first meal of the day x 2 weeks, then increased to 7 mg daily.  Please return in 4 months with your sugar log.   - we checked her HbA1c: 7.1% (better) - restart check sugars at different times of the day - 1x a day, rotating check times - advised for yearly eye exams >> she is UTD - + flu shot today - return to clinic in 4 months    2. HL -Reviewed latest lipid panel from last visit:  LDL very high, at 237. Lab Results  Component Value Date   CHOL 405 (H) 03/31/2019   HDL 130.40 03/31/2019   LDLCALC 237 (H) 03/31/2019   LDLDIRECT 205.0 01/24/2016   TRIG 185.0 (H) 03/31/2019   CHOLHDL 3 03/31/2019  -She could not tolerate Livalo and pravastatin due to muscle aches.  We tried fluvastatin XL but this was not covered by her insurance.  In the last few days I received a message from pharmacy that her insurance now covers it.  She agrees to try it. -She declined a referral to lipid clinic for PCSK9 inhibitors.  3.  Obesity class 1 -Lost 3 pounds in the last 3 months -Continue Rybelsus which is also help with weight loss  4.  Hypothyroidism - latest thyroid labs reviewed with pt >> normal: Lab Results  Component Value Date   TSH 2.96 04/01/2019  - she continues on LT4 50 mcg daily - pt feels good on this dose. - we discussed about taking the thyroid hormone every day, with water, >30 minutes before breakfast, separated by >4 hours from  acid reflux medications, calcium, iron, multivitamins. Pt. is taking it correctly.  Philemon Kingdom, MD PhD [facility]

## 2019-07-20 NOTE — Patient Instructions (Signed)
1. Encounter for routine gynecological examination with Papanicolaou smear of cervix Normal gynecologic exam.  Pap reflex done.  Breast exam normal.  Screening mammogram well overdue, patient will schedule now.  Colonoscopy 5 years ago.  Health labs with family physician.  2. Encounter for initial prescription of vaginal ring hormonal contraceptive Frequent breakthrough bleeding on Xulane patch.  Decision to change to NuvaRing.  Usage, risks and benefits reviewed with patient.  Prescription sent to pharmacy.  If breakthrough bleeding persists on the NuvaRing, patient will call to schedule a pelvic ultrasound to investigate.  3. Screen for STD (sexually transmitted disease) Gonorrhea and chlamydia done on the Pap test.  Patient declines other STD screening.  4. Overweight (BMI 25.0-29.9) Recommend a slightly lower calorie/carb diet such as [employer].  Aerobic physical activities 5 times a week and weightlifting every 2 days.  Other orders - etonogestrel-ethinyl estradiol (NUVARING) 0.12-0.015 MG/24HR vaginal ring; Place 1 each vaginally every 28 (twenty-eight) days. Insert vaginally and leave in place for 4 consecutive weeks, then switch rings.  [name], it was a pleasure seeing you today!  I will inform you of your results as soon as they are available.

## 2019-07-21 LAB — PAP IG, CT-NG NAA, HPV HIGH-RISK
C. trachomatis RNA, TMA: NOT DETECTED
HPV DNA High Risk: NOT DETECTED
N. gonorrhoeae RNA, TMA: NOT DETECTED

## 2019-08-07 ENCOUNTER — Other Ambulatory Visit: Payer: Self-pay

## 2019-08-07 ENCOUNTER — Ambulatory Visit
Admission: RE | Admit: 2019-08-07 | Discharge: 2019-08-07 | Disposition: A | Discharge status: Home / Self Care | Payer: PPO | Source: Ambulatory Visit | Attending: Family | Admitting: Family

## 2019-08-07 DIAGNOSIS — Z1231 Encounter for screening mammogram for malignant neoplasm of breast: Secondary | ICD-10-CM | POA: Diagnosis not present

## 2019-08-31 ENCOUNTER — Telehealth (HOSPITAL_COMMUNITY): Payer: Self-pay | Admitting: Psychiatry

## 2019-08-31 MED ORDER — SERTRALINE HCL 100 MG PO TABS: 200.0000 mg | ORAL_TABLET | Freq: Every day | ORAL | 0 refills | Status: DC

## 2019-08-31 NOTE — Telephone Encounter (Signed)
Per pt- Pt does not have enough. She is past due. She needs refill today

## 2019-08-31 NOTE — Telephone Encounter (Signed)
Pt needs refill Zoloft.  [facility] [address]   Has apt 12/16

## 2019-08-31 NOTE — Telephone Encounter (Signed)
Ok sent!

## 2019-08-31 NOTE — Telephone Encounter (Signed)
Does she have enough till 12/16? Sometime  we change dose so should wait till then

## 2019-09-02 ENCOUNTER — Ambulatory Visit (INDEPENDENT_AMBULATORY_CARE_PROVIDER_SITE_OTHER): Payer: PPO | Admitting: Psychiatry

## 2019-09-02 ENCOUNTER — Encounter (HOSPITAL_COMMUNITY): Payer: Self-pay | Admitting: Psychiatry

## 2019-09-02 DIAGNOSIS — F4310 Post-traumatic stress disorder, unspecified: Secondary | ICD-10-CM

## 2019-09-02 DIAGNOSIS — F3341 Major depressive disorder, recurrent, in partial remission: Secondary | ICD-10-CM

## 2019-09-02 DIAGNOSIS — F411 Generalized anxiety disorder: Secondary | ICD-10-CM

## 2019-09-02 DIAGNOSIS — F332 Major depressive disorder, recurrent severe without psychotic features: Secondary | ICD-10-CM | POA: Diagnosis not present

## 2019-09-02 NOTE — Progress Notes (Signed)
Patient ID: [name], female   DOB: [birth year], 53 y.o.   MRN: [id number]   [place] Follow-up Outpatient Visit  [name] April 14, 1966 [id number] 53 y.o.  09/02/2019 11:38 AM   I connected with [name] on 09/02/19 at 11:30 AM EST by telephone and verified that I am speaking with the correct person using two identifiers. I discussed the limitations, risks, security and privacy concerns of performing an evaluation and management service by telephone and the availability of in person appointments. I also discussed with the patient that there may be a patient responsible charge related to this service. The patient expressed understanding and agreed to proceed.  Chief Complaint: follow up depression, anxiety  HPI Comments: [name] is  a 53 y/o female with a past psychiatric history significant for Post traumatic stress Disorder, Major Depressive Disorder, recurrent, severe and GAD.  Doing fair on meds Husband diagnosed with parkinsonism bu tnot interested in meds, somewhat stress related to that     tolerating zoloft Modifying factor: friends Has started doing Yoga helps     Past Medical Family, Social History:  Past Medical History:  Diagnosis Date  . Abnormal Papanicolaou smear of cervix with positive human papilloma virus (HPV) test 02/2017  . Anxiety   . Asthma   . Depression   . Diabetes mellitus type II   . Food intolerance   . Headache   . High blood pressure   . IBS (irritable bowel syndrome)   . Insomnia   . Migraines   . PCOS (polycystic ovarian syndrome)   . PTSD (post-traumatic stress disorder)   . Seasonal allergies   . Thyroid disease    Family History  Problem Relation Age of Onset  . Bipolar disorder Mother   . Hypertension Mother   . Diabetes Mother   . Alzheimer's disease Father   . Hypertension Father   . Alzheimer's disease Maternal Grandmother   . Heart disease Paternal Grandfather    Social History    Socioeconomic History  . Marital status: Single    Spouse name: Not on file  . Number of children: 0  . Years of education: 48  . Highest education level: Not on file  Occupational History  . Occupation: Long Term Disability    Comment: PTSD  Tobacco Use  . Smoking status: Never Smoker  . Smokeless tobacco: Never Used  Substance and Sexual Activity  . Alcohol use: Not Currently    Alcohol/week: 0.5 standard drinks    Types: 1 Standard drinks or equivalent per week  . Drug use: No  . Sexual activity: Not Currently    Partners: Male    Birth control/protection: Patch    Comment: 1st intercourse- 89, partners- 3, current partner- 56yr  Other Topics Concern  . Not on file  Social History Narrative   Regular exercise: yes, walk   Caffeine use: occasionally tea   Fun: Social group events, walking her dogs, movies   Denies abuse and feels safe where she lives.    Social Determinants of Health   Financial Resource Strain:   . Difficulty of Paying Living Expenses: Not on file  Food Insecurity:   . Worried About RCharity fundraiserin the Last Year: Not on file  . Ran Out of Food in the Last Year: Not on file  Transportation Needs:   . Lack of Transportation (Medical): Not on file  . Lack of Transportation (Non-Medical): Not on file  Physical Activity:   .  Days of Exercise per Week: Not on file  . Minutes of Exercise per Session: Not on file  Stress:   . Feeling of Stress : Not on file  Social Connections:   . Frequency of Communication with Friends and Family: Not on file  . Frequency of Social Gatherings with Friends and Family: Not on file  . Attends Religious Services: Not on file  . Active Member of Clubs or Organizations: Not on file  . Attends Archivist Meetings: Not on file  . Marital Status: Not on file  Intimate Partner Violence:   . Fear of Current or Ex-Partner: Not on file  . Emotionally Abused: Not on file  . Physically Abused: Not on file  .  Sexually Abused: Not on file    Outpatient Encounter Medications as of 09/02/2019  Medication Sig  . albuterol (VENTOLIN HFA) 108 (90 Base) MCG/ACT inhaler INHALE 2 PUFFS BY MOUTH EVERY 4 TO 6 HOURS AS DIRECTED(RESCUE)  . Alpha-Lipoic Acid 600 MG CAPS Take 300 mg by mouth 2 (two) times daily.   Marland Kitchen atenolol (TENORMIN) 100 MG tablet Take 1 tablet (100 mg total) by mouth 2 (two) times daily.  . Blood Glucose Monitoring Suppl (ONE TOUCH ULTRA MINI) w/Device KIT Use to check blood sugar 2 times a day.  . clobetasol cream (TEMOVATE) 0.62 % Apply 1 application topically 2 (two) times daily.  . clonazePAM (KLONOPIN) 0.5 MG tablet Take 0.5 tablets (0.25 mg total) by mouth daily.  . CYCLOSET 0.8 MG TABS TAKE 3 TABLETS BY MOUTH DAILY IN THE MORNING  . Digestive Enzymes (ENZYME DIGEST PO) Take by mouth.  . etonogestrel-ethinyl estradiol (NUVARING) 0.12-0.015 MG/24HR vaginal ring Place 1 each vaginally every 28 (twenty-eight) days. Insert vaginally and leave in place for 4 consecutive weeks, then switch rings.  . fexofenadine (ALLEGRA) 180 MG tablet Take 180 mg by mouth daily.  . fluvastatin XL (LESCOL XL) 80 MG 24 hr tablet Take 1 tablet (80 mg total) by mouth daily.  Marland Kitchen glipiZIDE (GLUCOTROL) 5 MG tablet TAKE 1 tab before each meal, 3x a day  . glucose blood (ONE TOUCH ULTRA TEST) test strip USE ONCE A DAY AS DIRECTED  . irbesartan (AVAPRO) 75 MG tablet Take 1 tablet (75 mg total) by mouth daily.  Marland Kitchen levothyroxine (SYNTHROID) 50 MCG tablet TAKE 1 TABLET BY MOUTH DAILY  . methocarbamol (ROBAXIN) 500 MG tablet Take 1 tablet (500 mg total) by mouth every 8 (eight) hours as needed for muscle spasms.  . naphazoline-pheniramine (VISINE-A) 0.025-0.3 % ophthalmic solution Place 1 drop into both eyes 2 (two) times daily.  . naproxen (NAPROSYN) 500 MG tablet Take 1 tablet (500 mg total) by mouth 2 (two) times daily with a meal.  . OVER THE COUNTER MEDICATION 1 capsule daily. Probiotic renew life  . OVER THE COUNTER  MEDICATION   . Semaglutide (RYBELSUS) 7 MG TABS Take 1 tablet by mouth daily.  . sertraline (ZOLOFT) 100 MG tablet Take 2 tablets (200 mg total) by mouth daily.  . [DISCONTINUED] EFFEXOR XR 150 MG 24 hr capsule 375 mg daily.    No facility-administered encounter medications on file as of 09/02/2019.     Physical Exam: Constitutional:  There were no vitals taken for this visit.  Review of Systems  Cardiovascular: Negative for chest pain.  Psychiatric/Behavioral: Negative for depression and substance abuse.   Physical Exam Vitals reviewed.  Constitutional:      General: She is not in acute distress.    Appearance: She  is well-developed. She is not diaphoretic.       Psychiatric Specialty exam: General Appearance:   Eye Contact::    Speech:  Clear and Coherent and Normal Rate  Volume:  Normal  Mood: fair  Affect:    Thought Process:  Coherent, Linear and Logical  Orientation:  Full (Time, Place, and Person)  Thought Content:  WDL  Suicidal Thoughts:  No  Homicidal Thoughts:  No  Memory:  Immediate;   Good Recent;   Good Remote;   Good  Judgement:  Fair  Insight:  Fair  Psychomotor Activity:  Normal  Concentration:  Good  Recall:  Good  Akathisia:  Negative  Language-Intact  Fund of knowledge-Average  Handed:  Right  AIMS (if indicated):     Assets:  Desire for Improvement Housing  Sleep:  Number of Hours: 1-9 hours     Assessment: Axis I: Post traumatic stress Disorder-Major Depressive Disorder, recurrent, severe- GAd    Plan of Care:  PLAN:   Affirm with the patient that the medications are taken as ordered. Patient  expressed understanding of how their medications were to be used.    Laboratory:  No labs warranted at this time.   Psychotherapy: Therapy: brief supportive therapy provided.  Discussed psychosocial stressors in detail. More than 50% of the visit was spent on individual therapy/counseling.   Medications:  Continue  the following  psychiatric medications as written prior to this appointment with the following changes::  1. Depression: doing fair, continue zoloft   2. PTSD:baseline, continue zoloft 3. GAD:  Fluctuates, takes small dose of klnopine prn Fu 51m  I discussed the assessment and treatment plan with the patient. The patient was provided an opportunity to ask questions and all were answered. The patient agreed with the plan and demonstrated an understanding of the instructions.   The patient was advised to call back or seek an in-person evaluation if the symptoms worsen or if the condition fails to improve as anticipated.  Wants to come in 4-5 m. Can call earlier for concerns            NFreddy Jaksch M.D.  09/02/2019 11:38 AM

## 2019-09-08 ENCOUNTER — Other Ambulatory Visit: Payer: Self-pay

## 2019-09-08 MED ORDER — CYCLOSET 0.8 MG PO TABS: ORAL_TABLET | ORAL | 0 refills | Status: DC

## 2019-09-10 ENCOUNTER — Other Ambulatory Visit: Payer: Self-pay

## 2019-09-10 DIAGNOSIS — E1165 Type 2 diabetes mellitus with hyperglycemia: Secondary | ICD-10-CM

## 2019-09-10 DIAGNOSIS — E039 Hypothyroidism, unspecified: Secondary | ICD-10-CM

## 2019-09-10 MED ORDER — LEVOTHYROXINE SODIUM 50 MCG PO TABS: 50.0000 ug | ORAL_TABLET | Freq: Every day | ORAL | 2 refills | Status: DC

## 2019-09-13 ENCOUNTER — Other Ambulatory Visit: Payer: Self-pay | Admitting: Family

## 2019-09-13 DIAGNOSIS — M62838 Other muscle spasm: Secondary | ICD-10-CM

## 2019-10-22 ENCOUNTER — Telehealth: Payer: Self-pay

## 2019-10-22 NOTE — Telephone Encounter (Signed)
Forms for [facility] patient assistance  filled out, signed by Dr. Elvera Lennox and mailed back.

## 2019-10-23 ENCOUNTER — Telehealth: Payer: Self-pay | Admitting: *Deleted

## 2019-10-23 DIAGNOSIS — N921 Excessive and frequent menstruation with irregular cycle: Secondary | ICD-10-CM

## 2019-10-23 NOTE — Telephone Encounter (Signed)
Patient called stating bleeding persists on the NuvaRing, per note on 07/17/19 schedule pelvic ultrasound if bleeding persists. Patient said when she placed the 2nd ring she had bleeding( not heavy)then when placed the 3rd ring bleeding was heavy, changing 4-5 pads that day. To light bleeding not heavy. Patient is schedule for ultrasound on 11/19/19.

## 2019-11-19 ENCOUNTER — Ambulatory Visit: Payer: PPO | Admitting: Internal Medicine

## 2019-11-19 ENCOUNTER — Ambulatory Visit (INDEPENDENT_AMBULATORY_CARE_PROVIDER_SITE_OTHER): Payer: PPO | Admitting: Obstetrics & Gynecology

## 2019-11-19 ENCOUNTER — Other Ambulatory Visit: Payer: Self-pay

## 2019-11-19 ENCOUNTER — Ambulatory Visit (INDEPENDENT_AMBULATORY_CARE_PROVIDER_SITE_OTHER): Payer: PPO

## 2019-11-19 DIAGNOSIS — N921 Excessive and frequent menstruation with irregular cycle: Secondary | ICD-10-CM

## 2019-11-19 DIAGNOSIS — N9411 Superficial (introital) dyspareunia: Secondary | ICD-10-CM

## 2019-11-19 LAB — CBC
HCT: 36.9 % (ref 35.0–45.0)
Hemoglobin: 12.4 g/dL (ref 11.7–15.5)
MCH: 28.6 pg (ref 27.0–33.0)
MCHC: 33.6 g/dL (ref 32.0–36.0)
MCV: 85.2 fL (ref 80.0–100.0)
MPV: 10.4 fL (ref 7.5–12.5)
Platelets: 229 10*3/uL (ref 140–400)
RBC: 4.33 10*6/uL (ref 3.80–5.10)
RDW: 14 % (ref 11.0–15.0)
WBC: 9.9 10*3/uL (ref 3.8–10.8)

## 2019-11-19 MED ORDER — MEDROXYPROGESTERONE ACETATE 150 MG/ML IM SUSP: 150.0000 mg | INTRAMUSCULAR | 4 refills | Status: DC

## 2019-11-19 MED ORDER — LIDOCAINE HCL URETHRAL/MUCOSAL 2 % EX GEL: 1.0000 "application " | CUTANEOUS | 4 refills | Status: DC | PRN

## 2019-11-19 MED ORDER — MEDROXYPROGESTERONE ACETATE 150 MG/ML IM SUSP
150.0000 mg | Freq: Once | INTRAMUSCULAR | Status: AC
  Administered 2019-11-19: 150 mg via INTRAMUSCULAR

## 2019-11-19 MED ORDER — MEDROXYPROGESTERONE ACETATE 150 MG/ML IM SUSP: 150.0000 mg | Freq: Once | INTRAMUSCULAR | Status: DC

## 2019-11-19 NOTE — Progress Notes (Signed)
    [name] 10/28/1965 [id number]        54 y.o.  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  No pelvic pain.  Rarely sexually active because of the frequent bleeding and also superficial dyspareunia.  Using lubricants.  Requesting Xylocaine gel for IC.   OB History  Gravida Para Term Preterm AB Living  0 0 0 0 0 0  SAB TAB Ectopic Multiple Live Births  0 0 0 0      Past medical history,surgical history, problem list, medications, allergies, family history and social history were all reviewed and documented in the EPIC chart.   Directed ROS with pertinent positives and negatives documented in the history of present illness/assessment and plan.  Exam:  There were no vitals filed for this visit. General appearance:  Normal  Pelvic US today: T/V images.  Small anteverted uterus normal in size and shape with no myometrial mass seen.  The uterus is measured at 6.81 x 3.89 x 2.88 cm.  Thin, symmetrical endometrial lining measured at 1.35 mm.  No endometrial mass or thickening seen and no abnormal blood flow to the endometrium.  Both ovaries are small with atrophic appearance.  No adnexal mass seen.  No free fluid in the posterior cul-de-sac.   Assessment/Plan:  54 y.o. G0P0000   1. Irregular intermenstrual bleeding Pelvic ultrasound findings thoroughly reviewed with patient.  Patient reassured that the endometrial lining is very thin at 1.35 mm with no uterine or endometrial lesion.  Both ovaries are small and normal.  CBC done today to rule out anemia.  Decision to change to Depo-Provera injections to try to control breakthrough bleeding.  Usage, risks and benefits reviewed with patient.  First injection given today. - CBC  2. Superficial dyspareunia Lidocaine 2% jelly prescribed at patient's request.  We will also use lubricant, coconut oil recommended.  Other orders -  medroxyPROGESTERone (DEPO-PROVERA) 150 MG/ML injection; Inject 1 mL (150 mg total) into the muscle every 3 (three) months. - medroxyPROGESTERone (DEPO-PROVERA) injection 150 mg - lidocaine (XYLOCAINE) 2 % jelly; Apply 1 application topically as needed.  Genia Del MD, 10:49 AM 11/19/2019

## 2019-11-20 ENCOUNTER — Telehealth: Payer: Self-pay | Admitting: *Deleted

## 2019-11-20 MED ORDER — LIDOCAINE HCL URETHRAL/MUCOSAL 2 % EX GEL: 1.0000 "application " | CUTANEOUS | 4 refills | Status: DC | PRN

## 2019-11-20 NOTE — Telephone Encounter (Signed)
Patient was seen yesterday and was told Rx for lidocaine gel would be sent to pharmacy. It appears Rx was prescribed, but printed and not called in, it appears Rx cannot be sent electronically. Rx called in.

## 2019-11-24 ENCOUNTER — Encounter: Payer: Self-pay | Admitting: Obstetrics & Gynecology

## 2019-11-24 NOTE — Patient Instructions (Signed)
1. Irregular intermenstrual bleeding Pelvic ultrasound findings thoroughly reviewed with patient.  Patient reassured that the endometrial lining is very thin at 1.35 mm with no uterine or endometrial lesion.  Both ovaries are small and normal.  CBC done today to rule out anemia.  Decision to change to Depo-Provera injections to try to control breakthrough bleeding.  Usage, risks and benefits reviewed with patient.  First injection given today. - CBC  2. Superficial dyspareunia Lidocaine 2% jelly prescribed at patient's request.  We will also use lubricant, coconut oil recommended.  Other orders - medroxyPROGESTERone (DEPO-PROVERA) 150 MG/ML injection; Inject 1 mL (150 mg total) into the muscle every 3 (three) months. - medroxyPROGESTERone (DEPO-PROVERA) injection 150 mg - lidocaine (XYLOCAINE) 2 % jelly; Apply 1 application topically as needed.  Pooja, it was a pleasure seeing you today!

## 2019-11-25 ENCOUNTER — Telehealth: Payer: Self-pay | Admitting: *Deleted

## 2019-11-25 NOTE — Telephone Encounter (Signed)
Patient called was given 1st depo provera injection on OV 11/19/19. Called c/o having PMS and menopausal symptoms, hot flashes at night and headaches daily. Patient said the bleeding is increasing daily, wearing pads changing pad every 3-4 hours. Patient said she had bleeding with Xulane patch, but not like the bleeding with depo provera. Patient said she has Xulane patch at home asked if she could use this with depo provera in her system as well? If not if another estrogen patch could be sent? Any recommendations patient would appreciate. Please advise

## 2019-11-26 NOTE — Telephone Encounter (Signed)
Best way to help in the long run is to advance the next DepoProvera injection to 2 weeks after the 1st on 3/17th or after.  Can use Xulane patch until receives that 2nd injection.

## 2019-11-26 NOTE — Telephone Encounter (Signed)
Patient informed with below, coming on 12/02/19 for 2nd depo injection

## 2019-11-27 ENCOUNTER — Other Ambulatory Visit (HOSPITAL_COMMUNITY): Payer: Self-pay

## 2019-11-27 ENCOUNTER — Telehealth: Payer: Self-pay | Admitting: Internal Medicine

## 2019-11-27 MED ORDER — SERTRALINE HCL 100 MG PO TABS: 200.0000 mg | ORAL_TABLET | Freq: Every day | ORAL | 0 refills | Status: DC

## 2019-11-27 NOTE — Telephone Encounter (Signed)
Patient requests to be called at ph# [phone] re: Patient just picked up her RX for Rybelsus and is not sure when to take it. Patient takes 3 different medications with each that have directions to take first thing in the morning. Patient wants to make sure that she her medications in a way that each medication remains effective. Patient gives permission to leave a detailed message on her voice mail if patient is unable to answer the phone.

## 2019-11-30 ENCOUNTER — Encounter: Payer: Self-pay | Admitting: Internal Medicine

## 2019-11-30 NOTE — Telephone Encounter (Signed)
She needs to take it before breakfast.

## 2019-12-02 ENCOUNTER — Ambulatory Visit: Payer: Self-pay

## 2019-12-02 ENCOUNTER — Other Ambulatory Visit: Payer: Self-pay

## 2019-12-02 MED ORDER — NORELGESTROMIN-ETH ESTRADIOL 150-35 MCG/24HR TD PTWK: MEDICATED_PATCH | TRANSDERMAL | 12 refills | Status: DC

## 2019-12-06 ENCOUNTER — Other Ambulatory Visit: Payer: Self-pay | Admitting: Internal Medicine

## 2019-12-06 DIAGNOSIS — E039 Hypothyroidism, unspecified: Secondary | ICD-10-CM

## 2019-12-07 ENCOUNTER — Other Ambulatory Visit: Payer: Self-pay | Admitting: Internal Medicine

## 2019-12-22 ENCOUNTER — Ambulatory Visit (INDEPENDENT_AMBULATORY_CARE_PROVIDER_SITE_OTHER): Payer: PPO | Admitting: Internal Medicine

## 2019-12-22 ENCOUNTER — Encounter: Payer: Self-pay | Admitting: Internal Medicine

## 2019-12-22 ENCOUNTER — Other Ambulatory Visit: Payer: Self-pay

## 2019-12-22 VITALS — BP 120/70 | HR 78 | Ht 61.0 in | Wt 145.0 lb

## 2019-12-22 DIAGNOSIS — E669 Obesity, unspecified: Secondary | ICD-10-CM

## 2019-12-22 DIAGNOSIS — E039 Hypothyroidism, unspecified: Secondary | ICD-10-CM | POA: Diagnosis not present

## 2019-12-22 DIAGNOSIS — E782 Mixed hyperlipidemia: Secondary | ICD-10-CM | POA: Diagnosis not present

## 2019-12-22 DIAGNOSIS — E1165 Type 2 diabetes mellitus with hyperglycemia: Secondary | ICD-10-CM

## 2019-12-22 DIAGNOSIS — E66811 Obesity, class 1: Secondary | ICD-10-CM | POA: Insufficient documentation

## 2019-12-22 LAB — POCT GLYCOSYLATED HEMOGLOBIN (HGB A1C): Hemoglobin A1C: 11.5 % — AB (ref 4.0–5.6)

## 2019-12-22 MED ORDER — LANTUS SOLOSTAR 100 UNIT/ML ~~LOC~~ SOPN: 12.0000 [IU] | PEN_INJECTOR | Freq: Every day | SUBCUTANEOUS | 11 refills | Status: DC

## 2019-12-22 MED ORDER — INSULIN PEN NEEDLE 32G X 4 MM MISC: 3 refills | Status: DC

## 2019-12-22 MED ORDER — GLIPIZIDE 5 MG PO TABS: ORAL_TABLET | ORAL | 3 refills | Status: DC

## 2019-12-22 NOTE — Addendum Note (Signed)
Addended by: Darliss Ridgel I on: 12/22/2019 01:45 PM   Modules accepted: Orders

## 2019-12-22 NOTE — Addendum Note (Signed)
Addended by: Darliss Ridgel I on: 12/22/2019 04:30 PM   Modules accepted: Orders

## 2019-12-22 NOTE — Progress Notes (Signed)
Patient ID: [name], female   DOB: Apr 29, 1966, 54 y.o.   MRN: [id number]  This visit occurred during the SARS-CoV-2 public health emergency.  Safety protocols were in place, including screening questions prior to the visit, additional usage of staff PPE, and extensive cleaning of exam room while observing appropriate contact time as indicated for disinfecting solutions.   HPI: [name] is a 54 y.o.-year-old female, returning for f/u for DM2, dx 2013, non-insulin-dependent, uncontrolled, without long-term complications and also hypothyroidism. Last visit 5 months ago.  Before last visit, she moved into a new apartment in a safe neighborhood and she started to walk outside with her dog.  She felt better and her HbA1c was also improved.  She relaxed her diet since last visit and start checking blood sugars.  She restarted checking few weeks ago and these were high, in the 300s and 400s and she thought her test strips were expired and she again stopped checking.  However, since then,, she eliminated sodas and icecream.  She checked again yesterday and CBG was 278.  DM2: Reviewed HbA1c levels: Lab Results  Component Value Date   HGBA1C 7.1 (A) 07/20/2019   HGBA1C 7.3 (A) 03/31/2019   HGBA1C 6.6 (A) 07/30/2018   Pt is on a regimen of: - Glipizide 5-10 mg every other day before L and D - Cycloset 2.4 mg daily in am - Rybelsus 7 mg before first meal of the day - she only started this 2 weeks ago! She did not start Welchol 2 tabs 2x a day - added 03/2017 - too expensive On Turmeric, alpha-lipoic acid + 660 mcg biotin per day, Garlicin.  She had various intolerances to different diabetes medications:  Metformin ER >> in the past, she had gastric discomfort and severe diarrhea (loss of bowel control).  However, we restarted this 08/2017 and she is tolerating this well.  Januvia >> tried for 1-2 months >> left chest pain  She was wondering if she could take Invokana, however she had  multiple vaginal yeast infections. She Also had nocturia 3-4 x a night.   She had reactive hypoglycemia symptoms while on Quercetin (was taking it for allergies) >> we stopped this it is known to increase insulin production by the pancreas.  At last visit she was not checking sugars.   - am: 108-171, 295 (had 3 bags of sweets) >> 136, 149  >> 446, 460 - 1h after b'fast: 207 (after banana bread) >> n/c - before lunch:169-170s, 200s (icecream) >> 161, 200 >> n/c - 2h after lunch: 187 >> n/c >> 172 >> n/c >> 278, 344 - bedtime: 142-223 >> 153, 239 >> n/c >> 116-198 >> 352 - nighttime: 160-200s >> n/c >> 262 >>192, 217 >> n/c  She has many food intolerances, - Gluten >> cannot eat bread or pastry.  - raw vegetables, beans, cruciferous vegetables, fruit >> diarrhea.  - No dairy, however, she does eat ice cream - She does not digest fat well after her cholecystectomy in 2011.   Meter: One Touch Mini  - noCAD, last BUN/creatinine was:  Lab Results  Component Value Date   BUN 16 03/31/2019   CREATININE 0.81 03/31/2019  On irbesartan.  -+ HL: Lab Results  Component Value Date   CHOL 405 (H) 03/31/2019   HDL 130.40 03/31/2019   LDLCALC 237 (H) 03/31/2019   LDLDIRECT 205.0 01/24/2016   TRIG 185.0 (H) 03/31/2019   CHOLHDL 3 03/31/2019  Previous cholesterol levels were: 325/197/132/154.  She could not tolerate pravastatin or Livalo due to muscle aches.  She refused a referral to lipid clinic in the past for PCSK9 inhibitors.  At last visit I advised her to start fluvastatin XL.  - last dilated eye exam was in 05/2019: No DR, no macular degeneration.  Dr. Lennox Grumbles.  -She has numbness and tingling in her legs.  She is on the R enantiomer of alpha-lipoic acid.  She also has a history of PCOS- she has seen Dr. Barry Dienes at [facility] in the past - note from 08/26/2012: perimenopause, and at that time she was on Yasmin, subsequently c+hanged to [facility]. She sees Dr. Vernice Jefferson with OB/GYN. She  has PTSD, MDD, insomnia, anxiety, asthma, HTN, GERD, status post cholecystectomy, anemia, transaminitis. She also had increased uterine bleeding (on Necon). She continues seeing an Systems developer. She takes Nettle powder >> helped her with diarrhea, anemia, insomnia. 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 Also, Undecylenic acid and mastic gum, no carbs x 1-2 months.  She has IBS >> better on L- plantarum, S.bulardii - Brevibacillus Laterosporus 20 min before b'fast.   Hypothyroidism  -Diagnosed 02/2018 -Her antithyroid antibodies were not elevated  Reviewed her TFTs: Lab Results  Component Value Date   TSH 2.96 04/01/2019   TSH 1.26 07/30/2018   TSH 10.64 (H) 04/04/2018   TSH 4.80 (H) 03/13/2018   TSH 2.91 01/24/2016   TSH 2.322 02/26/2013   TSH 2.050 08/07/2012   FREET4 0.60 04/01/2019   FREET4 0.84 07/30/2018   FREET4 0.72 04/04/2018   T3FREE 2.6 04/04/2018   Component     Latest Ref Rng & Units 04/04/2018  Thyroglobulin Ab     < or = 1 IU/mL <1  Thyroperoxidase Ab SerPl-aCnc     <9 IU/mL 2   We started levothyroxine 03/2018.  Pt is on levothyroxine 50 mcg daily, taken: - in am - fasting - at least 30 min from b'fast - no Ca, Fe, MVI, PPIs - not on Biotin  No FH of thyroid disease or thyroid cancer. No h/o radiation tx to head or neck.  No seaweed or kelp. No recent contrast studies. No herbal supplements. No Biotin use. No recent steroids use.   Pt denies: - feeling nodules in neck - hoarseness - dysphagia - choking - SOB with lying down  She felt better after starting levothyroxine.  ROS: Constitutional: no weight gain/no weight loss, no fatigue, no subjective hyperthermia, no subjective hypothermia Eyes: no blurry vision, no xerophthalmia ENT: no sore throat, + see HPI Cardiovascular: no CP/no SOB/no palpitations/no leg swelling Respiratory: no cough/no SOB/no wheezing Gastrointestinal: no N/no V/no D/no  C/no acid reflux Musculoskeletal: no muscle aches/no joint aches Skin: no rashes, no hair loss Neurological: no tremors/+ numbness/+ tingling/no dizziness  I reviewed pt's medications, allergies, PMH, social hx, family hx, and changes were documented in the history of present illness. Otherwise, unchanged from my initial visit note.  Past Medical History:  Diagnosis Date  . Abnormal Papanicolaou smear of cervix with positive human papilloma virus (HPV) test 02/2017  . Anxiety   . Asthma   . Depression   . Diabetes mellitus type II   . Food intolerance   . Headache   . High blood pressure   . IBS (irritable bowel syndrome)   . Insomnia   . Migraines   . PCOS (polycystic ovarian syndrome)   . PTSD (post-traumatic stress disorder)   . Seasonal allergies   .  Thyroid disease    Past Surgical History:  Procedure Laterality Date  . CHOLECYSTECTOMY    . GALLBLADDER SURGERY     Social History   Socioeconomic History  . Marital status: Single    Spouse name: Not on file  . Number of children: 0  . Years of education: 48  . Highest education level: Not on file  Occupational History  . Occupation: Long Term Disability    Comment: PTSD  Tobacco Use  . Smoking status: Never Smoker  . Smokeless tobacco: Never Used  Substance and Sexual Activity  . Alcohol use: Not Currently    Alcohol/week: 0.5 standard drinks    Types: 1 Standard drinks or equivalent per week  . Drug use: No  . Sexual activity: Not Currently    Partners: Male    Birth control/protection: Patch    Comment: 1st intercourse- 78, partners- 52, current partner- 89yr  Other Topics Concern  . Not on file  Social History Narrative   Regular exercise: yes, walk   Caffeine use: occasionally tea   Fun: Social group events, walking her dogs, movies   Denies abuse and feels safe where she lives.    Social Determinants of Health   Financial Resource Strain:   . Difficulty of Paying Living Expenses:   Food  Insecurity:   . Worried About RCharity fundraiserin the Last Year:   . RArboriculturistin the Last Year:   Transportation Needs:   . LFilm/video editor(Medical):   .Marland KitchenLack of Transportation (Non-Medical):   Physical Activity:   . Days of Exercise per Week:   . Minutes of Exercise per Session:   Stress:   . Feeling of Stress :   Social Connections:   . Frequency of Communication with Friends and Family:   . Frequency of Social Gatherings with Friends and Family:   . Attends Religious Services:   . Active Member of Clubs or Organizations:   . Attends CArchivistMeetings:   .Marland KitchenMarital Status:   Intimate Partner Violence:   . Fear of Current or Ex-Partner:   . Emotionally Abused:   .Marland KitchenPhysically Abused:   . Sexually Abused:    Current Outpatient Medications on File Prior to Visit  Medication Sig Dispense Refill  . albuterol (VENTOLIN HFA) 108 (90 Base) MCG/ACT inhaler INHALE 2 PUFFS BY MOUTH EVERY 4 TO 6 HOURS AS DIRECTED(RESCUE) 42.5 g 1  . Alpha-Lipoic Acid 600 MG CAPS Take 300 mg by mouth 2 (two) times daily.     .Marland Kitchenatenolol (TENORMIN) 100 MG tablet Take 1 tablet (100 mg total) by mouth 2 (two) times daily. 180 tablet 3  . Blood Glucose Monitoring Suppl (ONE TOUCH ULTRA MINI) w/Device KIT Use to check blood sugar 2 times a day. 1 kit 0  . clobetasol cream (TEMOVATE) 09.56% Apply 1 application topically 2 (two) times daily. 30 g 1  . clonazePAM (KLONOPIN) 0.5 MG tablet Take 0.5 tablets (0.25 mg total) by mouth daily. 15 tablet 0  . CYCLOSET 0.8 MG TABS TAKE 3 TABLETS BY MOUTH DAILY IN THE MORNING 270 tablet 0  . Digestive Enzymes (ENZYME DIGEST PO) Take by mouth.    . fexofenadine (ALLEGRA) 180 MG tablet Take 180 mg by mouth daily.    . fluvastatin XL (LESCOL XL) 80 MG 24 hr tablet Take 1 tablet (80 mg total) by mouth daily. 30 tablet 11  . glipiZIDE (GLUCOTROL) 5 MG tablet TAKE 1 tab  before each meal, 3x a day 270 tablet 3  . glucose blood (ONETOUCH ULTRA) test strip  Use to check blood sugar once a day. 100 strip 11  . irbesartan (AVAPRO) 75 MG tablet Take 1 tablet (75 mg total) by mouth daily. 90 tablet 3  . levothyroxine (SYNTHROID) 50 MCG tablet TAKE 1 TABLET(50 MCG) BY MOUTH DAILY 30 tablet 2  . lidocaine (XYLOCAINE) 2 % jelly Apply 1 application topically as needed. 30 mL 4  . medroxyPROGESTERone (DEPO-PROVERA) 150 MG/ML injection Inject 1 mL (150 mg total) into the muscle every 3 (three) months. 1 mL 4  . methocarbamol (ROBAXIN) 500 MG tablet TAKE 1 TABLET(500 MG) BY MOUTH EVERY 8 HOURS AS NEEDED FOR MUSCLE SPASMS 30 tablet 1  . naphazoline-pheniramine (VISINE-A) 0.025-0.3 % ophthalmic solution Place 1 drop into both eyes 2 (two) times daily.    . naproxen (NAPROSYN) 500 MG tablet Take 1 tablet (500 mg total) by mouth 2 (two) times daily with a meal. 30 tablet 1  . norelgestromin-ethinyl estradiol (ORTHO EVRA) 150-35 MCG/24HR transdermal patch Place patch onto the skin once weekly continuously. 4 patch 12  . OVER THE COUNTER MEDICATION 1 capsule daily. Probiotic renew life    . OVER THE COUNTER MEDICATION     . Semaglutide (RYBELSUS) 7 MG TABS Take 1 tablet by mouth daily. 30 tablet 11  . sertraline (ZOLOFT) 100 MG tablet Take 2 tablets (200 mg total) by mouth daily. 180 tablet 0  . [DISCONTINUED] EFFEXOR XR 150 MG 24 hr capsule 375 mg daily.      Current Facility-Administered Medications on File Prior to Visit  Medication Dose Route Frequency Provider Last Rate Last Admin  . medroxyPROGESTERone (DEPO-PROVERA) injection 150 mg  150 mg Intramuscular Once Princess Bruins, MD       Allergies  Allergen Reactions  . Latex Itching and Swelling  . Fluticasone     Nose bleeds  . Metformin And Related     GI side effects  . Pravastatin     myalgias   Family History  Problem Relation Age of Onset  . Bipolar disorder Mother   . Hypertension Mother   . Diabetes Mother   . Alzheimer's disease Father   . Hypertension Father   . Alzheimer's disease  Maternal Grandmother   . Heart disease Paternal Grandfather     PE: BP 120/70   Pulse 78   Ht 5' 1"  (1.549 m)   Wt 145 lb (65.8 kg)   SpO2 98%   BMI 27.40 kg/m  Body mass index is 27.4 kg/m.  Wt Readings from Last 3 Encounters:  12/22/19 145 lb (65.8 kg)  07/20/19 159 lb (72.1 kg)  07/17/19 156 lb (70.8 kg)   Constitutional: overweight, in NAD Eyes: PERRLA, EOMI, no exophthalmos ENT: moist mucous membranes, no thyromegaly, no cervical lymphadenopathy Cardiovascular: RRR, No MRG Respiratory: CTA B Gastrointestinal: abdomen soft, NT, ND, BS+ Musculoskeletal: no deformities, strength intact in all 4 Skin: moist, warm, no rashes Neurological: no tremor with outstretched hands, DTR normal in all 4  ASSESSMENT: 1. DM2, non-insulin-dependent, fairly well controlled, without long term complications, but with hyperglycemia  2. HL - Very high LDL - high HDL - slightly high TG  3.  Obesity class 1  4.  Hypothyroidism   PLAN:  1. DM2 - Patient with fairly well-controlled diabetes, hindered by binge eating when stressed.  She did see nutrition in the past.  She also has many medication intolerances which limit our capacity to treat  her diabetes.  She could not tolerate Metformin ER in the past.  She is currently on a sulfonylurea, dopamine agonist, and, oral GLP-1 receptor agonist started at last visit.  At last visit, she was not checking sugars and I strongly advised her to start. -At this visit, sugars are much higher after she relaxed her diet.  She was also out of Ozempic, which she was only able to start 2 weeks ago.  For 1 week she was on the lower dose and now on 7 mg daily.  She feels that this is helping, but did not check many sugars lately.  She did improve her diet in the last few weeks and I strongly advised her to stay off sodas and ice cream -Since her sugars are so high, we discussed about adding basal insulin at least provisionally, until her sugars improved.  We  will start Lantus at 12 units and advised her to adjust the dose.  Discussed about correct injection techniques and demonstrated insulin pen use.  We will continue the rest of the regimen for now. - I advised her to:  Patient Instructions  Please continue: - Glipizide 5-10 mg 3x a day before lunch and supper - Cycloset 2.4 mg daily in am - Rybelsus 7 mg daily before breakfast  Please start: - Lantus 12 units at bedtime. Increase by 4 units every 4 days until sugars in am <130 or until you reach 24 units.  When injecting insulin:  Inject in the abdomen  Rotate the injection sites around the belly button  Change needle for each injection  Keep needle in for 10 sec after last unit of insulin in  Keep the insulin in use out of the fridge  Please return in 1.5 months with your sugar log.   - we checked her HbA1c: 11.5% (MUCH higher) - advised to check sugars at different times of the day - 1-2x a day, rotating check times - advised for yearly eye exams >> she is UTD - return to clinic in 1.5 months    2. HL -Reviewed latest lipid panel from last visit: LDL very high, at 237. Lab Results  Component Value Date   CHOL 405 (H) 03/31/2019   HDL 130.40 03/31/2019   LDLCALC 237 (H) 03/31/2019   LDLDIRECT 205.0 01/24/2016   TRIG 185.0 (H) 03/31/2019   CHOLHDL 3 03/31/2019  -She could not tolerate Livalo and pravastatin due to muscle aches.  We initially tried fluvastatin XL and this was not covered by insurance but we were able to restart it at last visit, as this is now covered.   -She declined a referral to the lipid clinic for PCSK9 inhibitors.  3.  Obesity class 1 -Continue Rybelsus which should also help with weight loss -She did lose significant amount of weight since last visit, 14 pounds, but I suspect this is mostly due to glucotoxicity  4.  Hypothyroidism - latest thyroid labs reviewed with pt >> normal: Lab Results  Component Value Date   TSH 2.96 04/01/2019   - she  continues on LT4 50 mcg daily - pt feels good on this dose. - we discussed about taking the thyroid hormone every day, with water, >30 minutes before breakfast, separated by >4 hours from acid reflux medications, calcium, iron, multivitamins. Pt. is taking it correctly. - will check thyroid tests at next visit  Philemon Kingdom, MD PhD [facility]

## 2019-12-22 NOTE — Patient Instructions (Addendum)
Please continue: - Glipizide 5-10 mg 2x a day before lunch and supper - Cycloset 2.4 mg daily in am - Rybelsus 7 mg daily before breakfast  Please start: - Lantus 12 units at bedtime. Increase by 4 units every 4 days until sugars in am <130 or until you reach 24 units.  When injecting insulin:  Inject in the abdomen  Rotate the injection sites around the belly button  Change needle for each injection  Keep needle in for 10 sec after last unit of insulin in  Keep the insulin in use out of the fridge  Please return in 1.5 months with your sugar log.

## 2019-12-29 ENCOUNTER — Encounter: Payer: Self-pay | Admitting: Internal Medicine

## 2020-01-15 DIAGNOSIS — H5211 Myopia, right eye: Secondary | ICD-10-CM | POA: Diagnosis not present

## 2020-01-15 DIAGNOSIS — E119 Type 2 diabetes mellitus without complications: Secondary | ICD-10-CM | POA: Diagnosis not present

## 2020-02-05 ENCOUNTER — Ambulatory Visit: Payer: PPO | Admitting: Internal Medicine

## 2020-02-05 ENCOUNTER — Ambulatory Visit: Payer: PPO

## 2020-02-05 ENCOUNTER — Encounter: Payer: Self-pay | Admitting: Internal Medicine

## 2020-02-05 ENCOUNTER — Other Ambulatory Visit: Payer: Self-pay

## 2020-02-05 VITALS — BP 110/70 | HR 94 | Ht 61.0 in | Wt 142.0 lb

## 2020-02-05 DIAGNOSIS — E039 Hypothyroidism, unspecified: Secondary | ICD-10-CM | POA: Diagnosis not present

## 2020-02-05 DIAGNOSIS — Z794 Long term (current) use of insulin: Secondary | ICD-10-CM

## 2020-02-05 DIAGNOSIS — E1165 Type 2 diabetes mellitus with hyperglycemia: Secondary | ICD-10-CM | POA: Diagnosis not present

## 2020-02-05 DIAGNOSIS — E782 Mixed hyperlipidemia: Secondary | ICD-10-CM | POA: Diagnosis not present

## 2020-02-05 NOTE — Progress Notes (Signed)
Patient ID: [name], female   DOB: 04-08-1966, 54 y.o.   MRN: [id number]  This visit occurred during the SARS-CoV-2 public health emergency.  Safety protocols were in place, including screening questions prior to the visit, additional usage of staff PPE, and extensive cleaning of exam room while observing appropriate contact time as indicated for disinfecting solutions.   HPI: [name] is a 54 y.o.-year-old female, returning for f/u for DM2, dx 2013, insulin-dependent, uncontrolled, without long-term complications and also hypothyroidism. Last visit 1.5 months ago.  At last visit she relaxed her diet and was not checking blood sugars.  Sugars increase significantly and an HbA1c returned very high.  We had to add insulin.  Sugars improved afterwards.  DM2: Reviewed HbA1c levels: Lab Results  Component Value Date   HGBA1C 11.5 (A) 12/22/2019   HGBA1C 7.1 (A) 07/20/2019   HGBA1C 7.3 (A) 03/31/2019   Pt is on a regimen of: - Glipizide 5-10 mg before lunch and dinner - Cycloset 2.4 mg daily in am - Rybelsus 7 mg before first meal of the day  - Lantus 12 units at bedtime - started 12/2019 She did not start Welchol 2 tabs 2x a day - added 03/2017 - too expensive On Turmeric, alpha-lipoic acid + 660 mcg biotin per day, Garlicin.  She had various intolerances to different diabetes medications:  Metformin ER >> in the past, she had gastric discomfort and severe diarrhea (loss of bowel control).  However, we restarted this 08/2017 and she is tolerating this well.  Januvia >> tried for 1-2 months >> left chest pain  She was wondering if she could take Invokana, however she had multiple vaginal yeast infections. She Also had nocturia 3-4 x a night.   She had reactive hypoglycemia symptoms while on Quercetin (was taking it for allergies) >> we stopped this it is known to increase insulin production by the pancreas.  She is checking sugars 1-2 times a day: - am: 108-171, 295  >> 136,  149  >> 446, 460 >> 94, 142-189 - 1h after b'fast: 207 (after banana bread) >> n/c - before lunch:169-170s, 200s (icecream) >> 161, 200 >> n/c - 2h after lunch: 187 >> n/c >> 172 >> n/c >> 278, 344 >> n/c - bedtime: 142-223 >> 153, 239 >> n/c >> 116-198 >> 352 >> 132, 187-266 - nighttime: 160-200s >> n/c >> 262 >>192, 217 >> n/c >> 111-309 (icecream)  She has many food intolerances, - Gluten >> cannot eat bread or pastry.  - raw vegetables, beans, cruciferous vegetables, fruit >> diarrhea.  - No dairy, however, she does eat ice cream - She does not digest fat well after her cholecystectomy in 2011.   Meter: One Touch Mini  -No CKD, last BUN/creatinine was:  Lab Results  Component Value Date   BUN 16 03/31/2019   CREATININE 0.81 03/31/2019  On irbesartan.  -+ HL: Lab Results  Component Value Date   CHOL 405 (H) 03/31/2019   HDL 130.40 03/31/2019   LDLCALC 237 (H) 03/31/2019   LDLDIRECT 205.0 01/24/2016   TRIG 185.0 (H) 03/31/2019   CHOLHDL 3 03/31/2019  Previous cholesterol levels were: 325/197/132/154.   She could not tolerate pravastatin or Livalo due to muscle aches.  She refused a referral to lipid clinic in the past for PCSK9 inhibitors.  I advised her to start fluvastatin in 2020 and she did start, but she forgot about it-currently not taking it.  - last dilated eye exam was in 05/2019:  No DR no macular degeneration.  Dr. Lennox Grumbles.  -She has numbness and tingling in her legs.  She is on the R enantiomer of alpha-lipoic acid.  She also has a history of PCOS- she has seen Dr. Barry Dienes at [facility] in the past - note from 08/26/2012: perimenopause, and at that time she was on Yasmin, subsequently c+hanged to [facility]. She sees Dr. Vernice Jefferson with OB/GYN. She has PTSD, MDD, insomnia, anxiety, asthma, HTN, GERD, status post cholecystectomy, anemia, transaminitis. She also had increased uterine bleeding (on Necon). She continues seeing an Systems developer. She takes Nettle powder  >> helped her with diarrhea, anemia, insomnia. 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 Also, Undecylenic acid and mastic gum, no carbs x 1-2 months.  She has IBS >> better on L- plantarum, S.bulardii - Brevibacillus Laterosporus 20 min before b'fast.   Hypothyroidism  -Diagnosed 02/2018 -Her antithyroid antibodies were not elevated  Reviewed her TFTs: Lab Results  Component Value Date   TSH 2.96 04/01/2019   TSH 1.26 07/30/2018   TSH 10.64 (H) 04/04/2018   TSH 4.80 (H) 03/13/2018   TSH 2.91 01/24/2016   TSH 2.322 02/26/2013   TSH 2.050 08/07/2012   FREET4 0.60 04/01/2019   FREET4 0.84 07/30/2018   FREET4 0.72 04/04/2018   T3FREE 2.6 04/04/2018   Component     Latest Ref Rng & Units 04/04/2018  Thyroglobulin Ab     < or = 1 IU/mL <1  Thyroperoxidase Ab SerPl-aCnc     <9 IU/mL 2   We started levothyroxine 03/2018.  Pt is on levothyroxine 50 mcg daily, taken: - in am - fasting - at least 30 min from b'fast - no Ca, Fe, MVI, PPIs - not on Biotin  No FH of thyroid disease or thyroid cancer. No FH of thyroid cancer. No h/o radiation tx to head or neck.  No herbal supplements. No Biotin use. No recent steroids use.   Pt denies: - feeling nodules in neck - hoarseness - dysphagia - choking - SOB with lying down  ROS: Constitutional: no weight gain/no weight loss, no fatigue, no subjective hyperthermia, no subjective hypothermia Eyes: no blurry vision, no xerophthalmia ENT: no sore throat, + see HPI Cardiovascular: no CP/no SOB/no palpitations/no leg swelling Respiratory: no cough/no SOB/no wheezing Gastrointestinal: no N/no V/no D/no C/no acid reflux Musculoskeletal: no muscle aches/no joint aches Skin: no rashes, no hair loss Neurological: no tremors/+ numbness/+ tingling/no dizziness  I reviewed pt's medications, allergies, PMH, social hx, family hx, and changes were documented in the history of present illness.  Otherwise, unchanged from my initial visit note.  Past Medical History:  Diagnosis Date  . Abnormal Papanicolaou smear of cervix with positive human papilloma virus (HPV) test 02/2017  . Anxiety   . Asthma   . Depression   . Diabetes mellitus type II   . Food intolerance   . Headache   . High blood pressure   . IBS (irritable bowel syndrome)   . Insomnia   . Migraines   . PCOS (polycystic ovarian syndrome)   . PTSD (post-traumatic stress disorder)   . Seasonal allergies   . Thyroid disease    Past Surgical History:  Procedure Laterality Date  . CHOLECYSTECTOMY    . GALLBLADDER SURGERY     Social History   Socioeconomic History  . Marital status: Single    Spouse name: Not on file  . Number of children: 0  . Years of education:  18  . Highest education level: Not on file  Occupational History  . Occupation: Long Term Disability    Comment: PTSD  Tobacco Use  . Smoking status: Never Smoker  . Smokeless tobacco: Never Used  Substance and Sexual Activity  . Alcohol use: Not Currently    Alcohol/week: 0.5 standard drinks    Types: 1 Standard drinks or equivalent per week  . Drug use: No  . Sexual activity: Not Currently    Partners: Male    Birth control/protection: Patch    Comment: 1st intercourse- 62, partners- 10, current partner- 62yr  Other Topics Concern  . Not on file  Social History Narrative   Regular exercise: yes, walk   Caffeine use: occasionally tea   Fun: Social group events, walking her dogs, movies   Denies abuse and feels safe where she lives.    Social Determinants of Health   Financial Resource Strain:   . Difficulty of Paying Living Expenses:   Food Insecurity:   . Worried About RCharity fundraiserin the Last Year:   . RArboriculturistin the Last Year:   Transportation Needs:   . LFilm/video editor(Medical):   .Marland KitchenLack of Transportation (Non-Medical):   Physical Activity:   . Days of Exercise per Week:   . Minutes of Exercise per  Session:   Stress:   . Feeling of Stress :   Social Connections:   . Frequency of Communication with Friends and Family:   . Frequency of Social Gatherings with Friends and Family:   . Attends Religious Services:   . Active Member of Clubs or Organizations:   . Attends CArchivistMeetings:   .Marland KitchenMarital Status:   Intimate Partner Violence:   . Fear of Current or Ex-Partner:   . Emotionally Abused:   .Marland KitchenPhysically Abused:   . Sexually Abused:    Current Outpatient Medications on File Prior to Visit  Medication Sig Dispense Refill  . albuterol (VENTOLIN HFA) 108 (90 Base) MCG/ACT inhaler INHALE 2 PUFFS BY MOUTH EVERY 4 TO 6 HOURS AS DIRECTED(RESCUE) 42.5 g 1  . Alpha-Lipoic Acid 600 MG CAPS Take 300 mg by mouth 2 (two) times daily.     .Marland Kitchenatenolol (TENORMIN) 100 MG tablet Take 1 tablet (100 mg total) by mouth 2 (two) times daily. 180 tablet 3  . Blood Glucose Monitoring Suppl (ONE TOUCH ULTRA MINI) w/Device KIT Use to check blood sugar 2 times a day. 1 kit 0  . clobetasol cream (TEMOVATE) 07.42% Apply 1 application topically 2 (two) times daily. 30 g 1  . clonazePAM (KLONOPIN) 0.5 MG tablet Take 0.5 tablets (0.25 mg total) by mouth daily. 15 tablet 0  . CYCLOSET 0.8 MG TABS TAKE 3 TABLETS BY MOUTH DAILY IN THE MORNING 270 tablet 0  . Digestive Enzymes (ENZYME DIGEST PO) Take by mouth.    . fexofenadine (ALLEGRA) 180 MG tablet Take 180 mg by mouth daily.    . fluvastatin XL (LESCOL XL) 80 MG 24 hr tablet Take 1 tablet (80 mg total) by mouth daily. 30 tablet 11  . glipiZIDE (GLUCOTROL) 5 MG tablet TAKE 1 tab before each meal, 3x a day 270 tablet 3  . glucose blood (ONETOUCH ULTRA) test strip Use to check blood sugar once a day. 100 strip 11  . insulin glargine (LANTUS SOLOSTAR) 100 UNIT/ML Solostar Pen Inject 12-18 Units into the skin at bedtime. 5 pen 11  . Insulin Pen Needle 32G  X 4 MM MISC Use 1x a day 100 each 3  . irbesartan (AVAPRO) 75 MG tablet Take 1 tablet (75 mg total) by  mouth daily. 90 tablet 3  . levothyroxine (SYNTHROID) 50 MCG tablet TAKE 1 TABLET(50 MCG) BY MOUTH DAILY 30 tablet 2  . lidocaine (XYLOCAINE) 2 % jelly Apply 1 application topically as needed. 30 mL 4  . medroxyPROGESTERone (DEPO-PROVERA) 150 MG/ML injection Inject 1 mL (150 mg total) into the muscle every 3 (three) months. 1 mL 4  . methocarbamol (ROBAXIN) 500 MG tablet TAKE 1 TABLET(500 MG) BY MOUTH EVERY 8 HOURS AS NEEDED FOR MUSCLE SPASMS 30 tablet 1  . naphazoline-pheniramine (VISINE-A) 0.025-0.3 % ophthalmic solution Place 1 drop into both eyes 2 (two) times daily.    . naproxen (NAPROSYN) 500 MG tablet Take 1 tablet (500 mg total) by mouth 2 (two) times daily with a meal. 30 tablet 1  . norelgestromin-ethinyl estradiol (ORTHO EVRA) 150-35 MCG/24HR transdermal patch Place patch onto the skin once weekly continuously. 4 patch 12  . OVER THE COUNTER MEDICATION 1 capsule daily. Probiotic renew life    . OVER THE COUNTER MEDICATION     . Semaglutide (RYBELSUS) 7 MG TABS Take 1 tablet by mouth daily. 30 tablet 11  . sertraline (ZOLOFT) 100 MG tablet Take 2 tablets (200 mg total) by mouth daily. 180 tablet 0  . [DISCONTINUED] EFFEXOR XR 150 MG 24 hr capsule 375 mg daily.      Current Facility-Administered Medications on File Prior to Visit  Medication Dose Route Frequency Provider Last Rate Last Admin  . medroxyPROGESTERone (DEPO-PROVERA) injection 150 mg  150 mg Intramuscular Once Princess Bruins, MD       Allergies  Allergen Reactions  . Latex Itching and Swelling  . Fluticasone     Nose bleeds  . Metformin And Related     GI side effects  . Pravastatin     myalgias   Family History  Problem Relation Age of Onset  . Bipolar disorder Mother   . Hypertension Mother   . Diabetes Mother   . Alzheimer's disease Father   . Hypertension Father   . Alzheimer's disease Maternal Grandmother   . Heart disease Paternal Grandfather     PE: BP 110/70   Pulse 94   Ht 5' 1"  (1.549 m)    Wt 142 lb (64.4 kg)   SpO2 95%   BMI 26.83 kg/m  Body mass index is 26.83 kg/m.  Wt Readings from Last 3 Encounters:  02/05/20 142 lb (64.4 kg)  12/22/19 145 lb (65.8 kg)  07/20/19 159 lb (72.1 kg)   Constitutional: overweight, in NAD Eyes: PERRLA, EOMI, no exophthalmos ENT: moist mucous membranes, no thyromegaly, no cervical lymphadenopathy Cardiovascular: RRR, No MRG Respiratory: CTA B Gastrointestinal: abdomen soft, NT, ND, BS+ Musculoskeletal: no deformities, strength intact in all 4 Skin: moist, warm, no rashes Neurological: no tremor with outstretched hands, DTR normal in all 4  ASSESSMENT: 1. DM2, insulin-dependent, fairly well controlled, without long term complications, but with hyperglycemia  2. HL - Very high LDL - high HDL - slightly high TG  3.  Hypothyroidism  PLAN:  1. DM2 - Patient with previously well-controlled diabetes, hindered by binge eating when stressed.  She did see nutrition in the past.  She also has many medication intolerances which limit our capacity to treat her diabetes.  She could not tolerate Metformin ER in the past.  She is currently on a sulfonylurea, dopamine agonist and oral GLP-1  receptor agonist, to which we added long-acting insulin at last visit as her sugars increase significantly after she relaxed her diet.  At last visit, she did not have any sugars checked but whenever she checked, they were in the 300s to 400s. -At this visit, sugars improved significantly but still a significant percentage of them are above target, including in the morning.  She had hyperglycemic spikes after dietary indiscretions (ice cream) and we discussed about possibly increasing Rybelsus dose, but she has some nausea even with this dose so for now we will not do this.  I did advise her to try to increase Lantus by 2 units for now.   -At next visit, we will need to check her for type 1 diabetes - I advised her to:  Patient Instructions  Please continue: -  Glipizide 5-10 mg 3x a day before lunch and supper - Cycloset 2.4 mg daily in am - Rybelsus 7 mg daily before breakfast  Please increase: - Lantus 14 units at bedtime  Please return in 2-3 months with your sugar log.   - advised to check sugars at different times of the day - 1x a day, rotating check times - advised for yearly eye exams >> she is UTD - return to clinic in 2 months    2. HL -Reviewed latest lipid panel from last visit: LDL very high, at 237. Lab Results  Component Value Date   CHOL 405 (H) 03/31/2019   HDL 130.40 03/31/2019   LDLCALC 237 (H) 03/31/2019   LDLDIRECT 205.0 01/24/2016   TRIG 185.0 (H) 03/31/2019   CHOLHDL 3 03/31/2019  -She could not tolerate Livalo and pravastatin.  We initially tried fluvastatin XL and this was not covered by insurance but we were able to be starting at last visit as this is now covered.  She started this but forgot to take it recently.  I advised her to restart and we will recheck her lipid panel at next visit -She declined a referral to the lipid clinic for PCSK9 inhibitors.  3.   Hypothyroidism - latest thyroid labs reviewed with pt >> normal: Lab Results  Component Value Date   TSH 2.96 04/01/2019   - she continues on LT4 50 mcg daily - pt feels good on this dose. - we discussed about taking the thyroid hormone every day, with water, >30 minutes before breakfast, separated by >4 hours from acid reflux medications, calcium, iron, multivitamins. Pt. is taking it correctly. - will check thyroid tests at next visit   Philemon Kingdom, MD PhD [facility] Endocr[facility]

## 2020-02-05 NOTE — Patient Instructions (Addendum)
Please continue: - Glipizide 5-10 mg 3x a day before lunch and supper - Cycloset 2.4 mg daily in am - Rybelsus 7 mg daily before breakfast  Please increase: - Lantus 14 units at bedtime  Please return in 2-3 months with your sugar log.

## 2020-02-21 ENCOUNTER — Other Ambulatory Visit (HOSPITAL_COMMUNITY): Payer: Self-pay | Admitting: Psychiatry

## 2020-03-04 ENCOUNTER — Telehealth (INDEPENDENT_AMBULATORY_CARE_PROVIDER_SITE_OTHER): Payer: PPO | Admitting: Psychiatry

## 2020-03-04 ENCOUNTER — Encounter (HOSPITAL_COMMUNITY): Payer: Self-pay | Admitting: Psychiatry

## 2020-03-04 DIAGNOSIS — F411 Generalized anxiety disorder: Secondary | ICD-10-CM | POA: Diagnosis not present

## 2020-03-04 DIAGNOSIS — F3341 Major depressive disorder, recurrent, in partial remission: Secondary | ICD-10-CM | POA: Diagnosis not present

## 2020-03-04 DIAGNOSIS — F4310 Post-traumatic stress disorder, unspecified: Secondary | ICD-10-CM | POA: Diagnosis not present

## 2020-03-04 NOTE — Progress Notes (Signed)
Patient ID: [name], female   DOB: 05/28/1966, 54 y.o.   MRN: [id number]   [place] Follow-up Outpatient Visit  [name] 1966-07-11 [id number] 54 y.o.  03/04/2020 10:36 AM  Patient location : home Provider location: home doing virtual  I connected with [name] on 03/04/20 at 10:30 AM EDT by telephone and verified that I am speaking with the correct person using two identifiers.  I discussed the limitations, risks, security and privacy concerns of performing an evaluation and management service by telephone and the availability of in person appointments. I also discussed with the patient that there may be a patient responsible charge related to this service. The patient expressed understanding and agreed to proceed.  Chief Complaint: follow up depression, anxiety  HPI Comments: Mrs. Claytor is  a 54  y/o female with a past psychiatric history significant for Post traumatic stress Disorder, Major Depressive Disorder, recurrent, severe and GAD.  Doing fair, moving to a better neighbourhood has helped Husband lives with mom has parkinsonism, she helps them out as well     tolerating zoloft Modifying factor: friends  Has started doing Yoga helps     Past Medical Family, Social History:  Past Medical History:  Diagnosis Date  . Abnormal Papanicolaou smear of cervix with positive human papilloma virus (HPV) test 02/2017  . Anxiety   . Asthma   . Depression   . Diabetes mellitus type II   . Food intolerance   . Headache   . High blood pressure   . IBS (irritable bowel syndrome)   . Insomnia   . Migraines   . PCOS (polycystic ovarian syndrome)   . PTSD (post-traumatic stress disorder)   . Seasonal allergies   . Thyroid disease    Family History  Problem Relation Age of Onset  . Bipolar disorder Mother   . Hypertension Mother   . Diabetes Mother   . Alzheimer's disease Father   . Hypertension Father   . Alzheimer's disease Maternal  Grandmother   . Heart disease Paternal Grandfather    Social History   Socioeconomic History  . Marital status: Single    Spouse name: Not on file  . Number of children: 0  . Years of education: 80  . Highest education level: Not on file  Occupational History  . Occupation: Long Term Disability    Comment: PTSD  Tobacco Use  . Smoking status: Never Smoker  . Smokeless tobacco: Never Used  Vaping Use  . Vaping Use: Never used  Substance and Sexual Activity  . Alcohol use: Not Currently    Alcohol/week: 0.5 standard drinks    Types: 1 Standard drinks or equivalent per week  . Drug use: No  . Sexual activity: Not Currently    Partners: Male    Birth control/protection: Patch    Comment: 1st intercourse- 57, partners- 29, current partner- 90yr  Other Topics Concern  . Not on file  Social History Narrative   Regular exercise: yes, walk   Caffeine use: occasionally tea   Fun: Social group events, walking her dogs, movies   Denies abuse and feels safe where she lives.    Social Determinants of Health   Financial Resource Strain:   . Difficulty of Paying Living Expenses:   Food Insecurity:   . Worried About RCharity fundraiserin the Last Year:   . RDunmorin the Last Year:   Transportation Needs:   . Lack of  Transportation (Medical):   Marland Kitchen Lack of Transportation (Non-Medical):   Physical Activity:   . Days of Exercise per Week:   . Minutes of Exercise per Session:   Stress:   . Feeling of Stress :   Social Connections:   . Frequency of Communication with Friends and Family:   . Frequency of Social Gatherings with Friends and Family:   . Attends Religious Services:   . Active Member of Clubs or Organizations:   . Attends Archivist Meetings:   Marland Kitchen Marital Status:   Intimate Partner Violence:   . Fear of Current or Ex-Partner:   . Emotionally Abused:   Marland Kitchen Physically Abused:   . Sexually Abused:     Outpatient Encounter Medications as of 03/04/2020   Medication Sig  . albuterol (VENTOLIN HFA) 108 (90 Base) MCG/ACT inhaler INHALE 2 PUFFS BY MOUTH EVERY 4 TO 6 HOURS AS DIRECTED(RESCUE)  . Alpha-Lipoic Acid 600 MG CAPS Take 300 mg by mouth 2 (two) times daily.   Marland Kitchen atenolol (TENORMIN) 100 MG tablet Take 1 tablet (100 mg total) by mouth 2 (two) times daily.  . Blood Glucose Monitoring Suppl (ONE TOUCH ULTRA MINI) w/Device KIT Use to check blood sugar 2 times a day.  . clobetasol cream (TEMOVATE) 2.87 % Apply 1 application topically 2 (two) times daily.  . clonazePAM (KLONOPIN) 0.5 MG tablet Take 0.5 tablets (0.25 mg total) by mouth daily.  . CYCLOSET 0.8 MG TABS TAKE 3 TABLETS BY MOUTH DAILY IN THE MORNING  . Digestive Enzymes (ENZYME DIGEST PO) Take by mouth.  . fexofenadine (ALLEGRA) 180 MG tablet Take 180 mg by mouth daily.  . fluvastatin XL (LESCOL XL) 80 MG 24 hr tablet Take 1 tablet (80 mg total) by mouth daily.  Marland Kitchen glipiZIDE (GLUCOTROL) 5 MG tablet TAKE 1 tab before each meal, 3x a day  . glucose blood (ONETOUCH ULTRA) test strip Use to check blood sugar once a day.  . insulin glargine (LANTUS SOLOSTAR) 100 UNIT/ML Solostar Pen Inject 12-18 Units into the skin at bedtime.  . Insulin Pen Needle 32G X 4 MM MISC Use 1x a day  . irbesartan (AVAPRO) 75 MG tablet Take 1 tablet (75 mg total) by mouth daily.  Marland Kitchen levothyroxine (SYNTHROID) 50 MCG tablet TAKE 1 TABLET(50 MCG) BY MOUTH DAILY  . lidocaine (XYLOCAINE) 2 % jelly Apply 1 application topically as needed.  . medroxyPROGESTERone (DEPO-PROVERA) 150 MG/ML injection Inject 1 mL (150 mg total) into the muscle every 3 (three) months.  . methocarbamol (ROBAXIN) 500 MG tablet TAKE 1 TABLET(500 MG) BY MOUTH EVERY 8 HOURS AS NEEDED FOR MUSCLE SPASMS  . naphazoline-pheniramine (VISINE-A) 0.025-0.3 % ophthalmic solution Place 1 drop into both eyes 2 (two) times daily.  . naproxen (NAPROSYN) 500 MG tablet Take 1 tablet (500 mg total) by mouth 2 (two) times daily with a meal.  . norelgestromin-ethinyl  estradiol (ORTHO EVRA) 150-35 MCG/24HR transdermal patch Place patch onto the skin once weekly continuously.  Marland Kitchen OVER THE COUNTER MEDICATION 1 capsule daily. Probiotic renew life  . OVER THE COUNTER MEDICATION   . Semaglutide (RYBELSUS) 7 MG TABS Take 1 tablet by mouth daily.  . sertraline (ZOLOFT) 100 MG tablet TAKE 2 TABLETS(200 MG) BY MOUTH DAILY  . [DISCONTINUED] EFFEXOR XR 150 MG 24 hr capsule 375 mg daily.    Facility-Administered Encounter Medications as of 03/04/2020  Medication  . medroxyPROGESTERone (DEPO-PROVERA) injection 150 mg     Physical Exam: Constitutional:  There were no vitals taken for  this visit.  Review of Systems  Cardiovascular: Negative for chest pain.  Psychiatric/Behavioral: Negative for depression and substance abuse.       Psychiatric Specialty exam: General Appearance:   Eye Contact::    Speech:  Clear and Coherent and Normal Rate  Volume:  Normal  Mood: fair  Affect:    Thought Process:  Coherent, Linear and Logical  Orientation:  Full (Time, Place, and Person)  Thought Content:  WDL  Suicidal Thoughts:  No  Homicidal Thoughts:  No  Memory:  Immediate;   Good Recent;   Good Remote;   Good  Judgement:  Fair  Insight:  Fair  Psychomotor Activity:  Normal  Concentration:  Good  Recall:  Good  Akathisia:  Negative  Language-Intact  Fund of knowledge-Average  Handed:  Right  AIMS (if indicated):     Assets:  Desire for Improvement Housing  Sleep:  Number of Hours: 1-9 hours     Assessment: Axis I: Post traumatic stress Disorder-Major Depressive Disorder, recurrent, severe- GAd    Plan of Care:  PLAN:   Affirm with the patient that the medications are taken as ordered. Patient  expressed understanding of how their medications were to be used.    Laboratory:  No labs warranted at this time.   Psychotherapy: Therapy: brief supportive therapy provided.  Discussed psychosocial stressors in detail. More than 50% of the visit was  spent on individual therapy/counseling.   Medications:  Continue  the following psychiatric medications as written prior to this appointment with the following changes::  1. Depression: stable. Continue zoloft   2. PTSD:baseline, continue zoloft 3. GAD:  Fluctuates but manageable, continue zoloft,, Has taken klonopine prn  I discussed the assessment and treatment plan with the patient. The patient was provided an opportunity to ask questions and all were answered. The patient agreed with the plan and demonstrated an understanding of the instructions.   The patient was advised to call back or seek an in-person evaluation if the symptoms worsen or if the condition fails to improve as anticipated.  Wants to come in 5-78m  Can call earlier for concerns and for refill  Time spent non face to face 124m          NAFreddy JakschM.D.  03/04/2020 10:36 AM

## 2020-03-07 ENCOUNTER — Other Ambulatory Visit: Payer: Self-pay | Admitting: Internal Medicine

## 2020-03-07 DIAGNOSIS — E039 Hypothyroidism, unspecified: Secondary | ICD-10-CM

## 2020-03-10 ENCOUNTER — Other Ambulatory Visit: Payer: Self-pay | Admitting: Internal Medicine

## 2020-03-15 ENCOUNTER — Ambulatory Visit (INDEPENDENT_AMBULATORY_CARE_PROVIDER_SITE_OTHER): Payer: PPO | Admitting: Internal Medicine

## 2020-03-15 DIAGNOSIS — E1165 Type 2 diabetes mellitus with hyperglycemia: Secondary | ICD-10-CM

## 2020-03-15 DIAGNOSIS — E039 Hypothyroidism, unspecified: Secondary | ICD-10-CM | POA: Diagnosis not present

## 2020-03-15 DIAGNOSIS — Z794 Long term (current) use of insulin: Secondary | ICD-10-CM

## 2020-03-15 DIAGNOSIS — E782 Mixed hyperlipidemia: Secondary | ICD-10-CM

## 2020-03-15 NOTE — Patient Instructions (Addendum)
Please continue: - Glipizide10 mg 2x a day before lunch and supper - Cycloset 2.4 mg daily in am  Stay at: - Lantus 20 units at bedtime  Please use: - NovoLog 8-10 units before a snack at night  Please return in 3 months with your sugar log.

## 2020-03-15 NOTE — Progress Notes (Addendum)
Patient ID: [name], female   DOB: 1965/12/19, 54 y.o.   MRN: [id number]  VISIT ACTUALLY TOOK PLACE on 03/22/2020. Precharted 03/15/2020.  This visit occurred during the SARS-CoV-2 public health emergency.  Safety protocols were in place, including screening questions prior to the visit, additional usage of staff PPE, and extensive cleaning of exam room while observing appropriate contact time as indicated for disinfecting solutions.   HPI: [name] is a 54 y.o.-year-old female, returning for f/u for DM2, dx 2013, insulin-dependent, uncontrolled, without long-term complications and also hypothyroidism. Last visit 1.5 month ago.  DM2: Reviewed HbA1c levels: Lab Results  Component Value Date   HGBA1C 11.5 (A) 12/22/2019   HGBA1C 7.1 (A) 07/20/2019   HGBA1C 7.3 (A) 03/31/2019   Pt is on a regimen of: - Glipizide 10 mg before lunch and dinner - Cycloset 2.4 mg daily in am - Lantus 12 >> 14 >> 14-20 units at bedtime - started 12/2019 She stopped Rybelsus 7 mg >> stopped 2/2 N and D >> diarrhea stopped She did not start Welchol 2 tabs 2x a day - added 03/2017 - too expensive On Turmeric, alpha-lipoic acid + 660 mcg biotin per day, Garlicin.  She had various intolerances to different diabetes medications:  Metformin ER >> in the past, she had gastric discomfort and severe diarrhea (loss of bowel control).  However, we restarted this 08/2017 and she is tolerating this well.  Januvia >> tried for 1-2 months >> left chest pain  She was wondering if she could take Invokana, however she had multiple vaginal yeast infections. She Also had nocturia 3-4 x a night.   She had reactive hypoglycemia symptoms while on Quercetin (was taking it for allergies) >> we stopped this it is known to increase insulin production by the pancreas.  She is checking sugars 1-2 times a day: - am: 136, 149  >> 446, 460 >> 94, 142-189 >> 133, 155, 209 - 1h after b'fast: 207 (after banana bread) >> n/c -  before lunch:169-170s, 200s >> 161, 200 >> n/c >> 236 - 2h after lunch: 172 >> n/c >> 278, 344 >> n/c - bedtime: 116-198 >> 352 >> 132, 187-266 >> 109-190 - nighttime: 192, 217 >> n/c >> 111-309 (icecream) >> 181-317, 358  She has many food intolerances: - Gluten >> cannot eat bread or pastry.  - raw vegetables, beans, cruciferous vegetables, fruit >> diarrhea.  - No dairy, however, she does eat ice cream, cereals  -at night She does not digest fat well after her cholecystectomy in 2011.   Meter: One Touch Mini  -No CKD, last BUN/creatinine was:  Lab Results  Component Value Date   BUN 16 03/31/2019   CREATININE 0.81 03/31/2019  On irbesartan.  -+ HL: Lab Results  Component Value Date   CHOL 405 (H) 03/31/2019   HDL 130.40 03/31/2019   LDLCALC 237 (H) 03/31/2019   LDLDIRECT 205.0 01/24/2016   TRIG 185.0 (H) 03/31/2019   CHOLHDL 3 03/31/2019  Previous cholesterol levels were: 325/197/132/154.   She could not tolerate pravastatin or Livalo due to muscle aches.  She refused a referral to lipid clinic in the past for PCSK9 inhibitors.  She did start fluvastatin XL in 2020 but she forgot to take it before last visit.  I strongly advised her to restart.  She did restart approximately 1 month ago.  She tolerates this well.  - last dilated eye exam was in 12/2019: No DR, no macular degeneration.  Dr. Lennox Grumbles.  -+  Numbness and tingling in her legs.  She is on the R enantiomer of alpha-lipoic acid.  She also has a history of PCOS- she has seen Dr. Barry Dienes at [facility] in the past - note from 08/26/2012: perimenopause, and at that time she was on Yasmin, subsequently c+hanged to [facility]. She sees Dr. Vernice Jefferson with OB/GYN. She has PTSD, MDD, insomnia, anxiety, asthma, HTN, GERD, status post cholecystectomy, anemia, transaminitis. She also had increased uterine bleeding (on Necon). She continues seeing an Systems developer. She takes Nettle powder >> helped her with diarrhea, anemia,  insomnia. 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 Also, Undecylenic acid and mastic gum, no carbs x 1-2 months.  She has IBS >> better on L- plantarum, S.bulardii - Brevibacillus Laterosporus 20 min before b'fast.   Hypothyroidism  -Diagnosed 02/2018. We started levothyroxine 03/2018.  Pt is on levothyroxine 50 mcg daily, taken: - in am - fasting - at least 30 min from b'fast - no Ca, Fe, MVI, PPIs - not on Biotin  Reviewed her TFTs: Lab Results  Component Value Date   TSH 2.96 04/01/2019   TSH 1.26 07/30/2018   TSH 10.64 (H) 04/04/2018   TSH 4.80 (H) 03/13/2018   TSH 2.91 01/24/2016   TSH 2.322 02/26/2013   TSH 2.050 08/07/2012   FREET4 0.60 04/01/2019   FREET4 0.84 07/30/2018   FREET4 0.72 04/04/2018   T3FREE 2.6 04/04/2018   Her antithyroid antibodies were not elevated: Component     Latest Ref Rng & Units 04/04/2018  Thyroglobulin Ab     < or = 1 IU/mL <1  Thyroperoxidase Ab SerPl-aCnc     <9 IU/mL 2   No FH of thyroid disease or thyroid cancer. No FH of thyroid cancer. No h/o radiation tx to head or neck.  No seaweed or kelp. No recent contrast studies. No herbal supplements. No Biotin use. No recent steroids use.   Pt denies: - feeling nodules in neck - hoarseness - dysphagia - choking - SOB with lying down  ROS: Constitutional: no weight gain/no weight loss, no fatigue, no subjective hyperthermia, no subjective hypothermia Eyes: no blurry vision, no xerophthalmia ENT: no sore throat, + see HPI Cardiovascular: no CP/no SOB/no palpitations/no leg swelling Respiratory: no cough/no SOB/no wheezing Gastrointestinal: no N/no V/no D/no C/no acid reflux Musculoskeletal: no muscle aches/no joint aches Skin: no rashes, no hair loss Neurological: no tremors/+ numbness/+ tingling/no dizziness  I reviewed pt's medications, allergies, PMH, social hx, family hx, and changes were documented in the history of present  illness. Otherwise, unchanged from my initial visit note.  Past Medical History:  Diagnosis Date  . Abnormal Papanicolaou smear of cervix with positive human papilloma virus (HPV) test 02/2017  . Anxiety   . Asthma   . Depression   . Diabetes mellitus type II   . Food intolerance   . Headache   . High blood pressure   . IBS (irritable bowel syndrome)   . Insomnia   . Migraines   . PCOS (polycystic ovarian syndrome)   . PTSD (post-traumatic stress disorder)   . Seasonal allergies   . Thyroid disease    Past Surgical History:  Procedure Laterality Date  . CHOLECYSTECTOMY    . GALLBLADDER SURGERY     Social History   Socioeconomic History  . Marital status: Single    Spouse name: Not on file  . Number of children: 0  . Years of education: 9  . Highest  education level: Not on file  Occupational History  . Occupation: Long Term Disability    Comment: PTSD  Tobacco Use  . Smoking status: Never Smoker  . Smokeless tobacco: Never Used  Vaping Use  . Vaping Use: Never used  Substance and Sexual Activity  . Alcohol use: Not Currently    Alcohol/week: 0.5 standard drinks    Types: 1 Standard drinks or equivalent per week  . Drug use: No  . Sexual activity: Not Currently    Partners: Male    Birth control/protection: Patch    Comment: 1st intercourse- 30, partners- 21, current partner- 67yr  Other Topics Concern  . Not on file  Social History Narrative   Regular exercise: yes, walk   Caffeine use: occasionally tea   Fun: Social group events, walking her dogs, movies   Denies abuse and feels safe where she lives.    Social Determinants of Health   Financial Resource Strain:   . Difficulty of Paying Living Expenses:   Food Insecurity:   . Worried About RCharity fundraiserin the Last Year:   . RArboriculturistin the Last Year:   Transportation Needs:   . LFilm/video editor(Medical):   .Marland KitchenLack of Transportation (Non-Medical):   Physical Activity:   . Days of  Exercise per Week:   . Minutes of Exercise per Session:   Stress:   . Feeling of Stress :   Social Connections:   . Frequency of Communication with Friends and Family:   . Frequency of Social Gatherings with Friends and Family:   . Attends Religious Services:   . Active Member of Clubs or Organizations:   . Attends CArchivistMeetings:   .Marland KitchenMarital Status:   Intimate Partner Violence:   . Fear of Current or Ex-Partner:   . Emotionally Abused:   .Marland KitchenPhysically Abused:   . Sexually Abused:    Current Outpatient Medications on File Prior to Visit  Medication Sig Dispense Refill  . albuterol (VENTOLIN HFA) 108 (90 Base) MCG/ACT inhaler INHALE 2 PUFFS BY MOUTH EVERY 4 TO 6 HOURS AS DIRECTED(RESCUE) 42.5 g 1  . Alpha-Lipoic Acid 600 MG CAPS Take 300 mg by mouth 2 (two) times daily.     .Marland Kitchenatenolol (TENORMIN) 100 MG tablet Take 1 tablet (100 mg total) by mouth 2 (two) times daily. 180 tablet 3  . Blood Glucose Monitoring Suppl (ONE TOUCH ULTRA MINI) w/Device KIT Use to check blood sugar 2 times a day. 1 kit 0  . clobetasol cream (TEMOVATE) 08.46% Apply 1 application topically 2 (two) times daily. 30 g 1  . clonazePAM (KLONOPIN) 0.5 MG tablet Take 0.5 tablets (0.25 mg total) by mouth daily. 15 tablet 0  . CYCLOSET 0.8 MG TABS TAKE 3 TABLETS BY MOUTH DAILY IN THE MORNING 270 tablet 0  . Digestive Enzymes (ENZYME DIGEST PO) Take by mouth.    . fexofenadine (ALLEGRA) 180 MG tablet Take 180 mg by mouth daily.    . fluvastatin XL (LESCOL XL) 80 MG 24 hr tablet Take 1 tablet (80 mg total) by mouth daily. 30 tablet 11  . glipiZIDE (GLUCOTROL) 5 MG tablet TAKE 1 tab before each meal, 3x a day 270 tablet 3  . glucose blood (ONETOUCH ULTRA) test strip Use to check blood sugar once a day. 100 strip 11  . insulin glargine (LANTUS SOLOSTAR) 100 UNIT/ML Solostar Pen Inject 12-18 Units into the skin at bedtime. 5 pen 11  .  Insulin Pen Needle 32G X 4 MM MISC Use 1x a day 100 each 3  . irbesartan  (AVAPRO) 75 MG tablet Take 1 tablet (75 mg total) by mouth daily. 90 tablet 3  . levothyroxine (SYNTHROID) 50 MCG tablet TAKE 1 TABLET(50 MCG) BY MOUTH DAILY 30 tablet 0  . lidocaine (XYLOCAINE) 2 % jelly Apply 1 application topically as needed. 30 mL 4  . medroxyPROGESTERone (DEPO-PROVERA) 150 MG/ML injection Inject 1 mL (150 mg total) into the muscle every 3 (three) months. 1 mL 4  . methocarbamol (ROBAXIN) 500 MG tablet TAKE 1 TABLET(500 MG) BY MOUTH EVERY 8 HOURS AS NEEDED FOR MUSCLE SPASMS 30 tablet 1  . naphazoline-pheniramine (VISINE-A) 0.025-0.3 % ophthalmic solution Place 1 drop into both eyes 2 (two) times daily.    . naproxen (NAPROSYN) 500 MG tablet Take 1 tablet (500 mg total) by mouth 2 (two) times daily with a meal. 30 tablet 1  . norelgestromin-ethinyl estradiol (ORTHO EVRA) 150-35 MCG/24HR transdermal patch Place patch onto the skin once weekly continuously. 4 patch 12  . OVER THE COUNTER MEDICATION 1 capsule daily. Probiotic renew life    . OVER THE COUNTER MEDICATION     . Semaglutide (RYBELSUS) 7 MG TABS Take 1 tablet by mouth daily. 30 tablet 11  . sertraline (ZOLOFT) 100 MG tablet TAKE 2 TABLETS(200 MG) BY MOUTH DAILY 180 tablet 0  . [DISCONTINUED] EFFEXOR XR 150 MG 24 hr capsule 375 mg daily.      Current Facility-Administered Medications on File Prior to Visit  Medication Dose Route Frequency Provider Last Rate Last Admin  . medroxyPROGESTERone (DEPO-PROVERA) injection 150 mg  150 mg Intramuscular Once Princess Bruins, MD       Allergies  Allergen Reactions  . Latex Itching and Swelling  . Fluticasone     Nose bleeds  . Metformin And Related     GI side effects  . Pravastatin     myalgias   Family History  Problem Relation Age of Onset  . Bipolar disorder Mother   . Hypertension Mother   . Diabetes Mother   . Alzheimer's disease Father   . Hypertension Father   . Alzheimer's disease Maternal Grandmother   . Heart disease Paternal Grandfather      PE: BP 110/80   Pulse 74   Ht 5' 1"  (1.549 m)   Wt 147 lb (66.7 kg)   SpO2 97%   BMI 27.78 kg/m  There were no vitals taken for this visit. There is no height or weight on file to calculate BMI.  Wt Readings from Last 3 Encounters:  02/05/20 142 lb (64.4 kg)  12/22/19 145 lb (65.8 kg)  07/20/19 159 lb (72.1 kg)   Constitutional: overweight, in NAD Eyes: PERRLA, EOMI, no exophthalmos ENT: moist mucous membranes, no thyromegaly, no cervical lymphadenopathy Cardiovascular: RRR, No MRG Respiratory: CTA B Gastrointestinal: abdomen soft, NT, ND, BS+ Musculoskeletal: no deformities, strength intact in all 4 Skin: moist, warm, no rashes Neurological: no tremor with outstretched hands, DTR normal in all 4  ASSESSMENT: 1. DM2, insulin-dependent, fairly well controlled, without long term complications, but with hyperglycemia  2. HL - Very high LDL - high HDL - slightly high TG  3.  Hypothyroidism  PLAN:  1. DM2 - Patient with previously well-controlled diabetes, hindered by binge eating when stressed.  She did see nutrition in the past.  She also has many medication intolerances which limit our capacity to treat her diabetes.  She could not tolerate Metformin ER  in the past.  She is currently on a sulfonylurea, dopamine agonist, oral GLP-1 receptor agonist, and now long-acting insulin, added when her sugars increased to 300s to 400s.  At that time, HbA1c returned extremely elevated, at 11.5% in 12/2019 -At last visit, her sugars did improve but they were still above target so we increased her Lantus dose.  She had some dietary indiscretions (ice cream) we discussed about possibly increasing Rybelsus dose, but she had some nausea so we did not do so.  However, since last visit, she tells me that she stopped Rybelsus completely after diarrhea and the rest of the GI symptoms resolved. -Her sugars did improve on Lantus but she is varying the dose too much, between 14 and 20 units,  depending on the sugars at night.  I advised her to stay with her 20 units.  We reviewed together her nighttime sugars.  This is a time when she stays late and eats starches and other sweets.  Her sugars increased to 300s.  Ideally, she would stop snacking at night - At this visit, we also need to check her for type 1 diabetes, however, sugars right before coming here was in the 230s so we will do this at next visit. - I advised her to:  Patient Instructions  Please continue: - Glipizide10 mg 2x a day before lunch and supper - Cycloset 2.4 mg daily in am  Stay at: - Lantus 20 units at bedtime  Please use: - NovoLog 8-10 units before a snack at night  Please return in 3 months with your sugar log.    - we checked her HbA1c: 6.9% (better) - advised to check sugars at different times of the day - 1-2x a day, rotating check times - advised for yearly eye exams >> she is UTD - return to clinic in 3 months   2. HL -Reviewed latest lipid panel from 03/2019: Extremely high TSH: Lab Results  Component Value Date   CHOL 405 (H) 03/31/2019   HDL 130.40 03/31/2019   LDLCALC 237 (H) 03/31/2019   LDLDIRECT 205.0 01/24/2016   TRIG 185.0 (H) 03/31/2019   CHOLHDL 3 03/31/2019  -She could not tolerate Livalo and pravastatin.  We initially tried fluvastatin XL and this was not covered by insurance but we were able to be starting at last visit as this is now covered.  She started this but before last visit she forgot to take it.  I advised her to restart -she did so approximately 1 month ago. -we will recheck a lipid panel today -Of note, she declined a referral to the lipid clinic for PCSK9 inhibitors.  3.   Hypothyroidism - latest thyroid labs reviewed with pt >> normal: Lab Results  Component Value Date   TSH 2.96 04/01/2019   - she continues on LT4 50 mcg daily - pt feels good on this dose. - we discussed about taking the thyroid hormone every day, with water, >30 minutes before breakfast,  separated by >4 hours from acid reflux medications, calcium, iron, multivitamins. Pt. is taking it correctly. - will check thyroid tests today: TSH and fT4 - If labs are abnormal, she will need to return for repeat TFTs in 1.5 months  Component     Latest Ref Rng & Units 03/22/2020  Sodium     135 - 145 mEq/L 133 (L)  Potassium     3.5 - 5.1 mEq/L 4.7  Chloride     96 - 112 mEq/L 96  CO2  19 - 32 mEq/L 27  Glucose     70 - 99 mg/dL 138 (H)  BUN     6 - 23 mg/dL 16  Creatinine     0.40 - 1.20 mg/dL 0.71  Total Bilirubin     0.2 - 1.2 mg/dL 0.2  Alkaline Phosphatase     39 - 117 U/L 61  AST     0 - 37 U/L 20  ALT     0 - 35 U/L 17  Total Protein     6.0 - 8.3 g/dL 7.3  Albumin     3.5 - 5.2 g/dL 3.8  Calcium     8.4 - 10.5 mg/dL 9.4  GFR     >60.00 mL/min 86.35  Cholesterol     0 - 200 mg/dL 291 (H)  Triglycerides     0 - 149 mg/dL 175.0 (H)  HDL Cholesterol     >39.00 mg/dL 123.90  VLDL     0.0 - 40.0 mg/dL 35.0  LDL (calc)     0 - 99 mg/dL 132 (H)  Total CHOL/HDL Ratio      2  NonHDL      167.49  Microalb, Ur     0.0 - 1.9 mg/dL 5.9 (H)  Creatinine,U     mg/dL 113.1  MICROALB/CREAT RATIO     0.0 - 30.0 mg/g 5.2  TSH     0.35 - 4.50 uIU/mL 6.44 (H)  T4,Free(Direct)     0.60 - 1.60 ng/dL 0.91   We will encourage compliance with fluvastatin XL. Her TSH is high.  We will need to increase the dose to 75 mcg daily.  We will recheck her TFTs at next visit.  Philemon Kingdom, MD PhD [facility] Endocrinology

## 2020-03-22 ENCOUNTER — Encounter: Payer: Self-pay | Admitting: Internal Medicine

## 2020-03-22 ENCOUNTER — Other Ambulatory Visit: Payer: Self-pay

## 2020-03-22 ENCOUNTER — Ambulatory Visit (INDEPENDENT_AMBULATORY_CARE_PROVIDER_SITE_OTHER): Payer: HMO | Admitting: Internal Medicine

## 2020-03-22 VITALS — BP 110/80 | HR 74 | Ht 61.0 in | Wt 147.0 lb

## 2020-03-22 DIAGNOSIS — E1165 Type 2 diabetes mellitus with hyperglycemia: Secondary | ICD-10-CM

## 2020-03-22 DIAGNOSIS — Z5329 Procedure and treatment not carried out because of patient's decision for other reasons: Secondary | ICD-10-CM

## 2020-03-22 DIAGNOSIS — E039 Hypothyroidism, unspecified: Secondary | ICD-10-CM

## 2020-03-22 DIAGNOSIS — E782 Mixed hyperlipidemia: Secondary | ICD-10-CM

## 2020-03-22 DIAGNOSIS — Z794 Long term (current) use of insulin: Secondary | ICD-10-CM

## 2020-03-22 DIAGNOSIS — Z91199 Patient's noncompliance with other medical treatment and regimen due to unspecified reason: Secondary | ICD-10-CM

## 2020-03-22 LAB — POCT GLYCOSYLATED HEMOGLOBIN (HGB A1C): Hemoglobin A1C: 6.9 % — AB (ref 4.0–5.6)

## 2020-03-22 MED ORDER — GLIPIZIDE 5 MG PO TABS: ORAL_TABLET | ORAL | 3 refills | Status: DC

## 2020-03-22 MED ORDER — NOVOLOG FLEXPEN 100 UNIT/ML ~~LOC~~ SOPN: 8.0000 [IU] | PEN_INJECTOR | Freq: Three times a day (TID) | SUBCUTANEOUS | 11 refills | Status: DC

## 2020-03-22 NOTE — Addendum Note (Signed)
Addended by: Darliss Ridgel I on: 03/22/2020 03:03 PM   Modules accepted: Orders

## 2020-03-22 NOTE — Addendum Note (Signed)
Addended by: Adline Mango I on: 03/22/2020 03:42 PM   Modules accepted: Orders

## 2020-03-22 NOTE — Progress Notes (Signed)
NO SHOW on 03/15/2020

## 2020-03-23 LAB — TSH: TSH: 6.44 u[IU]/mL — ABNORMAL HIGH (ref 0.35–4.50)

## 2020-03-23 LAB — COMPREHENSIVE METABOLIC PANEL
ALT: 17 U/L (ref 0–35)
AST: 20 U/L (ref 0–37)
Albumin: 3.8 g/dL (ref 3.5–5.2)
Alkaline Phosphatase: 61 U/L (ref 39–117)
BUN: 16 mg/dL (ref 6–23)
CO2: 27 mEq/L (ref 19–32)
Calcium: 9.4 mg/dL (ref 8.4–10.5)
Chloride: 96 mEq/L (ref 96–112)
Creatinine, Ser: 0.71 mg/dL (ref 0.40–1.20)
GFR: 86.35 mL/min (ref >60.00)
Glucose, Bld: 138 mg/dL — ABNORMAL HIGH (ref 70–99)
Potassium: 4.7 mEq/L (ref 3.5–5.1)
Sodium: 133 mEq/L — ABNORMAL LOW (ref 135–145)
Total Bilirubin: 0.2 mg/dL (ref 0.2–1.2)
Total Protein: 7.3 g/dL (ref 6.0–8.3)

## 2020-03-23 LAB — LIPID PANEL
Cholesterol: 291 mg/dL — ABNORMAL HIGH (ref 0–200)
HDL: 123.9 mg/dL (ref >39.00)
LDL Cholesterol: 132 mg/dL — ABNORMAL HIGH (ref 0–99)
NonHDL: 167.49
Total CHOL/HDL Ratio: 2
Triglycerides: 175 mg/dL — ABNORMAL HIGH (ref 0.0–149.0)
VLDL: 35 mg/dL (ref 0.0–40.0)

## 2020-03-23 LAB — MICROALBUMIN / CREATININE URINE RATIO
Creatinine,U: 113.1 mg/dL
Microalb Creat Ratio: 5.2 mg/g (ref 0.0–30.0)
Microalb, Ur: 5.9 mg/dL — ABNORMAL HIGH (ref 0.0–1.9)

## 2020-03-23 LAB — T4, FREE: Free T4: 0.91 ng/dL (ref 0.60–1.60)

## 2020-03-24 MED ORDER — LEVOTHYROXINE SODIUM 75 MCG PO TABS
75.0000 ug | ORAL_TABLET | Freq: Every day | ORAL | 3 refills | Status: DC
Start: 2020-03-24 — End: 2021-01-24

## 2020-03-24 NOTE — Addendum Note (Signed)
Addended by: Carlus Pavlov on: 03/24/2020 04:04 PM   Modules accepted: Orders

## 2020-04-14 ENCOUNTER — Other Ambulatory Visit: Payer: Self-pay | Admitting: Internal Medicine

## 2020-04-14 DIAGNOSIS — E039 Hypothyroidism, unspecified: Secondary | ICD-10-CM

## 2020-04-15 ENCOUNTER — Other Ambulatory Visit: Payer: Self-pay | Admitting: *Deleted

## 2020-04-15 NOTE — Patient Outreach (Signed)
  [facility]) Care Management Chronic Special Needs Program    04/15/2020  Name: [name], DOB: 14-Jan-1966  MRN: [id number]   [name] is enrolled in a chronic special needs plan for Diabetes. Outreach call to client for initial telephone outreach, no answer to telephone.  PLAN Outreach client within 2-3 weeks  [name] [facility], BSN [facility] RN Care Coordinator, CSNP [phone]

## 2020-04-16 ENCOUNTER — Other Ambulatory Visit: Payer: Self-pay | Admitting: Family

## 2020-04-16 DIAGNOSIS — I10 Essential (primary) hypertension: Secondary | ICD-10-CM

## 2020-04-19 ENCOUNTER — Ambulatory Visit: Payer: HMO | Admitting: *Deleted

## 2020-04-22 ENCOUNTER — Ambulatory Visit: Payer: HMO | Admitting: *Deleted

## 2020-04-27 ENCOUNTER — Other Ambulatory Visit: Payer: Self-pay | Admitting: *Deleted

## 2020-04-27 ENCOUNTER — Encounter: Payer: Self-pay | Admitting: *Deleted

## 2020-04-27 NOTE — Patient Outreach (Addendum)
[facility]) Care Management  04/27/2020  [name] 1966/02/26 [id number]  CSW received a new referral on patient from [name], Chronic Special Needs Program Coordinator with [facility] Care Management, indicating that patient would benefit from social work services and resources to assist with arranging counseling for Anxiety, Depression and Post-Traumatic Stress Disorder.  Mrs. [name] reported that patient is already established with a psychiatrist for administration and management of psychotropic medications, but is requesting counseling services through a psychologist, licensed clinical social worker or Research scientist (physical sciences).  Mrs. [name] indicated that patient prefers to receive text messages, at least for the initial contact.  CSW sent a HIPAA compliant text message to patient's mobile phone today, per her request, in hopes of being able to actually converse with patient by phone, but only received the following response: "I got your text.  Thank you for following my instructions.  Right now my most pressing need is help managing my diabetes.  I am starting a community support network with previous neighbors.  I haven't slept in almost 24 hours, which happens often". CSW replied with, "Please provide me with a time and date that is convenient for me to contact you by phone".  However, CSW did not receive a return response.    CSW will make a second outreach attempt within the next 3-4 business days, on Tuesday, May 03, 2020, around 11:00am, if a return text or phone call is not received from patient in the meantime.  CSW will also mail a Patient Unsuccessful Outreach Letter to patient's home, encouraging patient to contact CSW directly if she is interested in receiving social work services through CSW with [facility], [facility].  CSW will route this note to patient's Primary Care Physician, Dr. Ria Clock, to ensure that she  is aware of CSW's involvement with patient's plan of care.  Danford Bad, BSW, MSW, [name]  Licensed Restaurant manager, [employer] [facility]  Mailing [place] [address] [place], [place] [address] Physical Address-[address], [place], [place] [address] Toll Free Main # [phone] Fax # [phone] Cell # [phone]  Office # [phone] [email or web address]

## 2020-04-27 NOTE — Patient Outreach (Signed)
[facility] Care Management Chronic Special Needs Program  04/27/2020  Name: [name] DOB: October 12, 1965  MRN: [id number]  [name] is enrolled in a chronic special needs plan for Diabetes. Chronic Care Management Coordinator telephoned client to review health risk assessment and to develop individualized care plan.  Introduced the chronic care management program, importance of client participation, and taking their care plan to all provider appointments and inpatient facilities.  Reviewed the transition of care process and possible referral to community care management.  Subjective: Client reports she lives alone, has no relatives close by, relies on friends if needs any assistance, has a boyfriend that can assist also if needed.  Health Risk Assessment completed with client.  Client reports she has had issues controlling blood sugar "but things seem to be on track better and it's related directly to my stress and eating habits"  Client reports she has PTSD and stress and sees a psychiatrist, client requests social worker contact her for possible counseling, resources for PTSD and stress.  Client states she does want health coach services at present but may be interested in the future.  Client reports her CBG readings are downloaded by endocrinologist and she works closely with endocrinologist for changes, updates.  Client states her AIC is 6.8, down from 11.5 and client feels she is making progress.    Goals    .   Acknowledge receipt of Educational psychologist mailed client Advanced Directives packet. RN care manager reviewed importance of having Advanced Directives forms completed. Plan to have Advanced Directives (Living Will and POA) forms notarized and witnessed. RN care manager mailed EMMI education article " Advanced Directives"     .  "To keep stress under control to have good quality of life and be able to manage diabetes" (pt-stated)       RN care manager placed order for social worker for resources, counseling for stress management/ PTSD Please continue to follow up with psychiatrist Take medications as prescribed EMMI education article provided " Post Traumatic Stress Disorder"     .  Client understands the importance of follow-up with providers by attending scheduled visits      Client follows up regularly with health care providers    .  Client will verbalize knowledge of chronic lung disease as evidenced by no ED visits or Inpatient stays related to chronic lung disease       Review [employer] calendar (in the back section) sent in mail for COPD information and action plan. Read the color COPD color zones and know if you are in the "red, yellow or green zones" and be prepared to discuss with RN on each call. Call your provider if you feel you are getting in the "yellow" zone. Call 911 if you are in the "red" zone, having severe shortness of breath, get to a hospital immediately! Know when and how to use rescue inhaler and daily inhalers, along with other medication. Contact provider for any signs and symptoms of mild shortness of breath. Increase exercise only if you are able to do it.  Follow doctor recommendations. EMMI education article provided " Asthma Action Plan" Review and plan to discuss with RN during next telephonic assessment.      .  Client will verbalize knowledge of self management of Hypertension as evidences by BP reading of 140/90 or less; or as defined by provider      Plan to  check blood pressure regularly.  If you do not have a B/P monitor (cuff), one can be provided to you.  Write results in your [employer] calendar (in the back section). Reviewed blood pressure medication from EMR. Take B/P medications as ordered.  Some may cause you to use the bathroom more. Plan to eat low salt and heart healthy meals full of fruits, vegetables, whole grains, lean protein and limit fat  and sugars. Increase activity as tolerated. Reviewed lifestyle modification- smoking cessation, weight control and reducing stress.      Marland Kitchen  HEMOGLOBIN A1C < 7      Congratulations, your AIC is below goal at 6.9 Your last documented [facility] 03/22/20.  Have your [facility] checked every 6 months if you are at goal or every 3 months if you are not at goal. Check blood sugars daily before eating with goal of 80-130.  You can also check 1 1/2 hours after eating with goal of 180 or less. Plan to eat low carbohydrate and low salt meals, watch portion sizes and avoid sugar sweetened drinks.  Discussed carbohydrate control meals. Reviewed signs and symptoms of hyperglycemia (high blood sugar) and hypoglycemia (low blood sugar) and actions to take. Review [employer] calendar (sent in the mail) for diabetes action plan in the back. Reviewed nutrition counseling benefit provided by [employer].   Increase activity only if you are able to do it.  Follow doctor recommendations. EMMI education provided on Diabetes and Diet".  Review and plan to discuss with RN during next telephonic assessment.  Use 24 hour nurse advice line as needed at [phone]      .  Maintain timely refills of diabetic medication as prescribed within the year .      Contact your RN care manager if you have questions about medicines. Medication review completed from EMR information. It is important to take your medications as prescribed. Reviewed use and possible side effects of diabetes medications     .  Obtain annual  Lipid Profile, LDL-C      Per medical record review, Lipid profile completed on 03/22/20 LDL= 132 The goal for LDL is less than 70mg /dl as you are at high risk for complications. Try to avoid saturated fats, trans-fats and eat more fiber. Plan to take statin (cholesterol) medicine as ordered.      .  Obtain Annual Eye (retinal)  Exam       Your last documented eye exam was on 12/2019. Diabetes  can affect your vision.  Plan to have a dilated eye exam every year. Advised client to keep and/ or schedule appointment with eye doctor. Continue to use your eye drops as prescribed.     .  Obtain Annual Foot Exam      Your doctor should check your bare feet at each visit. Diabetes can affect the nerves in your feet, causing decreased feeling or numbness. Check your feet and in-between toes daily for cuts, bruises, redness, blisters or sores.  If you cannot reach them, use a mirror. Wash feet with soap and water, dry feet well especially between toes.  Don't use too much lotion. Wear shoes that are not too tight and don't walk barefoot.      .  Obtain annual screen for micro albuminuria (urine) , nephropathy (kidney problems)      Diabetes can affect your kidneys. It is important for your doctor to check your urine at least once a year  These tests show  how your kidneys are working.     Clydene Pugh Hemoglobin A1C at least 2 times per year      [facility] checked April and July 2021    .  Visit Primary Care Provider or Endocrinologist at least 2 times per year       Client follows up with primary care provider and endocrinologist regularly       Plan:    RN care manager faxed today's note and individualized care plan to primary care provider, mailed individualized care plan to client along with education materials, consent form, 24 hour nurse line magnet, HTA calendar and Advanced directives packet.  Chronic care management coordination will outreach in:  6 months (Tier 2)  Will refer client to:  Social worker   Audrie Gallus Nursing/RN Coord [facility] Case Manager, C-SNP  [phone]

## 2020-04-28 ENCOUNTER — Ambulatory Visit: Payer: Self-pay | Admitting: *Deleted

## 2020-05-03 ENCOUNTER — Encounter: Payer: Self-pay | Admitting: *Deleted

## 2020-05-03 ENCOUNTER — Other Ambulatory Visit: Payer: Self-pay | Admitting: *Deleted
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
## 2020-05-09 ENCOUNTER — Telehealth: Payer: Self-pay | Admitting: Internal Medicine

## 2020-05-09 MED ORDER — FREESTYLE LIBRE 14 DAY SENSOR MISC: 11 refills | Status: DC

## 2020-05-09 MED ORDER — FREESTYLE LIBRE 14 DAY READER DEVI: 0 refills | Status: DC

## 2020-05-09 NOTE — Telephone Encounter (Signed)
Patient called stating she found out with medicare they've changed the requirements for a CGM and would like to possibly have one. Patient ph# [phone]

## 2020-05-09 NOTE — Telephone Encounter (Signed)
RX sent

## 2020-05-09 NOTE — Telephone Encounter (Signed)
Patient just called insurance, and found that the freestyle Josephine Igo is covered and she requests it to be called into [employer] [phone] [place], [place] - [phone] [facility] [address] AT [facility] OF [address] & [facility] Phone:  [phone]  Fax:  [phone]

## 2020-05-10 ENCOUNTER — Other Ambulatory Visit: Payer: Self-pay | Admitting: *Deleted

## 2020-05-10 ENCOUNTER — Other Ambulatory Visit: Payer: Self-pay | Admitting: Family

## 2020-05-10 ENCOUNTER — Other Ambulatory Visit (HOSPITAL_COMMUNITY): Payer: Self-pay | Admitting: Psychiatry
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
## 2020-05-17 ENCOUNTER — Other Ambulatory Visit: Payer: Self-pay | Admitting: *Deleted

## 2020-05-17 ENCOUNTER — Telehealth: Payer: Self-pay | Admitting: Internal Medicine

## 2020-05-17 MED ORDER — INSULIN PEN NEEDLE 32G X 4 MM MISC: 3 refills | Status: DC

## 2020-05-17 NOTE — Patient Outreach (Signed)
[facility]) Care Management  05/17/2020  [name] [birth year] [id number]   CSW was only able to make brief contact with patient today, as patient reported that she was sleeping at the time of CSW's call.  Patient admitted that she was finally getting "some good quality sleep", requesting to terminate the call so that she could return to sleep.  CSW voiced understanding, agreeing to contact patient again next week, on Tuesday, May 24, 2020, around 10:00am.  Patient has been encouraged to contact CSW directly if social work needs arise in the meantime.  Danford Bad, BSW, MSW, [name]  Licensed Restaurant manager, fast food Health System  Mailing [place] [address] [place], [place] [address] Physical Address[address] [place], [place] [address] Toll Free Main # [phone] Fax # [phone] Cell # [phone]  [email or web address]

## 2020-05-17 NOTE — Telephone Encounter (Signed)
Medication Refill Request  Did you call your pharmacy and request this refill first? Yes . If patient has not contacted pharmacy first, instruct them to do so for future refills.  . Remind them that contacting the pharmacy for their refill is the quickest method to get the refill.  . Refill policy also stated that it will take anywhere between 24-72 hours to receive the refill.    Name of medication? Patient requests upped amount of the following (patient states she is using 4 needles per day):  Insulin Pen Needle 32G X 4 MM MISC  Is this a 90 day supply? Yes  Name and location of pharmacy?  [facility] [phone] - [place], [place] - [phone] AT [facility] [address] & [facility] Phone:  [phone]  Fax:  [phone]       . Is the request for diabetes test strips? No . If yes, what brand? N/A

## 2020-05-17 NOTE — Telephone Encounter (Signed)
RX updated and sent. 

## 2020-05-24 ENCOUNTER — Other Ambulatory Visit: Payer: Self-pay | Admitting: *Deleted

## 2020-05-24 NOTE — Patient Outreach (Signed)
[facility]) Care Management  05/24/2020  [name] 1966/01/20 [id number]   CSW made an attempt to try and contact patient today to assess and assist with social work needs and services, as well as to provide counseling and supportive services for symptoms of Depression and Post-Traumatic Stress Disorder; however, patient was unavailable at the time of CSW's call.  CSW left a HIPAA compliant message on voicemail for patient, while awaiting a return call.  CSW will make a second outreach attempt within the next 3-4 business days, if a return call is not received from patient in the meantime.    Danford Bad, BSW, MSW, [name]  Licensed Restaurant manager, fast food Health System  Mailing [place] [address] [place], [place] [address] Physical Address[address], [place], [place] [address] Toll Free Main # [phone] Fax # [phone] Cell # [phone]  [email or web address]

## 2020-05-27 ENCOUNTER — Ambulatory Visit: Payer: Self-pay | Admitting: *Deleted

## 2020-05-28 ENCOUNTER — Other Ambulatory Visit (HOSPITAL_COMMUNITY): Payer: Self-pay | Admitting: Psychiatry

## 2020-05-30 ENCOUNTER — Encounter: Payer: Self-pay | Admitting: *Deleted

## 2020-05-30 ENCOUNTER — Other Ambulatory Visit: Payer: Self-pay | Admitting: *Deleted

## 2020-05-30 NOTE — Patient Outreach (Signed)
[facility]) Care Management  05/30/2020  RAGUEL KOSLOSKI 06-08-1966 [id number]   CSW made a second attempt to try and contact patient today to follow-up regarding social work services and resources, as well as to provide counseling and supportive services for symptoms of Depression and Post-Traumatic Stress Disorder; however, patient was unavailable at the time of CSW's call.  CSW left a HIPAA compliant message on voicemail for patient and is currently awaiting a return call.  CSW will make a third outreach attempt within the next 3-4 business days, if a return call is not received from patient in the meantime.  CSW will also mail a Patient Unsuccessful Outreach Letter to patient's home, encouraging patient to contact CSW directly if she is interested in continuing to receive social work services and resources through CSW with Chief Executive Officer.  Danford Bad, BSW, MSW, [name]  Licensed Restaurant manager, fast food Health System  Mailing [place] [address], [place], [place] [address] Physical Address[address] [place], [place] [address] Toll Free Main # [phone] Fax # [phone] Cell # [phone]  [email or web address]

## 2020-06-03 ENCOUNTER — Other Ambulatory Visit: Payer: Self-pay | Admitting: *Deleted
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
## 2020-06-10 DIAGNOSIS — H2513 Age-related nuclear cataract, bilateral: Secondary | ICD-10-CM | POA: Diagnosis not present

## 2020-06-10 DIAGNOSIS — H25013 Cortical age-related cataract, bilateral: Secondary | ICD-10-CM | POA: Diagnosis not present

## 2020-06-10 DIAGNOSIS — E119 Type 2 diabetes mellitus without complications: Secondary | ICD-10-CM | POA: Diagnosis not present

## 2020-06-10 DIAGNOSIS — H5213 Myopia, bilateral: Secondary | ICD-10-CM | POA: Diagnosis not present

## 2020-06-13 ENCOUNTER — Other Ambulatory Visit: Payer: Self-pay | Admitting: *Deleted

## 2020-06-13 ENCOUNTER — Other Ambulatory Visit: Payer: Self-pay | Admitting: Internal Medicine

## 2020-06-13 NOTE — Patient Outreach (Signed)
[facility]) Care Management  06/13/2020  QUANTA ROBERTSHAW 1966/05/13 [id number]   CSW was able to make contact with patient today to follow-up regarding social work services and resources, as well as to provide counseling and supportive services for symptoms of Anxiety, Depression and PTSD.  Patient was excited to inform CSW that she recently accepted a new roommate into her home, and that she and the gentleman are getting along very well.  Patient went on to explain that she feels much safer just having him around, and that he is a very good conversationalist and great for keeping her company.    Patient reported that she also adopted a new pet, a 52-year-old dog named Lennox Grumbles, that apparently does not get along well with her already 52 year old dog, Tammy.  Patient did not wish to disclose too much personal information with CSW today, as her new roommate was sitting in the same room as her.  Patient admitted to feeling well, taking her medications exactly as prescribed, and journaling her negative thoughts and emotions when she is reminded of various traumatic childhood experiences.  CSW and patient decided to terminate the call just shy of 1 hour, because Lennox Grumbles and Tammy started barking at one another and would not stop for more than 5 minutes.  Patient agreed to contact CSW later in the day, once the dogs settled down.  However, CSW had already agreed with patient to follow-up with her again in two weeks, on Monday, June 27, 2020, around 9:00am, if a return call is not received from patient in the meantime.  Patient was agreeable to this plan and most appreciative of CSW's calls.  Danford Bad, BSW, MSW, [name]  Licensed Restaurant manager, fast food [facility] System  Mailing [place] [address], [place], [place] [address] Physical Address[address], [place], [place] [address] Toll Free Main # [phone] Fax # [phone] Cell #  [phone]  [email or web address]

## 2020-06-16 ENCOUNTER — Encounter: Payer: Self-pay | Admitting: *Deleted

## 2020-06-16 ENCOUNTER — Other Ambulatory Visit: Payer: Self-pay | Admitting: *Deleted

## 2020-06-16 NOTE — Patient Outreach (Signed)
[facility]) Care Management  06/16/2020  [name] 03/31/1966 [id number]   CSW received a return call from patient this morning, indicating that she is actually in a very good place right now and does not feel the need to continue to receive counseling and supportive services through [place].  Patient went on to explain that she is much happier and feels a lot more safe, now that she has a new female roommate.  Patient reported that her new roommate is good for keeping her company and prevents her from being able to isolate, or shut herself off from the rest of the world.  Patient stated that she will continue to receive medication management and psychotherapeutic services through Dr. Merian Capron, Psychiatrist at [facility] at [place].  Patient has a follow-up appointment with Dr. De Nurse scheduled for Friday, September 02, 2020 at 10:30am.  Patient assured CSW that she will continue to take her psychotropic medications exactly as prescribed.  CSW will perform a case closure on patient, as all goals of treatment have been met from social work standpoint and no additional social work needs have been identified at this time.  CSW will notify patient's Chronic Special Needs Plan Coordinator, also with [place] Management, Jacqlyn Larsen of CSW's plans to close patient's case.  CSW will fax an update to patient's Primary Care Physician, Dr. Jodi Mourning to ensure that she is aware of CSW's involvement with patient's plan of care, as well as route a Physician Case Closure Letter.  CSW was able to confirm that patient has the correct contact information for CSW, encouraging patient to contact CSW directly if she changes her mind about continuing to receive social work services, or if additional social work needs arise in the near future.  Patient voiced understanding and was agreeable to this plan.  Nat Christen, BSW, MSW, [name]   Licensed Education officer, [facility]  Mailing [place] [address] [place], [place] [address] Physical Address-[address] [place], [place] [address] Toll Free Main # [phone] Fax # [phone] Cell # [phone]  [email or web address]

## 2020-06-27 ENCOUNTER — Ambulatory Visit: Payer: Self-pay | Admitting: *Deleted

## 2020-06-27 ENCOUNTER — Other Ambulatory Visit (HOSPITAL_COMMUNITY): Payer: Self-pay | Admitting: Psychiatry

## 2020-07-01 ENCOUNTER — Encounter: Payer: Self-pay | Admitting: Internal Medicine

## 2020-07-01 ENCOUNTER — Other Ambulatory Visit: Payer: Self-pay

## 2020-07-01 ENCOUNTER — Ambulatory Visit (INDEPENDENT_AMBULATORY_CARE_PROVIDER_SITE_OTHER): Payer: HMO | Admitting: Internal Medicine

## 2020-07-01 VITALS — BP 138/80 | HR 57 | Ht 61.0 in | Wt 162.2 lb

## 2020-07-01 DIAGNOSIS — Z794 Long term (current) use of insulin: Secondary | ICD-10-CM | POA: Diagnosis not present

## 2020-07-01 DIAGNOSIS — Z23 Encounter for immunization: Secondary | ICD-10-CM | POA: Diagnosis not present

## 2020-07-01 DIAGNOSIS — E782 Mixed hyperlipidemia: Secondary | ICD-10-CM

## 2020-07-01 DIAGNOSIS — E1165 Type 2 diabetes mellitus with hyperglycemia: Secondary | ICD-10-CM

## 2020-07-01 DIAGNOSIS — E039 Hypothyroidism, unspecified: Secondary | ICD-10-CM | POA: Diagnosis not present

## 2020-07-01 LAB — TSH: TSH: 2.82 u[IU]/mL (ref 0.35–4.50)

## 2020-07-01 LAB — T4, FREE: Free T4: 0.78 ng/dL (ref 0.60–1.60)

## 2020-07-01 LAB — POCT GLYCOSYLATED HEMOGLOBIN (HGB A1C): Hemoglobin A1C: 7.9 % — AB (ref 4.0–5.6)

## 2020-07-01 MED ORDER — LANTUS SOLOSTAR 100 UNIT/ML ~~LOC~~ SOPN: 20.0000 [IU] | PEN_INJECTOR | Freq: Every day | SUBCUTANEOUS | 3 refills | Status: DC

## 2020-07-01 MED ORDER — INSULIN PEN NEEDLE 32G X 4 MM MISC
3 refills | Status: DC
Start: 2020-07-01 — End: 2020-07-03

## 2020-07-01 MED ORDER — NOVOLOG FLEXPEN 100 UNIT/ML ~~LOC~~ SOPN: 8.0000 [IU] | PEN_INJECTOR | Freq: Three times a day (TID) | SUBCUTANEOUS | 3 refills | Status: DC

## 2020-07-01 NOTE — Progress Notes (Signed)
Patient ID: [name], female   DOB: Jun 20, 1966, 54 y.o.   MRN: [id number]  This visit occurred during the SARS-CoV-2 public health emergency.  Safety protocols were in place, including screening questions prior to the visit, additional usage of staff PPE, and extensive cleaning of exam room while observing appropriate contact time as indicated for disinfecting solutions.   HPI: [name] is a 54 y.o.-year-old female, returning for f/u for DM2, dx 2013, insulin-dependent, uncontrolled, without long-term complications and also hypothyroidism. Last visit 3 months ago.  DM2: Reviewed HbA1c levels: Lab Results  Component Value Date   HGBA1C 6.9 (A) 03/22/2020   HGBA1C 11.5 (A) 12/22/2019   HGBA1C 7.1 (A) 07/20/2019   Pt is on a regimen of: - Glipizide 10 mg before lunch and dinner - Cycloset 2.4 mg daily in am - Lantus 12 >> 14 >> 14-20 >> 20 >> 18 units at bedtime - started 12/2019 - NovoLog 8-10 >> 10 units before a snack at night-started 03/2020  She stopped Rybelsus 7 mg >> stopped 2/2 N and D >> diarrhea stopped She did not start Welchol 2 tabs 2x a day - added 03/2017 - too expensive On Turmeric, alpha-lipoic acid + 660 mcg biotin per day, Garlicin.  She had various intolerances to different diabetes medications:  Metformin ER >> in the past, she had gastric discomfort and severe diarrhea (loss of bowel control).  However, we restarted this 08/2017 and she is tolerating this well.  Januvia >> tried for 1-2 months >> left chest pain  She was wondering if she could take Invokana, however she had multiple vaginal yeast infections. She Also had nocturia 3-4 x a night.   She had reactive hypoglycemia symptoms while on Quercetin (was taking it for allergies) >> we stopped this it is known to increase insulin production by the pancreas.  She is checking sugars 4x a day with her new Libre CGM;  Freestyle libre CGM parameters: - Average: 211 - % active CGM time: 80% of the  time - Glucose variability 37.9% (target < or = to 36%) - time in range:  - very low (<54): 0% - low (54-69): 0% - normal range (70-180): 42% - high sugars (181-250): 30% - very high sugars (>250): 28%   Lowest: 68, highest: 455  She has many food intolerances: - Gluten >> cannot eat bread or pastry.  - raw vegetables, beans, cruciferous vegetables, fruit >> diarrhea.  - No dairy, however, she does eat ice cream, cereals  -at night She does not digest fat well after her cholecystectomy in 2011.   Meter: One Touch Mini  No CKD, last BUN/creatinine was:  Lab Results  Component Value Date   BUN 16 03/22/2020   CREATININE 0.71 03/22/2020  On irbesartan.  + HL: Lab Results  Component Value Date   CHOL 291 (H) 03/22/2020   HDL 123.90 03/22/2020   LDLCALC 132 (H) 03/22/2020   LDLDIRECT 205.0 01/24/2016   TRIG 175.0 (H) 03/22/2020   CHOLHDL 2 03/22/2020  Previous cholesterol levels were: 325/197/132/154.   She could not tolerate pravastatin or Livalo due to muscle aches.  She refused a referral to lipid clinic in the past for PCSK9 inhibitors.  We started fluvastatin XL.  She has not taken this medicine today.  In fact, she stopped it in the last few months.  - last dilated eye exam was in 12/2019: No DR, no macular degeneration, + cataract  Dr. Lennox Grumbles.  -She has Numbness and tingling in  her legs.  She is on the R enantiomer of alpha-lipoic acid.  She also has a history of PCOS- she has seen Dr. Barry Dienes at [facility] in the past - note from 08/26/2012: perimenopause, and at that time she was on Yasmin, subsequently [facility]anged to [facility]. She sees Dr. Vernice Jefferson with OB/GYN. She has PTSD, MDD, insomnia, anxiety, asthma, HTN, GERD, status post cholecystectomy, anemia, transaminitis. She also had increased uterine bleeding (on Necon). She continues seeing an Systems developer. She takes Nettle powder >> helped her with diarrhea, anemia, insomnia. 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 Also, Undecylenic acid and mastic gum, no carbs x 1-2 months.  She has IBS >> better on L- plantarum, S.bulardii - Brevibacillus Laterosporus 20 min before b'fast.   Hypothyroidism  -Diagnosed in 02/2018. We started levothyroxine 03/2018.  Pt is on levothyroxine 75 mcg daily (increased 03/2020), taken: - in am - fasting - at least 30 min from b'fast - no Ca, Fe, MVI, PPIs - not on Biotin  Reviewed her TFTs: Lab Results  Component Value Date   TSH 6.44 (H) 03/22/2020   TSH 2.96 04/01/2019   TSH 1.26 07/30/2018   TSH 10.64 (H) 04/04/2018   TSH 4.80 (H) 03/13/2018   TSH 2.91 01/24/2016   TSH 2.322 02/26/2013   TSH 2.050 08/07/2012   FREET4 0.91 03/22/2020   FREET4 0.60 04/01/2019   FREET4 0.84 07/30/2018   FREET4 0.72 04/04/2018   T3FREE 2.6 04/04/2018   Antithyroid antibodies were not elevated: Component     Latest Ref Rng & Units 04/04/2018  Thyroglobulin Ab     < or = 1 IU/mL <1  Thyroperoxidase Ab SerPl-aCnc     <9 IU/mL 2   No FH of thyroid cancer. No h/o radiation tx to head or neck.  No seaweed or kelp. No recent contrast studies. No herbal supplements. No Biotin use. No recent steroids use.   Pt denies: - feeling nodules in neck - hoarseness - dysphagia - choking - SOB with lying down  ROS: Constitutional: + weight gain/no weight loss, no fatigue, no subjective hyperthermia, no subjective hypothermia Eyes: no blurry vision, no xerophthalmia ENT: no sore throat, + see HPI Cardiovascular: no CP/no SOB/no palpitations/no leg swelling Respiratory: no cough/no SOB/no wheezing Gastrointestinal: no N/no V/no D/no C/no acid reflux Musculoskeletal: no muscle aches/no joint aches Skin: no rashes, no hair loss Neurological: no tremors/+ numbness/+ tingling/no dizziness  I reviewed pt's medications, allergies, PMH, social hx, family hx, and changes were documented in the history of present illness. Otherwise,  unchanged from my initial visit note.  Past Medical History:  Diagnosis Date  . Abnormal Papanicolaou smear of cervix with positive human papilloma virus (HPV) test 02/2017  . Anxiety   . Asthma   . Depression   . Diabetes mellitus type II   . Food intolerance   . Headache   . High blood pressure   . IBS (irritable bowel syndrome)   . Insomnia   . Migraines   . PCOS (polycystic ovarian syndrome)   . PTSD (post-traumatic stress disorder)   . Seasonal allergies   . Thyroid disease    Past Surgical History:  Procedure Laterality Date  . CHOLECYSTECTOMY    . GALLBLADDER SURGERY     Social History   Socioeconomic History  . Marital status: Single    Spouse name: Not on file  . Number of children: 0  . Years of education: 69  .  Highest education level: Master's degree (e.g., MA, MS, MEng, MEd, MSW, MBA)  Occupational History  . Occupation: Long Term Disability    Comment: PTSD  Tobacco Use  . Smoking status: Never Smoker  . Smokeless tobacco: Never Used  Vaping Use  . Vaping Use: Never used  Substance and Sexual Activity  . Alcohol use: Not Currently    Alcohol/week: 0.5 standard drinks    Types: 1 Standard drinks or equivalent per week  . Drug use: No  . Sexual activity: Not Currently    Partners: Male    Birth control/protection: Patch    Comment: 1st intercourse- 73, partners- 48, current partner- 31yr  Other Topics Concern  . Not on file  Social History Narrative   Regular exercise: yes, walk   Caffeine use: occasionally tea   Fun: Social group events, walking her dogs, movies   Denies abuse and feels safe where she lives.    Social Determinants of Health   Financial Resource Strain: Low Risk   . Difficulty of Paying Living Expenses: Not very hard  Food Insecurity: No Food Insecurity  . Worried About RCharity fundraiserin the Last Year: Never true  . Ran Out of Food in the Last Year: Never true  Transportation Needs: No Transportation Needs  . Lack of  Transportation (Medical): No  . Lack of Transportation (Non-Medical): No  Physical Activity: Inactive  . Days of Exercise per Week: 0 days  . Minutes of Exercise per Session: 0 min  Stress: Stress Concern Present  . Feeling of Stress : To some extent  Social Connections: Moderately Integrated  . Frequency of Communication with Friends and Family: More than three times a week  . Frequency of Social Gatherings with Friends and Family: More than three times a week  . Attends Religious Services: More than 4 times per year  . Active Member of Clubs or Organizations: Yes  . Attends CArchivistMeetings: 1 to 4 times per year  . Marital Status: Never married  Intimate Partner Violence: Not At Risk  . Fear of Current or Ex-Partner: No  . Emotionally Abused: No  . Physically Abused: No  . Sexually Abused: No   Current Outpatient Medications on File Prior to Visit  Medication Sig Dispense Refill  . albuterol (VENTOLIN HFA) 108 (90 Base) MCG/ACT inhaler INHALE 2 PUFFS BY MOUTH EVERY 4 TO 6 HOURS AS DIRECTED(RESCUE) 42.5 g 0  . Alpha-Lipoic Acid 600 MG CAPS Take 300 mg by mouth 2 (two) times daily.     .Marland Kitchenatenolol (TENORMIN) 100 MG tablet Take 1 tablet (100 mg total) by mouth 2 (two) times daily. 180 tablet 3  . Blood Glucose Monitoring Suppl (ONE TOUCH ULTRA MINI) w/Device KIT Use to check blood sugar 2 times a day. 1 kit 0  . clobetasol cream (TEMOVATE) 07.86% Apply 1 application topically 2 (two) times daily. (Patient not taking: Reported on 04/27/2020) 30 g 1  . clonazePAM (KLONOPIN) 0.5 MG tablet TAKE 1/2 TABLET(0.25 MG) BY MOUTH DAILY 15 tablet 0  . Continuous Blood Gluc Receiver (FREESTYLE LIBRE 14 DAY READER) DEVI For continuous blood glucose monitoring. 1 each 0  . Continuous Blood Gluc Sensor (FREESTYLE LIBRE 14 DAY SENSOR) MISC Use for continuous glucose monitoring. 2 each 11  . CYCLOSET 0.8 MG TABS TAKE 3 TABLETS BY MOUTH DAILY IN THE MORNING 270 tablet 0  . Digestive Enzymes  (ENZYME DIGEST PO) Take by mouth.    . fexofenadine (ALLEGRA) 180 MG  tablet Take 180 mg by mouth daily.    . fluvastatin XL (LESCOL XL) 80 MG 24 hr tablet Take 1 tablet (80 mg total) by mouth daily. 30 tablet 11  . glipiZIDE (GLUCOTROL) 5 MG tablet TAKE 2 tab 2x a day before lunch and dinner 360 tablet 3  . glucose blood (ONETOUCH ULTRA) test strip Use to check blood sugar once a day. 100 strip 11  . insulin aspart (NOVOLOG FLEXPEN) 100 UNIT/ML FlexPen Inject 8-10 Units into the skin 3 (three) times daily with meals. 15 mL 11  . insulin glargine (LANTUS SOLOSTAR) 100 UNIT/ML Solostar Pen Inject 12-18 Units into the skin at bedtime. (Patient taking differently: Inject 20 Units into the skin at bedtime. ) 5 pen 11  . Insulin Pen Needle 32G X 4 MM MISC Use 4 times a day 400 each 3  . irbesartan (AVAPRO) 75 MG tablet TAKE 1 TABLET(75 MG) BY MOUTH DAILY 90 tablet 0  . levothyroxine (SYNTHROID) 75 MCG tablet Take 1 tablet (75 mcg total) by mouth daily. 90 tablet 3  . lidocaine (XYLOCAINE) 2 % jelly Apply 1 application topically as needed. (Patient not taking: Reported on 04/27/2020) 30 mL 4  . medroxyPROGESTERone (DEPO-PROVERA) 150 MG/ML injection Inject 1 mL (150 mg total) into the muscle every 3 (three) months. (Patient not taking: Reported on 04/27/2020) 1 mL 4  . methocarbamol (ROBAXIN) 500 MG tablet TAKE 1 TABLET(500 MG) BY MOUTH EVERY 8 HOURS AS NEEDED FOR MUSCLE SPASMS 30 tablet 1  . naphazoline-pheniramine (VISINE-A) 0.025-0.3 % ophthalmic solution Place 1 drop into both eyes 2 (two) times daily.    . naproxen (NAPROSYN) 500 MG tablet Take 1 tablet (500 mg total) by mouth 2 (two) times daily with a meal. 30 tablet 1  . norelgestromin-ethinyl estradiol (ORTHO EVRA) 150-35 MCG/24HR transdermal patch Place patch onto the skin once weekly continuously. 4 patch 12  . OVER THE COUNTER MEDICATION 1 capsule daily. Probiotic renew life (Patient not taking: Reported on 04/27/2020)    . OVER THE COUNTER  MEDICATION  (Patient not taking: Reported on 04/27/2020)    . Semaglutide (RYBELSUS) 7 MG TABS Take 1 tablet by mouth daily. (Patient not taking: Reported on 04/27/2020) 30 tablet 11  . sertraline (ZOLOFT) 100 MG tablet TAKE 2 TABLETS(200 MG) BY MOUTH DAILY 180 tablet 0  . [DISCONTINUED] EFFEXOR XR 150 MG 24 hr capsule 375 mg daily.      Current Facility-Administered Medications on File Prior to Visit  Medication Dose Route Frequency Provider Last Rate Last Admin  . medroxyPROGESTERone (DEPO-PROVERA) injection 150 mg  150 mg Intramuscular Once Princess Bruins, MD       Allergies  Allergen Reactions  . Latex Itching and Swelling  . Fluticasone     Nose bleeds  . Metformin And Related     GI side effects  . Pravastatin     myalgias   Family History  Problem Relation Age of Onset  . Bipolar disorder Mother   . Hypertension Mother   . Diabetes Mother   . Alzheimer's disease Father   . Hypertension Father   . Alzheimer's disease Maternal Grandmother   . Heart disease Paternal Grandfather     PE: BP 138/80   Pulse (!) 57   Ht _0  (1.549 m)   Wt 162 lb 3.2 oz (73.6 kg)   SpO2 96%   BMI 30.65 kg/m  Body mass index is 30.65 kg/m.  Wt Readings from Last 3 Encounters:  07/01/20 162 lb 3.2  oz (73.6 kg)  03/22/20 147 lb (66.7 kg)  02/05/20 142 lb (64.4 kg)   Constitutional: overweight, in NAD Eyes: PERRLA, EOMI, no exophthalmos ENT: moist mucous membranes, no thyromegaly, no cervical lymphadenopathy Cardiovascular: RRR, No MRG Respiratory: CTA B Gastrointestinal: abdomen soft, NT, ND, BS+ Musculoskeletal: no deformities, strength intact in all 4 Skin: moist, warm, no rashes Neurological: no tremor with outstretched hands, DTR normal in all 4  ASSESSMENT: 1. DM2, insulin-dependent, fairly well controlled, without long term complications, but with hyperglycemia  2. HL - Very high LDL - high HDL - slightly high TG  3.  Hypothyroidism  PLAN:  1. DM2 - Patient with  previously well-controlled diabetes, hindered by binge eating when stressed.  She did see nutrition in the past.  She also has many medication intolerances which limit her capacity to treat her diabetes she could not tolerate Metformin.  She also could not tolerate Rybelsus due to diarrhea.  She is currently on a sulfonylurea, dopamine agonist, and basal-bolus since regimen.  We added NovoLog a snack at night at last visit, since sugars were very high after staying late and eating starches or other sweets.  Sugars were 300s after these episodes.  Of course, ideally, she would stop these, but if not possible, I advised her to take NovoLog before then. CGM interpretation: -At today's visit, we reviewed her CGM tracings:  It appears that her sugars are above target at most times of the day, and they increase throughout the day in a stepwise fashion.  Only 42% of the blood sugars are in range while 58% are high.  She had one low blood sugar at 68, but no other lows.  The projected HbA1c is 8.4%.  Therefore, we discussed that we need to intensify her diabetes control.  Since sugars are increasing throughout the day, this is a sign of not enough mealtime coverage.  We will start NovoLog before every meal (now she takes it at night when the sugars are high).  I advised her how to change the doses of NovoLog.  I also strongly advised her to eliminate sweets, which she is still eating, especially at night (ice cream, apple turnover).  I advised her that if she continues to eat these, she may need a higher dose of NovoLog, starting with 10 units and possibly even increasing to 14 units or more.  We will also increase the Lantus dose since sugars remain high throughout the day. - I advised her to:  Patient Instructions  Please continue: - Glipizide 10 mg 2x a day before meals  Try to increase: - Lantus 20 units at bedtime - NovoLog 8-10 units 15 min before every meal (you may need more insulin with larger  meals)  Please stop Cycloset.  Please return in 3 months.   - we checked her HbA1c: 7.9% (higher) - advised to check sugars at different times of the day - 4x a day, rotating check times - advised for yearly eye exams >> she is UTD - return to clinic in 3 months  2. HL -Reviewed lipid panel from 03/2020: LDL still above target: Lab Results  Component Value Date   CHOL 291 (H) 03/22/2020   HDL 123.90 03/22/2020   LDLCALC 132 (H) 03/22/2020   LDLDIRECT 205.0 01/24/2016   TRIG 175.0 (H) 03/22/2020   CHOLHDL 2 03/22/2020  -She could not tolerate Livalo.  She is on fluvastatin XL now-advised to take it consistently after the above results returned -She declined referral to  the lipid clinic for PCSK9 inhibitors. -At this visit, she tells me that she actually stopped fluvastatin XL, but without a clear reason.  She does agree to restart taking it.  3.   Hypothyroidism - latest thyroid labs reviewed with pt >> normal: Lab Results  Component Value Date   TSH 6.44 (H) 03/22/2020   - she continues on LT4 75 mcg daily (increased after the above results returned). - pt feels good on this dose. - we discussed about taking the thyroid hormone every day, with water, >30 minutes before breakfast, separated by >4 hours from acid reflux medications, calcium, iron, multivitamins. Pt. is taking it correctly. - will check thyroid tests today: TSH and fT4 - If labs are abnormal, she will need to return for repeat TFTs in 1.5 months  Component     Latest Ref Rng & Units 07/01/2020  TSH     0.35 - 4.50 uIU/mL 2.82  T4,Free(Direct)     0.60 - 1.60 ng/dL 0.78   Thyroid tests normal now.  Philemon Kingdom, MD PhD [facility] Endocrinology

## 2020-07-01 NOTE — Patient Instructions (Addendum)
Please continue: - Glipizide 10 mg 2x a day before meals  Try to increase: - Lantus 20 units at bedtime - NovoLog 8-10 units 15 min before every meal (you may need more insulin with larger meals)  Please stop Cycloset.  Please return in 3 months.

## 2020-07-03 MED ORDER — LANTUS SOLOSTAR 100 UNIT/ML ~~LOC~~ SOPN: 20.0000 [IU] | PEN_INJECTOR | Freq: Every day | SUBCUTANEOUS | 3 refills | Status: DC

## 2020-07-03 MED ORDER — NOVOLOG FLEXPEN 100 UNIT/ML ~~LOC~~ SOPN: 8.0000 [IU] | PEN_INJECTOR | Freq: Three times a day (TID) | SUBCUTANEOUS | 3 refills | Status: DC

## 2020-07-03 MED ORDER — INSULIN PEN NEEDLE 32G X 4 MM MISC: 3 refills | Status: DC

## 2020-07-09 ENCOUNTER — Other Ambulatory Visit: Payer: Self-pay | Admitting: Family

## 2020-07-21 ENCOUNTER — Other Ambulatory Visit: Payer: Self-pay | Admitting: Family

## 2020-07-21 DIAGNOSIS — I10 Essential (primary) hypertension: Secondary | ICD-10-CM

## 2020-07-22 ENCOUNTER — Ambulatory Visit (INDEPENDENT_AMBULATORY_CARE_PROVIDER_SITE_OTHER): Payer: HMO | Admitting: Obstetrics & Gynecology

## 2020-07-22 ENCOUNTER — Other Ambulatory Visit: Payer: Self-pay

## 2020-07-22 ENCOUNTER — Encounter: Payer: Self-pay | Admitting: Obstetrics & Gynecology

## 2020-07-22 VITALS — BP 130/84 | Ht 60.75 in | Wt 164.0 lb

## 2020-07-22 DIAGNOSIS — Z9189 Other specified personal risk factors, not elsewhere classified: Secondary | ICD-10-CM | POA: Diagnosis not present

## 2020-07-22 DIAGNOSIS — E6609 Other obesity due to excess calories: Secondary | ICD-10-CM

## 2020-07-22 DIAGNOSIS — Z01419 Encounter for gynecological examination (general) (routine) without abnormal findings: Secondary | ICD-10-CM

## 2020-07-22 DIAGNOSIS — Z3045 Encounter for surveillance of transdermal patch hormonal contraceptive device: Secondary | ICD-10-CM

## 2020-07-22 DIAGNOSIS — Z6831 Body mass index (BMI) 31.0-31.9, adult: Secondary | ICD-10-CM

## 2020-07-22 MED ORDER — NORELGESTROMIN-ETH ESTRADIOL 150-35 MCG/24HR TD PTWK
MEDICATED_PATCH | TRANSDERMAL | 4 refills | Status: DC
Start: 2020-07-22 — End: 2021-09-25

## 2020-07-22 NOTE — Progress Notes (Signed)
[name] May 07, 1966 [id number]   History:    54 y.o. G0 Single  RP:  Established patient presenting for annual gyn exam   HPI: Well on continuous Xulane.  Light menses occasionally.  No pelvic pain.  Abstinent.  Breasts normal.  Urine/BMs normal.  BMI 31.24.  Walks her dogs occasionally. Health labs with Fam MD. On Atenolol for cHTN. DM type 2 now on Insulin.  Past medical history,surgical history, family history and social history were all reviewed and documented in the EPIC chart.  Gynecologic History No LMP recorded. (Menstrual status: Perimenopausal).  Obstetric History OB History  Gravida Para Term Preterm AB Living  0 0 0 0 0 0  SAB TAB Ectopic Multiple Live Births  0 0 0 0       ROS: A ROS was performed and pertinent positives and negatives are included in the history.  GENERAL: No fevers or chills. HEENT: No change in vision, no earache, sore throat or sinus congestion. NECK: No pain or stiffness. CARDIOVASCULAR: No chest pain or pressure. No palpitations. PULMONARY: No shortness of breath, cough or wheeze. GASTROINTESTINAL: No abdominal pain, nausea, vomiting or diarrhea, melena or bright red blood per rectum. GENITOURINARY: No urinary frequency, urgency, hesitancy or dysuria. MUSCULOSKELETAL: No joint or muscle pain, no back pain, no recent trauma. DERMATOLOGIC: No rash, no itching, no lesions. ENDOCRINE: No polyuria, polydipsia, no heat or cold intolerance. No recent change in weight. HEMATOLOGICAL: No anemia or easy bruising or bleeding. NEUROLOGIC: No headache, seizures, numbness, tingling or weakness. PSYCHIATRIC: No depression, no loss of interest in normal activity or change in sleep pattern.     Exam:   BP 130/84   Ht 5' 0.75" (1.543 m)   Wt 164 lb (74.4 kg)   BMI 31.24 kg/m   Body mass index is 31.24 kg/m.  General appearance : Well developed well nourished female. No acute distress HEENT: Eyes: no retinal hemorrhage or exudates,  Neck supple,  trachea midline, no carotid bruits, no thyroidmegaly Lungs: Clear to auscultation, no rhonchi or wheezes, or rib retractions  Heart: Regular rate and rhythm, no murmurs or gallops Breast:Examined in sitting and supine position were symmetrical in appearance, no palpable masses or tenderness,  no skin retraction, no nipple inversion, no nipple discharge, no skin discoloration, no axillary or supraclavicular lymphadenopathy Abdomen: no palpable masses or tenderness, no rebound or guarding Extremities: no edema or skin discoloration or tenderness  Pelvic: Vulva: Normal             Vagina: No gross lesions or discharge  Cervix: No gross lesions or discharge  Uterus  AV, normal size, shape and consistency, non-tender and mobile  Adnexa  Without masses or tenderness  Anus: Normal   Assessment/Plan:  54 y.o. female for annual exam   1. Well female exam with routine gynecological exam Normal gynecologic exam.  No indication for Pap test this year.  Breast exam normal.  Repeat screening mammogram now.  Colonoscopy in 2015.  Health labs with family physician.  2. Encounter for surveillance of transdermal patch hormonal contraceptive device Well on Xulane patch continuously.  Occasional light menses.  No contraindication to continue.  Prescription sent to pharmacy.  3. Class 1 obesity due to excess calories with serious comorbidity and body mass index (BMI) of 31.0 to 31.9 in adult Recommend a lower calorie/carb diet.  Aerobic activities 5 times a week and light weightlifting every 2 days.  Other orders - norelgestromin-ethinyl estradiol (ORTHO EVRA) 150-35 MCG/24HR transdermal  patch; Place patch onto the skin once weekly continuously.  Genia Del MD, 12:18 PM 07/22/2020

## 2020-07-23 ENCOUNTER — Other Ambulatory Visit: Payer: Self-pay | Admitting: Internal Medicine

## 2020-07-23 DIAGNOSIS — M62838 Other muscle spasm: Secondary | ICD-10-CM

## 2020-07-23 NOTE — Telephone Encounter (Signed)
Ok to forward to pcp 

## 2020-07-26 ENCOUNTER — Telehealth: Payer: Self-pay | Admitting: *Deleted

## 2020-07-26 NOTE — Telephone Encounter (Signed)
PA done via cover my meds for Xulane patch 150-35 mcg tablet. Will wait for response from insurance.

## 2020-07-27 NOTE — Telephone Encounter (Signed)
Medication approved for patch until 09/16/2021

## 2020-07-29 ENCOUNTER — Other Ambulatory Visit: Payer: Self-pay | Admitting: Family

## 2020-07-29 ENCOUNTER — Other Ambulatory Visit: Payer: Self-pay

## 2020-07-29 ENCOUNTER — Telehealth: Payer: Self-pay | Admitting: Internal Medicine

## 2020-07-29 DIAGNOSIS — I10 Essential (primary) hypertension: Secondary | ICD-10-CM

## 2020-07-29 MED ORDER — INSULIN PEN NEEDLE 32G X 4 MM MISC: 3 refills | Status: DC

## 2020-07-29 NOTE — Telephone Encounter (Signed)
I resubmitted- with more information for the sig.  Sent in for 400 needles.

## 2020-07-29 NOTE — Telephone Encounter (Signed)
Patient called re: RX for Insulin Pen Needle 32G X 4 MM MISC. Patient's [facility] -  [facility] [phone] - [place], [place] - [address] AT [facility] [address] [place] Phone:  [phone]  Fax:  [phone]     Gave patient 1 box with 100 needles in October. Patient states she uses 4-6 needles per day and is out of needles. Patient's PHARM told patient that it is too soon to get a refill of the above RX. Patient requests to be called at ph# [phone] to be advised.

## 2020-08-03 ENCOUNTER — Telehealth: Payer: Self-pay | Admitting: Family

## 2020-08-03 ENCOUNTER — Telehealth: Payer: Self-pay

## 2020-08-03 MED ORDER — NOVOLOG FLEXPEN 100 UNIT/ML ~~LOC~~ SOPN
20.0000 [IU] | PEN_INJECTOR | Freq: Every day | SUBCUTANEOUS | 1 refills | Status: DC
Start: 2020-08-03 — End: 2020-10-26

## 2020-08-03 NOTE — Telephone Encounter (Signed)
New message    Patient needs to discuss insulin with the CMA when they called back

## 2020-08-03 NOTE — Telephone Encounter (Signed)
Chan, Yes, we can send it like this. Ty! C

## 2020-08-03 NOTE — Telephone Encounter (Signed)
Called pt to schedule AWV with [facility], but voice mail box was full. Unable to LVM, please schedule this appt if pt calls the office .   Thanks, [phone]

## 2020-08-03 NOTE — Telephone Encounter (Signed)
Refill sent as instructed. 

## 2020-08-03 NOTE — Telephone Encounter (Signed)
Spoken to patient and stated that she have been taking 20 units of Novolog 5 times a day. Patient stated that she will need a refill to accomodates the amount insulin she is taking. Patient's blood sugar have been running between 78 to 160.   Is it that okay?

## 2020-08-05 ENCOUNTER — Encounter: Payer: Self-pay | Admitting: Family

## 2020-08-05 ENCOUNTER — Other Ambulatory Visit: Payer: Self-pay

## 2020-08-05 ENCOUNTER — Ambulatory Visit (INDEPENDENT_AMBULATORY_CARE_PROVIDER_SITE_OTHER): Payer: HMO | Admitting: Family

## 2020-08-05 VITALS — BP 128/76 | HR 70 | Temp 98.2°F | Ht 60.75 in | Wt 168.6 lb

## 2020-08-05 DIAGNOSIS — Z1231 Encounter for screening mammogram for malignant neoplasm of breast: Secondary | ICD-10-CM | POA: Diagnosis not present

## 2020-08-05 DIAGNOSIS — Z23 Encounter for immunization: Secondary | ICD-10-CM

## 2020-08-05 DIAGNOSIS — Z1211 Encounter for screening for malignant neoplasm of colon: Secondary | ICD-10-CM | POA: Diagnosis not present

## 2020-08-05 DIAGNOSIS — Z0000 Encounter for general adult medical examination without abnormal findings: Secondary | ICD-10-CM | POA: Diagnosis not present

## 2020-08-05 DIAGNOSIS — M62838 Other muscle spasm: Secondary | ICD-10-CM | POA: Diagnosis not present

## 2020-08-05 DIAGNOSIS — I10 Essential (primary) hypertension: Secondary | ICD-10-CM

## 2020-08-05 MED ORDER — ATENOLOL 100 MG PO TABS: ORAL_TABLET | ORAL | 3 refills | Status: DC

## 2020-08-05 MED ORDER — IRBESARTAN 75 MG PO TABS: ORAL_TABLET | ORAL | 3 refills | Status: DC

## 2020-08-05 MED ORDER — METHOCARBAMOL 500 MG PO TABS: ORAL_TABLET | ORAL | 1 refills | Status: DC

## 2020-08-05 NOTE — Progress Notes (Signed)
[name] is a 54 y.o. female with the following history as recorded in EpicCare:  Patient Active Problem List   Diagnosis Date Noted  . Class 1 obesity 12/22/2019  . ASCUS with positive high risk HPV cervical 03/13/2017  . Type 2 diabetes mellitus with hyperglycemia, without long-term current use of insulin ([place]) 03/28/2016  . Routine general medical examination at a health care facility 01/24/2016  . Acquired hypothyroidism 01/24/2016  . Anemia 04/14/2014  . Hyperlipidemia 08/18/2013  . Essential hypertension 08/26/2012  . Irregular menses 08/07/2012  . History of abnormal Pap smear 08/07/2012  . GERD (gastroesophageal reflux disease) 08/07/2012  . Asthma, mild persistent 08/07/2012  . Migraine 08/07/2012  . History of PCOS 08/07/2012  . Genital warts 08/07/2012  . S/P cholecystectomy 08/07/2012  . Seasonal and perennial allergic rhinitis 08/07/2012  . Severe episode of recurrent major depressive disorder ([place]) 12/06/2011  . PTSD (post-traumatic stress disorder) 11/02/2011    Current Outpatient Medications  Medication Sig Dispense Refill  . albuterol (VENTOLIN HFA) 108 (90 Base) MCG/ACT inhaler INHALE 2 PUFFS BY MOUTH EVERY 4 TO 6 HOURS AS DIRECTED(RESCUE) 42.5 g 0  . Alpha-Lipoic Acid 600 MG CAPS Take 300 mg by mouth 2 (two) times daily.     Marland Kitchen atenolol (TENORMIN) 100 MG tablet TAKE 1 TABLET(100 MG) BY MOUTH TWICE DAILY 180 tablet 3  . Blood Glucose Monitoring Suppl (ONE TOUCH ULTRA MINI) w/Device KIT Use to check blood sugar 2 times a day. 1 kit 0  . clobetasol cream (TEMOVATE) 9.52 % Apply 1 application topically 2 (two) times daily. 30 g 1  . clonazePAM (KLONOPIN) 0.5 MG tablet TAKE 1/2 TABLET(0.25 MG) BY MOUTH DAILY 15 tablet 0  . Continuous Blood Gluc Receiver (FREESTYLE LIBRE 14 DAY READER) DEVI For continuous blood glucose monitoring. 1 each 0  . Continuous Blood Gluc Sensor (FREESTYLE LIBRE 14 DAY SENSOR) MISC Use for continuous glucose monitoring. 2 each 11  .  Digestive Enzymes (ENZYME DIGEST PO) Take by mouth.    . fexofenadine (ALLEGRA) 180 MG tablet Take 180 mg by mouth daily.    . fluvastatin XL (LESCOL XL) 80 MG 24 hr tablet Take 1 tablet (80 mg total) by mouth daily. 30 tablet 11  . glipiZIDE (GLUCOTROL) 5 MG tablet TAKE 2 tab 2x a day before lunch and dinner 360 tablet 3  . glucose blood (ONETOUCH ULTRA) test strip Use to check blood sugar once a day. 100 strip 11  . insulin aspart (NOVOLOG FLEXPEN) 100 UNIT/ML FlexPen Inject 20 Units into the skin 5 (five) times daily. 90 mL 1  . insulin glargine (LANTUS SOLOSTAR) 100 UNIT/ML Solostar Pen Inject 20-22 Units into the skin at bedtime. 30 mL 3  . Insulin Pen Needle 32G X 4 MM MISC Use 4 times a day to give insulin. 400 each 3  . irbesartan (AVAPRO) 75 MG tablet TAKE 1 TABLET(75 MG) BY MOUTH DAILY 90 tablet 3  . levothyroxine (SYNTHROID) 75 MCG tablet Take 1 tablet (75 mcg total) by mouth daily. 90 tablet 3  . lidocaine (XYLOCAINE) 2 % jelly Apply 1 application topically as needed. 30 mL 4  . methocarbamol (ROBAXIN) 500 MG tablet TAKE 1 TABLET(500 MG) BY MOUTH EVERY 8 HOURS AS NEEDED FOR MUSCLE SPASMS 60 tablet 1  . naphazoline-pheniramine (VISINE-A) 0.025-0.3 % ophthalmic solution Place 1 drop into both eyes 2 (two) times daily.    . norelgestromin-ethinyl estradiol (ORTHO EVRA) 150-35 MCG/24HR transdermal patch Place patch onto the skin once weekly continuously.  12 patch 4  . OVER THE COUNTER MEDICATION 1 capsule daily. Probiotic renew life     . OVER THE COUNTER MEDICATION     . sertraline (ZOLOFT) 100 MG tablet TAKE 2 TABLETS(200 MG) BY MOUTH DAILY 180 tablet 0   No current facility-administered medications for this visit.    Allergies: Latex, Fluticasone, Metformin and related, and Pravastatin  Past Medical History:  Diagnosis Date  . Abnormal Papanicolaou smear of cervix with positive human papilloma virus (HPV) test 02/2017  . Anxiety   . Asthma   . Depression   . Diabetes mellitus  type II   . Food intolerance   . Headache   . High blood pressure   . IBS (irritable bowel syndrome)   . Insomnia   . Migraines   . PCOS (polycystic ovarian syndrome)   . PTSD (post-traumatic stress disorder)   . Seasonal allergies   . Thyroid disease     Past Surgical History:  Procedure Laterality Date  . CHOLECYSTECTOMY    . GALLBLADDER SURGERY      Family History  Problem Relation Age of Onset  . Bipolar disorder Mother   . Hypertension Mother   . Diabetes Mother   . Alzheimer's disease Father   . Hypertension Father   . Alzheimer's disease Maternal Grandmother   . Heart disease Paternal Grandfather     Social History   Tobacco Use  . Smoking status: Never Smoker  . Smokeless tobacco: Never Used  Substance Use Topics  . Alcohol use: Not Currently    Alcohol/week: 0.5 standard drinks    Types: 1 Standard drinks or equivalent per week    Subjective:  Presents for yearly CPE; majority of healthcare needs are managed by her specialists including endocrinology, psychiatrist and GYN; in baseline state of health today with no acute concerns; needs refills on her medications;  Review of Systems  Constitutional: Negative.   HENT: Negative.   Eyes: Negative.   Respiratory: Negative.   Cardiovascular: Negative.   Gastrointestinal: Negative.   Genitourinary: Negative.   Musculoskeletal: Negative.   Skin: Negative.   Neurological: Negative.   Endo/Heme/Allergies: Negative.   Psychiatric/Behavioral: Negative.      Objective:  Vitals:   08/05/20 1304  BP: 128/76  Pulse: 70  Temp: 98.2 F (36.8 C)  TempSrc: Oral  SpO2: 97%  Weight: 168 lb 9.6 oz (76.5 kg)  Height: 5' 0.75" (1.543 m)    General: Well developed, well nourished, in no acute distress  Skin : Warm and dry.  Head: Normocephalic and atraumatic  Eyes: Sclera and conjunctiva clear; pupils round and reactive to light; extraocular movements intact  Ears: External normal; canals clear; tympanic  membranes normal  Oropharynx: Pink, supple. No suspicious lesions  Neck: Supple without thyromegaly, adenopathy  Lungs: Respirations unlabored; clear to auscultation bilaterally without wheeze, rales, rhonchi  CVS exam: normal rate and regular rhythm.  Musculoskeletal: No deformities; no active joint inflammation  Extremities: No edema, cyanosis, clubbing  Vessels: Symmetric bilaterally  Neurologic: Alert and oriented; speech intact; face symmetrical; moves all extremities well; CNII-XII intact without focal deficit   Assessment:  1. PE (physical exam), annual   2. Screening mammogram, encounter for   3. Essential hypertension   4. Muscle spasms of neck   5. Need for Tdap vaccination   6. Colon cancer screening     Plan:  Age appropriate preventive healthcare needs addressed; encouraged regular eye doctor and dental exams; encouraged regular exercise; will update labs and refills  as needed today; follow-up to be determined; Order for mammogram and Cologuard updated; Tdap updated; she will plan to get her COVID booster in 2 weeks;  Follow-up here in 1 year, sooner prn.   This visit occurred during the SARS-CoV-2 public health emergency.  Safety protocols were in place, including screening questions prior to the visit, additional usage of staff PPE, and extensive cleaning of exam room while observing appropriate contact time as indicated for disinfecting solutions.     No follow-ups on file.  Orders Placed This Encounter  Procedures  . MM Digital Screening    Standing Status:   Future    Standing Expiration Date:   08/05/2021    Order Specific Question:   Reason for Exam (SYMPTOM  OR DIAGNOSIS REQUIRED)    Answer:   screening mammogram    Order Specific Question:   Is the patient pregnant?    Answer:   No    Order Specific Question:   Preferred imaging location?    Answer:   [facility]  . Tdap vaccine greater than or equal to 7yo IM  . Cologuard    Requested  Prescriptions   Signed Prescriptions Disp Refills  . atenolol (TENORMIN) 100 MG tablet 180 tablet 3    Sig: TAKE 1 TABLET(100 MG) BY MOUTH TWICE DAILY  . irbesartan (AVAPRO) 75 MG tablet 90 tablet 3    Sig: TAKE 1 TABLET(75 MG) BY MOUTH DAILY  . methocarbamol (ROBAXIN) 500 MG tablet 60 tablet 1    Sig: TAKE 1 TABLET(500 MG) BY MOUTH EVERY 8 HOURS AS NEEDED FOR MUSCLE SPASMS

## 2020-08-26 ENCOUNTER — Other Ambulatory Visit: Payer: Self-pay | Admitting: Internal Medicine

## 2020-09-01 ENCOUNTER — Other Ambulatory Visit: Payer: Self-pay | Admitting: *Deleted

## 2020-09-01 NOTE — Patient Outreach (Signed)
  [facility]) Care Management Chronic Special Needs Program    09/01/2020  Name: [name], DOB: [birth year]  MRN: [id number]   Ms. [name] is enrolled in a chronic special needs plan for Diabetes.  [facility] care management will continue to provide services for this member through 09/16/20.  [facility] care management team will assume care 09/17/20.   [name] [facility], BSN [facility] RN Care Coordinator, CSNP [phone]

## 2020-09-02 ENCOUNTER — Telehealth (INDEPENDENT_AMBULATORY_CARE_PROVIDER_SITE_OTHER): Payer: HMO | Admitting: Psychiatry

## 2020-09-02 ENCOUNTER — Encounter (HOSPITAL_COMMUNITY): Payer: Self-pay | Admitting: Psychiatry

## 2020-09-02 DIAGNOSIS — F3341 Major depressive disorder, recurrent, in partial remission: Secondary | ICD-10-CM

## 2020-09-02 DIAGNOSIS — F411 Generalized anxiety disorder: Secondary | ICD-10-CM

## 2020-09-02 MED ORDER — SERTRALINE HCL 100 MG PO TABS
ORAL_TABLET | ORAL | 0 refills | Status: DC
Start: 2020-09-02 — End: 2021-03-15

## 2020-09-02 NOTE — Progress Notes (Signed)
Patient ID: [name], female   DOB: 06/28/1966, 54 y.o.   MRN: [id number]   [facility] Follow-up Outpatient Visit  [name] 05/15/1966 [id number] 54 y.o.  09/02/2020 10:49 AM  Patient location : home Provider location: home doing virtual   I connected with [name] on 09/02/20 at 10:30 AM EST by telephone and verified that I am speaking with the correct person using two identifiers.  I discussed the limitations, risks, security and privacy concerns of performing an evaluation and management service by telephone and the availability of in person appointments. I also discussed with the patient that there may be a patient responsible charge related to this service. The patient expressed understanding and agreed to proceed.  Chief Complaint: follow up depression, anxiety  HPI Comments: [name] is  a 54  y/o female with a past psychiatric history significant for Post traumatic stress Disorder, Major Depressive Disorder, recurrent, severe and GAD.  Dad has bought a home, doing fair, got 2 new baby dogs Handling stress.    tolerating zoloft Modifying factor: some friends      Past Medical Family, Social History:  Past Medical History:  Diagnosis Date  . Abnormal Papanicolaou smear of cervix with positive human papilloma virus (HPV) test 02/2017  . Anxiety   . Asthma   . Depression   . Diabetes mellitus type II   . Food intolerance   . Headache   . High blood pressure   . IBS (irritable bowel syndrome)   . Insomnia   . Migraines   . PCOS (polycystic ovarian syndrome)   . PTSD (post-traumatic stress disorder)   . Seasonal allergies   . Thyroid disease    Family History  Problem Relation Age of Onset  . Bipolar disorder Mother   . Hypertension Mother   . Diabetes Mother   . Alzheimer's disease Father   . Hypertension Father   . Alzheimer's disease Maternal Grandmother   . Heart disease Paternal Grandfather    Social History    Socioeconomic History  . Marital status: Single    Spouse name: Not on file  . Number of children: 0  . Years of education: 18  . Highest education level: Master's degree (e.g., MA, MS, MEng, MEd, MSW, MBA)  Occupational History  . Occupation: Long Term Disability    Comment: PTSD  Tobacco Use  . Smoking status: Never Smoker  . Smokeless tobacco: Never Used  Vaping Use  . Vaping Use: Never used  Substance and Sexual Activity  . Alcohol use: Not Currently    Alcohol/week: 0.5 standard drinks    Types: 1 Standard drinks or equivalent per week  . Drug use: No  . Sexual activity: Not Currently    Partners: Male    Birth control/protection: Patch    Comment: 1st intercourse- 16, partners- 40, current partner- 2yrs  Other Topics Concern  . Not on file  Social History Narrative   Regular exercise: yes, walk   Caffeine use: occasionally tea   Fun: Social group events, walking her dogs, movies   Denies abuse and feels safe where she lives.    Social Determinants of Health   Financial Resource Strain: Low Risk   . Difficulty of Paying Living Expenses: Not very hard  Food Insecurity: No Food Insecurity  . Worried About Running Out of Food in the Last Year: Never true  . Ran Out of Food in the Last Year: Never true  Transportation Needs: No Transportation   Needs  . Lack of Transportation (Medical): No  . Lack of Transportation (Non-Medical): No  Physical Activity: Inactive  . Days of Exercise per Week: 0 days  . Minutes of Exercise per Session: 0 min  Stress: Stress Concern Present  . Feeling of Stress : To some extent  Social Connections: Moderately Integrated  . Frequency of Communication with Friends and Family: More than three times a week  . Frequency of Social Gatherings with Friends and Family: More than three times a week  . Attends Religious Services: More than 4 times per year  . Active Member of Clubs or Organizations: Yes  . Attends Club or Organization Meetings:  1 to 4 times per year  . Marital Status: Never married  Intimate Partner Violence: Not At Risk  . Fear of Current or Ex-Partner: No  . Emotionally Abused: No  . Physically Abused: No  . Sexually Abused: No    Outpatient Encounter Medications as of 09/02/2020  Medication Sig  . albuterol (VENTOLIN HFA) 108 (90 Base) MCG/ACT inhaler INHALE 2 PUFFS BY MOUTH EVERY 4 TO 6 HOURS AS DIRECTED(RESCUE)  . Alpha-Lipoic Acid 600 MG CAPS Take 300 mg by mouth 2 (two) times daily.   . atenolol (TENORMIN) 100 MG tablet TAKE 1 TABLET(100 MG) BY MOUTH TWICE DAILY  . Blood Glucose Monitoring Suppl (ONE TOUCH ULTRA MINI) w/Device KIT Use to check blood sugar 2 times a day.  . clobetasol cream (TEMOVATE) 0.05 % Apply 1 application topically 2 (two) times daily.  . clonazePAM (KLONOPIN) 0.5 MG tablet TAKE 1/2 TABLET(0.25 MG) BY MOUTH DAILY  . Continuous Blood Gluc Receiver (FREESTYLE LIBRE 14 DAY READER) DEVI For continuous blood glucose monitoring.  . Continuous Blood Gluc Sensor (FREESTYLE LIBRE 14 DAY SENSOR) MISC Use for continuous glucose monitoring.  . Digestive Enzymes (ENZYME DIGEST PO) Take by mouth.  . fexofenadine (ALLEGRA) 180 MG tablet Take 180 mg by mouth daily.  . fluvastatin XL (LESCOL XL) 80 MG 24 hr tablet TAKE 1 TABLET BY MOUTH EVERY DAY  . glipiZIDE (GLUCOTROL) 5 MG tablet TAKE 2 tab 2x a day before lunch and dinner  . glucose blood (ONETOUCH ULTRA) test strip Use to check blood sugar once a day.  . insulin aspart (NOVOLOG FLEXPEN) 100 UNIT/ML FlexPen Inject 20 Units into the skin 5 (five) times daily.  . insulin glargine (LANTUS SOLOSTAR) 100 UNIT/ML Solostar Pen Inject 20-22 Units into the skin at bedtime.  . Insulin Pen Needle 32G X 4 MM MISC Use 4 times a day to give insulin.  . irbesartan (AVAPRO) 75 MG tablet TAKE 1 TABLET(75 MG) BY MOUTH DAILY  . levothyroxine (SYNTHROID) 75 MCG tablet Take 1 tablet (75 mcg total) by mouth daily.  . lidocaine (XYLOCAINE) 2 % jelly Apply 1  application topically as needed.  . methocarbamol (ROBAXIN) 500 MG tablet TAKE 1 TABLET(500 MG) BY MOUTH EVERY 8 HOURS AS NEEDED FOR MUSCLE SPASMS  . naphazoline-pheniramine (VISINE-A) 0.025-0.3 % ophthalmic solution Place 1 drop into both eyes 2 (two) times daily.  . norelgestromin-ethinyl estradiol (ORTHO EVRA) 150-35 MCG/24HR transdermal patch Place patch onto the skin once weekly continuously.  . OVER THE COUNTER MEDICATION 1 capsule daily. Probiotic renew life   . OVER THE COUNTER MEDICATION   . sertraline (ZOLOFT) 100 MG tablet TAKE 2 TABLETS(200 MG) BY MOUTH DAILY  . [DISCONTINUED] EFFEXOR XR 150 MG 24 hr capsule 375 mg daily.   . [DISCONTINUED] sertraline (ZOLOFT) 100 MG tablet TAKE 2 TABLETS(200 MG) BY MOUTH DAILY     No facility-administered encounter medications on file as of 09/02/2020.     Physical Exam: Constitutional:  There were no vitals taken for this visit.  Review of Systems  Cardiovascular: Negative for chest pain.  Psychiatric/Behavioral: Negative for depression and substance abuse.       Psychiatric Specialty exam: General Appearance:   Eye Contact::    Speech:  Clear and Coherent and Normal Rate  Volume:  Normal  Mood: fair  Affect:    Thought Process:  Coherent, Linear and Logical  Orientation:  Full (Time, Place, and Person)  Thought Content:  WDL  Suicidal Thoughts:  No  Homicidal Thoughts:  No  Memory:  Immediate;   Good Recent;   Good Remote;   Good  Judgement:  Fair  Insight:  Fair  Psychomotor Activity:  Normal  Concentration:  Good  Recall:  Good  Akathisia:  Negative  Language-Intact  Fund of knowledge-Average  Handed:  Right  AIMS (if indicated):     Assets:  Desire for Improvement Housing  Sleep:  Number of Hours: 1-9 hours     Assessment: Axis I: Post traumatic stress Disorder-Major Depressive Disorder, recurrent, severe- GAd    Plan of Care:  PLAN:   Affirm with the patient that the medications are taken as ordered.  Patient  expressed understanding of how their medications were to be used.    Laboratory:  No labs warranted at this time.   Psychotherapy: Therapy: brief supportive therapy provided.  Discussed psychosocial stressors in detail. More than 50% of the visit was spent on individual therapy/counseling.   Medications:  Continue  the following psychiatric medications as written prior to this appointment with the following changes::  1. Depression: remains stable, conitnue zoloft    2. PTSD:baseline, continue zoloft 3. GAD:  Fluctuates but manageable, continue zoloft,, Has taken klonopine prn  I discussed the assessment and treatment plan with the patient. The patient was provided an opportunity to ask questions and all were answered. The patient agreed with the plan and demonstrated an understanding of the instructions.   The patient was advised to call back or seek an in-person evaluation if the symptoms worsen or if the condition fails to improve as anticipated.  Wants to come in 5-71m  Can call earlier for concerns and for refill  Time spent non face to face 136m          NAFreddy JakschM.D.  09/02/2020 10:49 AM

## 2020-09-06 LAB — COLOGUARD: Cologuard: NEGATIVE

## 2020-09-13 LAB — EXTERNAL GENERIC LAB PROCEDURE: COLOGUARD: NEGATIVE

## 2020-09-13 LAB — COLOGUARD: COLOGUARD: NEGATIVE

## 2020-09-22 ENCOUNTER — Other Ambulatory Visit: Payer: Self-pay | Admitting: *Deleted

## 2020-10-25 ENCOUNTER — Telehealth: Payer: Self-pay | Admitting: Family

## 2020-10-25 ENCOUNTER — Telehealth: Payer: Self-pay | Admitting: Internal Medicine

## 2020-10-25 DIAGNOSIS — Z1231 Encounter for screening mammogram for malignant neoplasm of breast: Secondary | ICD-10-CM

## 2020-10-25 DIAGNOSIS — Z794 Long term (current) use of insulin: Secondary | ICD-10-CM

## 2020-10-25 DIAGNOSIS — E1165 Type 2 diabetes mellitus with hyperglycemia: Secondary | ICD-10-CM

## 2020-10-25 NOTE — Telephone Encounter (Signed)
Patient called and requested a refill for insulin aspart (NOVOLOG FLEXPEN) 100 UNIT/ML FlexPen [[id number]]  to be sent to [facility] on [address], [address]  Call back # [phone]

## 2020-10-25 NOTE — Telephone Encounter (Signed)
LVM for pt to rtn my call to schedule awv with nha. Please schedule awv if pt calls the office.  

## 2020-10-26 MED ORDER — NOVOLOG FLEXPEN 100 UNIT/ML ~~LOC~~ SOPN: 20.0000 [IU] | PEN_INJECTOR | Freq: Every day | SUBCUTANEOUS | 1 refills | Status: DC

## 2020-10-26 NOTE — Telephone Encounter (Signed)
Rx sent to preferred pharmacy.

## 2020-11-08 ENCOUNTER — Other Ambulatory Visit: Payer: Self-pay

## 2020-11-08 ENCOUNTER — Ambulatory Visit (INDEPENDENT_AMBULATORY_CARE_PROVIDER_SITE_OTHER): Payer: HMO | Admitting: Internal Medicine

## 2020-11-08 ENCOUNTER — Encounter: Payer: Self-pay | Admitting: Internal Medicine

## 2020-11-08 VITALS — BP 120/82 | HR 61 | Ht 60.75 in | Wt 172.6 lb

## 2020-11-08 DIAGNOSIS — E039 Hypothyroidism, unspecified: Secondary | ICD-10-CM

## 2020-11-08 DIAGNOSIS — E1165 Type 2 diabetes mellitus with hyperglycemia: Secondary | ICD-10-CM | POA: Diagnosis not present

## 2020-11-08 DIAGNOSIS — E782 Mixed hyperlipidemia: Secondary | ICD-10-CM | POA: Diagnosis not present

## 2020-11-08 DIAGNOSIS — Z794 Long term (current) use of insulin: Secondary | ICD-10-CM | POA: Diagnosis not present

## 2020-11-08 LAB — POCT GLYCOSYLATED HEMOGLOBIN (HGB A1C): Hemoglobin A1C: 6.1 % — AB (ref 4.0–5.6)

## 2020-11-08 NOTE — Patient Instructions (Addendum)
Please continue: - Lantus 20 units at bedtime  Decrease: - NovoLog 10-16 units 15 min before every meal (you may need more insulin with larger meals)  Please return in 3 months.

## 2020-11-08 NOTE — Progress Notes (Signed)
Patient ID: [name], female   DOB: 1966-08-19, 55 y.o.   MRN: [id number]  This visit occurred during the SARS-CoV-2 public health emergency.  Safety protocols were in place, including screening questions prior to the visit, additional usage of staff PPE, and extensive cleaning of exam room while observing appropriate contact time as indicated for disinfecting solutions.   HPI: [name] is a 55 y.o.-year-old female, returning for f/u for DM2, dx 2013, insulin-dependent, uncontrolled, without long-term complications and also hypothyroidism. Last visit 4 months ago.  DM2: Reviewed HbA1c levels: Lab Results  Component Value Date   HGBA1C 7.9 (A) 07/01/2020   HGBA1C 6.9 (A) 03/22/2020   HGBA1C 11.5 (A) 12/22/2019   Pt is on a regimen of: - Glipizide 10 mg before lunch and dinner >> stopped few mo ago - Lantus 12 >> 14 >> 14-20 >> 20 >> 18-20 units at bedtime - started 12/2019 - NovoLog 8-10 units before a snack at night-started 03/2020 >> before every meal 06/2020 >> now 18-34 units AFTER the largest meal, 14-22 units after the rest of the meal We stopped Cycloset in 06/2020. She stopped Rybelsus 7 mg >> stopped 2/2 N and D >> diarrhea stopped She did not start Welchol 2 tabs 2x a day - added 03/2017 - too expensive On Turmeric, alpha-lipoic acid + 660 mcg biotin per day, Garlicin.  She has various intolerances to different diabetic medicines:  Metformin ER >> in the past, she had gastric discomfort and severe diarrhea (loss of bowel control).  However, we restarted this 08/2017 and she is tolerating this well.  Januvia >> tried for 1-2 months >> left chest pain  She was wondering if she could take Invokana, however she had multiple vaginal yeast infections. She Also had nocturia 3-4 x a night.   She had reactive hypoglycemia symptoms while on Quercetin (was taking it for allergies) >> we stopped this it is known to increase insulin production by the pancreas.  She checks her  sugars more than 4 times a day with her libre CGM.  Freestyle libre CGM parameters:   Previously:  Lowest: 68 >> LO x2, highest: 455 >> 300s.  She has many food intolerances: - Gluten >> cannot eat bread or pastry.  - raw vegetables, beans, cruciferous vegetables, fruit >> diarrhea.  - No dairy, however, she does eat ice cream, cereals  -at night She does not digest fat well after her cholecystectomy in 2011.   Meter: One Touch Mini  No CKD, last BUN/creatinine was:  Lab Results  Component Value Date   BUN 16 03/22/2020   CREATININE 0.71 03/22/2020  On irbesartan.  + XL: Lab Results  Component Value Date   CHOL 291 (H) 03/22/2020   HDL 123.90 03/22/2020   LDLCALC 132 (H) 03/22/2020   LDLDIRECT 205.0 01/24/2016   TRIG 175.0 (H) 03/22/2020   CHOLHDL 2 03/22/2020  Previous cholesterol levels were: 325/197/132/154.   She could not tolerate pravastatin or Livalo due to muscle aches.  She refused a referral to lipid clinic in the past for PCSK9 inhibitors. At last visit I again advised her to start fluvastatin XL.  - last dilated eye exam was in 12/2019: No DR, no macular degeneration, + cataract. Dr. Lennox Grumbles.  - + Numbness and tingling in her legs.  She is on the R enantiomer of alpha-lipoic acid.  She also has a history of PCOS- she has seen Dr. Barry Dienes at [facility] in the past - note from 08/26/2012: perimenopause,  and at that time she was on Yasmin, subsequently c+hanged to [place]. She sees Dr. Vernice Jefferson with OB/GYN. She has PTSD, MDD, insomnia, anxiety, asthma, HTN, GERD, status post cholecystectomy, anemia, transaminitis. She also had increased uterine bleeding (on Necon). She continues seeing an Systems developer. She takes Nettle powder >> helped her with diarrhea, anemia, insomnia. 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 Also, Undecylenic acid and mastic gum, no carbs x 1-2 months.  She has IBS >> better on L- plantarum,  S.bulardii - Brevibacillus Laterosporus 20 min before b'fast.   Hypothyroidism  -Diagnosed in 02/2018. We started levothyroxine 03/2018.  Pt is on levothyroxine 75 mcg daily (dose increased 03/2020), taken: - in am - fasting - at least 30 min from b'fast - no calcium - no iron - no multivitamins - no PPIs - not on Biotin  Reviewed her TFTs: Lab Results  Component Value Date   TSH 2.82 07/01/2020   TSH 6.44 (H) 03/22/2020   TSH 2.96 04/01/2019   TSH 1.26 07/30/2018   TSH 10.64 (H) 04/04/2018   TSH 4.80 (H) 03/13/2018   TSH 2.91 01/24/2016   TSH 2.322 02/26/2013   TSH 2.050 08/07/2012   FREET4 0.78 07/01/2020   FREET4 0.91 03/22/2020   FREET4 0.60 04/01/2019   FREET4 0.84 07/30/2018   FREET4 0.72 04/04/2018   T3FREE 2.6 04/04/2018   Her antithyroid antibodies were not elevated: Component     Latest Ref Rng & Units 04/04/2018  Thyroglobulin Ab     < or = 1 IU/mL <1  Thyroperoxidase Ab SerPl-aCnc     <9 IU/mL 2   No FH of thyroid cancer. No h/o radiation tx to head or neck.  No herbal supplements. No Biotin use. No recent steroids use.   Pt denies: - feeling nodules in neck - hoarseness - dysphagia - choking - SOB with lying down  ROS: Constitutional: + weight gain/no weight loss, no fatigue, no subjective hyperthermia, no subjective hypothermia Eyes: no blurry vision, no xerophthalmia ENT: no sore throat, no nodules palpated in neck, no dysphagia, no odynophagia, no hoarseness Cardiovascular: no CP/no SOB/no palpitations/no leg swelling Respiratory: no cough/no SOB/no wheezing Gastrointestinal: no N/no V/no D/no C/no acid reflux Musculoskeletal: no muscle aches/no joint aches Skin: no rashes, no hair loss Neurological: no tremors/+ numbness/+ tingling/no dizziness  I reviewed pt's medications, allergies, PMH, social hx, family hx, and changes were documented in the history of present illness. Otherwise, unchanged from my initial visit note.  Past Medical  History:  Diagnosis Date  . Abnormal Papanicolaou smear of cervix with positive human papilloma virus (HPV) test 02/2017  . Anxiety   . Asthma   . Depression   . Diabetes mellitus type II   . Food intolerance   . Headache   . High blood pressure   . IBS (irritable bowel syndrome)   . Insomnia   . Migraines   . PCOS (polycystic ovarian syndrome)   . PTSD (post-traumatic stress disorder)   . Seasonal allergies   . Thyroid disease    Past Surgical History:  Procedure Laterality Date  . CHOLECYSTECTOMY    . GALLBLADDER SURGERY     Social History   Socioeconomic History  . Marital status: Single    Spouse name: Not on file  . Number of children: 0  . Years of education: 67  . Highest education level: Master's degree (e.g., MA, MS, MEng, MEd, MSW, MBA)  Occupational History  . Occupation:  Long Term Disability    Comment: PTSD  Tobacco Use  . Smoking status: Never Smoker  . Smokeless tobacco: Never Used  Vaping Use  . Vaping Use: Never used  Substance and Sexual Activity  . Alcohol use: Not Currently    Alcohol/week: 0.5 standard drinks    Types: 1 Standard drinks or equivalent per week  . Drug use: No  . Sexual activity: Not Currently    Partners: Male    Birth control/protection: Patch    Comment: 1st intercourse- 54, partners- 42, current partner- 28yr  Other Topics Concern  . Not on file  Social History Narrative   Regular exercise: yes, walk   Caffeine use: occasionally tea   Fun: Social group events, walking her dogs, movies   Denies abuse and feels safe where she lives.    Social Determinants of Health   Financial Resource Strain: Low Risk   . Difficulty of Paying Living Expenses: Not very hard  Food Insecurity: No Food Insecurity  . Worried About RCharity fundraiserin the Last Year: Never true  . Ran Out of Food in the Last Year: Never true  Transportation Needs: No Transportation Needs  . Lack of Transportation (Medical): No  . Lack of  Transportation (Non-Medical): No  Physical Activity: Inactive  . Days of Exercise per Week: 0 days  . Minutes of Exercise per Session: 0 min  Stress: Stress Concern Present  . Feeling of Stress : To some extent  Social Connections: Moderately Integrated  . Frequency of Communication with Friends and Family: More than three times a week  . Frequency of Social Gatherings with Friends and Family: More than three times a week  . Attends Religious Services: More than 4 times per year  . Active Member of Clubs or Organizations: Yes  . Attends CArchivistMeetings: 1 to 4 times per year  . Marital Status: Never married  Intimate Partner Violence: Not At Risk  . Fear of Current or Ex-Partner: No  . Emotionally Abused: No  . Physically Abused: No  . Sexually Abused: No   Current Outpatient Medications on File Prior to Visit  Medication Sig Dispense Refill  . albuterol (VENTOLIN HFA) 108 (90 Base) MCG/ACT inhaler INHALE 2 PUFFS BY MOUTH EVERY 4 TO 6 HOURS AS DIRECTED(RESCUE) 42.5 g 0  . Alpha-Lipoic Acid 600 MG CAPS Take 300 mg by mouth 2 (two) times daily.     .Marland Kitchenatenolol (TENORMIN) 100 MG tablet TAKE 1 TABLET(100 MG) BY MOUTH TWICE DAILY 180 tablet 3  . Blood Glucose Monitoring Suppl (ONE TOUCH ULTRA MINI) w/Device KIT Use to check blood sugar 2 times a day. 1 kit 0  . clobetasol cream (TEMOVATE) 03.73% Apply 1 application topically 2 (two) times daily. 30 g 1  . clonazePAM (KLONOPIN) 0.5 MG tablet TAKE 1/2 TABLET(0.25 MG) BY MOUTH DAILY 15 tablet 0  . Continuous Blood Gluc Receiver (FREESTYLE LIBRE 14 DAY READER) DEVI For continuous blood glucose monitoring. 1 each 0  . Continuous Blood Gluc Sensor (FREESTYLE LIBRE 14 DAY SENSOR) MISC Use for continuous glucose monitoring. 2 each 11  . Digestive Enzymes (ENZYME DIGEST PO) Take by mouth.    . fexofenadine (ALLEGRA) 180 MG tablet Take 180 mg by mouth daily.    . fluvastatin XL (LESCOL XL) 80 MG 24 hr tablet TAKE 1 TABLET BY MOUTH EVERY  DAY 30 tablet 3  . glipiZIDE (GLUCOTROL) 5 MG tablet TAKE 2 tab 2x a day before lunch and  dinner 360 tablet 3  . glucose blood (ONETOUCH ULTRA) test strip Use to check blood sugar once a day. 100 strip 11  . insulin aspart (NOVOLOG FLEXPEN) 100 UNIT/ML FlexPen Inject 20 Units into the skin 5 (five) times daily. 90 mL 1  . insulin glargine (LANTUS SOLOSTAR) 100 UNIT/ML Solostar Pen Inject 20-22 Units into the skin at bedtime. 30 mL 3  . Insulin Pen Needle 32G X 4 MM MISC Use 4 times a day to give insulin. 400 each 3  . irbesartan (AVAPRO) 75 MG tablet TAKE 1 TABLET(75 MG) BY MOUTH DAILY 90 tablet 3  . levothyroxine (SYNTHROID) 75 MCG tablet Take 1 tablet (75 mcg total) by mouth daily. 90 tablet 3  . lidocaine (XYLOCAINE) 2 % jelly Apply 1 application topically as needed. 30 mL 4  . methocarbamol (ROBAXIN) 500 MG tablet TAKE 1 TABLET(500 MG) BY MOUTH EVERY 8 HOURS AS NEEDED FOR MUSCLE SPASMS 60 tablet 1  . naphazoline-pheniramine (VISINE-A) 0.025-0.3 % ophthalmic solution Place 1 drop into both eyes 2 (two) times daily.    . norelgestromin-ethinyl estradiol (ORTHO EVRA) 150-35 MCG/24HR transdermal patch Place patch onto the skin once weekly continuously. 12 patch 4  . OVER THE COUNTER MEDICATION 1 capsule daily. Probiotic renew life     . OVER THE COUNTER MEDICATION     . sertraline (ZOLOFT) 100 MG tablet TAKE 2 TABLETS(200 MG) BY MOUTH DAILY 180 tablet 0  . [DISCONTINUED] EFFEXOR XR 150 MG 24 hr capsule 375 mg daily.      No current facility-administered medications on file prior to visit.   Allergies  Allergen Reactions  . Latex Itching and Swelling  . Fluticasone     Nose bleeds  . Metformin And Related     GI side effects  . Pravastatin     myalgias   Family History  Problem Relation Age of Onset  . Bipolar disorder Mother   . Hypertension Mother   . Diabetes Mother   . Alzheimer's disease Father   . Hypertension Father   . Alzheimer's disease Maternal Grandmother   . Heart  disease Paternal Grandfather     PE: BP 120/82   Pulse 61   Ht 5' 0.75" (1.543 m)   Wt 172 lb 9.6 oz (78.3 kg)   SpO2 97%   BMI 32.88 kg/m  Body mass index is 32.88 kg/m.  Wt Readings from Last 3 Encounters:  11/08/20 172 lb 9.6 oz (78.3 kg)  08/05/20 168 lb 9.6 oz (76.5 kg)  07/22/20 164 lb (74.4 kg)   Constitutional: overweight, in NAD Eyes: PERRLA, EOMI, no exophthalmos ENT: moist mucous membranes, no thyromegaly, no cervical lymphadenopathy Cardiovascular: RRR, No MRG Respiratory: CTA B Gastrointestinal: abdomen soft, NT, ND, BS+ Musculoskeletal: no deformities, strength intact in all 4 Skin: moist, warm, no rashes Neurological: no tremor with outstretched hands, DTR normal in all 4  ASSESSMENT: 1. DM2, insulin-dependent, fairly well controlled, without long term complications, but with hyperglycemia  2. HL - Very high LDL - high HDL - slightly high TG  3.  Hypothyroidism  PLAN:  1. DM2 - Patient with previously well-controlled diabetes, hindered by binge eating when stressed. She saw nutrition in the past. She also has many medication intolerances which limit our capacity to treat her diabetes and she could not tolerate Metformin. She is currently on sulfonylurea and basal-bolus insulin regimen. We added NovoLog when she started having blood sugars in the 300s after staying late and eating starches or other sweets. At  last visit we stopped Cycloset.  CGM interpretation: -At today's visit, we reviewed her CGM downloads: It appears that 67% of values are in target range (goal >70%), while 23% are higher than 180 (goal <25%), and 10% are lower than 70 (goal <4%).  The calculated average blood sugar is 140.  The projected HbA1c for the next 3 months (GMI) is 6.7%. -Reviewing the CGM trends, her sugars are higher in the evening and then have a peak at bedtime, after snacking followed by an abrupt drop.  She is also dropping her sugars after an initial increase postmeal.   Upon questioning, she is injecting much larger doses of NovoLog the recommended last visit and she is injecting only after meals.  To avoid large fluctuations in her blood sugars, I advised her to move the boluses 15 minutes before each meal.  Also, due to the hypoglycemic values between meals and occasionally at night, I advised her to decrease her insulin with meals.  Lantus dose appears to be adequate for now so we will continue this.  She stopped glipizide after our last visit and I do not see any point in restarting it. - I advised her to:  Patient Instructions  Please continue: - Lantus 20 units at bedtime  Decrease: - NovoLog 10-16 units 15 min before every meal (you may need more insulin with larger meals)  Please return in 3 months.   - we checked her HbA1c: 6.1% (lower) - advised to check sugars at different times of the day - 4x a day, rotating check times - advised for yearly eye exams >> she is UTD - return to clinic in 3 months  2. HL -Reviewed lipid panel from 03/2020: LDL still elevated, triglycerides also high: Lab Results  Component Value Date   CHOL 291 (H) 03/22/2020   HDL 123.90 03/22/2020   LDLCALC 132 (H) 03/22/2020   LDLDIRECT 205.0 01/24/2016   TRIG 175.0 (H) 03/22/2020   CHOLHDL 2 03/22/2020  -She could not tolerate Livalo. She is now back on fluvastatin XL, restarted at last visit, but she is not taking it consistently.  I advised her to start taking it every day we will recheck lipid panel at next visit. -She declined referral to the lipid clinic for PCSK9 inhibitors.  3.   Hypothyroidism - latest thyroid labs reviewed with pt >> normal: Lab Results  Component Value Date   TSH 2.82 07/01/2020   - she continues on LT4 75 mcg daily - pt feels good on this dose. - we discussed about taking the thyroid hormone every day, with water, >30 minutes before breakfast, separated by >4 hours from acid reflux medications, calcium, iron, multivitamins. Pt. is taking  it correctly. - we we will recheck these at next visit  Philemon Kingdom, MD PhD [facility] Endocrinology

## 2020-12-23 ENCOUNTER — Other Ambulatory Visit: Payer: Self-pay | Admitting: Family

## 2020-12-29 ENCOUNTER — Other Ambulatory Visit: Payer: Self-pay | Admitting: Internal Medicine

## 2021-01-14 ENCOUNTER — Other Ambulatory Visit: Payer: Self-pay | Admitting: Family

## 2021-01-14 DIAGNOSIS — M62838 Other muscle spasm: Secondary | ICD-10-CM

## 2021-01-16 NOTE — Telephone Encounter (Signed)
Patient is requesting a refill of the following medications: Requested Prescriptions   Pending Prescriptions Disp Refills  . methocarbamol (ROBAXIN) 500 MG tablet [Pharmacy Med Name: METHOCARBAMOL 500MG  TABLETS] 60 tablet 1    Sig: TAKE 1 TABLET(500 MG) BY MOUTH EVERY 8 HOURS AS NEEDED FOR MUSCLE SPASMS    Date of patient request: 01/14/21 Last office visit: 08/05/20 Date of last refill: 08/05/20 Last refill amount: 60 + 1 Follow up time period per chart: Appointment currently set 08/07/20 for [place] at the [facility] location on 08/08/21 @ 11am  We have called and sent a message via mychart to ask if she want follow 08/10/21 or stay at [facility].

## 2021-01-16 NOTE — Telephone Encounter (Signed)
I have attempted to call to ask if patient wanted to continue care with Vernona Rieger or stay at the [facility]. There was no answer so I left message to call back.   I have also send a message via my-chart since she is active on it.   Her next f/u appointment is scheduled for 08/08/21 @ 11am @ [facility] we can possibly use the same time if she wants to continue her care with Vernona Rieger and see her at [facility].

## 2021-01-24 ENCOUNTER — Other Ambulatory Visit: Payer: Self-pay | Admitting: Internal Medicine

## 2021-02-05 ENCOUNTER — Encounter: Payer: Self-pay | Admitting: Family

## 2021-02-05 DIAGNOSIS — I10 Essential (primary) hypertension: Secondary | ICD-10-CM

## 2021-02-06 ENCOUNTER — Encounter: Payer: Self-pay | Admitting: Internal Medicine

## 2021-02-06 ENCOUNTER — Ambulatory Visit (INDEPENDENT_AMBULATORY_CARE_PROVIDER_SITE_OTHER): Payer: HMO | Admitting: Internal Medicine

## 2021-02-06 ENCOUNTER — Other Ambulatory Visit: Payer: Self-pay

## 2021-02-06 VITALS — BP 120/78 | HR 56 | Ht 60.75 in | Wt 173.8 lb

## 2021-02-06 DIAGNOSIS — E782 Mixed hyperlipidemia: Secondary | ICD-10-CM | POA: Diagnosis not present

## 2021-02-06 DIAGNOSIS — E039 Hypothyroidism, unspecified: Secondary | ICD-10-CM

## 2021-02-06 DIAGNOSIS — Z794 Long term (current) use of insulin: Secondary | ICD-10-CM | POA: Diagnosis not present

## 2021-02-06 DIAGNOSIS — E1165 Type 2 diabetes mellitus with hyperglycemia: Secondary | ICD-10-CM

## 2021-02-06 LAB — POCT GLYCOSYLATED HEMOGLOBIN (HGB A1C): Hemoglobin A1C: 6.7 % — AB (ref 4.0–5.6)

## 2021-02-06 LAB — TSH: TSH: 2.02 u[IU]/mL (ref 0.35–4.50)

## 2021-02-06 LAB — T4, FREE: Free T4: 0.63 ng/dL (ref 0.60–1.60)

## 2021-02-06 MED ORDER — LANTUS SOLOSTAR 100 UNIT/ML ~~LOC~~ SOPN: 25.0000 [IU] | PEN_INJECTOR | Freq: Every day | SUBCUTANEOUS | 3 refills | Status: DC

## 2021-02-06 MED ORDER — NOVOLOG FLEXPEN 100 UNIT/ML ~~LOC~~ SOPN: 10.0000 [IU] | PEN_INJECTOR | Freq: Three times a day (TID) | SUBCUTANEOUS | 3 refills | Status: DC

## 2021-02-06 MED ORDER — IRBESARTAN 75 MG PO TABS: ORAL_TABLET | ORAL | 3 refills | Status: DC

## 2021-02-06 NOTE — Progress Notes (Signed)
Patient ID: [name], female   DOB: September 08, 1966, 55 y.o.   MRN: [id number]  This visit occurred during the SARS-CoV-2 public health emergency.  Safety protocols were in place, including screening questions prior to the visit, additional usage of staff PPE, and extensive cleaning of exam room while observing appropriate contact time as indicated for disinfecting solutions.   HPI: [name] is a 55 y.o.-year-old female, returning for f/u for DM2, dx 2013, insulin-dependent, uncontrolled, without long-term complications and also hypothyroidism. Last visit 4 months ago.  Interim history:  No increased urination, blurry vision, nausea. She continues to eat ice cream, which increases her blood sugars to about 250 even if she takes up to 36 units of NovoLog for it. Since last visit, she is setting the timer so she can take the insulin 15 minutes before a meal.  Sugars started to improve after starting this practice.  DM2: Reviewed HbA1c levels: Lab Results  Component Value Date   HGBA1C 6.1 (A) 11/08/2020   HGBA1C 7.9 (A) 07/01/2020   HGBA1C 6.9 (A) 03/22/2020   Pt is on a regimen of: - - Lantus 12 >> 14 >> 14-20 >> 20 >> 18-20 >> 20 units at bedtime  - NovoLog 18-34 units AFTER the largest meal, 14-22 units after the rest of the meal >> 10-20 for low carb meals, but 30-36 units for more carbs (usually 2 meals a day) 15 minutes before each meal We stopped Cycloset in 06/2020. She stopped Rybelsus 7 mg >> stopped 2/2 N and D >> diarrhea stopped She did not start Welchol 2 tabs 2x a day - added 03/2017 - too expensive On Turmeric, alpha-lipoic acid + 660 mcg biotin per day, Garlicin.  She has various intolerances to different diabetic medicines:  Metformin ER >> in the past, she had gastric discomfort and severe diarrhea (loss of bowel control).  However, we restarted this 08/2017 and she is tolerating this well.  Januvia >> tried for 1-2 months >> left chest pain  She was wondering  if she could take Invokana, however she had multiple vaginal yeast infections. She Also had nocturia 3-4 x a night.   She had reactive hypoglycemia symptoms while on Quercetin (was taking it for allergies) >> we stopped this it is known to increase insulin production by the pancreas.  She checks her sugars more than 4 times a day with her libre CGM.   Previously:   Previously:  Lowest: 68 >> LO x2 >> 60s Highest: 455 >> 300s >> 250s.  She has many food intolerances: - Gluten >> cannot eat bread or pastry.  - raw vegetables, beans, cruciferous vegetables, fruit >> diarrhea.  - No dairy, however, she does eat ice cream, cereals  -at night She does not digest fat well after her cholecystectomy in 2011.   Meter: One Touch Mini  No CKD, last BUN/creatinine was:  Lab Results  Component Value Date   BUN 16 03/22/2020   CREATININE 0.71 03/22/2020  On irbesartan.  + XL: Lab Results  Component Value Date   CHOL 291 (H) 03/22/2020   HDL 123.90 03/22/2020   LDLCALC 132 (H) 03/22/2020   LDLDIRECT 205.0 01/24/2016   TRIG 175.0 (H) 03/22/2020   CHOLHDL 2 03/22/2020  Previous cholesterol levels were: 325/197/132/154.   She could not tolerate pravastatin or Livalo due to muscle aches.  She refused a referral to lipid clinic in the past for PCSK9 inhibitors.  At last visit she was taking fluvastatin XL inconsistently.  I advised her to take it daily >> now taking this daily + CoQ10.  - last dilated eye exam was in 05/2020: No DR reportedly, no macular degeneration, + cataract. Dr. Lennox Grumbles.  - + Numbness and tingling in her legs.  She is on the R enantiomer of alpha-lipoic acid.  She also has a history of PCOS- she has seen Dr. Barry Dienes at [facility] in the past - note from 08/26/2012: perimenopause, and at that time she was on Yasmin, subsequently c+hanged to [facility]. She sees Dr. Vernice Jefferson with OB/GYN. She has PTSD, MDD, insomnia, anxiety, asthma, HTN, GERD, status post cholecystectomy,  anemia, transaminitis. She also had increased uterine bleeding (on Necon). She sawan acupuncturist. She takes Nettle powder >> helped her with diarrhea, anemia, insomnia. 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 Also, Undecylenic acid and mastic gum, no carbs x 1-2 months.  She has IBS >> better on L- plantarum, S.bulardii - Brevibacillus Laterosporus 20 min before b'fast.   Hypothyroidism  -Diagnosed in 02/2018. We started levothyroxine 03/2018.  Pt is on levothyroxine 75 mcg daily (dose increased 03/2020), taken: - in am - fasting - at least 30 min from b'fast - no calcium - no iron - no multivitamins - no PPIs - not on Biotin  Reviewed her TFTs: Lab Results  Component Value Date   TSH 2.82 07/01/2020   TSH 6.44 (H) 03/22/2020   TSH 2.96 04/01/2019   TSH 1.26 07/30/2018   TSH 10.64 (H) 04/04/2018   TSH 4.80 (H) 03/13/2018   TSH 2.91 01/24/2016   TSH 2.322 02/26/2013   TSH 2.050 08/07/2012   FREET4 0.78 07/01/2020   FREET4 0.91 03/22/2020   FREET4 0.60 04/01/2019   FREET4 0.84 07/30/2018   FREET4 0.72 04/04/2018   T3FREE 2.6 04/04/2018   Her antithyroid antibodies were not elevated: Component     Latest Ref Rng & Units 04/04/2018  Thyroglobulin Ab     < or = 1 IU/mL <1  Thyroperoxidase Ab SerPl-aCnc     <9 IU/mL 2   No FH of thyroid cancer. No h/o radiation tx to head or neck.  No herbal supplements. No Biotin use. No recent steroids use.   Pt denies: - feeling nodules in neck - hoarseness - dysphagia - choking - SOB with lying down  ROS: Constitutional: no weight gain/no weight loss, no fatigue, no subjective hyperthermia, no subjective hypothermia Eyes: no blurry vision, no xerophthalmia ENT: no sore throat, no nodules palpated in neck, no dysphagia, no odynophagia, no hoarseness, + allergy-induced congestion Cardiovascular: no CP/no SOB/no palpitations/no leg swelling Respiratory: no cough/no SOB/no  wheezing Gastrointestinal: no N/no V/no D/no C/no acid reflux Musculoskeletal: no muscle aches/no joint aches Skin: no rashes, no hair loss Neurological: no tremors/+ numbness/+ tingling/no dizziness  I reviewed pt's medications, allergies, PMH, social hx, family hx, and changes were documented in the history of present illness. Otherwise, unchanged from my initial visit note.  Past Medical History:  Diagnosis Date  . Abnormal Papanicolaou smear of cervix with positive human papilloma virus (HPV) test 02/2017  . Anxiety   . Asthma   . Depression   . Diabetes mellitus type II   . Food intolerance   . Headache   . High blood pressure   . IBS (irritable bowel syndrome)   . Insomnia   . Migraines   . PCOS (polycystic ovarian syndrome)   . PTSD (post-traumatic stress disorder)   . Seasonal allergies   .  Thyroid disease    Past Surgical History:  Procedure Laterality Date  . CHOLECYSTECTOMY    . GALLBLADDER SURGERY     Social History   Socioeconomic History  . Marital status: Single    Spouse name: Not on file  . Number of children: 0  . Years of education: 39  . Highest education level: Master's degree (e.g., MA, MS, MEng, MEd, MSW, MBA)  Occupational History  . Occupation: Long Term Disability    Comment: PTSD  Tobacco Use  . Smoking status: Never Smoker  . Smokeless tobacco: Never Used  Vaping Use  . Vaping Use: Never used  Substance and Sexual Activity  . Alcohol use: Not Currently    Alcohol/week: 0.5 standard drinks    Types: 1 Standard drinks or equivalent per week  . Drug use: No  . Sexual activity: Not Currently    Partners: Male    Birth control/protection: Patch    Comment: 1st intercourse- 31, partners- 61, current partner- 50yr  Other Topics Concern  . Not on file  Social History Narrative   Regular exercise: yes, walk   Caffeine use: occasionally tea   Fun: Social group events, walking her dogs, movies   Denies abuse and feels safe where she  lives.    Social Determinants of Health   Financial Resource Strain: Low Risk   . Difficulty of Paying Living Expenses: Not very hard  Food Insecurity: No Food Insecurity  . Worried About RCharity fundraiserin the Last Year: Never true  . Ran Out of Food in the Last Year: Never true  Transportation Needs: No Transportation Needs  . Lack of Transportation (Medical): No  . Lack of Transportation (Non-Medical): No  Physical Activity: Inactive  . Days of Exercise per Week: 0 days  . Minutes of Exercise per Session: 0 min  Stress: Stress Concern Present  . Feeling of Stress : To some extent  Social Connections: Moderately Integrated  . Frequency of Communication with Friends and Family: More than three times a week  . Frequency of Social Gatherings with Friends and Family: More than three times a week  . Attends Religious Services: More than 4 times per year  . Active Member of Clubs or Organizations: Yes  . Attends CArchivistMeetings: 1 to 4 times per year  . Marital Status: Never married  Intimate Partner Violence: Not At Risk  . Fear of Current or Ex-Partner: No  . Emotionally Abused: No  . Physically Abused: No  . Sexually Abused: No   Current Outpatient Medications on File Prior to Visit  Medication Sig Dispense Refill  . albuterol (VENTOLIN HFA) 108 (90 Base) MCG/ACT inhaler INHALE 2 PUFFS BY MOUTH EVERY 4 TO 6 HOURS AS DIRECTED(RESCUE) 42.5 g 0  . Alpha-Lipoic Acid 600 MG CAPS Take 300 mg by mouth 2 (two) times daily.     .Marland Kitchenatenolol (TENORMIN) 100 MG tablet TAKE 1 TABLET(100 MG) BY MOUTH TWICE DAILY 180 tablet 3  . Blood Glucose Monitoring Suppl (ONE TOUCH ULTRA MINI) w/Device KIT Use to check blood sugar 2 times a day. 1 kit 0  . clobetasol cream (TEMOVATE) 02.13% Apply 1 application topically 2 (two) times daily. 30 g 1  . clonazePAM (KLONOPIN) 0.5 MG tablet TAKE 1/2 TABLET(0.25 MG) BY MOUTH DAILY 15 tablet 0  . Continuous Blood Gluc Receiver (FREESTYLE LIBRE 14  DAY READER) DEVI For continuous blood glucose monitoring. 1 each 0  . Continuous Blood Gluc Sensor (FREESTYLE LIBRE  Crockett) MISC Use for continuous glucose monitoring. 2 each 11  . Digestive Enzymes (ENZYME DIGEST PO) Take by mouth.    . fexofenadine (ALLEGRA) 180 MG tablet Take 180 mg by mouth daily.    . fluvastatin XL (LESCOL XL) 80 MG 24 hr tablet TAKE 1 TABLET BY MOUTH EVERY DAY 30 tablet 0  . glipiZIDE (GLUCOTROL) 5 MG tablet TAKE 2 tab 2x a day before lunch and dinner 360 tablet 3  . glucose blood (ONETOUCH ULTRA) test strip Use to check blood sugar once a day. 100 strip 11  . insulin aspart (NOVOLOG FLEXPEN) 100 UNIT/ML FlexPen Inject 20 Units into the skin 5 (five) times daily. 90 mL 1  . insulin glargine (LANTUS SOLOSTAR) 100 UNIT/ML Solostar Pen Inject 20-22 Units into the skin at bedtime. 30 mL 3  . Insulin Pen Needle 32G X 4 MM MISC Use 4 times a day to give insulin. 400 each 3  . irbesartan (AVAPRO) 75 MG tablet TAKE 1 TABLET(75 MG) BY MOUTH DAILY 90 tablet 3  . levothyroxine (SYNTHROID) 75 MCG tablet TAKE 1 TABLET(75 MCG) BY MOUTH DAILY 90 tablet 1  . lidocaine (XYLOCAINE) 2 % jelly Apply 1 application topically as needed. 30 mL 4  . methocarbamol (ROBAXIN) 500 MG tablet TAKE 1 TABLET(500 MG) BY MOUTH EVERY 8 HOURS AS NEEDED FOR MUSCLE SPASMS 60 tablet 1  . naphazoline-pheniramine (NAPHCON-A) 0.025-0.3 % ophthalmic solution Place 1 drop into both eyes 2 (two) times daily.    . norelgestromin-ethinyl estradiol (ORTHO EVRA) 150-35 MCG/24HR transdermal patch Place patch onto the skin once weekly continuously. 12 patch 4  . OVER THE COUNTER MEDICATION 1 capsule daily. Probiotic renew life     . OVER THE COUNTER MEDICATION     . sertraline (ZOLOFT) 100 MG tablet TAKE 2 TABLETS(200 MG) BY MOUTH DAILY 180 tablet 0  . [DISCONTINUED] EFFEXOR XR 150 MG 24 hr capsule 375 mg daily.      No current facility-administered medications on file prior to visit.   Allergies  Allergen  Reactions  . Latex Itching and Swelling  . Fluticasone     Nose bleeds  . Metformin And Related     GI side effects  . Pravastatin     myalgias   Family History  Problem Relation Age of Onset  . Bipolar disorder Mother   . Hypertension Mother   . Diabetes Mother   . Alzheimer's disease Father   . Hypertension Father   . Alzheimer's disease Maternal Grandmother   . Heart disease Paternal Grandfather     PE: BP 120/78 (BP Location: Right Arm, Patient Position: Sitting, Cuff Size: Normal)   Pulse (!) 56   Ht 5' 0.75" (1.543 m)   Wt 173 lb 12.8 oz (78.8 kg)   SpO2 97%   BMI 33.11 kg/m  Body mass index is 33.11 kg/m.  Wt Readings from Last 3 Encounters:  02/06/21 173 lb 12.8 oz (78.8 kg)  11/08/20 172 lb 9.6 oz (78.3 kg)  08/05/20 168 lb 9.6 oz (76.5 kg)   Constitutional: overweight, in NAD Eyes: PERRLA, EOMI, no exophthalmos ENT: moist mucous membranes, no thyromegaly, no cervical lymphadenopathy Cardiovascular: RRR, No MRG Respiratory: CTA B Gastrointestinal: abdomen soft, NT, ND, BS+ Musculoskeletal: no deformities, strength intact in all 4 Skin: moist, warm, no rashes Neurological: no tremor with outstretched hands, DTR normal in all 4  ASSESSMENT: 1. DM2, insulin-dependent, fairly well controlled, without long term complications, but with hyperglycemia  2. HL - Very  high LDL - high HDL - slightly high TG  3.  Hypothyroidism  PLAN:  1. DM2 - Patient with well-controlled diabetes, hindered by binge eating when stressed.  She also has many medication intolerances, which limit our capacity to treat her diabetes.  She could not tolerate metformin.  She is currently on a basal-bolus insulin regimen.  We added mealtime insulin after she started to have blood sugars in the 300s due to staying late and eating starches and other sweets.  Sugars improved afterwards.  At last visit, sugars were higher in the evening, peaking at bedtime due to snacking, followed by an  abrupt drop.  She was also dropping her sugars after an initial increase postmeal and upon questioning, she was injecting much larger doses of NovoLog 10 recommended and she was injecting them only after meals.  We discussed about the importance of bolusing 15 minutes before each meal.  We also decreased her NovoLog doses.  We did not change her Lantus dose.  She was off glipizide and I did not see any point in restarting it. CGM interpretation: -At today's visit, we reviewed her CGM downloads: It appears that 79% of values are in target range (goal >70%), while 20% are higher than 180 (goal <25%), and 1% are lower than 70 (goal <4%).  The calculated average blood sugar is 147.  The projected HbA1c for the next 3 months (GMI) is 6.8%. -Reviewing the CGM trends, sugars have improved since last visit and they now fluctuate mostly in the normal range.  She still has higher blood sugars after dinner and after a snack at night, but overall, these peaks have improved with her taking the insulin 15 minutes before meals.  However, sugars can be slightly above our target fasting so I will advise her to increase her Lantus dose.  I am hoping that with this measure, we can decrease her dose of NovoLog, which varies quite a bit depending on whether she has low or high carb meals. - I advised her to:  Patient Instructions  Please increase: - Lantus 25 units at bedtime  Change: - NovoLog 10-30 units 15 min before every meal  Please stop at the lab.  Please return in 4 months.   - we checked her HbA1c: 6.8% (higher) - advised to check sugars at different times of the day - 4x a day, rotating check times - advised for yearly eye exams >> she is UTD - return to clinic in 4 months  2. HL -Reviewed latest lipid panel from 03/2020: LDL was still high, triglycerides also high: Lab Results  Component Value Date   CHOL 291 (H) 03/22/2020   HDL 123.90 03/22/2020   LDLCALC 132 (H) 03/22/2020   LDLDIRECT 205.0  01/24/2016   TRIG 175.0 (H) 03/22/2020   CHOLHDL 2 03/22/2020  -She could not tolerate Livalo.  She was on fluvastatin XL, tolerated well, but not taking consistently-again advised her to take it daily at last visit.  -She declined a referral to the lipid clinic for PCSK9 inhibitors -We will check a lipid panel today  3.   Hypothyroidism - latest thyroid labs reviewed with pt >> normal: Lab Results  Component Value Date   TSH 2.82 07/01/2020   - she continues on LT4 75 mcg daily - pt feels good on this dose. - we discussed about taking the thyroid hormone every day, with water, >30 minutes before breakfast, separated by >4 hours from acid reflux medications, calcium, iron, multivitamins. Pt. is  taking it correctly. - will check thyroid tests today: TSH and fT4 - If labs are abnormal, she will need to return for repeat TFTs in 1.5 months  Component     Latest Ref Rng & Units 02/06/2021  Sodium     135 - 145 mEq/L 137  Potassium     3.5 - 5.1 mEq/L 4.5  Chloride     96 - 112 mEq/L 100  CO2     19 - 32 mEq/L 22  Glucose     70 - 99 mg/dL 97  BUN     6 - 23 mg/dL 20  Creatinine     0.40 - 1.20 mg/dL 0.84  Total Bilirubin     0.2 - 1.2 mg/dL 0.3  Alkaline Phosphatase     39 - 117 U/L 65  AST     0 - 37 U/L 16  ALT     0 - 35 U/L 10  Total Protein     6.0 - 8.3 g/dL 8.3  Albumin     3.5 - 5.2 g/dL 3.9  Calcium     8.4 - 10.5 mg/dL 9.7  GFR     >60.00 mL/min 79.49  Cholesterol     0 - 200 mg/dL 293 (H)  Triglycerides     0.0 - 149.0 mg/dL 175.0 (H)  HDL Cholesterol     >39.00 mg/dL 105.60  VLDL     0.0 - 40.0 mg/dL 35.0  LDL (calc)     0 - 99 mg/dL 152 (H)  Total CHOL/HDL Ratio      3  NonHDL      187.41  TSH     0.35 - 4.50 uIU/mL 2.02  T4,Free(Direct)     0.60 - 1.60 ng/dL 0.63   TFTs are normal. LDL still high. She definitely needs a change in diet. Will also suggest to add Zetia 10 mg daily.  Philemon Kingdom, MD PhD [facility] Endocr[facility]

## 2021-02-06 NOTE — Patient Instructions (Signed)
Please increase: - Lantus 25 units at bedtime  Change: - NovoLog 10-30 units 15 min before every meal  Please stop at the lab.  Please return in 4 months.

## 2021-02-07 ENCOUNTER — Encounter: Payer: Self-pay | Admitting: Internal Medicine

## 2021-02-07 LAB — LIPID PANEL
Cholesterol: 293 mg/dL — ABNORMAL HIGH (ref 0–200)
HDL: 105.6 mg/dL (ref >39.00)
LDL Cholesterol: 152 mg/dL — ABNORMAL HIGH (ref 0–99)
NonHDL: 187.41
Total CHOL/HDL Ratio: 3
Triglycerides: 175 mg/dL — ABNORMAL HIGH (ref 0.0–149.0)
VLDL: 35 mg/dL (ref 0.0–40.0)

## 2021-02-07 LAB — COMPREHENSIVE METABOLIC PANEL
ALT: 10 U/L (ref 0–35)
AST: 16 U/L (ref 0–37)
Albumin: 3.9 g/dL (ref 3.5–5.2)
Alkaline Phosphatase: 65 U/L (ref 39–117)
BUN: 20 mg/dL (ref 6–23)
CO2: 22 mEq/L (ref 19–32)
Calcium: 9.7 mg/dL (ref 8.4–10.5)
Chloride: 100 mEq/L (ref 96–112)
Creatinine, Ser: 0.84 mg/dL (ref 0.40–1.20)
GFR: 79.49 mL/min (ref >60.00)
Glucose, Bld: 97 mg/dL (ref 70–99)
Potassium: 4.5 mEq/L (ref 3.5–5.1)
Sodium: 137 mEq/L (ref 135–145)
Total Bilirubin: 0.3 mg/dL (ref 0.2–1.2)
Total Protein: 8.3 g/dL (ref 6.0–8.3)

## 2021-03-07 ENCOUNTER — Telehealth (HOSPITAL_COMMUNITY): Payer: HMO | Admitting: Psychiatry

## 2021-03-10 ENCOUNTER — Telehealth: Payer: Self-pay | Admitting: Internal Medicine

## 2021-03-10 NOTE — Telephone Encounter (Signed)
[name]  from [employer] Is needing a PA for insulin glargine (LANTUS SOLOSTAR) 100 UNIT/ML Solostar Pen  Because it is to expensive for the patient   Fax was sent over 11/10/2020

## 2021-03-15 ENCOUNTER — Telehealth: Payer: Self-pay | Admitting: *Deleted

## 2021-03-15 ENCOUNTER — Other Ambulatory Visit: Payer: Self-pay

## 2021-03-15 ENCOUNTER — Encounter (HOSPITAL_COMMUNITY): Payer: Self-pay | Admitting: Psychiatry

## 2021-03-15 ENCOUNTER — Telehealth (INDEPENDENT_AMBULATORY_CARE_PROVIDER_SITE_OTHER): Payer: HMO | Admitting: Psychiatry

## 2021-03-15 DIAGNOSIS — F411 Generalized anxiety disorder: Secondary | ICD-10-CM | POA: Diagnosis not present

## 2021-03-15 DIAGNOSIS — F3341 Major depressive disorder, recurrent, in partial remission: Secondary | ICD-10-CM | POA: Diagnosis not present

## 2021-03-15 MED ORDER — SERTRALINE HCL 100 MG PO TABS: ORAL_TABLET | ORAL | 0 refills | Status: DC

## 2021-03-15 NOTE — Telephone Encounter (Signed)
Judeth Cornfield from [employer] have question regarding PA--[phone] option #3 Refrence #[id number]

## 2021-03-15 NOTE — Progress Notes (Signed)
Patient ID: [name], female   DOB: [birth year], 55 y.o.   MRN: [id number]   [facility] Follow-up Outpatient Visit  [name] [birth year] [id number] 55 y.o.  03/15/2021 11:44 AM  Virtual Visit via Telephone Note  I connected with [name] on 03/15/21 at 11:30 AM EDT by telephone and verified that I am speaking with the correct person using two identifiers.  Location: Patient: home Provider: home office   I discussed the limitations, risks, security and privacy concerns of performing an evaluation and management service by telephone and the availability of in person appointments. I also discussed with the patient that there may be a patient responsible charge related to this service. The patient expressed understanding and agreed to proceed.     I discussed the assessment and treatment plan with the patient. The patient was provided an opportunity to ask questions and all were answered. The patient agreed with the plan and demonstrated an understanding of the instructions.   The patient was advised to call back or seek an in-person evaluation if the symptoms worsen or if the condition fails to improve as anticipated.  I provided 12  minutes of non-face-to-face time during this encounter.   Abigail Capron, MD    HPI Comments: [name] is  a 55  y/o female with a past psychiatric history significant for Post traumatic stress Disorder, Major Depressive Disorder, recurrent, severe and GAD.  Has adapted 2 dogs, trying to keep busy but gets lonely with no friends Med keep some balance  Learning diabetes control    tolerating zoloft Modifying factor: reading books, has gym membership, planning to use     Past Medical Family, Social History:  Past Medical History:  Diagnosis Date   Abnormal Papanicolaou smear of cervix with positive human papilloma virus (HPV) test 02/2017   Anxiety    Asthma    Depression    Diabetes mellitus type II    Food  intolerance    Headache    High blood pressure    IBS (irritable bowel syndrome)    Insomnia    Migraines    PCOS (polycystic ovarian syndrome)    PTSD (post-traumatic stress disorder)    Seasonal allergies    Thyroid disease    Family History  Problem Relation Age of Onset   Bipolar disorder Mother    Hypertension Mother    Diabetes Mother    Alzheimer's disease Father    Hypertension Father    Alzheimer's disease Maternal Grandmother    Heart disease Paternal Grandfather    Social History   Socioeconomic History   Marital status: Single    Spouse name: Not on file   Number of children: 0   Years of education: 62   Highest education level: Master's degree (e.g., MA, MS, MEng, MEd, MSW, MBA)  Occupational History   Occupation: Long Term Disability    Comment: PTSD  Tobacco Use   Smoking status: Never   Smokeless tobacco: Never  Vaping Use   Vaping Use: Never used  Substance and Sexual Activity   Alcohol use: Not Currently    Alcohol/week: 0.5 standard drinks    Types: 1 Standard drinks or equivalent per week   Drug use: No   Sexual activity: Not Currently    Partners: Male    Birth control/protection: Patch    Comment: 1st intercourse- 59, partners- 78, current partner- 49yr  Other Topics Concern   Not on file  Social History Narrative  Regular exercise: yes, walk   Caffeine use: occasionally tea   Fun: Social group events, walking her dogs, movies   Denies abuse and feels safe where she lives.    Social Determinants of Health   Financial Resource Strain: Low Risk    Difficulty of Paying Living Expenses: Not very hard  Food Insecurity: No Food Insecurity   Worried About Charity fundraiser in the Last Year: Never true   Ran Out of Food in the Last Year: Never true  Transportation Needs: No Transportation Needs   Lack of Transportation (Medical): No   Lack of Transportation (Non-Medical): No  Physical Activity: Inactive   Days of Exercise per Week: 0  days   Minutes of Exercise per Session: 0 min  Stress: Stress Concern Present   Feeling of Stress : To some extent  Social Connections: Moderately Integrated   Frequency of Communication with Friends and Family: More than three times a week   Frequency of Social Gatherings with Friends and Family: More than three times a week   Attends Religious Services: More than 4 times per year   Active Member of [employer] or Organizations: Yes   Attends Archivist Meetings: 1 to 4 times per year   Marital Status: Never married  Intimate Partner Violence: Not At Risk   Fear of Current or Ex-Partner: No   Emotionally Abused: No   Physically Abused: No   Sexually Abused: No    Outpatient Encounter Medications as of 03/15/2021  Medication Sig   albuterol (VENTOLIN HFA) 108 (90 Base) MCG/ACT inhaler INHALE 2 PUFFS BY MOUTH EVERY 4 TO 6 HOURS AS DIRECTED(RESCUE)   Alpha-Lipoic Acid 600 MG CAPS Take 300 mg by mouth 2 (two) times daily.    atenolol (TENORMIN) 100 MG tablet TAKE 1 TABLET(100 MG) BY MOUTH TWICE DAILY   Blood Glucose Monitoring Suppl (ONE TOUCH ULTRA MINI) w/Device KIT Use to check blood sugar 2 times a day.   clobetasol cream (TEMOVATE) 4.69 % Apply 1 application topically 2 (two) times daily.   clonazePAM (KLONOPIN) 0.5 MG tablet TAKE 1/2 TABLET(0.25 MG) BY MOUTH DAILY   Continuous Blood Gluc Receiver (FREESTYLE LIBRE 14 DAY READER) DEVI For continuous blood glucose monitoring.   Continuous Blood Gluc Sensor (FREESTYLE LIBRE 14 DAY SENSOR) MISC Use for continuous glucose monitoring.   Digestive Enzymes (ENZYME DIGEST PO) Take by mouth.   fexofenadine (ALLEGRA) 180 MG tablet Take 180 mg by mouth daily.   fluvastatin XL (LESCOL XL) 80 MG 24 hr tablet TAKE 1 TABLET BY MOUTH EVERY DAY   glipiZIDE (GLUCOTROL) 5 MG tablet TAKE 2 tab 2x a day before lunch and dinner   glucose blood (ONETOUCH ULTRA) test strip Use to check blood sugar once a day.   insulin aspart (NOVOLOG FLEXPEN) 100  UNIT/ML FlexPen Inject 10-30 Units into the skin 3 (three) times daily before meals.   insulin glargine (LANTUS SOLOSTAR) 100 UNIT/ML Solostar Pen Inject 25 Units into the skin at bedtime.   Insulin Pen Needle 32G X 4 MM MISC Use 4 times a day to give insulin.   irbesartan (AVAPRO) 75 MG tablet TAKE 1 TABLET(75 MG) BY MOUTH DAILY   levothyroxine (SYNTHROID) 75 MCG tablet TAKE 1 TABLET(75 MCG) BY MOUTH DAILY   lidocaine (XYLOCAINE) 2 % jelly Apply 1 application topically as needed.   methocarbamol (ROBAXIN) 500 MG tablet TAKE 1 TABLET(500 MG) BY MOUTH EVERY 8 HOURS AS NEEDED FOR MUSCLE SPASMS   naphazoline-pheniramine (NAPHCON-A) 0.025-0.3 % ophthalmic  solution Place 1 drop into both eyes 2 (two) times daily.   norelgestromin-ethinyl estradiol (ORTHO EVRA) 150-35 MCG/24HR transdermal patch Place patch onto the skin once weekly continuously.   OVER THE COUNTER MEDICATION 1 capsule daily. Probiotic renew life    OVER THE COUNTER MEDICATION    sertraline (ZOLOFT) 100 MG tablet TAKE 2 TABLETS(200 MG) BY MOUTH DAILY   [DISCONTINUED] EFFEXOR XR 150 MG 24 hr capsule 375 mg daily.    [DISCONTINUED] sertraline (ZOLOFT) 100 MG tablet TAKE 2 TABLETS(200 MG) BY MOUTH DAILY   No facility-administered encounter medications on file as of 03/15/2021.     Physical Exam: Constitutional:  There were no vitals taken for this visit.  Review of Systems  Cardiovascular:  Negative for chest pain.  Psychiatric/Behavioral:  Negative for depression and substance abuse.       Psychiatric Specialty exam: General Appearance:   Eye Contact::    Speech:  Clear and Coherent and Normal Rate  Volume:  Normal  Mood: fair  Affect:    Thought Process:  Coherent, Linear and Logical  Orientation:  Full (Time, Place, and Person)  Thought Content:  WDL  Suicidal Thoughts:  No  Homicidal Thoughts:  No  Memory:  Immediate;   Good Recent;   Good Remote;   Good  Judgement:  Fair  Insight:  Fair  Psychomotor Activity:   Normal  Concentration:  Good  Recall:  Good  Akathisia:  Negative  Language-Intact  Fund of knowledge-Average  Handed:  Right  AIMS (if indicated):     Assets:  Desire for Improvement Housing  Sleep:  Number of Hours: 1-9 hours     Assessment: Axis I: Post traumatic stress Disorder-Major Depressive Disorder, recurrent, severe- GAd    derstanding of how their medications were to be used.         Medications:   Prior documentation reviewed  1. Depression: manageable on zoloft, will conitnue   2. PTSD:baseline, continue zoloft 3. GAD:   Fluctuates, discussed to work on self growth and activities, continue zoloft   Fu 69m Renewed med            NFreddy Jaksch M.D.  03/15/2021 11:44 AM

## 2021-03-22 NOTE — Telephone Encounter (Signed)
Forwarding to betty for PA

## 2021-04-05 ENCOUNTER — Encounter: Payer: Self-pay | Admitting: Family

## 2021-04-07 ENCOUNTER — Ambulatory Visit (INDEPENDENT_AMBULATORY_CARE_PROVIDER_SITE_OTHER): Payer: HMO | Admitting: Internal Medicine

## 2021-04-07 ENCOUNTER — Encounter: Payer: Self-pay | Admitting: Internal Medicine

## 2021-04-07 ENCOUNTER — Other Ambulatory Visit: Payer: Self-pay

## 2021-04-07 VITALS — BP 116/76 | HR 63 | Temp 98.5°F | Resp 16 | Ht 60.75 in | Wt 174.5 lb

## 2021-04-07 DIAGNOSIS — S30860A Insect bite (nonvenomous) of lower back and pelvis, initial encounter: Secondary | ICD-10-CM

## 2021-04-07 DIAGNOSIS — W57XXXA Bitten or stung by nonvenomous insect and other nonvenomous arthropods, initial encounter: Secondary | ICD-10-CM

## 2021-04-07 NOTE — Progress Notes (Signed)
Subjective:    Patient ID: [name], female    DOB: [birth year], 55 y.o.   MRN: [id number]  DOS:  04/07/2021 Type of visit - description: Acute  The patient noted a tick @ her back   approximately 2 weeks ago, it was on her skin for 2 days, she remove it 10 days ago. Has noted a rash around it, she physical exam.  She developed some chills but no fever Had some myalgias and a mild headache. Few days ago she also got a COVID-vaccine so she is not sure if symptoms are related to the tick bite or the immunization    Review of Systems See above   Past Medical History:  Diagnosis Date   Abnormal Papanicolaou smear of cervix with positive human papilloma virus (HPV) test 02/2017   Anxiety    Asthma    Depression    Diabetes mellitus type II    Food intolerance    Headache    High blood pressure    IBS (irritable bowel syndrome)    Insomnia    Migraines    PCOS (polycystic ovarian syndrome)    PTSD (post-traumatic stress disorder)    Seasonal allergies    Thyroid disease     Past Surgical History:  Procedure Laterality Date   CHOLECYSTECTOMY     GALLBLADDER SURGERY      Allergies as of 04/07/2021       Reactions   Latex Itching, Swelling   Fluticasone    Nose bleeds   Metformin And Related    GI side effects   Pravastatin    myalgias        Medication List        Accurate as of April 07, 2021 11:59 PM. If you have any questions, ask your nurse or doctor.          albuterol 108 (90 Base) MCG/ACT inhaler Commonly known as: VENTOLIN HFA INHALE 2 PUFFS BY MOUTH EVERY 4 TO 6 HOURS AS DIRECTED(RESCUE)   Alpha-Lipoic Acid 600 MG Caps Take 300 mg by mouth 2 (two) times daily.   atenolol 100 MG tablet Commonly known as: TENORMIN TAKE 1 TABLET(100 MG) BY MOUTH TWICE DAILY   clobetasol cream 0.05 % Commonly known as: TEMOVATE Apply 1 application topically 2 (two) times daily.   clonazePAM 0.5 MG tablet Commonly known as: KLONOPIN TAKE 1/2  TABLET(0.25 MG) BY MOUTH DAILY   ENZYME DIGEST PO Take by mouth.   fexofenadine 180 MG tablet Commonly known as: ALLEGRA Take 180 mg by mouth daily.   fluvastatin XL 80 MG 24 hr tablet Commonly known as: LESCOL XL TAKE 1 TABLET BY MOUTH EVERY DAY   FreeStyle Libre 14 Day Reader Kerrin Mo For continuous blood glucose monitoring.   FreeStyle Libre 14 Day Sensor Misc Use for continuous glucose monitoring.   glipiZIDE 5 MG tablet Commonly known as: GLUCOTROL TAKE 2 tab 2x a day before lunch and dinner   Insulin Pen Needle 32G X 4 MM Misc Use 4 times a day to give insulin.   irbesartan 75 MG tablet Commonly known as: AVAPRO TAKE 1 TABLET(75 MG) BY MOUTH DAILY   Lantus SoloStar 100 UNIT/ML Solostar Pen Generic drug: insulin glargine Inject 25 Units into the skin at bedtime.   levothyroxine 75 MCG tablet Commonly known as: SYNTHROID TAKE 1 TABLET(75 MCG) BY MOUTH DAILY   lidocaine 2 % jelly Commonly known as: XYLOCAINE Apply 1 application topically as needed.   methocarbamol 500 MG tablet  Commonly known as: ROBAXIN TAKE 1 TABLET(500 MG) BY MOUTH EVERY 8 HOURS AS NEEDED FOR MUSCLE SPASMS   naphazoline-pheniramine 0.025-0.3 % ophthalmic solution Commonly known as: NAPHCON-A Place 1 drop into both eyes 2 (two) times daily.   norelgestromin-ethinyl estradiol 150-35 MCG/24HR transdermal patch Commonly known as: ORTHO EVRA Place patch onto the skin once weekly continuously.   NovoLOG FlexPen 100 UNIT/ML FlexPen Generic drug: insulin aspart Inject 10-30 Units into the skin 3 (three) times daily before meals.   ONE TOUCH ULTRA MINI w/Device Kit Use to check blood sugar 2 times a day.   OneTouch Ultra test strip Generic drug: glucose blood Use to check blood sugar once a day.   OVER THE COUNTER MEDICATION   OVER THE COUNTER MEDICATION 1 capsule daily. Probiotic renew life   sertraline 100 MG tablet Commonly known as: ZOLOFT TAKE 2 TABLETS(200 MG) BY MOUTH DAILY            Objective:   Physical Exam BP 116/76 (BP Location: Left Arm, Patient Position: Sitting, Cuff Size: Small)   Pulse 63   Temp 98.5 F (36.9 C) (Oral)   Resp 16   Ht 5' 0.75" (1.543 m)   Wt 174 lb 8 oz (79.2 kg)   SpO2 97%   BMI 33.24 kg/m  General:   Well developed, NAD, BMI noted. HEENT:  Normocephalic . Face symmetric, atraumatic Skin: At the right buttock, she has a 3 x 2 mm scab, no  tick pieces  found.  Around that she has a very subtle circle of erythema approximately 5 to 8 cm in diameter. Neurologic:  alert & oriented X3.  Speech normal, gait appropriate for age and unassisted Psych--  Cognition and judgment appear intact.  Cooperative with normal attention span and concentration.  Behavior appropriate. No anxious or depressed appearing.      Assessment      55 year old female, PMH includes diabetes, hypothyroidism, obesity, HTN, PCOS, on insulin, on HRT, presents with:  Tick bite: As described above, happening approximately 2 weeks ago, tick was acquired in [facility], she does have a single circle- line of erythema around the area, has developed a mild headache, myalgias however she had a COVID-vaccine recently (side-effects?). D/W pt  options: Empiric treatment versus serology.  She is definitely against empiric antibiotics, will check a Lyme serology.  Further advised with results, call if symptoms increase, see AVS.  This visit occurred during the SARS-CoV-2 public health emergency.  Safety protocols were in place, including screening questions prior to the visit, additional usage of staff PPE, and extensive cleaning of exam room while observing appropriate contact time as indicated for disinfecting solutions.

## 2021-04-07 NOTE — Patient Instructions (Signed)
Please go to the lab  Watch the area, if it gets worse let me know.  Let us know if you develop fevers,  fatigue, severe aches and pains, major Headaches .

## 2021-04-10 LAB — LYME DISEASE SEROLOGY W/REFLEX: Lyme Total Antibody EIA: NEGATIVE

## 2021-04-25 ENCOUNTER — Telehealth: Payer: Self-pay | Admitting: Pharmacy Technician

## 2021-04-25 NOTE — Telephone Encounter (Signed)
Patient Advocate Encounter   Patient asked for a tier exception due to Lantus Solostar price being high.  I called patient's pharmacy and she was able to pick up 2 pen's 03/27/2021 for $25.26.   Jeannette How, CPhT Patient Advocate  [facility] Phone: [phone] Fax:  [phone]

## 2021-04-26 ENCOUNTER — Telehealth: Payer: Self-pay | Admitting: Internal Medicine

## 2021-04-26 DIAGNOSIS — Z794 Long term (current) use of insulin: Secondary | ICD-10-CM

## 2021-04-26 DIAGNOSIS — E1165 Type 2 diabetes mellitus with hyperglycemia: Secondary | ICD-10-CM

## 2021-04-26 NOTE — Telephone Encounter (Signed)
Patient called to request a new RX with refills for the following:  MEDICATION: Continuous Blood Gluc Sensor (FREESTYLE LIBRE 14 DAY SENSOR) MISC  PHARMACY:   [facility] [phone] - [place], [place] - [address] AT [facility] [address] [place] Phone:  [phone]  Fax:  [phone]      HAS THE PATIENT CONTACTED THEIR PHARMACY?  Yes--requires new RX  IS THIS A 90 DAY SUPPLY : If possible  IS PATIENT OUT OF MEDICATION: No  IF NOT; HOW MUCH IS LEFT: Approx. 13 days  LAST APPOINTMENT DATE: @5 /23/2022  NEXT APPOINTMENT DATE:@9 /29/2022  DO WE HAVE YOUR PERMISSION TO LEAVE A DETAILED MESSAGE?:Yes  OTHER COMMENTS:    **Let patient know to contact pharmacy at the end of the day to make sure medication is ready. **  ** Please notify patient to allow 48-72 hours to process**  **Encourage patient to contact the pharmacy for refills or they can request refills through [facility]**

## 2021-05-02 MED ORDER — FREESTYLE LIBRE 14 DAY SENSOR MISC: 3 refills | Status: DC

## 2021-05-02 NOTE — Telephone Encounter (Signed)
Pt calling to follow up on this last message. Pt is needing these sensors as soon as possible

## 2021-05-02 NOTE — Telephone Encounter (Signed)
Rx sent to preferred pharmacy.

## 2021-05-02 NOTE — Addendum Note (Signed)
Addended by: Kenyon Ana on: 05/02/2021 11:39 AM   Modules accepted: Orders

## 2021-05-14 ENCOUNTER — Other Ambulatory Visit: Payer: Self-pay | Admitting: Family

## 2021-05-14 DIAGNOSIS — M62838 Other muscle spasm: Secondary | ICD-10-CM

## 2021-05-31 ENCOUNTER — Telehealth: Payer: Self-pay

## 2021-05-31 NOTE — Telephone Encounter (Signed)
fluvastatin XL refill, pt had last dose yesterday. Please call pt for any questions [phone]

## 2021-06-15 ENCOUNTER — Encounter: Payer: Self-pay | Admitting: Internal Medicine

## 2021-06-15 ENCOUNTER — Ambulatory Visit (INDEPENDENT_AMBULATORY_CARE_PROVIDER_SITE_OTHER): Payer: HMO | Admitting: Internal Medicine

## 2021-06-15 ENCOUNTER — Other Ambulatory Visit: Payer: Self-pay

## 2021-06-15 VITALS — BP 130/88 | HR 57 | Ht 60.75 in | Wt 170.0 lb

## 2021-06-15 DIAGNOSIS — Z23 Encounter for immunization: Secondary | ICD-10-CM

## 2021-06-15 DIAGNOSIS — Z794 Long term (current) use of insulin: Secondary | ICD-10-CM | POA: Diagnosis not present

## 2021-06-15 DIAGNOSIS — E1165 Type 2 diabetes mellitus with hyperglycemia: Secondary | ICD-10-CM | POA: Diagnosis not present

## 2021-06-15 DIAGNOSIS — E039 Hypothyroidism, unspecified: Secondary | ICD-10-CM | POA: Diagnosis not present

## 2021-06-15 DIAGNOSIS — E782 Mixed hyperlipidemia: Secondary | ICD-10-CM | POA: Diagnosis not present

## 2021-06-15 LAB — POCT GLYCOSYLATED HEMOGLOBIN (HGB A1C): Hemoglobin A1C: 5.9 % — AB (ref 4.0–5.6)

## 2021-06-15 MED ORDER — FLUVASTATIN SODIUM ER 80 MG PO TB24: 80.0000 mg | ORAL_TABLET | Freq: Every day | ORAL | 3 refills | Status: DC

## 2021-06-15 NOTE — Progress Notes (Signed)
Patient ID: [name], female   DOB: 03/01/1966, 55 y.o.   MRN: [id number]  This visit occurred during the SARS-CoV-2 public health emergency.  Safety protocols were in place, including screening questions prior to the visit, additional usage of staff PPE, and extensive cleaning of exam room while observing appropriate contact time as indicated for disinfecting solutions.   HPI: [name] is a 55 y.o.-year-old female, returning for f/u for DM2, dx 2013, insulin-dependent, uncontrolled, without long-term complications and also hypothyroidism. Last visit 4 months ago.  Interim history: No increased urination, blurry vision, nausea, chest pain. She does have occasional shortness of breath-she has asthma and allergies.  She takes Allegra which dries out her nasal passages and has had some nasal bleeding recently. At last visit she was taking up to 36 units of NovoLog for ice cream.  We did discuss at last visit the absolute need to stop this not only for diabetes control but also for cholesterol improvement. She is now off icecream. She eats muffins, candy.  Sugars are higher after candy, in the 200s. She occasionally misses her Lantus doses.  DM2: Reviewed HbA1c levels: Lab Results  Component Value Date   HGBA1C 6.7 (A) 02/06/2021   HGBA1C 6.1 (A) 11/08/2020   HGBA1C 7.9 (A) 07/01/2020   Pt is on a regimen of: -- Lantus 12 >> ... 20 >> 25 >> 20 units at bedtime  - NovoLog 18-34 units AFTER the largest meal, 14-22 units after the rest of the meal >> 10-20 for low carb meals, but 30-36 units for more carbs (usually 2 meals a day) >> 10-30 >> 18-25 units 15 minutes before each meal, 6-12 for snacks We stopped Cycloset in 06/2020. She stopped Rybelsus 7 mg >> stopped 2/2 N and D >> diarrhea stopped She did not start Welchol 2 tabs 2x a day - added 03/2017 - too expensive On Turmeric, alpha-lipoic acid + 660 mcg biotin per day, Garlicin.  She has various intolerances to different  diabetic medicines: Metformin ER >> in the past, she had gastric discomfort and severe diarrhea (loss of bowel control).  However, we restarted this 08/2017 and she is tolerating this well. Januvia >> tried for 1-2 months >> left chest pain She was wondering if she could take Invokana, however she had multiple vaginal yeast infections. She Also had nocturia 3-4 x a night.  She had reactive hypoglycemia symptoms while on Quercetin (was taking it for allergies) >> we stopped this it is known to increase insulin production by the pancreas.  She checks her sugars more than 4 times a day with her libre CGM:   Prev.:   Previously:   Lowest: 68 >> LO x2 >> 60s Highest: 455 >> 300s >> 250s.  She has many food intolerances: - Gluten >> cannot eat bread or pastry.  - raw vegetables, beans, cruciferous vegetables, fruit >> diarrhea.  - No dairy, however, she does eat ice cream, cereals  -at night She does not digest fat well after her cholecystectomy in 2011.   Meter: One Touch Mini  No CKD, last BUN/creatinine was:  Lab Results  Component Value Date   BUN 20 02/06/2021   CREATININE 0.84 02/06/2021  On irbesartan.  + XL: Lab Results  Component Value Date   CHOL 293 (H) 02/06/2021   HDL 105.60 02/06/2021   LDLCALC 152 (H) 02/06/2021   LDLDIRECT 205.0 01/24/2016   TRIG 175.0 (H) 02/06/2021   CHOLHDL 3 02/06/2021  Previous cholesterol levels were: 325/197/132/154.  She could not tolerate pravastatin or Livalo due to muscle aches.  She refused a referral to lipid clinic in the past for PCSK9 inhibitors.  At last visit she was taking fluvastatin XL inconsistently.  I advised her to take it daily >> now taking this daily + CoQ10. At last visit I suggested Zetia but she refused due to the possibility of developing diarrhea on it.  - last dilated eye exam was in 05/2020: No DR reportedly, no macular degeneration, + cataract. Dr. Lennox Grumbles. Coming up in 1 week.  - + Numbness and tingling in  her legs.  She is on the R enantiomer of alpha-lipoic acid.  She also has a history of PCOS- she has seen Dr. Barry Dienes at [facility] in the past - note from 08/26/2012: perimenopause, and at that time she was on Yasmin, subsequently changed to [place]. She sees Dr. Vernice Jefferson with OB/GYN. She has PTSD, MDD, insomnia, anxiety, asthma, HTN, GERD, status post cholecystectomy, anemia, transaminitis. She also had increased uterine bleeding (on Necon). She sawan acupuncturist. She takes Nettle powder >> helped her with diarrhea, anemia, insomnia. 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 Also, Undecylenic acid and mastic gum, no carbs x 1-2 months.  She has IBS >> better on L- plantarum, S.bulardii - Brevibacillus Laterosporus 20 min before b'fast.   Hypothyroidism  -Diagnosed in 02/2018. We started levothyroxine 03/2018.  Pt is on levothyroxine 75 mcg daily (dose increased 03/2020), taken: - in am - fasting - at least 30 min from b'fast - no calcium - no iron - no multivitamins - no PPIs - not on Biotin  Reviewed her TFTs: Lab Results  Component Value Date   TSH 2.02 02/06/2021   TSH 2.82 07/01/2020   TSH 6.44 (H) 03/22/2020   TSH 2.96 04/01/2019   TSH 1.26 07/30/2018   TSH 10.64 (H) 04/04/2018   TSH 4.80 (H) 03/13/2018   TSH 2.91 01/24/2016   TSH 2.322 02/26/2013   TSH 2.050 08/07/2012   FREET4 0.63 02/06/2021   FREET4 0.78 07/01/2020   FREET4 0.91 03/22/2020   FREET4 0.60 04/01/2019   FREET4 0.84 07/30/2018   FREET4 0.72 04/04/2018   T3FREE 2.6 04/04/2018   Her antithyroid antibodies were not elevated: Component     Latest Ref Rng & Units 04/04/2018  Thyroglobulin Ab     < or = 1 IU/mL <1  Thyroperoxidase Ab SerPl-aCnc     <9 IU/mL 2   No FH of thyroid cancer. No h/o radiation tx to head or neck.  No herbal supplements. No Biotin use. No recent steroids use.   Pt denies: - feeling nodules in neck - hoarseness -  dysphagia - choking - SOB with lying down  ROS: + See HPI Neurological: no tremors/+ numbness/+ tingling/no dizziness  I reviewed pt's medications, allergies, PMH, social hx, family hx, and changes were documented in the history of present illness. Otherwise, unchanged from my initial visit note.  Past Medical History:  Diagnosis Date   Abnormal Papanicolaou smear of cervix with positive human papilloma virus (HPV) test 02/2017   Anxiety    Asthma    Depression    Diabetes mellitus type II    Food intolerance    Headache    High blood pressure    IBS (irritable bowel syndrome)    Insomnia    Migraines    PCOS (polycystic ovarian syndrome)    PTSD (post-traumatic stress disorder)    Seasonal allergies  Thyroid disease    Past Surgical History:  Procedure Laterality Date   CHOLECYSTECTOMY     GALLBLADDER SURGERY     Social History   Socioeconomic History   Marital status: Single    Spouse name: Not on file   Number of children: 0   Years of education: 38   Highest education level: Master's degree (e.g., MA, MS, MEng, MEd, MSW, MBA)  Occupational History   Occupation: Long Term Disability    Comment: PTSD  Tobacco Use   Smoking status: Never   Smokeless tobacco: Never  Vaping Use   Vaping Use: Never used  Substance and Sexual Activity   Alcohol use: Not Currently    Alcohol/week: 0.5 standard drinks    Types: 1 Standard drinks or equivalent per week   Drug use: No   Sexual activity: Not Currently    Partners: Male    Birth control/protection: Patch    Comment: 1st intercourse- 74, partners- 40, current partner- 65yr  Other Topics Concern   Not on file  Social History Narrative   Regular exercise: yes, walk   Caffeine use: occasionally tea   Fun: Social group events, walking her dogs, movies   Denies abuse and feels safe where she lives.    Social Determinants of Health   Financial Resource Strain: Not on file  Food Insecurity: Not on file   Transportation Needs: Not on file  Physical Activity: Not on file  Stress: Not on file  Social Connections: Not on file  Intimate Partner Violence: Not on file   Current Outpatient Medications on File Prior to Visit  Medication Sig Dispense Refill   albuterol (VENTOLIN HFA) 108 (90 Base) MCG/ACT inhaler INHALE 2 PUFFS BY MOUTH EVERY 4 TO 6 HOURS AS DIRECTED(RESCUE) 42.5 g 0   Alpha-Lipoic Acid 600 MG CAPS Take 300 mg by mouth 2 (two) times daily.      atenolol (TENORMIN) 100 MG tablet TAKE 1 TABLET(100 MG) BY MOUTH TWICE DAILY 180 tablet 3   Blood Glucose Monitoring Suppl (ONE TOUCH ULTRA MINI) w/Device KIT Use to check blood sugar 2 times a day. 1 kit 0   clobetasol cream (TEMOVATE) 05.28% Apply 1 application topically 2 (two) times daily. 30 g 1   clonazePAM (KLONOPIN) 0.5 MG tablet TAKE 1/2 TABLET(0.25 MG) BY MOUTH DAILY 15 tablet 0   Continuous Blood Gluc Receiver (FREESTYLE LIBRE 14 DAY READER) DEVI For continuous blood glucose monitoring. 1 each 0   Continuous Blood Gluc Sensor (FREESTYLE LIBRE 14 DAY SENSOR) MISC Use for continuous glucose monitoring. 6 each 3   Digestive Enzymes (ENZYME DIGEST PO) Take by mouth.     fexofenadine (ALLEGRA) 180 MG tablet Take 180 mg by mouth daily.     fluvastatin XL (LESCOL XL) 80 MG 24 hr tablet TAKE 1 TABLET BY MOUTH EVERY DAY 30 tablet 0   glipiZIDE (GLUCOTROL) 5 MG tablet TAKE 2 tab 2x a day before lunch and dinner (Patient not taking: Reported on 04/07/2021) 360 tablet 3   glucose blood (ONETOUCH ULTRA) test strip Use to check blood sugar once a day. 100 strip 11   insulin aspart (NOVOLOG FLEXPEN) 100 UNIT/ML FlexPen Inject 10-30 Units into the skin 3 (three) times daily before meals. 90 mL 3   insulin glargine (LANTUS SOLOSTAR) 100 UNIT/ML Solostar Pen Inject 25 Units into the skin at bedtime. 30 mL 3   Insulin Pen Needle 32G X 4 MM MISC Use 4 times a day to give insulin. 400 each  3   irbesartan (AVAPRO) 75 MG tablet TAKE 1 TABLET(75 MG) BY  MOUTH DAILY 90 tablet 3   levothyroxine (SYNTHROID) 75 MCG tablet TAKE 1 TABLET(75 MCG) BY MOUTH DAILY 90 tablet 1   lidocaine (XYLOCAINE) 2 % jelly Apply 1 application topically as needed. 30 mL 4   methocarbamol (ROBAXIN) 500 MG tablet TAKE 1 TABLET(500 MG) BY MOUTH EVERY 8 HOURS AS NEEDED FOR MUSCLE SPASMS 60 tablet 1   naphazoline-pheniramine (NAPHCON-A) 0.025-0.3 % ophthalmic solution Place 1 drop into both eyes 2 (two) times daily.     norelgestromin-ethinyl estradiol (ORTHO EVRA) 150-35 MCG/24HR transdermal patch Place patch onto the skin once weekly continuously. 12 patch 4   OVER THE COUNTER MEDICATION 1 capsule daily. Probiotic renew life      OVER THE COUNTER MEDICATION      sertraline (ZOLOFT) 100 MG tablet TAKE 2 TABLETS(200 MG) BY MOUTH DAILY 180 tablet 0   [DISCONTINUED] EFFEXOR XR 150 MG 24 hr capsule 375 mg daily.      No current facility-administered medications on file prior to visit.   Allergies  Allergen Reactions   Latex Itching and Swelling   Fluticasone     Nose bleeds   Metformin And Related     GI side effects   Pravastatin     myalgias   Family History  Problem Relation Age of Onset   Bipolar disorder Mother    Hypertension Mother    Diabetes Mother    Alzheimer's disease Father    Hypertension Father    Alzheimer's disease Maternal Grandmother    Heart disease Paternal Grandfather     PE: There were no vitals taken for this visit. There is no height or weight on file to calculate BMI.  Wt Readings from Last 3 Encounters:  04/07/21 174 lb 8 oz (79.2 kg)  02/06/21 173 lb 12.8 oz (78.8 kg)  11/08/20 172 lb 9.6 oz (78.3 kg)   Constitutional: overweight, in NAD Eyes: PERRLA, EOMI, no exophthalmos ENT: moist mucous membranes, no thyromegaly, no cervical lymphadenopathy Cardiovascular: RRR, No MRG Respiratory: CTA B Gastrointestinal: abdomen soft, NT, ND, BS+ Musculoskeletal: no deformities, strength intact in all 4 Skin: moist, warm, no  rashes Neurological: no tremor with outstretched hands, DTR normal in all 4  ASSESSMENT: 1. DM2, insulin-dependent, fairly well controlled, without long term complications, but with hyperglycemia  2. HL - Very high LDL - high HDL - slightly high TG  3.  Hypothyroidism  PLAN:  1. DM2 - Patient with well-controlled diabetes previously, but worse in the last few years due to binge eating when stressed.  She also has many medication intolerances, which limit our capacity to treat her diabetes.  She could not tolerate metformin.  She is currently on a basal-bolus insulin regimen, which she adjusts based on her meals.  Her sugars starting to improve after she obtained the CGM.  At last visit, sugars are mostly fluctuating within the normal range.  She had higher blood sugars after dinner and after snacks at night and discussed about improving these.  She was taking quite a high dose of NovoLog between these, up to 36 units.  Since last visit, she tells me that she did eliminate ice cream.  However, she is still eating muffins and candy. CGM interpretation: -At today's visit, we reviewed her CGM downloads: It appears that 77% of values are in target range (goal >70%), while 90% are higher than 180 (goal <25%), and 4% are lower than 70 (goal <4%).  The calculated average blood sugar is 144.  The projected HbA1c for the next 3 months (GMI) is 6.8%. -Reviewing the CGM trends, sugars appear to be better overnight, with a decrease between 12 AM and 3 AM and she tells me that she is actually using a lower dose of Lantus as recommended at last visit.  In the stating that this decrease overnight, I advised her to continue with this dose, but since sugars increase throughout the dinner and are especially higher after dinner, we discussed about possibly moving Lantus to morning.  She does a good job adjusting the dose of NovoLog based on the size and the consistency of her meals and snacks.  We will continue this  but I strongly advised her to try to stop can eat at least but ideally also the rest of the sweets. - I advised her to:  Patient Instructions  Please change: - Lantus 20 units in am  Continue: - Novolog 10-25 units before meals  Please return in  4 months.  - we checked her HbA1c:  5.9% (lower) - advised to check sugars at different times of the day - 4x a day, rotating check times - advised for yearly eye exams >> she is UTD - return to clinic in 4 months  2. HL -Reviewed latest lipid panel from 01/2021: LDL was very high, triglycerides were also high: Lab Results  Component Value Date   CHOL 293 (H) 02/06/2021   HDL 105.60 02/06/2021   LDLCALC 152 (H) 02/06/2021   LDLDIRECT 205.0 01/24/2016   TRIG 175.0 (H) 02/06/2021   CHOLHDL 3 02/06/2021  -She could not tolerate Livalo.  She was on fluvastatin XL,  tolerated well but she was not taking it consistently.  We discussed about the importance of taking it daily.  At last visit, I suggested to add Zetia, but she declined as she read that this can cause diarrhea. -At today's visit, I refilled simvastatin, of which she was out -She declined a referral to the lipid clinic for PCSK9 inhibitors  3.   Hypothyroidism - latest thyroid labs reviewed with pt. >> normal: Lab Results  Component Value Date   TSH 2.02 02/06/2021  - she continues on LT4 75 mcg daily - pt feels good on this dose. - we discussed about taking the thyroid hormone every day, with water, >30 minutes before breakfast, separated by >4 hours from acid reflux medications, calcium, iron, multivitamins. Pt. is taking it correctly.  + Flu shot today  Philemon Kingdom, MD PhD [facility]

## 2021-06-15 NOTE — Patient Instructions (Addendum)
Please change: - Lantus 20 units in am  Continue: - Novolog 10-25 units before meals  Please return in  4 months.

## 2021-06-21 DIAGNOSIS — H2513 Age-related nuclear cataract, bilateral: Secondary | ICD-10-CM | POA: Diagnosis not present

## 2021-06-21 DIAGNOSIS — E113291 Type 2 diabetes mellitus with mild nonproliferative diabetic retinopathy without macular edema, right eye: Secondary | ICD-10-CM | POA: Diagnosis not present

## 2021-06-21 DIAGNOSIS — H5213 Myopia, bilateral: Secondary | ICD-10-CM | POA: Diagnosis not present

## 2021-06-21 LAB — HM DIABETES EYE EXAM

## 2021-06-22 ENCOUNTER — Other Ambulatory Visit (HOSPITAL_COMMUNITY): Payer: Self-pay

## 2021-06-22 MED ORDER — SERTRALINE HCL 100 MG PO TABS: ORAL_TABLET | ORAL | 0 refills | Status: DC

## 2021-06-23 ENCOUNTER — Other Ambulatory Visit: Payer: Self-pay | Admitting: Internal Medicine

## 2021-07-14 ENCOUNTER — Encounter: Payer: Self-pay | Admitting: Family

## 2021-07-14 NOTE — Progress Notes (Signed)
Eye exam placed in scan for [facility]

## 2021-07-19 ENCOUNTER — Encounter: Payer: Self-pay | Admitting: Emergency Medicine

## 2021-07-25 ENCOUNTER — Ambulatory Visit: Payer: Self-pay | Admitting: Obstetrics & Gynecology

## 2021-07-31 ENCOUNTER — Telehealth: Payer: Self-pay | Admitting: Internal Medicine

## 2021-07-31 NOTE — Telephone Encounter (Signed)
Called and confirmed with pharmacy rx that was sent 05/02/21 for 90 day supply with 3 refills should be on file. Pharmacy confirmed rx was in system and would be filled.

## 2021-07-31 NOTE — Telephone Encounter (Signed)
MEDICATION: Continuous Blood Gluc Sensor (FREESTYLE LIBRE 14 DAY SENSOR) MISC  PHARMACY:   [facility] [phone] - [place], [place] - [address] AT [facility] [address] & [address] Phone:  [phone]  Fax:  [phone]      HAS THE PATIENT CONTACTED THEIR PHARMACY?  Yes-requires new RX  IS THIS A 90 DAY SUPPLY : Yes  IS PATIENT OUT OF MEDICATION: No  IF NOT; HOW MUCH IS LEFT: Approx. 2 days  LAST APPOINTMENT DATE: @10 /03/2021  NEXT APPOINTMENT DATE:@2 /10/2021  DO WE HAVE YOUR PERMISSION TO LEAVE A DETAILED MESSAGE?: Yes  OTHER COMMENTS:    **Let patient know to contact pharmacy at the end of the day to make sure medication is ready. **  ** Please notify patient to allow 48-72 hours to process**  **Encourage patient to contact the pharmacy for refills or they can request refills through [facility]**

## 2021-08-05 ENCOUNTER — Other Ambulatory Visit: Payer: Self-pay | Admitting: Internal Medicine

## 2021-08-08 ENCOUNTER — Encounter: Payer: HMO | Admitting: Family

## 2021-08-15 ENCOUNTER — Encounter: Payer: Self-pay | Admitting: Family

## 2021-08-15 ENCOUNTER — Other Ambulatory Visit: Payer: Self-pay

## 2021-08-15 ENCOUNTER — Ambulatory Visit (INDEPENDENT_AMBULATORY_CARE_PROVIDER_SITE_OTHER): Payer: HMO | Admitting: Family

## 2021-08-15 VITALS — BP 130/80 | HR 55 | Temp 98.0°F | Ht 61.0 in | Wt 170.0 lb

## 2021-08-15 DIAGNOSIS — Z0000 Encounter for general adult medical examination without abnormal findings: Secondary | ICD-10-CM

## 2021-08-15 DIAGNOSIS — Z1231 Encounter for screening mammogram for malignant neoplasm of breast: Secondary | ICD-10-CM | POA: Diagnosis not present

## 2021-08-15 DIAGNOSIS — I10 Essential (primary) hypertension: Secondary | ICD-10-CM

## 2021-08-15 MED ORDER — IRBESARTAN 75 MG PO TABS: ORAL_TABLET | ORAL | 3 refills | Status: DC

## 2021-08-15 MED ORDER — ATENOLOL 100 MG PO TABS: 100.0000 mg | ORAL_TABLET | Freq: Two times a day (BID) | ORAL | 3 refills | Status: DC

## 2021-08-15 NOTE — Patient Instructions (Signed)
Your Cologuard will be due again in 2024;  Please get your Shingrix at the pharmacy as discussed; it is a 2 shot series over 2-6 months;

## 2021-08-15 NOTE — Progress Notes (Signed)
[name] is a 55 y.o. female with the following history as recorded in EpicCare:  Patient Active Problem List   Diagnosis Date Noted   Class 1 obesity 12/22/2019   ASCUS with positive high risk HPV cervical 03/13/2017   Type 2 diabetes mellitus with hyperglycemia, without long-term current use of insulin ([place]) 03/28/2016   Routine general medical examination at a health care facility 01/24/2016   Acquired hypothyroidism 01/24/2016   Anemia 04/14/2014   Hyperlipidemia 08/18/2013   Essential hypertension 08/26/2012   Irregular menses 08/07/2012   History of abnormal Pap smear 08/07/2012   GERD (gastroesophageal reflux disease) 08/07/2012   Asthma, mild persistent 08/07/2012   Migraine 08/07/2012   History of PCOS 08/07/2012   Genital warts 08/07/2012   S/P cholecystectomy 08/07/2012   Seasonal and perennial allergic rhinitis 08/07/2012   Severe episode of recurrent major depressive disorder ([place]) 12/06/2011   PTSD (post-traumatic stress disorder) 11/02/2011    Current Outpatient Medications  Medication Sig Dispense Refill   albuterol (VENTOLIN HFA) 108 (90 Base) MCG/ACT inhaler INHALE 2 PUFFS BY MOUTH EVERY 4 TO 6 HOURS AS DIRECTED(RESCUE) 42.5 g 0   Alpha-Lipoic Acid 600 MG CAPS Take 300 mg by mouth 2 (two) times daily.      BD PEN NEEDLE NANO 2ND GEN 32G X 4 MM MISC USE 4 TIMES DAILY 400 each 3   Blood Glucose Monitoring Suppl (ONE TOUCH ULTRA MINI) w/Device KIT Use to check blood sugar 2 times a day. 1 kit 0   clobetasol cream (TEMOVATE) 0.25 % Apply 1 application topically 2 (two) times daily. 30 g 1   clonazePAM (KLONOPIN) 0.5 MG tablet TAKE 1/2 TABLET(0.25 MG) BY MOUTH DAILY 15 tablet 0   Continuous Blood Gluc Sensor (FREESTYLE LIBRE 14 DAY SENSOR) MISC Use for continuous glucose monitoring. 6 each 3   Digestive Enzymes (ENZYME DIGEST PO) Take by mouth.     fexofenadine (ALLEGRA) 180 MG tablet Take 180 mg by mouth daily.     fluvastatin XL (LESCOL XL) 80 MG 24 hr tablet  Take 1 tablet (80 mg total) by mouth daily. 90 tablet 3   glucose blood (ONETOUCH ULTRA) test strip Use to check blood sugar once a day. 100 strip 11   insulin aspart (NOVOLOG FLEXPEN) 100 UNIT/ML FlexPen Inject 10-30 Units into the skin 3 (three) times daily before meals. 90 mL 3   insulin glargine (LANTUS SOLOSTAR) 100 UNIT/ML Solostar Pen Inject 25 Units into the skin at bedtime. 30 mL 3   levothyroxine (SYNTHROID) 75 MCG tablet TAKE 1 TABLET(75 MCG) BY MOUTH DAILY 90 tablet 1   lidocaine (XYLOCAINE) 2 % jelly Apply 1 application topically as needed. 30 mL 4   methocarbamol (ROBAXIN) 500 MG tablet TAKE 1 TABLET(500 MG) BY MOUTH EVERY 8 HOURS AS NEEDED FOR MUSCLE SPASMS 60 tablet 1   naphazoline-pheniramine (NAPHCON-A) 0.025-0.3 % ophthalmic solution Place 1 drop into both eyes 2 (two) times daily.     norelgestromin-ethinyl estradiol (ORTHO EVRA) 150-35 MCG/24HR transdermal patch Place patch onto the skin once weekly continuously. 12 patch 4   OVER THE COUNTER MEDICATION 1 capsule daily. Probiotic renew life      OVER THE COUNTER MEDICATION      sertraline (ZOLOFT) 100 MG tablet TAKE 2 TABLETS(200 MG) BY MOUTH DAILY 180 tablet 0   atenolol (TENORMIN) 100 MG tablet Take 1 tablet (100 mg total) by mouth 2 (two) times daily. 180 tablet 3   irbesartan (AVAPRO) 75 MG tablet TAKE 1 TABLET(75  MG) BY MOUTH DAILY 90 tablet 3   No current facility-administered medications for this visit.    Allergies: Latex, Fluticasone, Metformin and related, and Pravastatin  Past Medical History:  Diagnosis Date   Abnormal Papanicolaou smear of cervix with positive human papilloma virus (HPV) test 02/2017   Anxiety    Asthma    Depression    Diabetes mellitus type II    Food intolerance    Headache    High blood pressure    IBS (irritable bowel syndrome)    Insomnia    Migraines    PCOS (polycystic ovarian syndrome)    PTSD (post-traumatic stress disorder)    Seasonal allergies    Thyroid disease      Past Surgical History:  Procedure Laterality Date   CHOLECYSTECTOMY     GALLBLADDER SURGERY      Family History  Problem Relation Age of Onset   Bipolar disorder Mother    Hypertension Mother    Diabetes Mother    Alzheimer's disease Father    Hypertension Father    Alzheimer's disease Maternal Grandmother    Heart disease Paternal Grandfather     Social History   Tobacco Use   Smoking status: Never   Smokeless tobacco: Never  Substance Use Topics   Alcohol use: Not Currently    Alcohol/week: 0.5 standard drinks    Types: 1 Standard drinks or equivalent per week    Subjective:  Presents for yearly CPE today; no acute concerns; continuing to work with her endocrinologist, GYN and psychiatrist; Sees dentist and eye doctor regularly;  Overdue for mammogram- agrees to schedule;   Review of Systems  Constitutional: Negative.   HENT: Negative.    Eyes: Negative.   Respiratory: Negative.    Cardiovascular: Negative.   Gastrointestinal: Negative.   Genitourinary: Negative.   Musculoskeletal: Negative.   Skin: Negative.   Neurological: Negative.   Endo/Heme/Allergies: Negative.   Psychiatric/Behavioral: Negative.           Objective:  Vitals:   08/15/21 1031 08/15/21 1101  BP: (!) 142/60 130/80  Pulse: (!) 55   Temp: 98 F (36.7 C)   TempSrc: Oral   SpO2: 96%   Weight: 170 lb (77.1 kg)   Height: 5' 1" (1.549 m)     General: Well developed, well nourished, in no acute distress  Skin : Warm and dry.  Head: Normocephalic and atraumatic  Eyes: Sclera and conjunctiva clear; pupils round and reactive to light; extraocular movements intact  Ears: External normal; canals clear; tympanic membranes normal  Oropharynx: Pink, supple. No suspicious lesions  Neck: Supple without thyromegaly, adenopathy  Lungs: Respirations unlabored; clear to auscultation bilaterally without wheeze, rales, rhonchi  CVS exam: normal rate and regular rhythm.  Abdomen: Soft; nontender;  nondistended; normoactive bowel sounds; no masses or hepatosplenomegaly  Musculoskeletal: No deformities; no active joint inflammation  Extremities: No edema, cyanosis, clubbing  Vessels: Symmetric bilaterally  Neurologic: Alert and oriented; speech intact; face symmetrical; moves all extremities well; CNII-XII intact without focal deficit  Assessment:  1. PE (physical exam), annual   2. Essential hypertension   3. Visit for screening mammogram     Plan:   Age appropriate preventive healthcare needs addressed; encouraged regular eye doctor and dental exams; encouraged regular exercise; will update labs and refills as needed today; follow-up to be determined; She will plan to get Shingrix at her pharmacy; Mammogram order updated;  Keep planned follow up with specialists; Follow up here in 1 year, sooner  prn.   This visit occurred during the SARS-CoV-2 public health emergency.  Safety protocols were in place, including screening questions prior to the visit, additional usage of staff PPE, and extensive cleaning of exam room while observing appropriate contact time as indicated for disinfecting solutions.    No follow-ups on file.  Orders Placed This Encounter  Procedures   MM Digital Screening    Standing Status:   Future    Standing Expiration Date:   08/15/2022    Order Specific Question:   Reason for Exam (SYMPTOM  OR DIAGNOSIS REQUIRED)    Answer:   screening mammogram    Order Specific Question:   Is the patient pregnant?    Answer:   No    Order Specific Question:   Preferred imaging location?    Answer:   [facility]   Cologuard    This external order was created through the Results Console.    Requested Prescriptions   Signed Prescriptions Disp Refills   atenolol (TENORMIN) 100 MG tablet 180 tablet 3    Sig: Take 1 tablet (100 mg total) by mouth 2 (two) times daily.   irbesartan (AVAPRO) 75 MG tablet 90 tablet 3    Sig: TAKE 1 TABLET(75 MG) BY MOUTH DAILY

## 2021-08-23 ENCOUNTER — Ambulatory Visit
Admission: RE | Admit: 2021-08-23 | Discharge: 2021-08-23 | Disposition: A | Discharge status: Home / Self Care | Payer: HMO | Source: Ambulatory Visit | Attending: Family | Admitting: Family

## 2021-08-23 ENCOUNTER — Other Ambulatory Visit: Payer: Self-pay | Admitting: Family

## 2021-08-23 ENCOUNTER — Other Ambulatory Visit: Payer: Self-pay

## 2021-08-23 DIAGNOSIS — Z1231 Encounter for screening mammogram for malignant neoplasm of breast: Secondary | ICD-10-CM

## 2021-09-04 ENCOUNTER — Telehealth: Payer: Self-pay | Admitting: Family

## 2021-09-04 NOTE — Telephone Encounter (Signed)
Pt's case manager has been unable to get in contact with pt and wanted to leave her phone number with pcp. Her name is Artemio Aly and she can be reached at [phone].

## 2021-09-04 NOTE — Telephone Encounter (Signed)
I have tried to call pt with no success. I did leave a VM to call back.   I have also called the case manger to ask her what additional information she would like to relay? She stated that she just needed a follow up on her chronic conditions and I stated understanding.   FYI to provider. Unable to contact pt.

## 2021-09-06 ENCOUNTER — Telehealth (HOSPITAL_COMMUNITY): Payer: HMO | Admitting: Psychiatry

## 2021-09-16 ENCOUNTER — Other Ambulatory Visit (HOSPITAL_COMMUNITY): Payer: Self-pay | Admitting: Psychiatry

## 2021-09-16 ENCOUNTER — Other Ambulatory Visit: Payer: Self-pay | Admitting: Family

## 2021-09-25 ENCOUNTER — Ambulatory Visit (INDEPENDENT_AMBULATORY_CARE_PROVIDER_SITE_OTHER): Payer: HMO | Admitting: Obstetrics & Gynecology

## 2021-09-25 ENCOUNTER — Other Ambulatory Visit: Payer: Self-pay

## 2021-09-25 ENCOUNTER — Other Ambulatory Visit (HOSPITAL_COMMUNITY)
Admission: RE | Admit: 2021-09-25 | Discharge: 2021-09-25 | Disposition: A | Discharge status: Home / Self Care | Payer: HMO | Source: Ambulatory Visit | Attending: Obstetrics & Gynecology | Admitting: Obstetrics & Gynecology

## 2021-09-25 ENCOUNTER — Encounter: Payer: Self-pay | Admitting: Obstetrics & Gynecology

## 2021-09-25 VITALS — BP 144/90 | HR 57 | Ht 60.5 in | Wt 171.0 lb

## 2021-09-25 DIAGNOSIS — Z01419 Encounter for gynecological examination (general) (routine) without abnormal findings: Secondary | ICD-10-CM

## 2021-09-25 DIAGNOSIS — E6609 Other obesity due to excess calories: Secondary | ICD-10-CM | POA: Diagnosis not present

## 2021-09-25 DIAGNOSIS — Z9189 Other specified personal risk factors, not elsewhere classified: Secondary | ICD-10-CM | POA: Diagnosis not present

## 2021-09-25 DIAGNOSIS — Z3045 Encounter for surveillance of transdermal patch hormonal contraceptive device: Secondary | ICD-10-CM

## 2021-09-25 DIAGNOSIS — Z6832 Body mass index (BMI) 32.0-32.9, adult: Secondary | ICD-10-CM

## 2021-09-25 MED ORDER — NORELGESTROMIN-ETH ESTRADIOL 150-35 MCG/24HR TD PTWK: MEDICATED_PATCH | TRANSDERMAL | 4 refills | Status: DC

## 2021-09-25 NOTE — Progress Notes (Signed)
[name] Jul 16, 1966 [id number]   History:    55 y.o.  G0 Single   RP:  Established patient presenting for annual gyn exam    HPI: Well on continuous Xulane.  Light menses occasionally, if forgets to switch patch on time.  No pelvic pain.  Abstinent.  Pap Neg in 2020.  Breasts normal.  Mammo 08/2021 Neg.  Urine/BMs normal.  BMI 32.85. Walks her dogs occasionally. Health labs with Fam MD. On Atenolol for cHTN. DM type 2 now on Insulin.  [place] 2015.   Past medical history,surgical history, family history and social history were all reviewed and documented in the EPIC chart.  Gynecologic History No LMP recorded. (Menstrual status: Perimenopausal).  Obstetric History OB History  Gravida Para Term Preterm AB Living  0 0 0 0 0 0  SAB IAB Ectopic Multiple Live Births  0 0 0 0       ROS: A ROS was performed and pertinent positives and negatives are included in the history.  GENERAL: No fevers or chills. HEENT: No change in vision, no earache, sore throat or sinus congestion. NECK: No pain or stiffness. CARDIOVASCULAR: No chest pain or pressure. No palpitations. PULMONARY: No shortness of breath, cough or wheeze. GASTROINTESTINAL: No abdominal pain, nausea, vomiting or diarrhea, melena or bright red blood per rectum. GENITOURINARY: No urinary frequency, urgency, hesitancy or dysuria. MUSCULOSKELETAL: No joint or muscle pain, no back pain, no recent trauma. DERMATOLOGIC: No rash, no itching, no lesions. ENDOCRINE: No polyuria, polydipsia, no heat or cold intolerance. No recent change in weight. HEMATOLOGICAL: No anemia or easy bruising or bleeding. NEUROLOGIC: No headache, seizures, numbness, tingling or weakness. PSYCHIATRIC: No depression, no loss of interest in normal activity or change in sleep pattern.     Exam:   BP (!) 144/90    Pulse (!) 57    Ht 5' 0.5" (1.537 m)    Wt 171 lb (77.6 kg)    SpO2 97%    BMI 32.85 kg/m   Body mass index is 32.85 kg/m.  General appearance :  Well developed well nourished female. No acute distress HEENT: Eyes: no retinal hemorrhage or exudates,  Neck supple, trachea midline, no carotid bruits, no thyroidmegaly Lungs: Clear to auscultation, no rhonchi or wheezes, or rib retractions  Heart: Regular rate and rhythm, no murmurs or gallops Breast:Examined in sitting and supine position were symmetrical in appearance, no palpable masses or tenderness,  no skin retraction, no nipple inversion, no nipple discharge, no skin discoloration, no axillary or supraclavicular lymphadenopathy Abdomen: no palpable masses or tenderness, no rebound or guarding Extremities: no edema or skin discoloration or tenderness  Pelvic: Vulva: Normal             Vagina: No gross lesions or discharge  Cervix: No gross lesions or discharge.  Pap reflex done.  Uterus  AV, normal size, shape and consistency, non-tender and mobile  Adnexa  Without masses or tenderness  Anus: Normal   Assessment/Plan:  56 y.o. female for annual exam   1. Encounter for routine gynecological examination with Papanicolaou smear of cervix Well on continuous Xulane.  Light menses occasionally, if forgets to switch patch on time.  No pelvic pain.  Abstinent.  Pap Neg in 2020.  Breasts normal.  Mammo 08/2021 Neg.  Urine/BMs normal. BMI 32.85. Walks her dogs occasionally. Health labs with Fam MD. On Atenolol for cHTN. DM type 2 now on Insulin.  [place] 2015. - Cytology - PAP( [place])  2.  Encounter for surveillance of transdermal patch hormonal contraceptive device Well on the contraceptive patch continuously.  No complication.  Will check [place]. - FSH; Future  3. Class 1 obesity due to excess calories with serious comorbidity and body mass index (BMI) of 32.0 to 32.9 in adult Recommend a lower calorie/carb diet.  Increase walking.  Other orders - norelgestromin-ethinyl estradiol Marilu Favre) 150-35 MCG/24HR transdermal patch; Place patch onto the skin once weekly continuously.    Princess Bruins MD, 10:53 AM 09/25/2021

## 2021-09-26 LAB — CYTOLOGY - PAP: Diagnosis: NEGATIVE

## 2021-09-27 ENCOUNTER — Telehealth (INDEPENDENT_AMBULATORY_CARE_PROVIDER_SITE_OTHER): Payer: HMO | Admitting: Psychiatry

## 2021-09-27 ENCOUNTER — Encounter (HOSPITAL_COMMUNITY): Payer: Self-pay | Admitting: Psychiatry

## 2021-09-27 DIAGNOSIS — F411 Generalized anxiety disorder: Secondary | ICD-10-CM | POA: Diagnosis not present

## 2021-09-27 DIAGNOSIS — F3341 Major depressive disorder, recurrent, in partial remission: Secondary | ICD-10-CM | POA: Diagnosis not present

## 2021-09-27 DIAGNOSIS — F4310 Post-traumatic stress disorder, unspecified: Secondary | ICD-10-CM

## 2021-09-27 NOTE — Progress Notes (Signed)
Patient ID: [name], female   DOB: 08-Dec-1965, 56 y.o.   MRN: [id number]   [place] Follow-up Outpatient Visit  [name] 03-08-1966 [id number] 56 y.o.  09/27/2021 2:34 PM   Virtual Visit via Telephone Note  I connected with [name] on 09/27/21 at  2:30 PM EST by telephone and verified that I am speaking with the correct person using two identifiers.  Location: Patient: home Provider: home office   I discussed the limitations, risks, security and privacy concerns of performing an evaluation and management service by telephone and the availability of in person appointments. I also discussed with the patient that there may be a patient responsible charge related to this service. The patient expressed understanding and agreed to proceed.       I discussed the assessment and treatment plan with the patient. The patient was provided an opportunity to ask questions and all were answered. The patient agreed with the plan and demonstrated an understanding of the instructions.   The patient was advised to call back or seek an in-person evaluation if the symptoms worsen or if the condition fails to improve as anticipated.  I provided 15 minutes of non-face-to-face time during this encounter.    HPI Comments: [name] is  a 56  y/o female with a past psychiatric history significant for Post traumatic stress Disorder, Major Depressive Disorder, recurrent, severe and GAD.  She is doing fair, BF died of stroke, had parkinsonism  She is moving forward and has more time , space    Learning diabetes control  Modifying factor: reads books, keeps busy  Past Medical Family, Social History:  Past Medical History:  Diagnosis Date   Abnormal Papanicolaou smear of cervix with positive human papilloma virus (HPV) test 02/2017   Anxiety    Asthma    Depression    Diabetes mellitus type II    Food intolerance    Headache    High blood pressure    IBS  (irritable bowel syndrome)    Insomnia    Migraines    PCOS (polycystic ovarian syndrome)    PTSD (post-traumatic stress disorder)    Seasonal allergies    Thyroid disease    Family History  Problem Relation Age of Onset   Bipolar disorder Mother    Hypertension Mother    Diabetes Mother    Alzheimer's disease Father    Hypertension Father    Alzheimer's disease Maternal Grandmother    Heart disease Paternal Grandfather    Social History   Socioeconomic History   Marital status: Single    Spouse name: Not on file   Number of children: 0   Years of education: 9   Highest education level: Master's degree (e.g., MA, MS, MEng, MEd, MSW, MBA)  Occupational History   Occupation: Long Term Disability    Comment: PTSD  Tobacco Use   Smoking status: Never   Smokeless tobacco: Never  Vaping Use   Vaping Use: Never used  Substance and Sexual Activity   Alcohol use: Not Currently    Alcohol/week: 0.5 standard drinks    Types: 1 Standard drinks or equivalent per week   Drug use: No   Sexual activity: Not Currently    Partners: Male    Birth control/protection: Patch    Comment: 1st intercourse- 20, partners- 71, current partner- 16yr  Other Topics Concern   Not on file  Social History Narrative   Regular exercise: yes, walk   Caffeine  use: occasionally tea   Fun: Social group events, walking her dogs, movies   Denies abuse and feels safe where she lives.    Social Determinants of Health   Financial Resource Strain: Not on file  Food Insecurity: Not on file  Transportation Needs: Not on file  Physical Activity: Not on file  Stress: Not on file  Social Connections: Not on file  Intimate Partner Violence: Not on file    Outpatient Encounter Medications as of 09/27/2021  Medication Sig   albuterol (VENTOLIN HFA) 108 (90 Base) MCG/ACT inhaler INHALE 2 PUFFS BY MOUTH EVERY 4 TO 6 HOURS AS DIRECTED(RESCUE)   Alpha-Lipoic Acid 600 MG CAPS Take 300 mg by mouth 2 (two) times  daily.    atenolol (TENORMIN) 100 MG tablet TAKE 1 TABLET(100 MG) BY MOUTH TWICE DAILY   BD PEN NEEDLE NANO 2ND GEN 32G X 4 MM MISC USE 4 TIMES DAILY   Blood Glucose Monitoring Suppl (ONE TOUCH ULTRA MINI) w/Device KIT Use to check blood sugar 2 times a day.   clobetasol cream (TEMOVATE) 5.61 % Apply 1 application topically 2 (two) times daily.   clonazePAM (KLONOPIN) 0.5 MG tablet TAKE 1/2 TABLET(0.25 MG) BY MOUTH DAILY   Continuous Blood Gluc Sensor (FREESTYLE LIBRE 14 DAY SENSOR) MISC Use for continuous glucose monitoring.   Digestive Enzymes (ENZYME DIGEST PO) Take by mouth.   fexofenadine (ALLEGRA) 180 MG tablet Take 180 mg by mouth daily.   fluvastatin XL (LESCOL XL) 80 MG 24 hr tablet Take 1 tablet (80 mg total) by mouth daily.   glucose blood (ONETOUCH ULTRA) test strip Use to check blood sugar once a day.   insulin aspart (NOVOLOG FLEXPEN) 100 UNIT/ML FlexPen Inject 10-30 Units into the skin 3 (three) times daily before meals.   insulin glargine (LANTUS SOLOSTAR) 100 UNIT/ML Solostar Pen Inject 25 Units into the skin at bedtime.   irbesartan (AVAPRO) 75 MG tablet TAKE 1 TABLET(75 MG) BY MOUTH DAILY   levothyroxine (SYNTHROID) 75 MCG tablet TAKE 1 TABLET(75 MCG) BY MOUTH DAILY   lidocaine (XYLOCAINE) 2 % jelly Apply 1 application topically as needed.   methocarbamol (ROBAXIN) 500 MG tablet TAKE 1 TABLET(500 MG) BY MOUTH EVERY 8 HOURS AS NEEDED FOR MUSCLE SPASMS   naphazoline-pheniramine (NAPHCON-A) 0.025-0.3 % ophthalmic solution Place 1 drop into both eyes 2 (two) times daily.   norelgestromin-ethinyl estradiol Marilu Favre) 150-35 MCG/24HR transdermal patch Place patch onto the skin once weekly continuously.   OVER THE COUNTER MEDICATION 1 capsule daily. Probiotic renew life    OVER THE COUNTER MEDICATION    sertraline (ZOLOFT) 100 MG tablet TAKE 2 TABLETS(200 MG) BY MOUTH DAILY   [DISCONTINUED] EFFEXOR XR 150 MG 24 hr capsule 375 mg daily.    No facility-administered encounter  medications on file as of 09/27/2021.     Physical Exam: Constitutional:  There were no vitals taken for this visit.  Review of Systems  Cardiovascular:  Negative for chest pain.  Psychiatric/Behavioral:  Negative for depression, substance abuse and suicidal ideas.       Psychiatric Specialty exam: General Appearance:   Eye Contact::    Speech:  Clear and Coherent and Normal Rate  Volume:  Normal  Mood: fair  Affect:    Thought Process:  Coherent, Linear and Logical  Orientation:  Full (Time, Place, and Person)  Thought Content:  WDL  Suicidal Thoughts:  No  Homicidal Thoughts:  No  Memory:  Immediate;   Good Recent;   Good Remote;  Good  Judgement:  Fair  Insight:  Fair  Psychomotor Activity:  Normal  Concentration:  Good  Recall:  Good  Akathisia:  Negative  Language-Intact  Fund of knowledge-Average  Handed:  Right  AIMS (if indicated):     Assets:  Desire for Improvement Housing  Sleep:  Number of Hours: 1-9 hours     Assessment: Axis I: Post traumatic stress Disorder-Major Depressive Disorder, recurrent, severe- GAd    derstanding of how their medications were to be used.         Prior documentation reviewed   1. Depression: stable, continue zoloft   2. PTSD:baseline, continue zoloft 3. GAD:  handling it fair, remains positive, continue zoloft  Fu 79m Renewed med            NFreddy Jaksch M.D.  09/27/2021 2:34 PM

## 2021-10-04 ENCOUNTER — Telehealth: Payer: Self-pay | Admitting: Internal Medicine

## 2021-10-04 NOTE — Telephone Encounter (Signed)
insulin glargine (LANTUS SOLOSTAR) 100 UNIT/ML Solostar Pen  Pharmacy has informed patient that Provider approval on the above medication is needed. Please contact  [facility] [phone] [place], [place] - [address] AT [facility] [address] [place] Phone:  [phone]  Fax:  [phone]    Please provide confirmation when complete via My Chart.

## 2021-10-05 MED ORDER — LANTUS SOLOSTAR 100 UNIT/ML ~~LOC~~ SOPN: 25.0000 [IU] | PEN_INJECTOR | Freq: Every day | SUBCUTANEOUS | 0 refills | Status: DC

## 2021-10-05 NOTE — Telephone Encounter (Signed)
Script sent  

## 2021-10-18 ENCOUNTER — Ambulatory Visit: Payer: HMO | Admitting: Obstetrics & Gynecology

## 2021-10-19 ENCOUNTER — Other Ambulatory Visit: Payer: Self-pay

## 2021-10-19 ENCOUNTER — Encounter: Payer: Self-pay | Admitting: Internal Medicine

## 2021-10-19 ENCOUNTER — Ambulatory Visit (INDEPENDENT_AMBULATORY_CARE_PROVIDER_SITE_OTHER): Payer: HMO | Admitting: Internal Medicine

## 2021-10-19 VITALS — BP 120/76 | HR 58 | Ht 60.5 in | Wt 170.0 lb

## 2021-10-19 DIAGNOSIS — E039 Hypothyroidism, unspecified: Secondary | ICD-10-CM | POA: Diagnosis not present

## 2021-10-19 DIAGNOSIS — Z794 Long term (current) use of insulin: Secondary | ICD-10-CM | POA: Diagnosis not present

## 2021-10-19 DIAGNOSIS — E1165 Type 2 diabetes mellitus with hyperglycemia: Secondary | ICD-10-CM

## 2021-10-19 DIAGNOSIS — E782 Mixed hyperlipidemia: Secondary | ICD-10-CM | POA: Diagnosis not present

## 2021-10-19 LAB — POCT GLYCOSYLATED HEMOGLOBIN (HGB A1C): Hemoglobin A1C: 6.4 % — AB (ref 4.0–5.6)

## 2021-10-19 NOTE — Progress Notes (Signed)
Patient ID: [name], female   DOB: [birth year], 56 y.o.   MRN: [id number]  This visit occurred during the SARS-CoV-2 public health emergency.  Safety protocols were in place, including screening questions prior to the visit, additional usage of staff PPE, and extensive cleaning of exam room while observing appropriate contact time as indicated for disinfecting solutions.   HPI: [name] is a 56 y.o.-year-old female, returning for f/u for DM2, dx 2013, insulin-dependent, uncontrolled, with complications (diabetic retinopathy) and also hypothyroidism. Last visit 4 months ago.  Interim history: No increased urination, blurry vision, nausea, chest pain. Since last visit, she restarted ice cream, but she again stopped it now.  No sweet drinks.  DM2: Reviewed HbA1c levels: Lab Results  Component Value Date   HGBA1C 5.9 (A) 06/15/2021   HGBA1C 6.7 (A) 02/06/2021   HGBA1C 6.1 (A) 11/08/2020   Pt is on a regimen of: -- Lantus 12 >> ... 20 >> 25 >> 20 units at bedtime  - NovoLog 8-25 units 15 minutes before each meal, 6-12 for snacks We stopped Cycloset in 06/2020. She stopped Rybelsus 7 mg >> stopped 2/2 N and D >> diarrhea stopped She did not start Welchol 2 tabs 2x a day - added 03/2017 - too expensive On Turmeric, alpha-lipoic acid + 660 mcg biotin per day, Garlicin.  She has various intolerances to different diabetic medicines: Metformin ER >> in the past, she had gastric discomfort and severe diarrhea (loss of bowel control).  However, we restarted this 08/2017 and she is tolerating this well. Januvia >> tried for 1-2 months >> left chest pain She was wondering if she could take Invokana, however she had multiple vaginal yeast infections. She Also had nocturia 3-4 x a night.  She had reactive hypoglycemia symptoms while on Quercetin (was taking it for allergies) >> we stopped this it is known to increase insulin production by the pancreas.  She checks her sugars more than 4  times a day with her libre CGM:   Previously:   Prev.:   Lowest: 68 >> LO x2 >> 60s >> 47 milligram bolus too late). Highest: 455 >> 300s >> 250s >> 350 (mints).  She has many food intolerances: - Gluten >> cannot eat bread or pastry.  - raw vegetables, beans, cruciferous vegetables, fruit >> diarrhea.  - No dairy but, cereals  -at night -now off ice cream In the past, no she was taking up to 36 units of NovoLog for ice cream.  I strongly advised her to stop this in the past. She does not digest fat well after her cholecystectomy in 2011.   Meter: One Touch Mini  No CKD, last BUN/creatinine was:  Lab Results  Component Value Date   BUN 20 02/06/2021   CREATININE 0.84 02/06/2021  On irbesartan.  + HL: Lab Results  Component Value Date   CHOL 293 (H) 02/06/2021   HDL 105.60 02/06/2021   LDLCALC 152 (H) 02/06/2021   LDLDIRECT 205.0 01/24/2016   TRIG 175.0 (H) 02/06/2021   CHOLHDL 3 02/06/2021  Previous cholesterol levels were: 325/197/132/154.   She could not tolerate pravastatin or Livalo due to muscle aches.  She refused a referral to lipid clinic in the past for PCSK9 inhibitors.  At last visit she was taking fluvastatin XL inconsistently.  I advised her to take it daily >> now taking this daily + CoQ10. At last visit I suggested Zetia but she refused due to the possibility of developing diarrhea on it.  -  last dilated eye exam was in 06/2021: + DR. Dr. Lennox Grumbles.   - + Numbness and tingling in her legs.  She is on the R enantiomer of alpha-lipoic acid.  She also has a history of PCOS- she has seen Dr. Barry Dienes at [facility] in the past - note from 08/26/2012: perimenopause, and at that time she was on Yasmin, subsequently changed to [place]. She sees Dr. Vernice Jefferson with OB/GYN. She has PTSD, MDD, insomnia, anxiety, asthma, HTN, GERD, status post cholecystectomy, anemia, transaminitis. She also had increased uterine bleeding (on Necon). She sawan acupuncturist. She takes  Nettle powder >> helped her with diarrhea, anemia, insomnia. 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 Also, Undecylenic acid and mastic gum, no carbs x 1-2 months.  She has IBS >> better on L- plantarum, S.bulardii - Brevibacillus Laterosporus 20 min before b'fast.   Hypothyroidism  -Diagnosed in 02/2018. We started levothyroxine 03/2018.  Pt is on levothyroxine 75 mcg daily (dose increased 03/2020), taken: - in am - fasting - at least 30 min from b'fast - no calcium - no iron - no multivitamins - no PPIs - not on Biotin  Reviewed her TFTs: Lab Results  Component Value Date   TSH 2.02 02/06/2021   TSH 2.82 07/01/2020   TSH 6.44 (H) 03/22/2020   TSH 2.96 04/01/2019   TSH 1.26 07/30/2018   TSH 10.64 (H) 04/04/2018   TSH 4.80 (H) 03/13/2018   TSH 2.91 01/24/2016   TSH 2.322 02/26/2013   TSH 2.050 08/07/2012   FREET4 0.63 02/06/2021   FREET4 0.78 07/01/2020   FREET4 0.91 03/22/2020   FREET4 0.60 04/01/2019   FREET4 0.84 07/30/2018   FREET4 0.72 04/04/2018   T3FREE 2.6 04/04/2018   Her antithyroid antibodies were not elevated: Component     Latest Ref Rng & Units 04/04/2018  Thyroglobulin Ab     < or = 1 IU/mL <1  Thyroperoxidase Ab SerPl-aCnc     <9 IU/mL 2   No FH of thyroid cancer. No h/o radiation tx to head or neck.  No herbal supplements. No Biotin use. No recent steroids use.   Pt denies: - feeling nodules in neck - hoarseness - dysphagia - choking - SOB with lying down  ROS: + See HPI Neurological: no tremors/+ numbness/+ tingling/no dizziness  I reviewed pt's medications, allergies, PMH, social hx, family hx, and changes were documented in the history of present illness. Otherwise, unchanged from my initial visit note.  Past Medical History:  Diagnosis Date   Abnormal Papanicolaou smear of cervix with positive human papilloma virus (HPV) test 02/2017   Anxiety    Asthma    Depression    Diabetes  mellitus type II    Food intolerance    Headache    High blood pressure    IBS (irritable bowel syndrome)    Insomnia    Migraines    PCOS (polycystic ovarian syndrome)    PTSD (post-traumatic stress disorder)    Seasonal allergies    Thyroid disease    Past Surgical History:  Procedure Laterality Date   CHOLECYSTECTOMY     GALLBLADDER SURGERY     Social History   Socioeconomic History   Marital status: Single    Spouse name: Not on file   Number of children: 0   Years of education: 51   Highest education level: Master's degree (e.g., MA, MS, MEng, MEd, MSW, MBA)  Occupational History   Occupation: Long  Term Disability    Comment: PTSD  Tobacco Use   Smoking status: Never   Smokeless tobacco: Never  Vaping Use   Vaping Use: Never used  Substance and Sexual Activity   Alcohol use: Not Currently    Alcohol/week: 0.5 standard drinks    Types: 1 Standard drinks or equivalent per week   Drug use: No   Sexual activity: Not Currently    Partners: Male    Birth control/protection: Patch    Comment: 1st intercourse- 65, partners- 53, current partner- 59yr  Other Topics Concern   Not on file  Social History Narrative   Regular exercise: yes, walk   Caffeine use: occasionally tea   Fun: Social group events, walking her dogs, movies   Denies abuse and feels safe where she lives.    Social Determinants of Health   Financial Resource Strain: Not on file  Food Insecurity: Not on file  Transportation Needs: Not on file  Physical Activity: Not on file  Stress: Not on file  Social Connections: Not on file  Intimate Partner Violence: Not on file   Current Outpatient Medications on File Prior to Visit  Medication Sig Dispense Refill   albuterol (VENTOLIN HFA) 108 (90 Base) MCG/ACT inhaler INHALE 2 PUFFS BY MOUTH EVERY 4 TO 6 HOURS AS DIRECTED(RESCUE) 42.5 g 0   Alpha-Lipoic Acid 600 MG CAPS Take 300 mg by mouth 2 (two) times daily.      atenolol (TENORMIN) 100 MG tablet  TAKE 1 TABLET(100 MG) BY MOUTH TWICE DAILY 180 tablet 3   BD PEN NEEDLE NANO 2ND GEN 32G X 4 MM MISC USE 4 TIMES DAILY 400 each 3   Blood Glucose Monitoring Suppl (ONE TOUCH ULTRA MINI) w/Device KIT Use to check blood sugar 2 times a day. 1 kit 0   clobetasol cream (TEMOVATE) 04.94% Apply 1 application topically 2 (two) times daily. 30 g 1   clonazePAM (KLONOPIN) 0.5 MG tablet TAKE 1/2 TABLET(0.25 MG) BY MOUTH DAILY 15 tablet 0   Continuous Blood Gluc Sensor (FREESTYLE LIBRE 14 DAY SENSOR) MISC Use for continuous glucose monitoring. 6 each 3   Digestive Enzymes (ENZYME DIGEST PO) Take by mouth.     fexofenadine (ALLEGRA) 180 MG tablet Take 180 mg by mouth daily.     fluvastatin XL (LESCOL XL) 80 MG 24 hr tablet Take 1 tablet (80 mg total) by mouth daily. 90 tablet 3   glucose blood (ONETOUCH ULTRA) test strip Use to check blood sugar once a day. 100 strip 11   insulin aspart (NOVOLOG FLEXPEN) 100 UNIT/ML FlexPen Inject 10-30 Units into the skin 3 (three) times daily before meals. 90 mL 3   insulin glargine (LANTUS SOLOSTAR) 100 UNIT/ML Solostar Pen Inject 25 Units into the skin at bedtime. 30 mL 0   irbesartan (AVAPRO) 75 MG tablet TAKE 1 TABLET(75 MG) BY MOUTH DAILY 90 tablet 3   levothyroxine (SYNTHROID) 75 MCG tablet TAKE 1 TABLET(75 MCG) BY MOUTH DAILY 90 tablet 1   lidocaine (XYLOCAINE) 2 % jelly Apply 1 application topically as needed. 30 mL 4   methocarbamol (ROBAXIN) 500 MG tablet TAKE 1 TABLET(500 MG) BY MOUTH EVERY 8 HOURS AS NEEDED FOR MUSCLE SPASMS 60 tablet 1   naphazoline-pheniramine (NAPHCON-A) 0.025-0.3 % ophthalmic solution Place 1 drop into both eyes 2 (two) times daily.     norelgestromin-ethinyl estradiol (Marilu Favre 150-35 MCG/24HR transdermal patch Place patch onto the skin once weekly continuously. 12 patch 4   OVER THE COUNTER MEDICATION 1  capsule daily. Probiotic renew life      OVER THE COUNTER MEDICATION      sertraline (ZOLOFT) 100 MG tablet TAKE 2 TABLETS(200 MG) BY  MOUTH DAILY 180 tablet 0   [DISCONTINUED] EFFEXOR XR 150 MG 24 hr capsule 375 mg daily.      No current facility-administered medications on file prior to visit.   Allergies  Allergen Reactions   Latex Itching and Swelling   Fluticasone     Nose bleeds   Metformin And Related     GI side effects   Pravastatin     myalgias   Family History  Problem Relation Age of Onset   Bipolar disorder Mother    Hypertension Mother    Diabetes Mother    Alzheimer's disease Father    Hypertension Father    Alzheimer's disease Maternal Grandmother    Heart disease Paternal Grandfather     PE: BP 120/76 (BP Location: Right Arm, Patient Position: Sitting, Cuff Size: Normal)    Pulse (!) 58    Ht 5' 0.5" (1.537 m)    Wt 170 lb (77.1 kg)    SpO2 96%    BMI 32.65 kg/m    Wt Readings from Last 3 Encounters:  10/19/21 170 lb (77.1 kg)  09/25/21 171 lb (77.6 kg)  08/15/21 170 lb (77.1 kg)   Constitutional: overweight, in NAD Eyes: PERRLA, EOMI, no exophthalmos ENT: moist mucous membranes, no thyromegaly, no cervical lymphadenopathy Cardiovascular: RRR, No MRG Respiratory: CTA B Musculoskeletal: no deformities, strength intact in all 4 Skin: moist, warm, no rashes Neurological: no tremor with outstretched hands, DTR normal in all 4 Diabetic Foot Exam - Simple   Simple Foot Form Diabetic Foot exam was performed with the following findings: Yes 10/19/2021 10:19 AM  Visual Inspection No deformities, no ulcerations, no other skin breakdown bilaterally: Yes Sensation Testing Intact to touch and monofilament testing bilaterally: Yes Pulse Check Posterior Tibialis and Dorsalis pulse intact bilaterally: Yes Comments    ASSESSMENT: 1. DM2, insulin-dependent, fairly well controlled, with complications - DR  2. HL - Very high LDL - high HDL - slightly high TG  3.  Hypothyroidism  PLAN:  1. DM2 - Patient with insulin-dependent type 2 diabetes, with suboptimal control, mostly due to many  medication intolerance and binge eating disorder., -She is currently on a basal/bolus insulin regimen with a lower dose of Lantus and fluctuating doses of NovoLog. -At last visit, HbA1c was excellent, at 5.9%, but she still had fluctuations in the blood sugars with higher levels after eating sweets or larger meals.  We did not change the regimen at that time but discussed about reducing sweets.  Sugars were better overnight with a decrease between 12 AM and 3 AM and they were increasing in the afternoon and especially after dinner. CGM interpretation: -At today's visit, we reviewed her CGM downloads: It appears that 69% of values are in target range (goal >70%), while 28% are higher than 180 (goal <25%), and 2% are lower than 70 (goal <4%).  The calculated average blood sugar is 153.  The projected HbA1c for the next 3 months (GMI) is 7%. -Reviewing the CGM trends, it appears that her sugars are again fairly well controlled during the night and the first half of the day, but they increase after lunch, and especially after dinner.  Also, they are higher after a snack at night.  They may drop afterwards, around 12 AM, and she went had a blood sugar in the 40s  around that time.  Upon questioning, she took her NovoLog too late.  She is not usually waiting 15 minutes between the NovoLog injection and the meal.  I strongly advised her to do so.  Otherwise, for now, I advised him to continue the same regimen. - I advised her to:  Patient Instructions  Please continue: - Lantus 20 units in am - Novolog 10-25 units before meals  Inject Novolog 15 min before meals.  Please return in  4 months.  - we checked her HbA1c: 6.4% (higher) - advised to check sugars at different times of the day - 4x a day, rotating check times - advised for yearly eye exams >> she is UTD - foot exam performed today - return to clinic in 4 months  2. HL - Reviewed latest lipid panel from 01/2021: LDL very high, triglycerides  also high: Lab Results  Component Value Date   CHOL 293 (H) 02/06/2021   HDL 105.60 02/06/2021   LDLCALC 152 (H) 02/06/2021   LDLDIRECT 205.0 01/24/2016   TRIG 175.0 (H) 02/06/2021   CHOLHDL 3 02/06/2021  -She could not tolerate Livalo.  She was on fluvastatin XL,  tolerated well but she was not taking it consistently.  We discussed about the importance of taking it daily.  I also suggested Zetia but she declined that she read that this could cause diarrhea. -At last visit I refilled her simvastatin -She declined a referral to the lipid clinic for PCSK9 inhibitors  3.   Hypothyroidism - latest thyroid labs reviewed with pt. >> normal: Lab Results  Component Value Date   TSH 2.02 02/06/2021  - she continues on LT4 75 mcg daily - pt feels good on this dose. - we discussed about taking the thyroid hormone every day, with water, >30 minutes before breakfast, separated by >4 hours from acid reflux medications, calcium, iron, multivitamins. Pt. is taking it correctly.  Philemon Kingdom, MD PhD [facility]

## 2021-10-19 NOTE — Patient Instructions (Addendum)
Please continue: - Lantus 20 units in am - Novolog 10-25 units before meals  Inject Novolog 15 min before meals.  Please return in  4 months.

## 2021-10-27 ENCOUNTER — Telehealth (HOSPITAL_COMMUNITY): Payer: Self-pay | Admitting: Psychiatry

## 2021-11-09 ENCOUNTER — Telehealth: Payer: Self-pay | Admitting: Pharmacy Technician

## 2021-11-10 ENCOUNTER — Other Ambulatory Visit (HOSPITAL_COMMUNITY): Payer: Self-pay

## 2021-11-29 ENCOUNTER — Telehealth: Payer: Self-pay

## 2021-11-29 NOTE — Telephone Encounter (Signed)
Leda Min, RN  [facility] Triage ?I received notification from [facility] that her pap letter was returned as undeliverable.  ? ?Per review of her 09/25/21 result note, she has not read her MyChart message. Please try to reach her by telephone.  ? ?Thanks,  ?

## 2021-11-29 NOTE — Telephone Encounter (Signed)
I called patient and per DPR access note on file I left message that her Pap smear result for 09/25/2021 was normal. ?

## 2021-12-05 ENCOUNTER — Other Ambulatory Visit: Payer: Self-pay

## 2021-12-05 DIAGNOSIS — E1165 Type 2 diabetes mellitus with hyperglycemia: Secondary | ICD-10-CM

## 2021-12-05 MED ORDER — NOVOLOG FLEXPEN 100 UNIT/ML ~~LOC~~ SOPN: 10.0000 [IU] | PEN_INJECTOR | Freq: Three times a day (TID) | SUBCUTANEOUS | 1 refills | Status: DC

## 2021-12-05 NOTE — Telephone Encounter (Signed)
Pt requested novolog brand rx be sent to preferred pharmacy.  ?

## 2021-12-08 IMAGING — MG MM DIGITAL SCREENING BILAT W/ TOMO AND CAD
6 of 10 series · 6 of 30 positions shown · non-contrast
Comparison: Previous exam(s).

CLINICAL DATA: Screening.

EXAM:
DIGITAL SCREENING BILATERAL MAMMOGRAM WITH TOMOSYNTHESIS AND CAD
TECHNIQUE: Bilateral screening digital craniocaudal and mediolateral oblique
mammograms were obtained. Bilateral screening digital breast
tomosynthesis was performed. The images were evaluated with
computer-aided detection.

[R MLO synth-2D (1 of 2)]
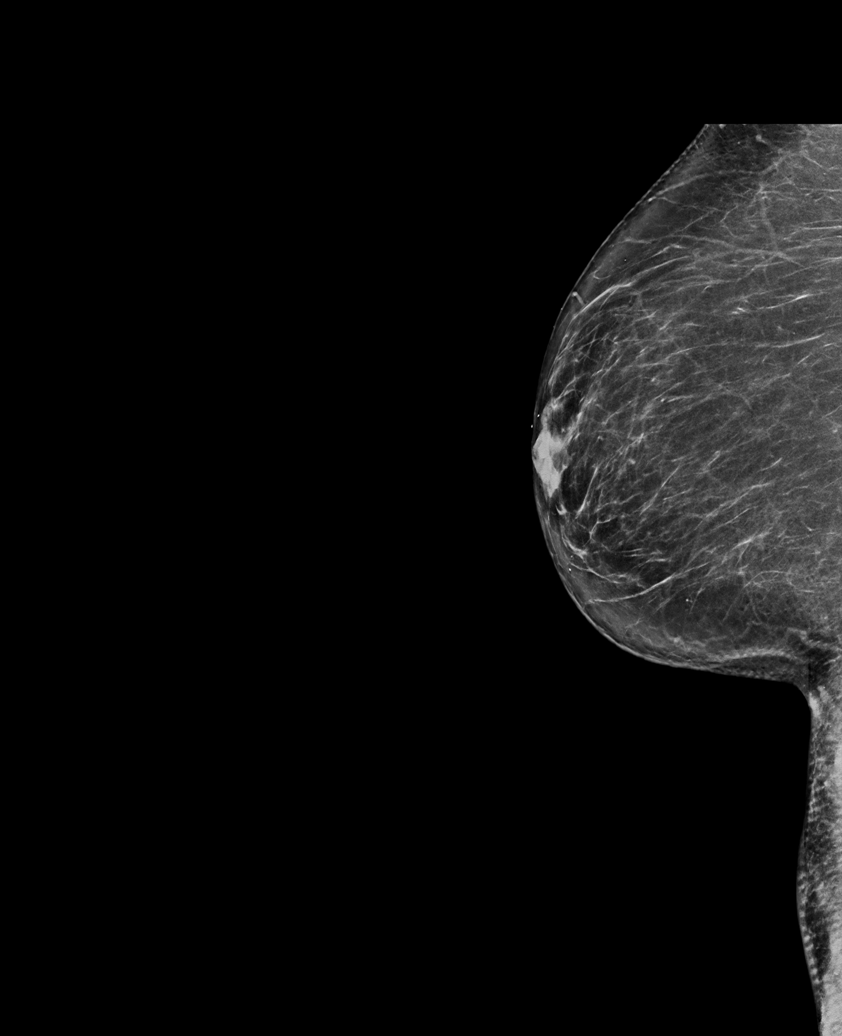

[L CC synth-2D]
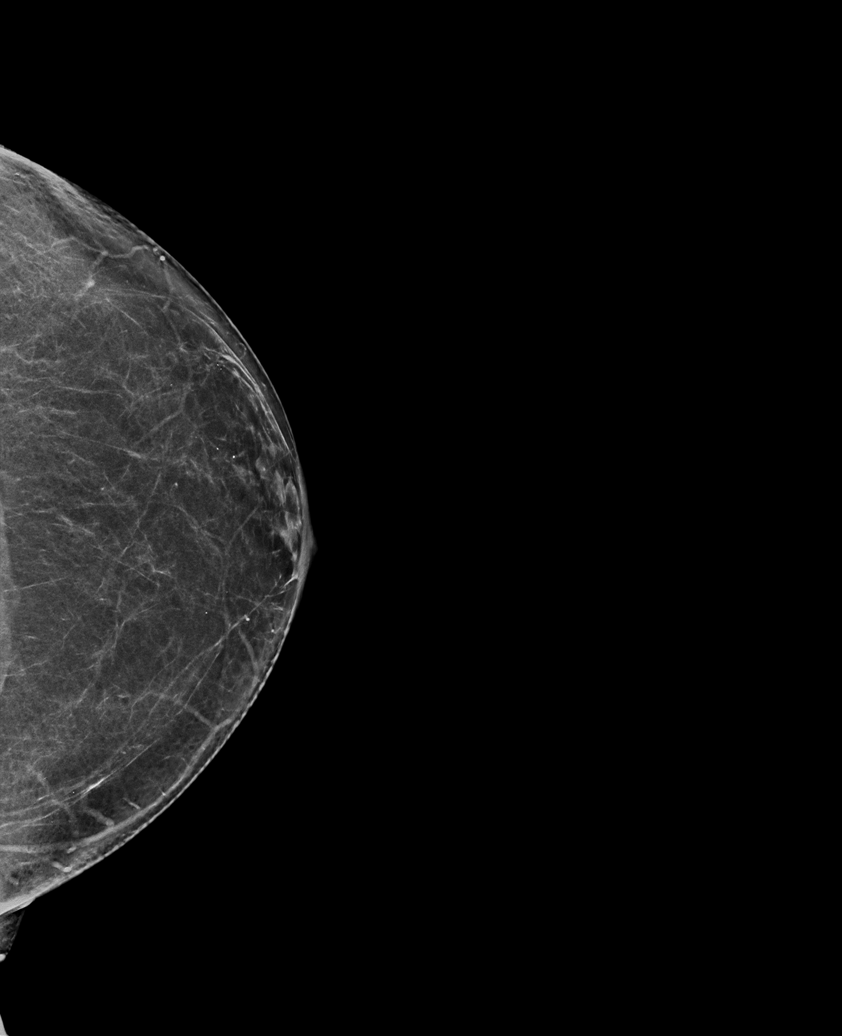

[L MLO synth-2D]
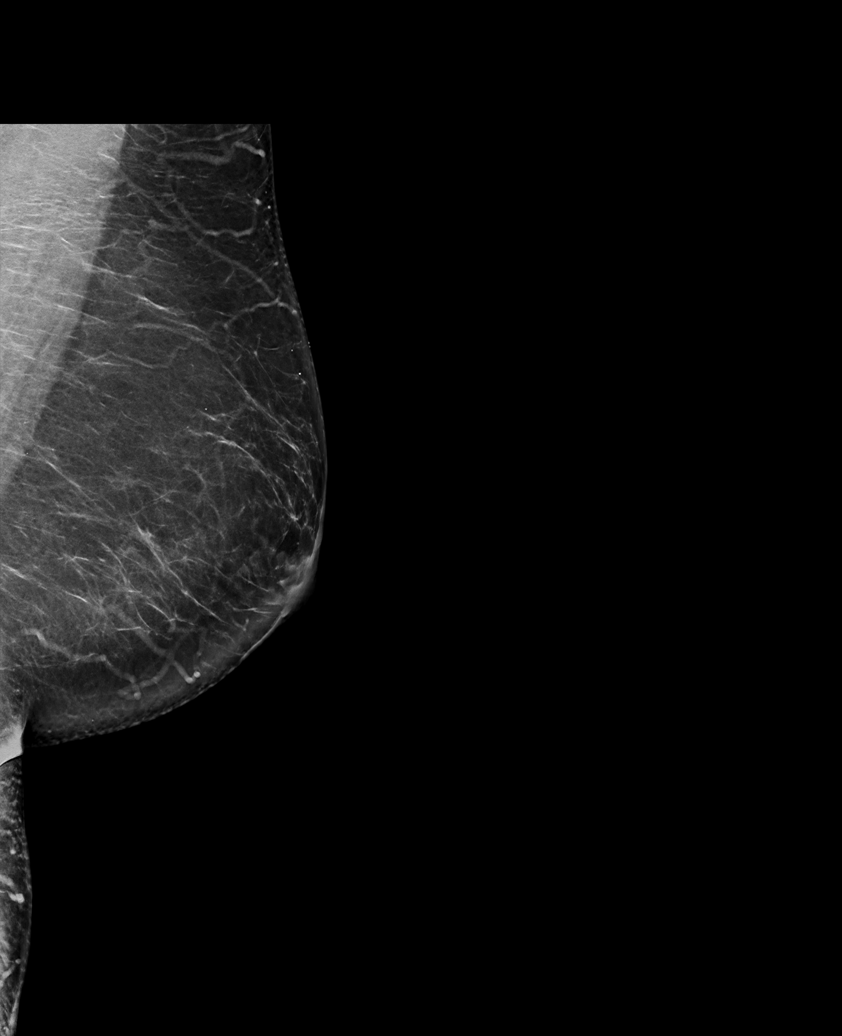

[R MLO synth-2D (2 of 2)]
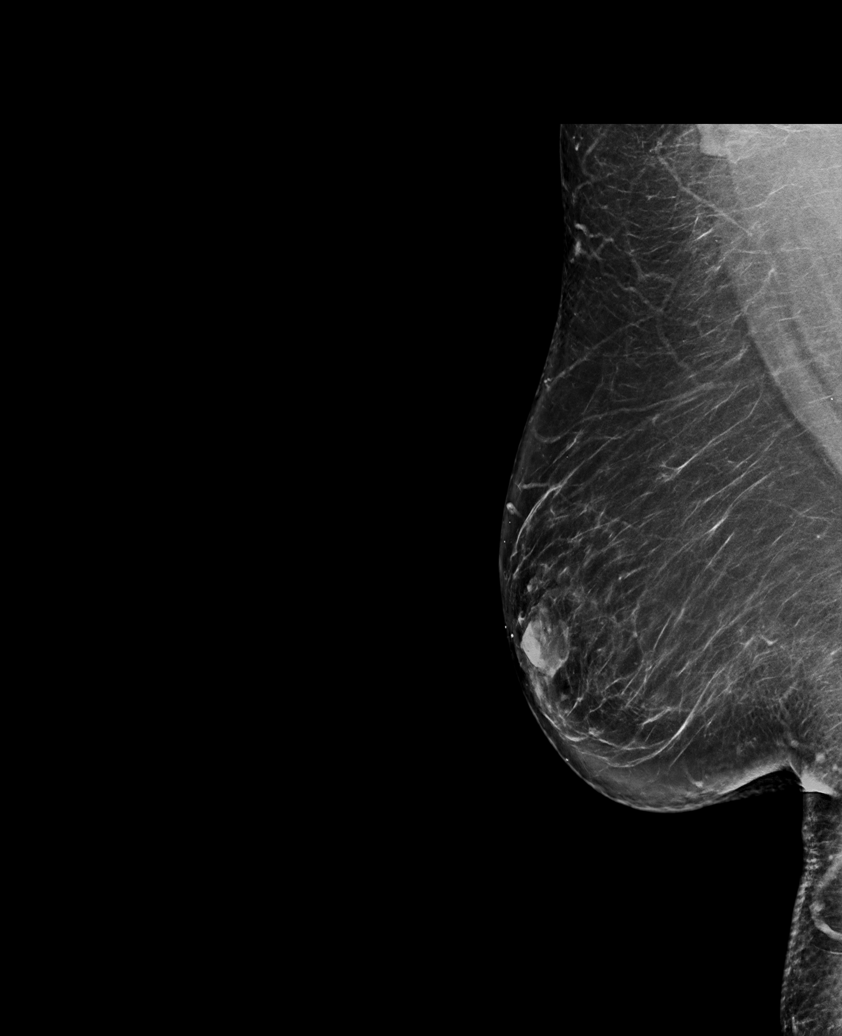

[R CC synth-2D]
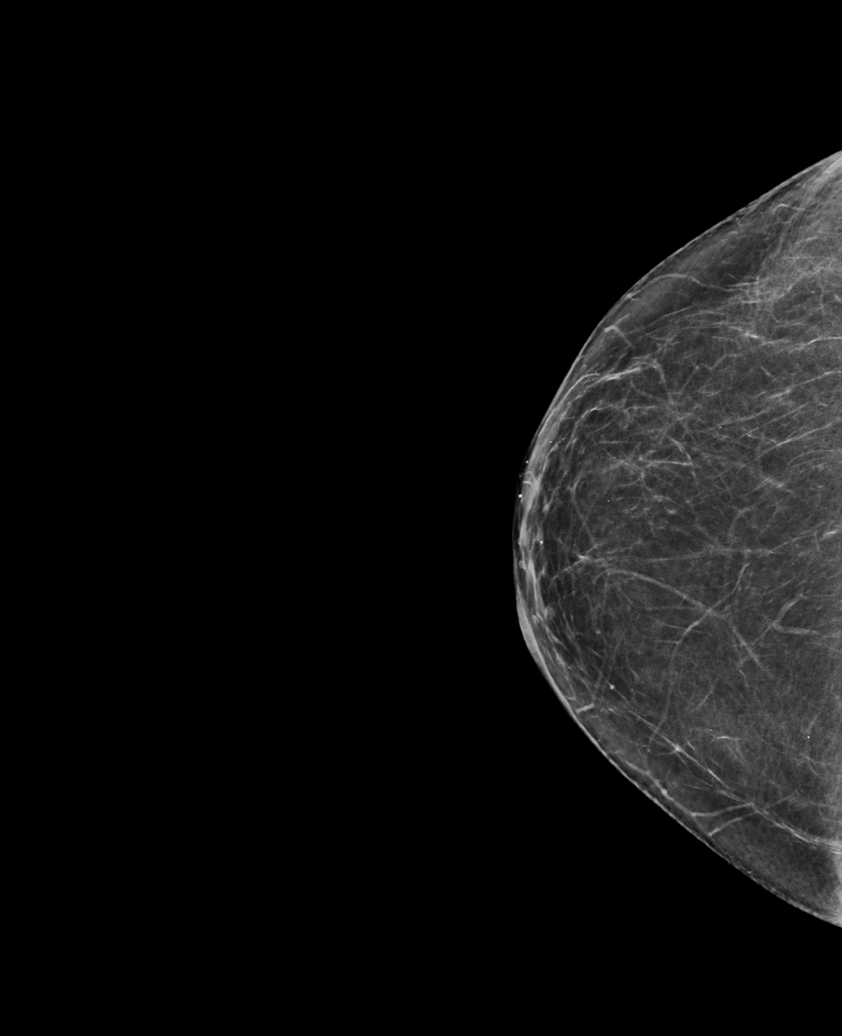

[R CC tomo · tomo slice 34/67.0]
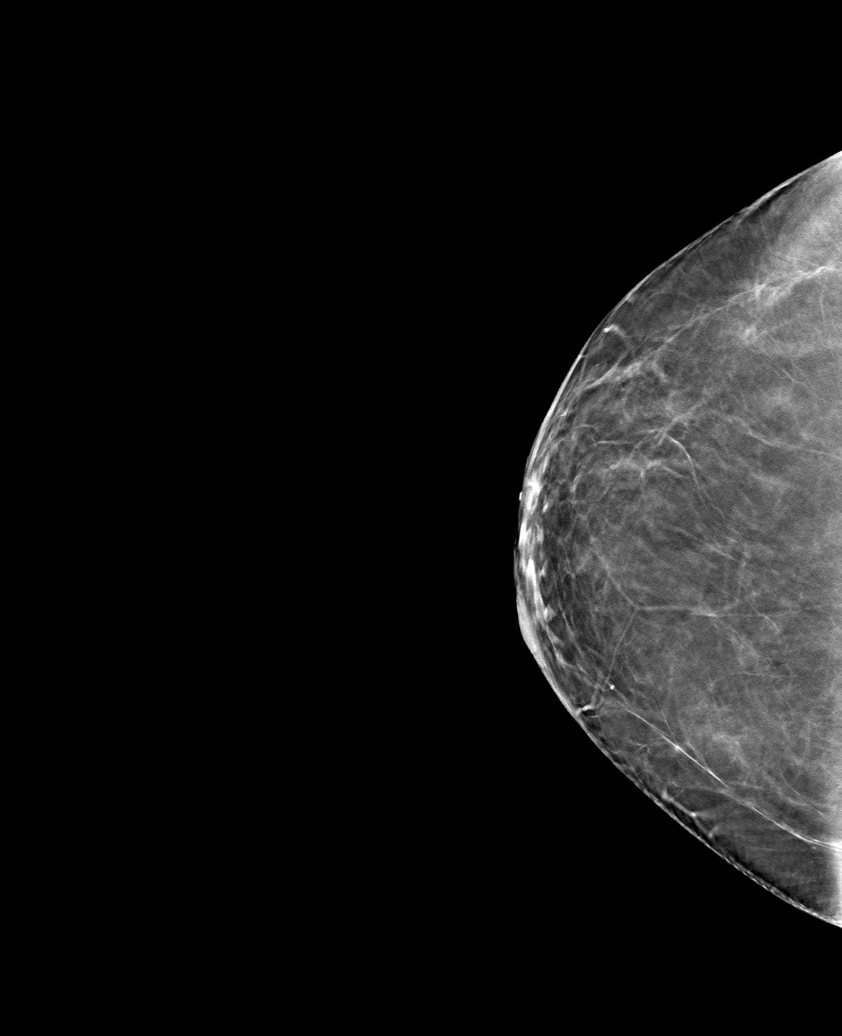

[6 of 30 positions shown; findings below may reference images not displayed]

ACR Breast Density Category b: There are scattered areas of
fibroglandular density.
FINDINGS: There are no findings suspicious for malignancy.
IMPRESSION: No mammographic evidence of malignancy. A result letter of this
screening mammogram will be mailed directly to the patient.

RECOMMENDATION:
Screening mammogram in one year. (Code:51-O-LD2)

BI-RADS CATEGORY  1: Negative.

## 2021-12-18 ENCOUNTER — Telehealth (HOSPITAL_COMMUNITY): Payer: Self-pay

## 2021-12-18 MED ORDER — SERTRALINE HCL 100 MG PO TABS: ORAL_TABLET | ORAL | 0 refills | Status: DC

## 2021-12-18 NOTE — Telephone Encounter (Signed)
Medication. refill - Fax received from pt's [facility] for a new Sertraline order, last provided for 90 days on 09/19/21, pt last seen 09/27/21 and set to return next on 03/27/22 ?

## 2021-12-19 ENCOUNTER — Telehealth: Payer: Self-pay | Admitting: Family

## 2021-12-19 NOTE — Telephone Encounter (Signed)
Left message for patient to call back and schedule Medicare Annual Wellness Visit (AWV) either virtually or phone ? ? ? ?Last AWV ;02/05/17 ?please schedule at anytime with health coach ? ? ?I left my direct number [phone] ?

## 2022-01-08 ENCOUNTER — Ambulatory Visit (INDEPENDENT_AMBULATORY_CARE_PROVIDER_SITE_OTHER): Payer: HMO

## 2022-01-08 VITALS — Ht 60.5 in | Wt 170.0 lb

## 2022-01-08 DIAGNOSIS — Z0000 Encounter for general adult medical examination without abnormal findings: Secondary | ICD-10-CM | POA: Diagnosis not present

## 2022-01-08 NOTE — Progress Notes (Signed)
? ?Subjective:  ? [name] is a 56 y.o. female who presents for Medicare Annual (Subsequent) preventive examination. ? ? ?I connected with [name] today by telephone and verified that I am speaking with the correct person using two identifiers. ?Location patient: home ?Location provider: work ?Persons participating in the virtual visit: patient, nurse.  ?  ?I discussed the limitations, risks, security and privacy concerns of performing an evaluation and management service by telephone and the availability of in person appointments. I also discussed with the patient that there may be a patient responsible charge related to this service. The patient expressed understanding and verbally consented to this telephonic visit.  ?  ?Interactive audio and video telecommunications were attempted between this provider and patient, however failed, due to patient having technical difficulties OR patient did not have access to video capability.  We continued and completed visit with audio only. ? ?Some vital signs may be absent or patient reported.  ? ?Time Spent with patient on telephone encounter: 20 minutes ? ?Review of Systems    ? ?Cardiac Risk Factors include: diabetes mellitus;dyslipidemia;hypertension;obesity (BMI >30kg/m2);sedentary lifestyle ? ?   ?Objective:  ?  ?Today's Vitals  ? 01/08/22 1400  ?Weight: 170 lb (77.1 kg)  ?Height: 5' 0.5" (1.537 m)  ? ?Body mass index is 32.65 kg/m?. ? ? ?  01/08/2022  ?  2:03 PM 05/03/2020  ? 11:39 AM 04/27/2020  ? 12:32 PM 10/18/2016  ?  3:07 PM  ?Advanced Directives  ?Does Patient Have a Medical Advance Directive? No No No No  ?Would patient like information on creating a medical advance directive?  Yes (MAU/Ambulatory/Procedural Areas - Information given) Yes (MAU/Ambulatory/Procedural Areas - Information given) No - Patient declined  ? ? ?Current Medications (verified) ?Outpatient Encounter Medications as of 01/08/2022  ?Medication Sig  ? albuterol (VENTOLIN HFA) 108 (90 Base)  MCG/ACT inhaler INHALE 2 PUFFS BY MOUTH EVERY 4 TO 6 HOURS AS DIRECTED(RESCUE)  ? Alpha-Lipoic Acid 600 MG CAPS Take 300 mg by mouth 2 (two) times daily.   ? atenolol (TENORMIN) 100 MG tablet TAKE 1 TABLET(100 MG) BY MOUTH TWICE DAILY  ? BD PEN NEEDLE NANO 2ND GEN 32G X 4 MM MISC USE 4 TIMES DAILY  ? Blood Glucose Monitoring Suppl (ONE TOUCH ULTRA MINI) w/Device KIT Use to check blood sugar 2 times a day.  ? clobetasol cream (TEMOVATE) 6.19 % Apply 1 application topically 2 (two) times daily.  ? clonazePAM (KLONOPIN) 0.5 MG tablet TAKE 1/2 TABLET(0.25 MG) BY MOUTH DAILY  ? Continuous Blood Gluc Sensor (FREESTYLE LIBRE 14 DAY SENSOR) MISC Use for continuous glucose monitoring.  ? Digestive Enzymes (ENZYME DIGEST PO) Take by mouth.  ? fexofenadine (ALLEGRA) 180 MG tablet Take 180 mg by mouth daily.  ? fluvastatin XL (LESCOL XL) 80 MG 24 hr tablet Take 1 tablet (80 mg total) by mouth daily.  ? glucose blood (ONETOUCH ULTRA) test strip Use to check blood sugar once a day.  ? insulin glargine (LANTUS SOLOSTAR) 100 UNIT/ML Solostar Pen Inject 25 Units into the skin at bedtime.  ? irbesartan (AVAPRO) 75 MG tablet TAKE 1 TABLET(75 MG) BY MOUTH DAILY  ? levothyroxine (SYNTHROID) 75 MCG tablet TAKE 1 TABLET(75 MCG) BY MOUTH DAILY  ? lidocaine (XYLOCAINE) 2 % jelly Apply 1 application topically as needed.  ? methocarbamol (ROBAXIN) 500 MG tablet TAKE 1 TABLET(500 MG) BY MOUTH EVERY 8 HOURS AS NEEDED FOR MUSCLE SPASMS  ? naphazoline-pheniramine (NAPHCON-A) 0.025-0.3 % ophthalmic solution Place 1 drop into  both eyes 2 (two) times daily.  ? norelgestromin-ethinyl estradiol Marilu Favre) 150-35 MCG/24HR transdermal patch Place patch onto the skin once weekly continuously.  ? OVER THE COUNTER MEDICATION 1 capsule daily. Probiotic renew life   ? OVER THE COUNTER MEDICATION   ? sertraline (ZOLOFT) 100 MG tablet Take 2 a day.  ? NOVOLOG FLEXPEN 100 UNIT/ML FlexPen Inject 10-30 Units into the skin 3 (three) times daily with meals.  ?  [DISCONTINUED] EFFEXOR XR 150 MG 24 hr capsule 375 mg daily.   ? ?No facility-administered encounter medications on file as of 01/08/2022.  ? ? ?Allergies (verified) ?Latex, Fluticasone, Metformin and related, and Pravastatin  ? ?History: ?Past Medical History:  ?Diagnosis Date  ? Abnormal Papanicolaou smear of cervix with positive human papilloma virus (HPV) test 02/2017  ? Anxiety   ? Asthma   ? Depression   ? Diabetes mellitus type II   ? Food intolerance   ? Headache   ? High blood pressure   ? IBS (irritable bowel syndrome)   ? Insomnia   ? Migraines   ? PCOS (polycystic ovarian syndrome)   ? PTSD (post-traumatic stress disorder)   ? Seasonal allergies   ? Thyroid disease   ? ?Past Surgical History:  ?Procedure Laterality Date  ? CHOLECYSTECTOMY    ? GALLBLADDER SURGERY    ? ?Family History  ?Problem Relation Age of Onset  ? Bipolar disorder Mother   ? Hypertension Mother   ? Diabetes Mother   ? Alzheimer's disease Father   ? Hypertension Father   ? Alzheimer's disease Maternal Grandmother   ? Heart disease Paternal Grandfather   ? ?Social History  ? ?Socioeconomic History  ? Marital status: Single  ?  Spouse name: Not on file  ? Number of children: 0  ? Years of education: 1  ? Highest education level: Master's degree (e.g., MA, MS, MEng, MEd, MSW, MBA)  ?Occupational History  ? Occupation: Long Term Disability  ?  Comment: PTSD  ?Tobacco Use  ? Smoking status: Never  ? Smokeless tobacco: Never  ?Vaping Use  ? Vaping Use: Never used  ?Substance and Sexual Activity  ? Alcohol use: Not Currently  ?  Alcohol/week: 0.5 standard drinks  ?  Types: 1 Standard drinks or equivalent per week  ? Drug use: No  ? Sexual activity: Not Currently  ?  Partners: Male  ?  Birth control/protection: Patch  ?  Comment: 1st intercourse- 16, partners- 64, current partner- 64yr  ?Other Topics Concern  ? Not on file  ?Social History Narrative  ? Regular exercise: yes, walk  ? Caffeine use: occasionally tea  ? Fun: Social group events,  walking her dogs, movies  ? Denies abuse and feels safe where she lives.   ? ?Social Determinants of Health  ? ?Financial Resource Strain: Low Risk   ? Difficulty of Paying Living Expenses: Not hard at all  ?Food Insecurity: No Food Insecurity  ? Worried About RCharity fundraiserin the Last Year: Never true  ? Ran Out of Food in the Last Year: Never true  ?Transportation Needs: No Transportation Needs  ? Lack of Transportation (Medical): No  ? Lack of Transportation (Non-Medical): No  ?Physical Activity: Inactive  ? Days of Exercise per Week: 0 days  ? Minutes of Exercise per Session: 0 min  ?Stress: No Stress Concern Present  ? Feeling of Stress : Not at all  ?Social Connections: Socially Isolated  ? Frequency of Communication with Friends and Family:  Twice a week  ? Frequency of Social Gatherings with Friends and Family: More than three times a week  ? Attends Religious Services: Never  ? Active Member of Clubs or Organizations: No  ? Attends Archivist Meetings: Never  ? Marital Status: Never married  ? ? ?Tobacco Counseling ?Counseling given: Not Answered ? ? ?Clinical Intake: ? ?Pre-visit preparation completed: Yes ? ?Pain : No/denies pain ? ?  ? ?BMI - recorded: 32.65 ?Nutritional Status: BMI > 30  Obese ?Nutritional Risks: None ?Diabetes: Yes ?CBG done?: No ?Did pt. bring in CBG monitor from home?: No (phone visit) ? ?How often do you need to have someone help you when you read instructions, pamphlets, or other written materials from your doctor or pharmacy?: 1 - Never ? ?Diabetes: ? ?Is the patient diabetic?  Yes  ?If diabetic, was a CBG obtained today?  No  ?Did the patient bring in their glucometer from home?  No phone visit ?How often do you monitor your CBG's? Freestyle monitor.  ? ?Financial Strains and Diabetes Management: ? ?Are you having any financial strains with the device, your supplies or your medication? No .  ?Does the patient want to be seen by Chronic Care Management for  management of their diabetes?  No  ?Would the patient like to be referred to a Nutritionist or for Diabetic Management?  No  ? ?Diabetic Exams: ? ?Diabetic Eye Exam: Completed 06/21/2021.  ? ?Diabetic Foot Exam: Completed

## 2022-01-08 NOTE — Patient Instructions (Signed)
[name] , ?Thank you for taking time to complete your Medicare Wellness Visit. I appreciate your ongoing commitment to your health goals. Please review the following plan we discussed and let me know if I can assist you in the future.  ? ?Screening recommendations/referrals: ?Colonoscopy: Completed Cologuard 09/06/2020-Due 09/07/2023 ?Mammogram: Completed 08/23/2021-Due 08/23/2022 ?Bone Density: Due at age 54 ?Recommended yearly ophthalmology/optometry visit for glaucoma screening and checkup ?Recommended yearly dental visit for hygiene and checkup ? ?Vaccinations: ?Influenza vaccine: Up to date ?Pneumococcal vaccine: Up to date ?Tdap vaccine: Up to date ?Shingles vaccine: Due-May obtain vaccine at  your local pharmacy. ?Covid-19: Up to date ? ?Advanced directives: Please bring a copy of Living Will and/or [place] for your chart once completed ? ? ?Conditions/risks identified: See problem list ? ?Next appointment: Follow up in one year for your annual wellness visit.  ? ?Preventive Care 40-64 Years, Female ?Preventive care refers to lifestyle choices and visits with your health care provider that can promote health and wellness. ?What does preventive care include? ?A yearly physical exam. This is also called an annual well check. ?Dental exams once or twice a year. ?Routine eye exams. Ask your health care provider how often you should have your eyes checked. ?Personal lifestyle choices, including: ?Daily care of your teeth and gums. ?Regular physical activity. ?Eating a healthy diet. ?Avoiding tobacco and drug use. ?Limiting alcohol use. ?Practicing safe sex. ?Taking low-dose aspirin daily starting at age 15. ?Taking vitamin and mineral supplements as recommended by your health care provider. ?What happens during an annual well check? ?The services and screenings done by your health care provider during your annual well check will depend on your age, overall health, lifestyle risk factors, and  family history of disease. ?Counseling  ?Your health care provider may ask you questions about your: ?Alcohol use. ?Tobacco use. ?Drug use. ?Emotional well-being. ?Home and relationship well-being. ?Sexual activity. ?Eating habits. ?Work and work Statistician. ?Method of birth control. ?Menstrual cycle. ?Pregnancy history. ?Screening  ?You may have the following tests or measurements: ?Height, weight, and BMI. ?Blood pressure. ?Lipid and cholesterol levels. These may be checked every 5 years, or more frequently if you are over 19 years old. ?Skin check. ?Lung cancer screening. You may have this screening every year starting at age 70 if you have a 30-pack-year history of smoking and currently smoke or have quit within the past 15 years. ?Fecal occult blood test (FOBT) of the stool. You may have this test every year starting at age 38. ?Flexible sigmoidoscopy or colonoscopy. You may have a sigmoidoscopy every 5 years or a colonoscopy every 10 years starting at age 30. ?Hepatitis C blood test. ?Hepatitis B blood test. ?Sexually transmitted disease (STD) testing. ?Diabetes screening. This is done by checking your blood sugar (glucose) after you have not eaten for a while (fasting). You may have this done every 1-3 years. ?Mammogram. This may be done every 1-2 years. Talk to your health care provider about when you should start having regular mammograms. This may depend on whether you have a family history of breast cancer. ?BRCA-related cancer screening. This may be done if you have a family history of breast, ovarian, tubal, or peritoneal cancers. ?Pelvic exam and Pap test. This may be done every 3 years starting at age 61. Starting at age 38, this may be done every 5 years if you have a Pap test in combination with an HPV test. ?Bone density scan. This is done to screen for osteoporosis.  You may have this scan if you are at high risk for osteoporosis. ?Discuss your test results, treatment options, and if necessary,  the need for more tests with your health care provider. ?Vaccines  ?Your health care provider may recommend certain vaccines, such as: ?Influenza vaccine. This is recommended every year. ?Tetanus, diphtheria, and acellular pertussis (Tdap, Td) vaccine. You may need a Td booster every 10 years. ?Zoster vaccine. You may need this after age 38. ?Pneumococcal 13-valent conjugate (PCV13) vaccine. You may need this if you have certain conditions and were not previously vaccinated. ?Pneumococcal polysaccharide (PPSV23) vaccine. You may need one or two doses if you smoke cigarettes or if you have certain conditions. ?Talk to your health care provider about which screenings and vaccines you need and how often you need them. ?This information is not intended to replace advice given to you by your health care provider. Make sure you discuss any questions you have with your health care provider. ?Document Released: 09/30/2015 Document Revised: 05/23/2016 Document Reviewed: 07/05/2015 ?Elsevier Interactive Patient Education ? 2017 [employer] ? ? ? ?Fall Prevention in the Home ?Falls can cause injuries. They can happen to people of all ages. There are many things you can do to make your home safe and to help prevent falls. ?What can I do on the outside of my home? ?Regularly fix the edges of walkways and driveways and fix any cracks. ?Remove anything that might make you trip as you walk through a door, such as a raised step or threshold. ?Trim any bushes or trees on the path to your home. ?Use bright outdoor lighting. ?Clear any walking paths of anything that might make someone trip, such as rocks or tools. ?Regularly check to see if handrails are loose or broken. Make sure that both sides of any steps have handrails. ?Any raised decks and porches should have guardrails on the edges. ?Have any leaves, snow, or ice cleared regularly. ?Use sand or salt on walking paths during winter. ?Clean up any spills in your garage right  away. This includes oil or grease spills. ?What can I do in the bathroom? ?Use night lights. ?Install grab bars by the toilet and in the tub and shower. Do not use towel bars as grab bars. ?Use non-skid mats or decals in the tub or shower. ?If you need to sit down in the shower, use a plastic, non-slip stool. ?Keep the floor dry. Clean up any water that spills on the floor as soon as it happens. ?Remove soap buildup in the tub or shower regularly. ?Attach bath mats securely with double-sided non-slip rug tape. ?Do not have throw rugs and other things on the floor that can make you trip. ?What can I do in the bedroom? ?Use night lights. ?Make sure that you have a light by your bed that is easy to reach. ?Do not use any sheets or blankets that are too big for your bed. They should not hang down onto the floor. ?Have a firm chair that has side arms. You can use this for support while you get dressed. ?Do not have throw rugs and other things on the floor that can make you trip. ?What can I do in the kitchen? ?Clean up any spills right away. ?Avoid walking on wet floors. ?Keep items that you use a lot in easy-to-reach places. ?If you need to reach something above you, use a strong step stool that has a grab bar. ?Keep electrical cords out of the way. ?Do not use  floor polish or wax that makes floors slippery. If you must use wax, use non-skid floor wax. ?Do not have throw rugs and other things on the floor that can make you trip. ?What can I do with my stairs? ?Do not leave any items on the stairs. ?Make sure that there are handrails on both sides of the stairs and use them. Fix handrails that are broken or loose. Make sure that handrails are as long as the stairways. ?Check any carpeting to make sure that it is firmly attached to the stairs. Fix any carpet that is loose or worn. ?Avoid having throw rugs at the top or bottom of the stairs. If you do have throw rugs, attach them to the floor with carpet tape. ?Make sure  that you have a light switch at the top of the stairs and the bottom of the stairs. If you do not have them, ask someone to add them for you. ?What else can I do to help prevent falls? ?Wear shoes that: ?D

## 2022-02-13 ENCOUNTER — Other Ambulatory Visit: Payer: Self-pay | Admitting: Internal Medicine

## 2022-02-13 DIAGNOSIS — E1165 Type 2 diabetes mellitus with hyperglycemia: Secondary | ICD-10-CM

## 2022-02-15 ENCOUNTER — Other Ambulatory Visit: Payer: Self-pay

## 2022-02-15 DIAGNOSIS — Z794 Long term (current) use of insulin: Secondary | ICD-10-CM

## 2022-02-15 MED ORDER — LEVOTHYROXINE SODIUM 75 MCG PO TABS: ORAL_TABLET | ORAL | 1 refills | Status: DC

## 2022-02-22 ENCOUNTER — Encounter: Payer: Self-pay | Admitting: Internal Medicine

## 2022-02-22 ENCOUNTER — Ambulatory Visit (INDEPENDENT_AMBULATORY_CARE_PROVIDER_SITE_OTHER): Payer: HMO | Admitting: Internal Medicine

## 2022-02-22 VITALS — BP 130/88 | HR 62 | Ht 60.5 in | Wt 171.4 lb

## 2022-02-22 DIAGNOSIS — E039 Hypothyroidism, unspecified: Secondary | ICD-10-CM | POA: Diagnosis not present

## 2022-02-22 DIAGNOSIS — E1165 Type 2 diabetes mellitus with hyperglycemia: Secondary | ICD-10-CM | POA: Diagnosis not present

## 2022-02-22 DIAGNOSIS — Z794 Long term (current) use of insulin: Secondary | ICD-10-CM

## 2022-02-22 DIAGNOSIS — E782 Mixed hyperlipidemia: Secondary | ICD-10-CM | POA: Diagnosis not present

## 2022-02-22 LAB — COMPREHENSIVE METABOLIC PANEL
ALT: 12 U/L (ref 0–35)
AST: 14 U/L (ref 0–37)
Albumin: 3.9 g/dL (ref 3.5–5.2)
Alkaline Phosphatase: 66 U/L (ref 39–117)
BUN: 17 mg/dL (ref 6–23)
CO2: 28 mEq/L (ref 19–32)
Calcium: 9.6 mg/dL (ref 8.4–10.5)
Chloride: 96 mEq/L (ref 96–112)
Creatinine, Ser: 0.86 mg/dL (ref 0.40–1.20)
GFR: 76.71 mL/min (ref >60.00)
Glucose, Bld: 142 mg/dL — ABNORMAL HIGH (ref 70–99)
Potassium: 4.7 mEq/L (ref 3.5–5.1)
Sodium: 133 mEq/L — ABNORMAL LOW (ref 135–145)
Total Bilirubin: 0.3 mg/dL (ref 0.2–1.2)
Total Protein: 7.6 g/dL (ref 6.0–8.3)

## 2022-02-22 LAB — LIPID PANEL
Cholesterol: 346 mg/dL — ABNORMAL HIGH (ref 0–200)
HDL: 130.7 mg/dL (ref >39.00)
LDL Cholesterol: 191 mg/dL — ABNORMAL HIGH (ref 0–99)
NonHDL: 215.58
Total CHOL/HDL Ratio: 3
Triglycerides: 121 mg/dL (ref 0.0–149.0)
VLDL: 24.2 mg/dL (ref 0.0–40.0)

## 2022-02-22 LAB — T4, FREE: Free T4: 0.91 ng/dL (ref 0.60–1.60)

## 2022-02-22 LAB — TSH: TSH: 8.33 u[IU]/mL — ABNORMAL HIGH (ref 0.35–5.50)

## 2022-02-22 LAB — MICROALBUMIN / CREATININE URINE RATIO
Creatinine,U: 131.1 mg/dL
Microalb Creat Ratio: 6.5 mg/g (ref 0.0–30.0)
Microalb, Ur: 8.5 mg/dL — ABNORMAL HIGH (ref 0.0–1.9)

## 2022-02-22 NOTE — Patient Instructions (Addendum)
Please continue: - Lantus 20 units in am  Change: - Novolog 10-15 units before meals and 22-25 units before a high-carb meal  Please stop at the lab.  Please return in  4 months.

## 2022-02-22 NOTE — Progress Notes (Signed)
Patient ID: [name], female   DOB: Mar 18, 1966, 56 y.o.   MRN: [id number]  HPI: [name] is a 56 y.o.-year-old female, returning for f/u for DM2, dx 2013, insulin-dependent, uncontrolled, with complications (diabetic retinopathy) and also hypothyroidism. Last visit 4 months ago.  Interim history: No increased urination, blurry vision, nausea, chest pain. She has occasional nausea, which is not new.  DM2: Reviewed HbA1c levels: Lab Results  Component Value Date   HGBA1C 6.4 (A) 10/19/2021   HGBA1C 5.9 (A) 06/15/2021   HGBA1C 6.7 (A) 02/06/2021   Pt is on a regimen of: - Lantus 12 >> ... 20 >> 25 >> 20 >> 20-22 units at bedtime >> in am - NovoLog 15-20 units 15 minutes before each meal, 24-26 for a meal + icecream We stopped Cycloset in 06/2020. She stopped Rybelsus 7 mg >> stopped 2/2 N and D >> diarrhea stopped She did not start Welchol 2 tabs 2x a day - added 03/2017 - too expensive On Turmeric, alpha-lipoic acid + 660 mcg biotin per day, Garlicin. We stopped Glipizide when we started NovoLog.  She has various intolerances to different diabetic medicines: Metformin ER >> in the past, she had gastric discomfort and severe diarrhea (loss of bowel control).  However, we restarted this 08/2017 and she is tolerating this well. Januvia >> tried for 1-2 months >> left chest pain She was wondering if she could take Invokana, however she had multiple vaginal yeast infections. She Also had nocturia 3-4 x a night.  She had reactive hypoglycemia symptoms while on Quercetin (was taking it for allergies) >> we stopped this it is known to increase insulin production by the pancreas.  She checks her sugars more than 4 times a day with her libre CGM:   Previously:   Previously:   Lowest: 68 >> LO x2 >> 60s >> 47 >> 40s. Highest: 455 >> 300s >> 250s >> 350 (mints) >> 200s.  She has many food intolerances: - Gluten >> cannot eat bread or pastry.  - raw vegetables, beans,  cruciferous vegetables, fruit >> diarrhea.  - No dairy but, cereals  -at night -now off ice cream In the past, no she was taking up to 36 units of NovoLog for ice cream.  I strongly advised her to stop this in the past. She does not digest fat well after her cholecystectomy in 2011.   Meter: One Touch Mini  No CKD, last BUN/creatinine was:  Lab Results  Component Value Date   BUN 20 02/06/2021   CREATININE 0.84 02/06/2021  On irbesartan.  + HL: Lab Results  Component Value Date   CHOL 293 (H) 02/06/2021   HDL 105.60 02/06/2021   LDLCALC 152 (H) 02/06/2021   LDLDIRECT 205.0 01/24/2016   TRIG 175.0 (H) 02/06/2021   CHOLHDL 3 02/06/2021  Previous cholesterol levels were: 325/197/132/154.   She could not tolerate pravastatin or Livalo due to muscle aches.  She refused a referral to lipid clinic in the past for PCSK9 inhibitors.  On fluvastatin XL (daily now, restarted 2 week ago) + CoQ10. I suggested Zetia but she refused due to the possibility of developing diarrhea on it.  - last dilated eye exam was in 06/2021: + DR. Dr. Lennox Grumbles.   - + Numbness and tingling in her legs.  She is on the R enantiomer of alpha-lipoic acid.  Last foot exam 10/19/2021.  She also has a history of PCOS- she has seen Dr. Barry Dienes at [facility] in the past -  note from 08/26/2012: perimenopause, and at that time she was on Yasmin, subsequently changed to Obion. She sees Dr. Vernice Jefferson with OB/GYN. She has PTSD, MDD, insomnia, anxiety, asthma, HTN, GERD, status post cholecystectomy, anemia, transaminitis. She also had increased uterine bleeding (on Necon). She sawan acupuncturist. She takes Nettle powder >> helped her with diarrhea, anemia, insomnia. 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 Also, Undecylenic acid and mastic gum, no carbs x 1-2 months.  She has IBS >> better on L- plantarum, S.bulardii - Brevibacillus Laterosporus 20 min before b'fast.    Hypothyroidism  -Diagnosed in 02/2018. We started levothyroxine 03/2018.  Pt is on levothyroxine 75 mcg daily (dose increased 03/2020), taken: - in am - fasting - at least 30 min from b'fast - + calcium in butyrate later in the day - no iron - no multivitamins - no PPIs - not on Biotin On Kelp - 1 capsule a day.  Reviewed her TFTs: Lab Results  Component Value Date   TSH 2.02 02/06/2021   TSH 2.82 07/01/2020   TSH 6.44 (H) 03/22/2020   TSH 2.96 04/01/2019   TSH 1.26 07/30/2018   TSH 10.64 (H) 04/04/2018   TSH 4.80 (H) 03/13/2018   TSH 2.91 01/24/2016   TSH 2.322 02/26/2013   TSH 2.050 08/07/2012   FREET4 0.63 02/06/2021   FREET4 0.78 07/01/2020   FREET4 0.91 03/22/2020   FREET4 0.60 04/01/2019   FREET4 0.84 07/30/2018   FREET4 0.72 04/04/2018   T3FREE 2.6 04/04/2018   Her antithyroid antibodies were not elevated: Component     Latest Ref Rng & Units 04/04/2018  Thyroglobulin Ab     < or = 1 IU/mL <1  Thyroperoxidase Ab SerPl-aCnc     <9 IU/mL 2   No FH of thyroid cancer. No h/o radiation tx to head or neck. On Kelp. No Biotin use. No recent steroids use.   Pt denies: - feeling nodules in neck - hoarseness - dysphagia - choking  She started vitamin D 5000 units - started 2 mo ago.  ROS: + See HPI Neurological: no tremors/+ numbness/+ tingling/no dizziness  I reviewed pt's medications, allergies, PMH, social hx, family hx, and changes were documented in the history of present illness. Otherwise, unchanged from my initial visit note.  Past Medical History:  Diagnosis Date   Abnormal Papanicolaou smear of cervix with positive human papilloma virus (HPV) test 02/2017   Anxiety    Asthma    Depression    Diabetes mellitus type II    Food intolerance    Headache    High blood pressure    IBS (irritable bowel syndrome)    Insomnia    Migraines    PCOS (polycystic ovarian syndrome)    PTSD (post-traumatic stress disorder)    Seasonal allergies     Thyroid disease    Past Surgical History:  Procedure Laterality Date   CHOLECYSTECTOMY     GALLBLADDER SURGERY     Social History   Socioeconomic History   Marital status: Single    Spouse name: Not on file   Number of children: 0   Years of education: 65   Highest education level: Master's degree (e.g., MA, MS, MEng, MEd, MSW, MBA)  Occupational History   Occupation: Long Term Disability    Comment: PTSD  Tobacco Use   Smoking status: Never   Smokeless tobacco: Never  Vaping Use   Vaping Use: Never used  Substance and Sexual  Activity   Alcohol use: Not Currently    Alcohol/week: 0.5 standard drinks of alcohol    Types: 1 Standard drinks or equivalent per week   Drug use: No   Sexual activity: Not Currently    Partners: Male    Birth control/protection: Patch    Comment: 1st intercourse- 16, partners- 36, current partner- 37yr  Other Topics Concern   Not on file  Social History Narrative   Regular exercise: yes, walk   Caffeine use: occasionally tea   Fun: Social group events, walking her dogs, movies   Denies abuse and feels safe where she lives.    Social Determinants of Health   Financial Resource Strain: Low Risk  (01/08/2022)   Overall Financial Resource Strain (CARDIA)    Difficulty of Paying Living Expenses: Not hard at all  Food Insecurity: No Food Insecurity (01/08/2022)   Hunger Vital Sign    Worried About Running Out of Food in the Last Year: Never true    Ran Out of Food in the Last Year: Never true  Transportation Needs: No Transportation Needs (01/08/2022)   PRAPARE - THydrologist(Medical): No    Lack of Transportation (Non-Medical): No  Physical Activity: Inactive (01/08/2022)   Exercise Vital Sign    Days of Exercise per Week: 0 days    Minutes of Exercise per Session: 0 min  Stress: No Stress Concern Present (01/08/2022)   [place]   Feeling of  Stress : Not at all  Social Connections: Socially Isolated (01/08/2022)   Social Connection and Isolation Panel [NHANES]    Frequency of Communication with Friends and Family: Twice a week    Frequency of Social Gatherings with Friends and Family: More than three times a week    Attends Religious Services: Never    AMarine scientistor Organizations: No    Attends CArchivistMeetings: Never    Marital Status: Never married  Intimate Partner Violence: Not At Risk (01/08/2022)   Humiliation, Afraid, Rape, and Kick questionnaire    Fear of Current or Ex-Partner: No    Emotionally Abused: No    Physically Abused: No    Sexually Abused: No   Current Outpatient Medications on File Prior to Visit  Medication Sig Dispense Refill   albuterol (VENTOLIN HFA) 108 (90 Base) MCG/ACT inhaler INHALE 2 PUFFS BY MOUTH EVERY 4 TO 6 HOURS AS DIRECTED(RESCUE) 42.5 g 0   Alpha-Lipoic Acid 600 MG CAPS Take 300 mg by mouth 2 (two) times daily.      atenolol (TENORMIN) 100 MG tablet TAKE 1 TABLET(100 MG) BY MOUTH TWICE DAILY 180 tablet 3   BD PEN NEEDLE NANO 2ND GEN 32G X 4 MM MISC USE 4 TIMES DAILY 400 each 3   Blood Glucose Monitoring Suppl (ONE TOUCH ULTRA MINI) w/Device KIT Use to check blood sugar 2 times a day. 1 kit 0   clobetasol cream (TEMOVATE) 01.32% Apply 1 application topically 2 (two) times daily. 30 g 1   clonazePAM (KLONOPIN) 0.5 MG tablet TAKE 1/2 TABLET(0.25 MG) BY MOUTH DAILY 15 tablet 0   Continuous Blood Gluc Sensor (FREESTYLE LIBRE 14 DAY SENSOR) MISC Use for continuous glucose monitoring. 6 each 3   Digestive Enzymes (ENZYME DIGEST PO) Take by mouth.     fexofenadine (ALLEGRA) 180 MG tablet Take 180 mg by mouth daily.     fluvastatin XL (LESCOL XL) 80 MG 24  hr tablet Take 1 tablet (80 mg total) by mouth daily. 90 tablet 3   glucose blood (ONETOUCH ULTRA) test strip Use to check blood sugar once a day. 100 strip 11   Insulin Aspart FlexPen (NOVOLOG) 100 UNIT/ML ADMINISTER 10  TO 30 UNITS UNDER THE SKIN THREE TIMES DAILY BEFORE MEALS 90 mL 2   insulin glargine (LANTUS SOLOSTAR) 100 UNIT/ML Solostar Pen Inject 25 Units into the skin at bedtime. 30 mL 0   irbesartan (AVAPRO) 75 MG tablet TAKE 1 TABLET(75 MG) BY MOUTH DAILY 90 tablet 3   levothyroxine (SYNTHROID) 75 MCG tablet TAKE 1 TABLET(75 MCG) BY MOUTH DAILY 90 tablet 1   lidocaine (XYLOCAINE) 2 % jelly Apply 1 application topically as needed. 30 mL 4   methocarbamol (ROBAXIN) 500 MG tablet TAKE 1 TABLET(500 MG) BY MOUTH EVERY 8 HOURS AS NEEDED FOR MUSCLE SPASMS 60 tablet 1   naphazoline-pheniramine (NAPHCON-A) 0.025-0.3 % ophthalmic solution Place 1 drop into both eyes 2 (two) times daily.     norelgestromin-ethinyl estradiol Marilu Favre) 150-35 MCG/24HR transdermal patch Place patch onto the skin once weekly continuously. 12 patch 4   OVER THE COUNTER MEDICATION 1 capsule daily. Probiotic renew life      OVER THE COUNTER MEDICATION      sertraline (ZOLOFT) 100 MG tablet Take 2 a day. 180 tablet 0   [DISCONTINUED] EFFEXOR XR 150 MG 24 hr capsule 375 mg daily.      No current facility-administered medications on file prior to visit.   Allergies  Allergen Reactions   Latex Itching and Swelling   Fluticasone     Nose bleeds   Metformin And Related     GI side effects   Pravastatin     myalgias   Family History  Problem Relation Age of Onset   Bipolar disorder Mother    Hypertension Mother    Diabetes Mother    Alzheimer's disease Father    Hypertension Father    Alzheimer's disease Maternal Grandmother    Heart disease Paternal Grandfather     PE: BP 130/88 (BP Location: Right Arm, Patient Position: Sitting, Cuff Size: Normal)   Pulse 62   Ht 5' 0.5" (1.537 m)   Wt 171 lb 6.4 oz (77.7 kg)   SpO2 97%   BMI 32.92 kg/m    Wt Readings from Last 3 Encounters:  02/22/22 171 lb 6.4 oz (77.7 kg)  01/08/22 170 lb (77.1 kg)  10/19/21 170 lb (77.1 kg)   Constitutional: overweight, in NAD Eyes: EOMI, no  exophthalmos ENT: moist mucous membranes, no thyromegaly, no cervical lymphadenopathy Cardiovascular: RRR, No MRG Respiratory: CTA B Musculoskeletal: no deformities Skin: moist, warm, no rashes Neurological: no tremor with outstretched hands  ASSESSMENT: 1. DM2, insulin-dependent, fairly well controlled, with complications - DR  2. HL - Very high LDL - high HDL - slightly high TG  3.  Hypothyroidism  PLAN:  1. DM2 - Patient with insulin-dependent type 2 diabetes, with suboptimal control, mostly due to many medication intolerances and binge eating disorder.  She is currently on a basal-bolus insulin regimen with a lower dose of Lantus and fluctuating doses of NovoLog.  At last visit, HbA1c was higher, at 6.4%.  At that time, reviewing her CGM traces, it appeared that her sugars were again fairly well controlled during the night and the first half of the day but they were increasing after lunch and especially after dinner.  Also, they were higher after the snack at night.  They may  drop afterwards, around 12 AM, and upon questioning, she was taking her NovoLog too late.  She was not usually waiting 15 minutes between the NovoLog injection and the meal.  I strongly advised her to start doing so.  Otherwise, I advised her to continue the same regimen. CGM interpretation: -At today's visit, we reviewed her CGM downloads: It appears that 83% of values are in target range (goal >70%), while 12% are higher than 180 (goal <25%), and 5% are lower than 70 (goal <4%).  The calculated average blood sugar is 130.  The projected HbA1c for the next 3 months (GMI) is 6.4%. -Reviewing the CGM trends, sugars appear to have been improved since last visit.  She mentions that she is starting to see better blood sugars after she was able to move Lantus to the morning.  However, as of now, reviewing the ambulatory glucose profile, and also individual daily traces, she appears to have low blood sugars, in the 50s and  sometimes even lower.  She is trying to bolus 15 minutes before meals, so at this point, we discussed that she may need lower amounts of NovoLog before regular meals and may be slightly lower doses also before high carb meals.  She still has an occasional high blood sugar after such meals, but these are not consistent.  For now, we will continue with the same dose of Lantus.  - I advised her to:  Patient Instructions  Please continue: - Lantus 20 units in am  Change: - Novolog 10-15 units before meals and 22-25 units before a high-carb meal  Please stop at the lab.  Please return in  4 months.  - we checked her HbA1c: 6.2% (lower) - advised to check sugars at different times of the day - 4x a day, rotating check times - advised for yearly eye exams >> she is UTD - will check annual labs today - return to clinic in 4 months  2. HL -Reviewed latest lipid panel from 01/2021: LDL very high, triglycerides also high: Lab Results  Component Value Date   CHOL 293 (H) 02/06/2021   HDL 105.60 02/06/2021   LDLCALC 152 (H) 02/06/2021   LDLDIRECT 205.0 01/24/2016   TRIG 175.0 (H) 02/06/2021   CHOLHDL 3 02/06/2021  -She could not tolerate Livalo.  I also suggested Zetia but she declined that she read that this could cause diarrhea. -She declined a referral to the lipid clinic for PCSK9 inhibitors. -She is currently on fluvastatin XL.  She is not usually consistent with taking this, and was off the medication-restarted 2 weeks ago. -She continues to eat ice cream and other sweets. - will check a lipid panel today-she is fasting  3.   Hypothyroidism - latest thyroid labs reviewed with pt. >> normal: Lab Results  Component Value Date   TSH 2.02 02/06/2021  - she continues on LT4 75 mcg daily - pt feels good on this dose. - we discussed about taking the thyroid hormone every day, with water, >30 minutes before breakfast, separated by >4 hours from acid reflux medications, calcium, iron,  multivitamins. Pt. is taking it correctly. - will check thyroid tests today: TSH and fT4 - If labs are abnormal, she will need to return for repeat TFTs in 1.5 months  Component     Latest Ref Rng 02/22/2022  TSH     0.35 - 5.50 uIU/mL 8.33 (H)   Cholesterol     0 - 200 mg/dL 346 (H)   Triglycerides  0.0 - 149.0 mg/dL 121.0   HDL Cholesterol     >39.00 mg/dL 130.70   VLDL     0.0 - 40.0 mg/dL 24.2   LDL (calc)     0 - 99 mg/dL 191 (H)   Total CHOL/HDL Ratio 3   NonHDL 215.58   Sodium     135 - 145 mEq/L 133 (L)   Potassium     3.5 - 5.1 mEq/L 4.7   Chloride     96 - 112 mEq/L 96   CO2     19 - 32 mEq/L 28   Glucose     70 - 99 mg/dL 142 (H)   BUN     6 - 23 mg/dL 17   Creatinine     0.40 - 1.20 mg/dL 0.86   Total Bilirubin     0.2 - 1.2 mg/dL 0.3   Alkaline Phosphatase     39 - 117 U/L 66   AST     0 - 37 U/L 14   ALT     0 - 35 U/L 12   Total Protein     6.0 - 8.3 g/dL 7.6   Albumin     3.5 - 5.2 g/dL 3.9   Calcium     8.4 - 10.5 mg/dL 9.6   GFR     >60.00 mL/min 76.71   T4,Free(Direct)     0.60 - 1.60 ng/dL 0.91   Microalb, Ur     0.0 - 1.9 mg/dL 8.5 (H)   Creatinine,U     mg/dL 131.1   MICROALB/CREAT RATIO     0.0 - 30.0 mg/g 6.5    Urinary ACR not elevated. TSH is elevated.  We will go ahead and increase the levothyroxine dose to 88 mcg daily. Cholesterol levels are very high. She will definitely need to take the fluvastatin consistently.  She also needs to improve her diet.  She would benefit from a referral to the lipid clinic.  We will again discuss with her.  Philemon Kingdom, MD PhD [facility]

## 2022-02-23 LAB — POCT GLYCOSYLATED HEMOGLOBIN (HGB A1C): Hemoglobin A1C: 6.2 % — AB (ref 4.0–5.6)

## 2022-02-23 MED ORDER — LEVOTHYROXINE SODIUM 88 MCG PO TABS: ORAL_TABLET | ORAL | 5 refills | Status: DC

## 2022-03-17 ENCOUNTER — Other Ambulatory Visit (HOSPITAL_COMMUNITY): Payer: Self-pay | Admitting: Psychiatry

## 2022-03-26 ENCOUNTER — Other Ambulatory Visit: Payer: Self-pay

## 2022-03-26 ENCOUNTER — Telehealth (HOSPITAL_COMMUNITY): Payer: Self-pay | Admitting: Psychiatry

## 2022-03-26 ENCOUNTER — Telehealth: Payer: Self-pay

## 2022-03-26 ENCOUNTER — Encounter (HOSPITAL_COMMUNITY): Payer: Self-pay | Admitting: Emergency Medicine

## 2022-03-26 ENCOUNTER — Emergency Department (HOSPITAL_COMMUNITY)
Admission: EM | Admit: 2022-03-26 | Discharge: 2022-03-26 | Discharge status: Against Medical Advice | Payer: HMO | Attending: Emergency Medicine | Admitting: Emergency Medicine

## 2022-03-26 DIAGNOSIS — H1033 Unspecified acute conjunctivitis, bilateral: Secondary | ICD-10-CM | POA: Diagnosis not present

## 2022-03-26 DIAGNOSIS — Z5321 Procedure and treatment not carried out due to patient leaving prior to being seen by health care provider: Secondary | ICD-10-CM | POA: Insufficient documentation

## 2022-03-26 DIAGNOSIS — R42 Dizziness and giddiness: Secondary | ICD-10-CM | POA: Insufficient documentation

## 2022-03-26 DIAGNOSIS — R519 Headache, unspecified: Secondary | ICD-10-CM | POA: Diagnosis not present

## 2022-03-26 DIAGNOSIS — R7309 Other abnormal glucose: Secondary | ICD-10-CM | POA: Insufficient documentation

## 2022-03-26 DIAGNOSIS — E1165 Type 2 diabetes mellitus with hyperglycemia: Secondary | ICD-10-CM

## 2022-03-26 LAB — COMPREHENSIVE METABOLIC PANEL
ALT: 15 U/L (ref 0–44)
AST: 19 U/L (ref 15–41)
Albumin: 3.5 g/dL (ref 3.5–5.0)
Alkaline Phosphatase: 69 U/L (ref 38–126)
Anion gap: 10 (ref 5–15)
BUN: 15 mg/dL (ref 6–20)
CO2: 23 mmol/L (ref 22–32)
Calcium: 9.1 mg/dL (ref 8.9–10.3)
Chloride: 101 mmol/L (ref 98–111)
Creatinine, Ser: 0.71 mg/dL (ref 0.44–1.00)
GFR, Estimated: >60 mL/min (ref >60)
Glucose, Bld: 188 mg/dL — ABNORMAL HIGH (ref 70–99)
Potassium: 3.9 mmol/L (ref 3.5–5.1)
Sodium: 134 mmol/L — ABNORMAL LOW (ref 135–145)
Total Bilirubin: 0.6 mg/dL (ref 0.3–1.2)
Total Protein: 7.7 g/dL (ref 6.5–8.1)

## 2022-03-26 LAB — CBC WITH DIFFERENTIAL/PLATELET
Abs Immature Granulocytes: 0.05 10*3/uL (ref 0.00–0.07)
Basophils Absolute: 0 10*3/uL (ref 0.0–0.1)
Basophils Relative: 0 %
Eosinophils Absolute: 0.6 10*3/uL — ABNORMAL HIGH (ref 0.0–0.5)
Eosinophils Relative: 6 %
HCT: 33.9 % — ABNORMAL LOW (ref 36.0–46.0)
Hemoglobin: 10.9 g/dL — ABNORMAL LOW (ref 12.0–15.0)
Immature Granulocytes: 1 %
Lymphocytes Relative: 17 %
Lymphs Abs: 1.7 10*3/uL (ref 0.7–4.0)
MCH: 28.8 pg (ref 26.0–34.0)
MCHC: 32.2 g/dL (ref 30.0–36.0)
MCV: 89.4 fL (ref 80.0–100.0)
Monocytes Absolute: 0.4 10*3/uL (ref 0.1–1.0)
Monocytes Relative: 4 %
Neutro Abs: 7.6 10*3/uL (ref 1.7–7.7)
Neutrophils Relative %: 72 %
Platelets: 255 10*3/uL (ref 150–400)
RBC: 3.79 MIL/uL — ABNORMAL LOW (ref 3.87–5.11)
RDW: 15.3 % (ref 11.5–15.5)
WBC: 10.4 10*3/uL (ref 4.0–10.5)
nRBC: 0 % (ref 0.0–0.2)

## 2022-03-26 LAB — CBG MONITORING, ED
Glucose-Capillary: 192 mg/dL — ABNORMAL HIGH (ref 70–99)
Glucose-Capillary: 144 mg/dL — ABNORMAL HIGH (ref 70–99)

## 2022-03-26 LAB — MAGNESIUM: Magnesium: 1.8 mg/dL (ref 1.7–2.4)
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
## 2022-03-26 NOTE — Telephone Encounter (Signed)
Pt needs a letter to excuse her from [employer].  CB # [id number]  Has an appt tomorrow in office. Can pick up then.

## 2022-03-26 NOTE — Telephone Encounter (Signed)
Pt calling so we would be aware of her blood sugar issues. Since her episode on 7/3 her adrenaline has been very high. She is very concerned about this. Also her endocrinologist is aware and would like to know what you think before changing anything.  To be addressed at tomorrows apt

## 2022-03-26 NOTE — Telephone Encounter (Signed)
Stress and adrenaline should actually have increased her blood sugars, not decreased.  I am not sure what she means by medication to decrease her adrenaline... If she does have increased stress, the treatment is to reduce this.  I unfortunately cannot help with this.

## 2022-03-26 NOTE — ED Triage Notes (Signed)
Pt reports checking her CBG today and it being 90. Pt reports feeling shaky and feeling weak. Pt CBG in triage 190s.

## 2022-03-26 NOTE — ED Notes (Signed)
Pt says she is going home

## 2022-03-26 NOTE — ED Notes (Signed)
Pt is ambulatory all around the waiting room despite saying she can barely walk. No signs of gait issues. Pt says her cbg reader is showing 70. Hospital CBG is 144

## 2022-03-27 ENCOUNTER — Encounter (HOSPITAL_COMMUNITY): Payer: Self-pay | Admitting: Psychiatry

## 2022-03-27 ENCOUNTER — Telehealth (INDEPENDENT_AMBULATORY_CARE_PROVIDER_SITE_OTHER): Payer: HMO | Admitting: Psychiatry

## 2022-03-27 ENCOUNTER — Telehealth: Payer: Self-pay

## 2022-03-27 DIAGNOSIS — F4310 Post-traumatic stress disorder, unspecified: Secondary | ICD-10-CM | POA: Diagnosis not present

## 2022-03-27 DIAGNOSIS — F411 Generalized anxiety disorder: Secondary | ICD-10-CM | POA: Diagnosis not present

## 2022-03-27 DIAGNOSIS — F3341 Major depressive disorder, recurrent, in partial remission: Secondary | ICD-10-CM

## 2022-03-27 MED ORDER — DEXCOM G7 RECEIVER DEVI: 0 refills | Status: DC

## 2022-03-27 MED ORDER — DEXCOM G7 SENSOR MISC: 3 refills | Status: DC

## 2022-03-27 MED ORDER — CLONAZEPAM 0.5 MG PO TABS: ORAL_TABLET | ORAL | 0 refills | Status: DC

## 2022-03-27 NOTE — Telephone Encounter (Signed)
Pt called staying that she had been to the ER and wanted you to take a look at her labs. She also stated that she would like you you and her endo provider to work together on her DM and health more- she is scheduled for Thursday at 11:20.

## 2022-03-27 NOTE — Telephone Encounter (Signed)
Rx sent to preferred pharmacy. Pt called back to request a review of labs taken at ED. Any recommendations or changes. Pt also wanted to know if she needed to be seen sooner.

## 2022-03-27 NOTE — Telephone Encounter (Signed)
Pt contacted office to advise she went to the ED yesterday and was advised her blood sugar was not low per her CGM [place] but actually running high. Josephine Igo was reading 92 blood sugar was 197. Pt was not confirming her readings previously. She is wanting to switch to the Dexcom CGM due to the issues with her CGM.
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
## 2022-03-27 NOTE — Telephone Encounter (Signed)
T, noted - let's send the Dexcom G7 sensor to her pharmacy.  She can check blood sugars with her phone.

## 2022-03-27 NOTE — Telephone Encounter (Signed)
I reviewed her labs.  Blood sugar was high, but not very much so, and other pertinent labs were normal.  Continue to keep an eye on blood sugars, adjust diet, and let us know if the sugars remain elevated.

## 2022-03-28 MED ORDER — ONETOUCH VERIO W/DEVICE KIT: PACK | 0 refills | Status: DC

## 2022-03-28 MED ORDER — ONETOUCH VERIO VI STRP: ORAL_STRIP | 2 refills | Status: DC

## 2022-03-28 NOTE — Telephone Encounter (Signed)
Pt called back to request a new meter to check blood sugars. Rx sent to preferred pharmacy.

## 2022-03-29 ENCOUNTER — Ambulatory Visit: Payer: PPO | Admitting: Family

## 2022-03-29 ENCOUNTER — Telehealth: Payer: Self-pay

## 2022-03-29 NOTE — Telephone Encounter (Signed)
Appt today w/ PCP.  

## 2022-03-29 NOTE — Telephone Encounter (Signed)
Nurse Assessment Nurse: Manson Passey, RN, Thea Silversmith Date/Time (Eastern Time): 03/28/2022 6:56:31 PM Confirm and document reason for call. If symptomatic, describe symptoms. ---Caller states she was dx with an urinary tract infection 2 days ago, difficulty passing urine, new incontinence, left prior to seeing doctor, not on antibiotics. Diabetic. Requesting to reschedule appointment tomorrow. Having issues with glucose monitoring device. Denies fever Does the patient have any new or worsening symptoms? ---Yes Will a triage be completed? ---Yes Related visit to physician within the last 2 weeks? ---Yes Does the PT have any chronic conditions? (i.e. diabetes, asthma, this includes High risk factors for pregnancy, etc.) ---Yes List chronic conditions. ---diabetic, PTSD, asthma, htn Is the patient pregnant or possibly pregnant? (Ask all females between the ages of 68-55) ---No Is this a behavioral health or substance abuse call? ---No Guidelines Guideline Title Affirmed Question Affirmed Notes Nurse Date/Time Lamount Cohen Time) Urinary Symptoms [1] Can't control passage of urine (i.e., urinary incontinence) Manson Passey, RN, Thea Silversmith 03/28/2022 7:00:43 PM PLEASE NOTE: All timestamps contained within this report are represented as [place] Standard Time. CONFIDENTIALTY NOTICE: This fax transmission is intended only for the addressee. It contains information that is legally privileged, confidential or otherwise protected from use or disclosure. If you are not the intended recipient, you are strictly prohibited from reviewing, disclosing, copying using or disseminating any of this information or taking any action in reliance on or regarding this information. If you have received this fax in error, please notify us immediately by telephone so that we can arrange for its return to [place]. Phone: [phone], Toll-Free: [phone], Fax: [phone] Page: 2 of 2 Call Id: [id number] Guidelines Guideline Title  Affirmed Question Affirmed Notes Nurse Date/Time Lamount Cohen Time) AND [2] newonset (< 2 weeks) or worsening Disp. Time Lamount Cohen Time) Disposition Final User 03/28/2022 7:04:26 PM See PCP within 24 Hours Yes Manson Passey RN, Thea Silversmith Final Disposition 03/28/2022 7:04:26 PM See PCP within 24 Hours Yes Manson Passey, RN, Gwynne Edinger Disagree/Comply Comply Caller Understands Yes PreDisposition Call Doctor Care Advice Given Per Guideline SEE PCP WITHIN 24 HOURS: * IF OFFICE WILL BE OPEN: You need to be examined within the next 24 hours. Call your doctor (or NP/PA) when the office opens and make an appointment. CARE ADVICE given per Urinary Symptoms (Adult) guideline. CALL BACK IF: * Fever occurs * You become worse Referrals REFERRED TO PCP OFFICE

## 2022-04-06 ENCOUNTER — Other Ambulatory Visit (HOSPITAL_COMMUNITY): Payer: Self-pay

## 2022-04-10 ENCOUNTER — Telehealth: Payer: Self-pay

## 2022-04-10 ENCOUNTER — Other Ambulatory Visit (HOSPITAL_COMMUNITY): Payer: Self-pay

## 2022-04-10 NOTE — Telephone Encounter (Signed)
Pt wants to switch to Dexcom. Please submit PA.

## 2022-04-10 NOTE — Telephone Encounter (Signed)
Patient Advocate Encounter   Received notification from [facility] that prior authorization is required for Dexcom G7  This insurance covers Freestyle [employer] CGM, and Josephine Igo is on record for this patient. No prior auth submitted at this time.  [name], CPhT Pharmacy Patient Advocate Specialist [facility] Patient Advocate Team Phone: [phone]   Fax: [phone]

## 2022-04-11 ENCOUNTER — Telehealth: Payer: Self-pay

## 2022-04-11 ENCOUNTER — Other Ambulatory Visit (HOSPITAL_COMMUNITY): Payer: Self-pay

## 2022-04-11 NOTE — Telephone Encounter (Signed)
Patient Advocate Encounter  Prior Authorization for Dexcom was denied using Part D coverage, but is APPROVED for Part B coverage.   Effective: 04/11/2022 to 09/16/2022  [name], CPhT Pharmacy Patient Advocate Specialist [facility] Patient Advocate Team Phone: [phone]   Fax: [phone]

## 2022-04-11 NOTE — Telephone Encounter (Signed)
Patient Advocate Encounter   Received message from office to submit prior authorization request for Dexcom G7.  Patient has previously been using Libre CGM but recent readings have been inaccurate. Attached phone encounter from 03/27/2022 to PA request and submitted on 04/11/2022.  Key [id number] Status is pending  [name], CPhT Pharmacy Patient Advocate Specialist [facility] Patient Advocate Team Phone: [phone]   Fax: [phone]

## 2022-04-23 ENCOUNTER — Other Ambulatory Visit (HOSPITAL_COMMUNITY): Payer: Self-pay | Admitting: Psychiatry

## 2022-04-23 ENCOUNTER — Other Ambulatory Visit: Payer: Self-pay | Admitting: Internal Medicine

## 2022-04-23 ENCOUNTER — Telehealth (HOSPITAL_COMMUNITY): Payer: Self-pay

## 2022-04-23 DIAGNOSIS — F3341 Major depressive disorder, recurrent, in partial remission: Secondary | ICD-10-CM

## 2022-04-23 MED ORDER — SERTRALINE HCL 100 MG PO TABS: ORAL_TABLET | ORAL | 1 refills | Status: DC

## 2022-04-23 NOTE — Telephone Encounter (Signed)
Medication problem -Telephone cal with Cassie, pharmacist at [facility] on [address] and then with pt, after she left a message she had misplaced her bottle of Sertraline, last filled 03/24/22 for a 90 day supply and has been unable to locate.  Patient verified she had not been able to find the bottle and has been without for one day.  Patient stated understanding, per discussion with pharmacist she may have to pay more for the medication due to her insurance would not cover again.  Discussed requesting Dr. Gilmore Laroche send in a 30 day order +1 refill to last until she could fill for 90 days again and that this might be cheaper, with pharmacist trying to assist with a coupon, until able to fill again under her insurance.  Patient agreed with this plan and agreed to send request to Dr. Gilmore Laroche.

## 2022-04-23 NOTE — Telephone Encounter (Signed)
Medicaiton management - Telephone message left for patient that her requested Sertraline 100 mg, two a day, #60 + 1 refill had been sent to her requested [facility] on [facility] and requested pt call back if any questions or problems filling.

## 2022-04-23 NOTE — Telephone Encounter (Signed)
Medication refill - Patient called back and requested the new Sertraline 30 day order +1 refill be sent to the [facility] at [facility] in [place] due to lower cost.  Agreed to send request to Dr. Gilmore Laroche.

## 2022-05-10 ENCOUNTER — Encounter (HOSPITAL_COMMUNITY): Payer: Self-pay | Admitting: Psychiatry

## 2022-05-10 ENCOUNTER — Ambulatory Visit (INDEPENDENT_AMBULATORY_CARE_PROVIDER_SITE_OTHER): Payer: HMO | Admitting: Psychiatry

## 2022-05-10 VITALS — BP 108/78 | Temp 97.8°F | Ht 60.5 in | Wt 169.0 lb

## 2022-05-10 DIAGNOSIS — F411 Generalized anxiety disorder: Secondary | ICD-10-CM | POA: Diagnosis not present

## 2022-05-10 DIAGNOSIS — F4310 Post-traumatic stress disorder, unspecified: Secondary | ICD-10-CM

## 2022-05-10 DIAGNOSIS — F3341 Major depressive disorder, recurrent, in partial remission: Secondary | ICD-10-CM | POA: Diagnosis not present
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
## 2022-06-15 ENCOUNTER — Other Ambulatory Visit: Payer: Self-pay | Admitting: Internal Medicine

## 2022-06-18 ENCOUNTER — Other Ambulatory Visit: Payer: Self-pay | Admitting: Internal Medicine

## 2022-06-26 ENCOUNTER — Other Ambulatory Visit (HOSPITAL_COMMUNITY): Payer: Self-pay

## 2022-06-26 DIAGNOSIS — F3341 Major depressive disorder, recurrent, in partial remission: Secondary | ICD-10-CM

## 2022-06-26 MED ORDER — SERTRALINE HCL 100 MG PO TABS: ORAL_TABLET | ORAL | 0 refills | Status: DC

## 2022-06-27 ENCOUNTER — Encounter: Payer: Self-pay | Admitting: Internal Medicine

## 2022-06-27 ENCOUNTER — Ambulatory Visit (INDEPENDENT_AMBULATORY_CARE_PROVIDER_SITE_OTHER): Payer: HMO | Admitting: Internal Medicine

## 2022-06-27 VITALS — BP 120/84 | HR 83 | Ht 60.5 in | Wt 171.0 lb

## 2022-06-27 DIAGNOSIS — Z794 Long term (current) use of insulin: Secondary | ICD-10-CM | POA: Diagnosis not present

## 2022-06-27 DIAGNOSIS — E782 Mixed hyperlipidemia: Secondary | ICD-10-CM

## 2022-06-27 DIAGNOSIS — E039 Hypothyroidism, unspecified: Secondary | ICD-10-CM | POA: Diagnosis not present

## 2022-06-27 DIAGNOSIS — Z23 Encounter for immunization: Secondary | ICD-10-CM

## 2022-06-27 DIAGNOSIS — E1165 Type 2 diabetes mellitus with hyperglycemia: Secondary | ICD-10-CM

## 2022-06-27 LAB — POCT GLYCOSYLATED HEMOGLOBIN (HGB A1C): Hemoglobin A1C: 7.2 % — AB (ref 4.0–5.6)

## 2022-06-27 LAB — TSH: TSH: 6.06 u[IU]/mL — ABNORMAL HIGH (ref 0.35–5.50)

## 2022-06-27 LAB — T4, FREE: Free T4: 0.88 ng/dL (ref 0.60–1.60)

## 2022-06-27 NOTE — Patient Instructions (Addendum)
Please increase: - Lantus 24 units in am  Continue: - Novolog 10-15 units before meals and 22-26 units before a high-carb meal   Please continue: - Levothyroxine 88 mg daily  Take the thyroid hormone every day, with water, at least 30 minutes before breakfast, separated by at least 4 hours from: - acid reflux medications - calcium - iron - multivitamins  Please return in 4 months.

## 2022-06-27 NOTE — Progress Notes (Signed)
Patient ID: [name], female   DOB: June 02, 1966, 56 y.o.   MRN: [id number]  HPI: [name] is a 56 y.o.-year-old female, returning for f/u for DM2, dx 2013, insulin-dependent, uncontrolled, with complications (diabetic retinopathy) and also hypothyroidism. Last visit 4 months ago.  Interim history: No increased urination, blurry vision, chest pain. She has occasional nausea, which is is chronic - only if delays a meal. He is not sleeping well - grieving for her boyfriend who passed away.  DM2: Reviewed HbA1c levels: Lab Results  Component Value Date   HGBA1C 6.2 (A) 02/23/2022   HGBA1C 6.4 (A) 10/19/2021   HGBA1C 5.9 (A) 06/15/2021   Pt is on a regimen of: - Lantus 12 >> ... 20 >> 25 >> 20 >> 20 units at bedtime >> in am - NovoLog 15-20 units 15 minutes before each meal, 24-26 >> 22-25 for a high carb meal We stopped Cycloset in 06/2020. She stopped Rybelsus 7 mg >> stopped 2/2 N and D >> diarrhea stopped She did not start Welchol 2 tabs 2x a day - added 03/2017 - too expensive On Turmeric, alpha-lipoic acid + 660 mcg biotin per day, Garlicin. We stopped Glipizide when we started NovoLog.  She has various intolerances to different diabetic medicines: Metformin ER >> in the past, she had gastric discomfort and severe diarrhea (loss of bowel control).  However, we restarted this 08/2017 and she is tolerating this well. Januvia >> tried for 1-2 months >> left chest pain She was wondering if she could take Invokana, however she had multiple vaginal yeast infections. She Also had nocturia 3-4 x a night.  She had reactive hypoglycemia symptoms while on Quercetin (was taking it for allergies) >> we stopped this it is known to increase insulin production by the pancreas.  She checks her sugars more than 4 times a day with her libre CGM:   Previously:   Previously:   Lowest: LO x2 >> 60s >> 47 >> 40s >> 100. Highest: 455 >> .Marland KitchenMarland Kitchen 350 (mints) >> 200s >> >250.  She has many  food intolerances: - Gluten >> cannot eat bread or pastry.  - raw vegetables, beans, cruciferous vegetables, fruit >> diarrhea.  - No dairy but, cereals  -at night -now off ice cream In the past, no she was taking up to 36 units of NovoLog for ice cream.  I strongly advised her to stop this in the past. She does not digest fat well after her cholecystectomy in 2011.   Meter: One Touch Mini  No CKD, last BUN/creatinine was:  Lab Results  Component Value Date   BUN 15 03/26/2022   CREATININE 0.71 03/26/2022  On irbesartan.  + HL: Lab Results  Component Value Date   CHOL 346 (H) 02/22/2022   HDL 130.70 02/22/2022   LDLCALC 191 (H) 02/22/2022   LDLDIRECT 205.0 01/24/2016   TRIG 121.0 02/22/2022   CHOLHDL 3 02/22/2022  Previous cholesterol levels were: 325/197/132/154.   She could not tolerate pravastatin or Livalo due to muscle aches.  She refused a referral to lipid clinic in the past for PCSK9 inhibitors.  On fluvastatin XL (daily now, restarted 2 week ago) + CoQ10. I suggested Zetia but she refused due to the possibility of developing diarrhea on it.  - last dilated eye exam was in 06/2021: + DR. Dr. Lennox Russell. Coming up tomorrow.  - + Numbness and tingling in her legs.  She is on the R enantiomer of alpha-lipoic acid.  Last foot exam  10/19/2021.  She also has a history of PCOS- she has seen [name] at [facility] in the past - note from 08/26/2012: perimenopause, and at that time she was on Yasmin, subsequently changed to Trosky. She sees [name] with OB/GYN. She has PTSD, MDD, insomnia, anxiety, asthma, HTN, GERD, status post cholecystectomy, anemia, transaminitis. She also had increased uterine bleeding (on Necon). She sawan acupuncturist. She takes Nettle powder >> helped her with diarrhea, anemia, insomnia. 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 Also, Undecylenic acid and mastic gum, no carbs x 1-2 months.  She has IBS >>  better on L- plantarum, S.bulardii - Brevibacillus Laterosporus 20 min before b'fast.   Hypothyroidism  -Diagnosed in 02/2018. We started levothyroxine 03/2018.  Pt is on levothyroxine 88 mcg daily, dose increased at last visit, taken: - in am - fasting - at least 30 min from b'fast - + calcium in butyrate later in the day - no iron - no multivitamins - no PPIs - not on Biotin On Kelp - 1 capsule a day.  Reviewed her TFTs: Lab Results  Component Value Date   TSH 8.33 (H) 02/22/2022   TSH 2.02 02/06/2021   TSH 2.82 07/01/2020   TSH 6.44 (H) 03/22/2020   TSH 2.96 04/01/2019   TSH 1.26 07/30/2018   TSH 10.64 (H) 04/04/2018   TSH 4.80 (H) 03/13/2018   TSH 2.91 01/24/2016   TSH 2.322 02/26/2013   FREET4 0.91 02/22/2022   FREET4 0.63 02/06/2021   FREET4 0.78 07/01/2020   FREET4 0.91 03/22/2020   FREET4 0.60 04/01/2019   FREET4 0.84 07/30/2018   FREET4 0.72 04/04/2018   T3FREE 2.6 04/04/2018   Her antithyroid antibodies were not elevated: Component     Latest Ref Rng & Units 04/04/2018  Thyroglobulin Ab     < or = 1 IU/mL <1  Thyroperoxidase Ab SerPl-aCnc     <9 IU/mL 2   No FH of thyroid cancer. No h/o radiation tx to head or neck. No Biotin use. No recent steroids use.   Pt denies: - feeling nodules in neck - hoarseness - dysphagia - choking  She started vitamin D 5000 units.  ROS: + See HPI Neurological: no tremors/+ numbness/+ tingling/no dizziness  I reviewed pt's medications, allergies, PMH, social hx, family hx, and changes were documented in the history of present illness. Otherwise, unchanged from my initial visit note.  Past Medical History:  Diagnosis Date   Abnormal Papanicolaou smear of cervix with positive human papilloma virus (HPV) test 02/2017   Anxiety    Asthma    Depression    Diabetes mellitus type II    Food intolerance    Headache    High blood pressure    IBS (irritable bowel syndrome)    Insomnia    Migraines    PCOS  (polycystic ovarian syndrome)    PTSD (post-traumatic stress disorder)    Seasonal allergies    Thyroid disease    Past Surgical History:  Procedure Laterality Date   CHOLECYSTECTOMY     GALLBLADDER SURGERY     Social History   Socioeconomic History   Marital status: Single    Spouse name: Not on file   Number of children: 0   Years of education: 66   Highest education level: Master's degree (e.g., MA, MS, MEng, MEd, MSW, MBA)  Occupational History   Occupation: Long Term Disability    Comment: PTSD  Tobacco Use  Smoking status: Never   Smokeless tobacco: Never  Vaping Use   Vaping Use: Never used  Substance and Sexual Activity   Alcohol use: Not Currently    Alcohol/week: 0.5 standard drinks of alcohol    Types: 1 Standard drinks or equivalent per week   Drug use: No   Sexual activity: Not Currently    Partners: Male    Birth control/protection: Patch    Comment: 1st intercourse- 16, partners- 16, current partner- 52yr  Other Topics Concern   Not on file  Social History Narrative   Regular exercise: yes, walk   Caffeine use: occasionally tea   Fun: Social group events, walking her dogs, movies   Denies abuse and feels safe where she lives.    Social Determinants of Health   Financial Resource Strain: Low Risk  (01/08/2022)   Overall Financial Resource Strain (CARDIA)    Difficulty of Paying Living Expenses: Not hard at all  Food Insecurity: No Food Insecurity (01/08/2022)   Hunger Vital Sign    Worried About Running Out of Food in the Last Year: Never true    Ran Out of Food in the Last Year: Never true  Transportation Needs: No Transportation Needs (01/08/2022)   PRAPARE - THydrologist(Medical): No    Lack of Transportation (Non-Medical): No  Physical Activity: Inactive (01/08/2022)   Exercise Vital Sign    Days of Exercise per Week: 0 days    Minutes of Exercise per Session: 0 min  Stress: No Stress Concern Present (01/08/2022)    [place]   Feeling of Stress : Not at all  Social Connections: Socially Isolated (01/08/2022)   Social Connection and Isolation Panel [NHANES]    Frequency of Communication with Friends and Family: Twice a week    Frequency of Social Gatherings with Friends and Family: More than three times a week    Attends Religious Services: Never    AMarine scientistor Organizations: No    Attends CArchivistMeetings: Never    Marital Status: Never married  Intimate Partner Violence: Not At Risk (01/08/2022)   Humiliation, Afraid, Rape, and Kick questionnaire    Fear of Current or Ex-Partner: No    Emotionally Abused: No    Physically Abused: No    Sexually Abused: No   Current Outpatient Medications on File Prior to Visit  Medication Sig Dispense Refill   albuterol (VENTOLIN HFA) 108 (90 Base) MCG/ACT inhaler INHALE 2 PUFFS BY MOUTH EVERY 4 TO 6 HOURS AS DIRECTED(RESCUE) 42.5 g 0   Alpha-Lipoic Acid 600 MG CAPS Take 300 mg by mouth 2 (two) times daily.      atenolol (TENORMIN) 100 MG tablet TAKE 1 TABLET(100 MG) BY MOUTH TWICE DAILY 180 tablet 3   BD PEN NEEDLE NANO 2ND GEN 32G X 4 MM MISC USE 4 TIMES DAILY 400 each 3   Blood Glucose Monitoring Suppl (ONE TOUCH ULTRA MINI) w/Device KIT Use to check blood sugar 2 times a day. 1 kit 0   Blood Glucose Monitoring Suppl (ONETOUCH VERIO) w/Device KIT Use as instructed to check blood sugar 4X daily 1 kit 0   clobetasol cream (TEMOVATE) 03.66% Apply 1 application topically 2 (two) times daily. 30 g 1   clonazePAM (KLONOPIN) 0.5 MG tablet TAKE 1/2 TABLET(0.25 MG) BY MOUTH DAILY 15 tablet 0   Continuous Blood Gluc Receiver (DEXCOM G7 RECEIVER) DEVI Use as instructed  to check blood sugar. 1 each 0   Continuous Blood Gluc Sensor (DEXCOM G7 SENSOR) MISC Use as instructed to check blood sugar. Change every 10 days. 9 each 3   Continuous Blood Gluc Sensor (FREESTYLE LIBRE 14 DAY  SENSOR) MISC Use for continuous glucose monitoring. 6 each 3   Digestive Enzymes (ENZYME DIGEST PO) Take by mouth.     fexofenadine (ALLEGRA) 180 MG tablet Take 180 mg by mouth daily.     fluvastatin XL (LESCOL XL) 80 MG 24 hr tablet TAKE 1 TABLET(80 MG) BY MOUTH DAILY 90 tablet 3   glucose blood (ONETOUCH ULTRA) test strip Use to check blood sugar once a day. 100 strip 11   glucose blood (ONETOUCH VERIO) test strip Use as instructed to check blood sugar 4X daily 300 each 2   Insulin Aspart FlexPen (NOVOLOG) 100 UNIT/ML ADMINISTER 10 TO 30 UNITS UNDER THE SKIN THREE TIMES DAILY BEFORE MEALS 90 mL 2   irbesartan (AVAPRO) 75 MG tablet TAKE 1 TABLET(75 MG) BY MOUTH DAILY 90 tablet 3   LANTUS SOLOSTAR 100 UNIT/ML Solostar Pen INJECT 25 UNITS AT BEDTIME 30 mL 0   levothyroxine (SYNTHROID) 88 MCG tablet TAKE 1 TABLET(75 MCG) BY MOUTH DAILY 45 tablet 5   lidocaine (XYLOCAINE) 2 % jelly Apply 1 application topically as needed. 30 mL 4   methocarbamol (ROBAXIN) 500 MG tablet TAKE 1 TABLET(500 MG) BY MOUTH EVERY 8 HOURS AS NEEDED FOR MUSCLE SPASMS 60 tablet 1   naphazoline-pheniramine (NAPHCON-A) 0.025-0.3 % ophthalmic solution Place 1 drop into both eyes 2 (two) times daily.     norelgestromin-ethinyl estradiol Marilu Favre) 150-35 MCG/24HR transdermal patch Place patch onto the skin once weekly continuously. 12 patch 4   OVER THE COUNTER MEDICATION 1 capsule daily. Probiotic renew life      OVER THE COUNTER MEDICATION      sertraline (ZOLOFT) 100 MG tablet TAKE 2 TABLETS BY MOUTH EVERY DAY 90 tablet 0   [DISCONTINUED] EFFEXOR XR 150 MG 24 hr capsule 375 mg daily.      No current facility-administered medications on file prior to visit.   Allergies  Allergen Reactions   Latex Itching and Swelling   Fluticasone     Nose bleeds   Metformin And Related     GI side effects   Pravastatin     myalgias   Family History  Problem Relation Age of Onset   Bipolar disorder Mother    Hypertension Mother     Diabetes Mother    Alzheimer's disease Father    Hypertension Father    Alzheimer's disease Maternal Grandmother    Heart disease Paternal Grandfather    PE: BP 120/84 (BP Location: Right Arm, Patient Position: Sitting, Cuff Size: Normal)   Pulse 83   Ht 5' 0.5" (1.537 m)   Wt 171 lb (77.6 kg)   SpO2 96%   BMI 32.85 kg/m    Wt Readings from Last 3 Encounters:  06/27/22 171 lb (77.6 kg)  02/22/22 171 lb 6.4 oz (77.7 kg)  01/08/22 170 lb (77.1 kg)   Constitutional: overweight, in NAD Eyes:  EOMI, no exophthalmos ENT: no neck masses, no cervical lymphadenopathy Cardiovascular: RRR, No MRG Respiratory: CTA B Musculoskeletal: no deformities Skin:no rashes Neurological: no tremor with outstretched hands  ASSESSMENT: 1. DM2, insulin-dependent, fairly well controlled, with complications - DR  2. HL - Very high LDL - high HDL - slightly high TG  3.  Hypothyroidism  PLAN:  1. DM2 - Patient with insulin-dependent  type 2 diabetes, with suboptimal control, mostly due to many medication intolerances and binge eating disorder.  She is currently on basal-bolus insulin regimen, with a lower dose of Lantus and higher doses of NovoLog, adjusted based on the size of her meals or snacks.  At last visit, reviewing her CGM trends, sugars were better, improved from the previous visit.  She mentions that she started to see better blood sugars after she moved her Lantus to mornings.  She did appear to have some lower blood sugars in the 50s and sometimes even lower.  We discussed taking NovoLog at a lower dose before regular meals but slightly higher dose before high carb meals.  We did not change the regimen otherwise.  HbA1c at that time was 6.2%, improved. CGM interpretation: -At today's visit, we reviewed her CGM downloads: It appears that 70% of values are in target range (goal >70%), while 30% are higher than 180 (goal <25%), and 0% are lower than 70 (goal <4%).  The calculated average blood  sugar is 165.  The projected HbA1c for the next 3 months (GMI) is 7.3%. -Reviewing the CGM trends, it appears that sugars are fluctuating around the upper limit of the target range slightly lower.  Sugars after lunch and occasionally and after dinner to increase above target, however, the abdomen excursion is not very large.  I suggested to increase the dose of Lantus so we can bring all of her blood sugars to a lower range but I believe that her NovoLog dose is adequate.  We will continue this for now. - I advised her to:  Patient Instructions  Please increase: - Lantus 24 units in am  Continue: - Novolog 10-15 units before meals and 22-26 units before a high-carb meal   Please continue: - Levothyroxine 88 mg daily  Take the thyroid hormone every day, with water, at least 30 minutes before breakfast, separated by at least 4 hours from: - acid reflux medications - calcium - iron - multivitamins  Please return in 4 months.  - we checked her HbA1c: 7.2% (higher) - advised to check sugars at different times of the day - 4x a day, rotating check times - advised for yearly eye exams >> she is UTD - return to clinic in 4 months  2. HL -The latest lipid panel from 02/2022: Very high LDL: Lab Results  Component Value Date   CHOL 346 (H) 02/22/2022   HDL 130.70 02/22/2022   LDLCALC 191 (H) 02/22/2022   LDLDIRECT 205.0 01/24/2016   TRIG 121.0 02/22/2022   CHOLHDL 3 02/22/2022  -She could not tolerate Livalo.  I also suggested Zetia but she declined that she read that this could cause diarrhea. -She declined a referral to the lipid clinic for PCSK9 inhibitors. -On fluvastatin XL, taken inconsistently.  At last visit, after the above results returned, I strongly advised her to take it every day -She continues to eat ice cream and other sweets  3.   Hypothyroidism - latest thyroid labs reviewed with pt. >> normal: Lab Results  Component Value Date   TSH 8.33 (H) 02/22/2022  - she  continues on LT4 88 mcg daily, dose increased after the above results returned  - pt feels good on this dose. - we discussed about taking the thyroid hormone every day, with water, >30 minutes before breakfast, separated by >4 hours from acid reflux medications, calcium, iron, multivitamins. Pt. is taking it correctly. - will check thyroid tests today: TSH and fT4 -  If labs are abnormal, she will need to return for repeat TFTs in 1.5 months  + Flu shot today  Component     Latest Ref Rng 06/27/2022  TSH     0.35 - 5.50 uIU/mL 6.06 (H)   T4,Free(Direct)     0.60 - 1.60 ng/dL 0.88     TSH improved, but still elevated.  Will increase levothyroxine dose to 100 mcg daily and repeat the labs at next OV.  Philemon Kingdom, MD PhD [facility]

## 2022-06-28 DIAGNOSIS — H2513 Age-related nuclear cataract, bilateral: Secondary | ICD-10-CM | POA: Diagnosis not present

## 2022-06-28 DIAGNOSIS — E113291 Type 2 diabetes mellitus with mild nonproliferative diabetic retinopathy without macular edema, right eye: Secondary | ICD-10-CM | POA: Diagnosis not present

## 2022-06-28 DIAGNOSIS — H5213 Myopia, bilateral: Secondary | ICD-10-CM | POA: Diagnosis not present

## 2022-06-28 MED ORDER — LEVOTHYROXINE SODIUM 100 MCG PO TABS
ORAL_TABLET | ORAL | 3 refills | Status: DC
Start: 2022-06-28 — End: 2023-07-15

## 2022-07-04 ENCOUNTER — Telehealth: Payer: Self-pay | Admitting: Internal Medicine

## 2022-07-04 DIAGNOSIS — E1165 Type 2 diabetes mellitus with hyperglycemia: Secondary | ICD-10-CM

## 2022-07-04 MED ORDER — FREESTYLE LIBRE 14 DAY SENSOR MISC: 3 refills | Status: DC

## 2022-07-04 NOTE — Telephone Encounter (Signed)
RX now sent to preferred pharmacy.  

## 2022-07-04 NOTE — Telephone Encounter (Signed)
MEDICATION: Free Style Libre 14-Day Sensor  PHARMACY:  [facility] [phone] - [place], [place] - [address] AT [address]  HAS THE PATIENT CONTACTED THEIR PHARMACY? YES   IS THIS A 90 DAY SUPPLY : YES  IS PATIENT OUT OF MEDICATION: NO  IF NOT; HOW MUCH IS LEFT: ONE  LAST APPOINTMENT DATE: @10 /07/2022  NEXT APPOINTMENT DATE:@2 /16/2024  DO WE HAVE YOUR PERMISSION TO LEAVE A DETAILED MESSAGE?: yes - [phone]  OTHER COMMENTS:    **Let patient know to contact pharmacy at the end of the day to make sure medication is ready. **  ** Please notify patient to allow 48-72 hours to process**  **Encourage patient to contact the pharmacy for refills or they can request refills through [facility]**

## 2022-07-31 ENCOUNTER — Other Ambulatory Visit: Payer: Self-pay

## 2022-07-31 DIAGNOSIS — E1165 Type 2 diabetes mellitus with hyperglycemia: Secondary | ICD-10-CM

## 2022-07-31 MED ORDER — INSULIN ASPART FLEXPEN 100 UNIT/ML ~~LOC~~ SOPN: PEN_INJECTOR | SUBCUTANEOUS | 1 refills | Status: DC

## 2022-08-13 ENCOUNTER — Other Ambulatory Visit: Payer: Self-pay

## 2022-08-13 ENCOUNTER — Other Ambulatory Visit (HOSPITAL_COMMUNITY): Payer: Self-pay

## 2022-08-13 DIAGNOSIS — F3341 Major depressive disorder, recurrent, in partial remission: Secondary | ICD-10-CM

## 2022-08-13 DIAGNOSIS — E1165 Type 2 diabetes mellitus with hyperglycemia: Secondary | ICD-10-CM

## 2022-08-13 MED ORDER — LANTUS SOLOSTAR 100 UNIT/ML ~~LOC~~ SOPN: 24.0000 [IU] | PEN_INJECTOR | Freq: Every day | SUBCUTANEOUS | 0 refills | Status: DC

## 2022-08-13 MED ORDER — SERTRALINE HCL 100 MG PO TABS: ORAL_TABLET | ORAL | 0 refills | Status: DC

## 2022-08-13 MED ORDER — NOVOLOG FLEXPEN 100 UNIT/ML ~~LOC~~ SOPN: 10.0000 [IU] | PEN_INJECTOR | Freq: Three times a day (TID) | SUBCUTANEOUS | 0 refills | Status: DC

## 2022-08-16 ENCOUNTER — Telehealth (HOSPITAL_COMMUNITY): Payer: Self-pay

## 2022-08-16 ENCOUNTER — Other Ambulatory Visit (HOSPITAL_COMMUNITY): Payer: Self-pay | Admitting: Psychiatry

## 2022-08-16 MED ORDER — CLONAZEPAM 0.5 MG PO TABS: ORAL_TABLET | ORAL | 0 refills | Status: DC

## 2022-08-16 NOTE — Telephone Encounter (Signed)
Sent electronically 

## 2022-08-16 NOTE — Telephone Encounter (Signed)
Medication refill - Fax received from patient's [facility] for a new Clonazepam order, last provided 03/27/22 for #15, taking 1/2 per day as needed and no refills. Pt last seen 05/10/22 and returns next on 11/14/22.

## 2022-08-17 ENCOUNTER — Ambulatory Visit (INDEPENDENT_AMBULATORY_CARE_PROVIDER_SITE_OTHER): Payer: HMO | Admitting: Family

## 2022-08-17 ENCOUNTER — Encounter: Payer: Self-pay | Admitting: Family

## 2022-08-17 VITALS — BP 138/84 | HR 65 | Temp 97.5°F | Resp 18 | Ht 60.6 in | Wt 171.6 lb

## 2022-08-17 DIAGNOSIS — I10 Essential (primary) hypertension: Secondary | ICD-10-CM

## 2022-08-17 DIAGNOSIS — Z0000 Encounter for general adult medical examination without abnormal findings: Secondary | ICD-10-CM | POA: Diagnosis not present

## 2022-08-17 MED ORDER — IRBESARTAN 75 MG PO TABS: ORAL_TABLET | ORAL | 3 refills | Status: DC

## 2022-08-17 MED ORDER — ATENOLOL 100 MG PO TABS: ORAL_TABLET | ORAL | 3 refills | Status: DC

## 2022-08-17 NOTE — Progress Notes (Signed)
[name] is a 56 y.o. female with the following history as recorded in EpicCare:  Patient Active Problem List   Diagnosis Date Noted   Class 1 obesity 12/22/2019   ASCUS with positive high risk HPV cervical 03/13/2017   Type 2 diabetes mellitus with hyperglycemia, without long-term current use of insulin ([place]) 03/28/2016   Routine general medical examination at a health care facility 01/24/2016   Acquired hypothyroidism 01/24/2016   Anemia 04/14/2014   Hyperlipidemia 08/18/2013   Essential hypertension 08/26/2012   Irregular menses 08/07/2012   History of abnormal Pap smear 08/07/2012   GERD (gastroesophageal reflux disease) 08/07/2012   Asthma, mild persistent 08/07/2012   Migraine 08/07/2012   History of PCOS 08/07/2012   Genital warts 08/07/2012   S/P cholecystectomy 08/07/2012   Seasonal and perennial allergic rhinitis 08/07/2012   Severe episode of recurrent major depressive disorder ([place]) 12/06/2011   PTSD (post-traumatic stress disorder) 11/02/2011    Current Outpatient Medications  Medication Sig Dispense Refill   albuterol (VENTOLIN HFA) 108 (90 Base) MCG/ACT inhaler INHALE 2 PUFFS BY MOUTH EVERY 4 TO 6 HOURS AS DIRECTED(RESCUE) 42.5 g 0   Alpha-Lipoic Acid 600 MG CAPS Take 300 mg by mouth 2 (two) times daily.      BD PEN NEEDLE NANO 2ND GEN 32G X 4 MM MISC USE 4 TIMES DAILY 400 each 3   clobetasol cream (TEMOVATE) 1.61 % Apply 1 application topically 2 (two) times daily. 30 g 1   clonazePAM (KLONOPIN) 0.5 MG tablet TAKE 1/2 TABLET(0.25 MG) BY MOUTH DAILY 15 tablet 0   Digestive Enzymes (ENZYME DIGEST PO) Take by mouth.     fexofenadine (ALLEGRA) 180 MG tablet Take 180 mg by mouth daily.     fluvastatin XL (LESCOL XL) 80 MG 24 hr tablet TAKE 1 TABLET(80 MG) BY MOUTH DAILY 90 tablet 3   glucose blood (ONETOUCH ULTRA) test strip Use to check blood sugar once a day. 100 strip 11   glucose blood (ONETOUCH VERIO) test strip Use as instructed to check blood sugar 4X daily  300 each 2   Insulin Aspart FlexPen (NOVOLOG) 100 UNIT/ML ADMINISTER 10 TO 30 UNITS UNDER THE SKIN THREE TIMES DAILY BEFORE MEALS 90 mL 1   LANTUS SOLOSTAR 100 UNIT/ML Solostar Pen Inject 24 Units into the skin daily. 30 mL 0   levothyroxine (SYNTHROID) 100 MCG tablet TAKE 1 TABLET(100 MCG) BY MOUTH DAILY 90 tablet 3   lidocaine (XYLOCAINE) 2 % jelly Apply 1 application topically as needed. 30 mL 4   methocarbamol (ROBAXIN) 500 MG tablet TAKE 1 TABLET(500 MG) BY MOUTH EVERY 8 HOURS AS NEEDED FOR MUSCLE SPASMS 60 tablet 1   naphazoline-pheniramine (NAPHCON-A) 0.025-0.3 % ophthalmic solution Place 1 drop into both eyes 2 (two) times daily.     NOVOLOG FLEXPEN 100 UNIT/ML FlexPen Inject 10-30 Units into the skin 3 (three) times daily with meals. 90 mL 0   OVER THE COUNTER MEDICATION 1 capsule daily. Probiotic renew life      OVER THE COUNTER MEDICATION      sertraline (ZOLOFT) 100 MG tablet TAKE 2 TABLETS BY MOUTH EVERY DAY 180 tablet 0   atenolol (TENORMIN) 100 MG tablet TAKE 1 TABLET(100 MG) BY MOUTH TWICE DAILY 180 tablet 3   Blood Glucose Monitoring Suppl (ONE TOUCH ULTRA MINI) w/Device KIT Use to check blood sugar 2 times a day. (Patient not taking: Reported on 08/17/2022) 1 kit 0   Blood Glucose Monitoring Suppl (ONETOUCH VERIO) w/Device KIT Use as  instructed to check blood sugar 4X daily (Patient not taking: Reported on 08/17/2022) 1 kit 0   Continuous Blood Gluc Receiver (DEXCOM G7 RECEIVER) DEVI Use as instructed to check blood sugar. (Patient not taking: Reported on 08/17/2022) 1 each 0   Continuous Blood Gluc Sensor (DEXCOM G7 SENSOR) MISC Use as instructed to check blood sugar. Change every 10 days. (Patient not taking: Reported on 08/17/2022) 9 each 3   Continuous Blood Gluc Sensor (FREESTYLE LIBRE 14 DAY SENSOR) MISC Use for continuous glucose monitoring. (Patient not taking: Reported on 08/17/2022) 6 each 3   irbesartan (AVAPRO) 75 MG tablet TAKE 1 TABLET(75 MG) BY MOUTH DAILY 90 tablet 3    No current facility-administered medications for this visit.    Allergies: Latex, Fluticasone, Metformin and related, and Pravastatin  Past Medical History:  Diagnosis Date   Abnormal Papanicolaou smear of cervix with positive human papilloma virus (HPV) test 02/2017   Anxiety    Asthma    Depression    Diabetes mellitus type II    Food intolerance    Headache    High blood pressure    IBS (irritable bowel syndrome)    Insomnia    Migraines    PCOS (polycystic ovarian syndrome)    PTSD (post-traumatic stress disorder)    Seasonal allergies    Thyroid disease     Past Surgical History:  Procedure Laterality Date   CHOLECYSTECTOMY     GALLBLADDER SURGERY      Family History  Problem Relation Age of Onset   Bipolar disorder Mother    Hypertension Mother    Diabetes Mother    Alzheimer's disease Father    Hypertension Father    Alzheimer's disease Maternal Grandmother    Heart disease Paternal Grandfather     Social History   Tobacco Use   Smoking status: Never   Smokeless tobacco: Never  Substance Use Topics   Alcohol use: Not Currently    Alcohol/week: 0.5 standard drinks of alcohol    Types: 1 Standard drinks or equivalent per week    Subjective:  Presents for yearly CPE: majority of health is managed by her specialists including endocrinology, GYN and psychiatrist;  Up to date on dentist and eye exam; Patient prefers to do her mammogram every 2 years- due again in 2024;   Review of Systems  Constitutional: Negative.   HENT: Negative.    Eyes: Negative.   Respiratory: Negative.    Cardiovascular: Negative.   Gastrointestinal: Negative.   Genitourinary: Negative.   Musculoskeletal: Negative.   Skin: Negative.   Neurological: Negative.   Endo/Heme/Allergies: Negative.   Psychiatric/Behavioral: Negative.        Objective:  Vitals:   08/17/22 1038  BP: 138/84  Pulse: 65  Resp: 18  Temp: (!) 97.5 F (36.4 C)  SpO2: 98%  Weight: 171 lb 9.6 oz  (77.8 kg)  Height: 5' 0.6" (1.539 m)    General: Well developed, well nourished, in no acute distress  Skin : Warm and dry.  Head: Normocephalic and atraumatic  Eyes: Sclera and conjunctiva clear; pupils round and reactive to light; extraocular movements intact  Ears: External normal; canals clear; tympanic membranes normal  Oropharynx: Pink, supple. No suspicious lesions  Neck: Supple without thyromegaly, adenopathy  Lungs: Respirations unlabored; clear to auscultation bilaterally without wheeze, rales, rhonchi  CVS exam: normal rate and regular rhythm.  Abdomen: Soft; nontender; nondistended; normoactive bowel sounds; no masses or hepatosplenomegaly  Musculoskeletal: No deformities; no active joint inflammation  Extremities: No edema, cyanosis, clubbing  Vessels: Symmetric bilaterally  Neurologic: Alert and oriented; speech intact; face symmetrical; moves all extremities well; CNII-XII intact without focal deficit   Assessment:  1. PE (physical exam), annual   2. Essential hypertension     Plan:  Age appropriate preventive healthcare needs addressed; encouraged regular eye doctor and dental exams; encouraged regular exercise; will update labs and refills as needed today; follow-up in 1 year, sooner prn.   No follow-ups on file.  No orders of the defined types were placed in this encounter.   Requested Prescriptions   Signed Prescriptions Disp Refills   atenolol (TENORMIN) 100 MG tablet 180 tablet 3    Sig: TAKE 1 TABLET(100 MG) BY MOUTH TWICE DAILY   irbesartan (AVAPRO) 75 MG tablet 90 tablet 3    Sig: TAKE 1 TABLET(75 MG) BY MOUTH DAILY

## 2022-09-13 ENCOUNTER — Other Ambulatory Visit: Payer: Self-pay | Admitting: Internal Medicine

## 2022-09-13 DIAGNOSIS — Z794 Long term (current) use of insulin: Secondary | ICD-10-CM

## 2022-09-27 ENCOUNTER — Encounter: Payer: Self-pay | Admitting: Obstetrics & Gynecology

## 2022-09-27 ENCOUNTER — Ambulatory Visit (INDEPENDENT_AMBULATORY_CARE_PROVIDER_SITE_OTHER): Payer: HMO | Admitting: Obstetrics & Gynecology

## 2022-09-27 VITALS — BP 112/72 | HR 56

## 2022-09-27 DIAGNOSIS — E039 Hypothyroidism, unspecified: Secondary | ICD-10-CM

## 2022-09-27 DIAGNOSIS — N912 Amenorrhea, unspecified: Secondary | ICD-10-CM | POA: Diagnosis not present

## 2022-09-27 DIAGNOSIS — Z9189 Other specified personal risk factors, not elsewhere classified: Secondary | ICD-10-CM | POA: Diagnosis not present

## 2022-09-27 DIAGNOSIS — Z01419 Encounter for gynecological examination (general) (routine) without abnormal findings: Secondary | ICD-10-CM

## 2022-09-27 DIAGNOSIS — Z8619 Personal history of other infectious and parasitic diseases: Secondary | ICD-10-CM

## 2022-09-27 NOTE — Progress Notes (Signed)
[name] 12-09-1965 [id number]   History:    57 y.o.   G0 Single   RP:  Established patient presenting for annual gyn exam    HPI: Stopped continuous Xulane 4 months ago.  No menses or BTB since then. No hot flushes/night sweats.  Planning to consult for Chinese herbal medicine.  No pelvic pain.  Abstinent.  No h/o abnormal Pap.  Pap Neg in 2020.  Repeat Pap at 7 years.  Breasts normal.  Mammo 08/2022 Neg, will schedule screening mammo now.  Urine/BMs normal. Walks her dogs occasionally. Health labs with Fam MD. On Atenolol for cHTN. DM type 2 now on Insulin.  Hypothyroidism, TSH 6.06 06/27/2022, Synthroid increased to 100 microgram daily by Endocrino.  [place] 2015.   Past medical history,surgical history, family history and social history were all reviewed and documented in the EPIC chart.  Gynecologic History No LMP recorded. (Menstrual status: Perimenopausal).  Obstetric History OB History  Gravida Para Term Preterm AB Living  0 0 0 0 0 0  SAB IAB Ectopic Multiple Live Births  0 0 0 0       ROS: A ROS was performed and pertinent positives and negatives are included in the history. GENERAL: No fevers or chills. HEENT: No change in vision, no earache, sore throat or sinus congestion. NECK: No pain or stiffness. CARDIOVASCULAR: No chest pain or pressure. No palpitations. PULMONARY: No shortness of breath, cough or wheeze. GASTROINTESTINAL: No abdominal pain, nausea, vomiting or diarrhea, melena or bright red blood per rectum. GENITOURINARY: No urinary frequency, urgency, hesitancy or dysuria. MUSCULOSKELETAL: No joint or muscle pain, no back pain, no recent trauma. DERMATOLOGIC: No rash, no itching, no lesions. ENDOCRINE: No polyuria, polydipsia, no heat or cold intolerance. No recent change in weight. HEMATOLOGICAL: No anemia or easy bruising or bleeding. NEUROLOGIC: No headache, seizures, numbness, tingling or weakness. PSYCHIATRIC: No depression, no loss of interest in normal  activity or change in sleep pattern.     Exam:   BP 112/72   Pulse (!) 56   SpO2 99%   There is no height or weight on file to calculate BMI.  General appearance : Well developed well nourished female. No acute distress HEENT: Eyes: no retinal hemorrhage or exudates,  Neck supple, trachea midline, no carotid bruits, no thyroidmegaly Lungs: Clear to auscultation, no rhonchi or wheezes, or rib retractions  Heart: Regular rate and rhythm, no murmurs or gallops Breast:Examined in sitting and supine position were symmetrical in appearance, no palpable masses or tenderness,  no skin retraction, no nipple inversion, no nipple discharge, no skin discoloration, no axillary or supraclavicular lymphadenopathy Abdomen: no palpable masses or tenderness, no rebound or guarding Extremities: no edema or skin discoloration or tenderness  Pelvic: Vulva: Normal             Vagina: No gross lesions or discharge  Cervix: No gross lesions or discharge  Uterus  AV, normal size, shape and consistency, non-tender and mobile  Adnexa  Without masses or tenderness  Anus: Normal   Assessment/Plan:  57 y.o. female for annual exam   1. Well female exam with routine gynecological exam Stopped continuous Xulane 4 months ago.  No menses or BTB since then. No hot flushes/night sweats.  Planning to consult for Chinese herbal medicine.  No pelvic pain.  Abstinent.  No h/o abnormal Pap.  Pap Neg in 2020.  Repeat Pap at 5 years.  Breasts normal.  Mammo 08/2022 Neg, will schedule screening mammo now.  Urine/BMs normal. Walks her dogs occasionally. Health labs with Fam MD. On Atenolol for cHTN. DM type 2 now on Insulin.  Hypothyroidism, TSH 6.06 06/27/2022, Synthroid increased to 100 microgram daily by Endocrino.  [place] 2015.  2. Amenorrhea Stopped continuous Xulane 4 months ago.  No menses or BTB since then. No hot flushes/night sweats.  Planning to consult for Chinese herbal medicine.  No pelvic pain.  Abstinent.   Probably entering menopause.  Will verify [place] today. - FSH  3. Acquired hypothyroidism Managed by Dr Orlan Leavens.  Recent increase in Synthroid because her TSH was elevated at 6.06 in 06/2022.  Will check TSH today. - TSH  Other orders - Olopatadine HCl (PATADAY OP); Apply to eye.   Princess Bruins MD, 11:45 AM

## 2022-09-28 LAB — FOLLICLE STIMULATING HORMONE: FSH: 55.2 m[IU]/mL

## 2022-09-28 LAB — TSH: TSH: 1.78 mIU/L

## 2022-10-16 ENCOUNTER — Ambulatory Visit (INDEPENDENT_AMBULATORY_CARE_PROVIDER_SITE_OTHER): Payer: HMO | Admitting: Psychology

## 2022-10-16 DIAGNOSIS — F4310 Post-traumatic stress disorder, unspecified: Secondary | ICD-10-CM

## 2022-10-16 DIAGNOSIS — F3341 Major depressive disorder, recurrent, in partial remission: Secondary | ICD-10-CM

## 2022-10-16 DIAGNOSIS — F411 Generalized anxiety disorder: Secondary | ICD-10-CM | POA: Diagnosis not present
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
## 2022-10-17 ENCOUNTER — Ambulatory Visit: Payer: Self-pay | Admitting: Psychology

## 2022-10-20 ENCOUNTER — Other Ambulatory Visit (HOSPITAL_COMMUNITY): Payer: Self-pay | Admitting: Psychiatry

## 2022-10-22 ENCOUNTER — Ambulatory Visit (INDEPENDENT_AMBULATORY_CARE_PROVIDER_SITE_OTHER): Payer: HMO | Admitting: Psychology

## 2022-10-22 DIAGNOSIS — F4310 Post-traumatic stress disorder, unspecified: Secondary | ICD-10-CM

## 2022-10-22 DIAGNOSIS — F411 Generalized anxiety disorder: Secondary | ICD-10-CM

## 2022-10-22 DIAGNOSIS — F3341 Major depressive disorder, recurrent, in partial remission: Secondary | ICD-10-CM

## 2022-10-22 NOTE — Progress Notes (Signed)
[facility] Counselor/Therapist Progress Note  Patient ID: [name], MRN: [id number]    Date: 10/22/22  Time Spent: 4:00  pm - 4:52 pm : 52 Minutes  Treatment Type: Individual Therapy.  Reported Symptoms: Depression and Anxiety.   Mental Status Exam: Appearance:  Casual     Behavior: Appropriate  Motor: Normal  Speech/Language:  Clear and Coherent  Affect: Appropriate  Mood: dysthymic  Thought process: normal  Thought content:   WNL  Sensory/Perceptual disturbances:   WNL  Orientation: oriented to person, place, time/date, and situation  Attention: Good  Concentration: Good  Memory: WNL  Fund of knowledge:  Good  Insight:   Good  Judgment:  Good  Impulse Control: Good   Risk Assessment: Danger to Self:  No Self-injurious Behavior: No Danger to Others: No Duty to Warn:no Physical Aggression / Violence:No  Access to Firearms a concern: No  Gang Involvement:No   Subjective:   [name] participated in the session, in person in the office with the therapist, and consented to treatment [name] reviewed the events of the past week.We reviewed numerous treatment approaches including CBT, BA, Problem Solving, and Solution focused therapy. Psych-education regarding the [name]'s diagnosis of Major depressive disorder, recurrent episode, in partial remission ([name])  GAD (generalized anxiety disorder)  Posttraumatic stress disorder was provided during the session. We discussed [name]'s goals treatment goals include increasing motivation and task, becoming more social and date, managing overall mood, processing past events, bolstering coping skills, managing overall symptoms, & increasing mindfulness.   [name] provided verbal approval of the treatment plan.    Interventions: Psycho-education & Goal Setting.   Diagnosis:   Major depressive disorder, recurrent episode, in partial remission (HCC)  GAD (generalized anxiety  disorder)  Posttraumatic stress disorder  Psychiatric Treatment: Yes , Dr. Nicole Cella.  See chart.   Treatment Plan:  Client Abilities/Strengths [name] is intelligent, well spoken, and motivated for change.   Support System: Friends from church.   Client Treatment Preferences OPT  Client Statement of Needs [name] would like to increase motivation and task, becoming more social and date, managing overall mood, processing past events, bolstering coping skills, managing overall symptoms, & increasing mindfulness.     Treatment Level Weekly  Symptoms  Depression:  Loss of interest, feeling down, poor sleep, lethargy, poor appetite, difficulty concentrating  (Status: maintained) Anxiety: feeling nervous, worrying about different things, trouble relaxing, irritability, feeling afraid as if something awful might happen, anxiety leaving the home.   (Status: maintained)  Goals:   [name] experiences symptoms of anxiety, depression, and PTSD.   Target Date: 10/23/23 Frequency: Weekly  Progress: 0 Modality: individual    Therapist will provide referrals for additional resources as appropriate.  Therapist will provide psycho-education regarding [name]'s diagnosis and corresponding treatment approaches and interventions. Licensed Clinical Social Worker, [place], [name] will support the patient's ability to achieve the goals identified. will employ CBT, BA, Problem-solving, Solution Focused, Mindfulness,  coping skills, & other evidenced-based practices will be used to promote progress towards healthy functioning to help manage decrease symptoms associated with her diagnosis.   Reduce overall level, frequency, and intensity of the feelings of depression & anxiety evidenced by decreased overall symptoms from 6 to 7 days/week to 0 to 1 days/week per client report for at least 3 consecutive months. Verbally express understanding of the relationship between feelings of depression, anxiety and their impact  on thinking patterns and behaviors. Verbalize an understanding of the role that  distorted thinking plays in creating fears, excessive worry, and ruminations.  [name] participated in the creation of the treatment plan)    Buena Irish, [name]

## 2022-10-29 ENCOUNTER — Ambulatory Visit: Payer: HMO | Admitting: Psychology

## 2022-11-02 ENCOUNTER — Ambulatory Visit (INDEPENDENT_AMBULATORY_CARE_PROVIDER_SITE_OTHER): Payer: PPO | Admitting: Internal Medicine

## 2022-11-02 ENCOUNTER — Encounter: Payer: Self-pay | Admitting: Internal Medicine

## 2022-11-02 VITALS — BP 120/82 | HR 65 | Ht 60.6 in | Wt 170.0 lb

## 2022-11-02 DIAGNOSIS — Z794 Long term (current) use of insulin: Secondary | ICD-10-CM | POA: Diagnosis not present

## 2022-11-02 DIAGNOSIS — E039 Hypothyroidism, unspecified: Secondary | ICD-10-CM

## 2022-11-02 DIAGNOSIS — E1165 Type 2 diabetes mellitus with hyperglycemia: Secondary | ICD-10-CM

## 2022-11-02 DIAGNOSIS — E782 Mixed hyperlipidemia: Secondary | ICD-10-CM | POA: Diagnosis not present

## 2022-11-02 LAB — POCT GLYCOSYLATED HEMOGLOBIN (HGB A1C): Hemoglobin A1C: 6.1 % — AB (ref 4.0–5.6)

## 2022-11-02 MED ORDER — LANTUS SOLOSTAR 100 UNIT/ML ~~LOC~~ SOPN: 20.0000 [IU] | PEN_INJECTOR | Freq: Every day | SUBCUTANEOUS | 3 refills | Status: DC

## 2022-11-02 NOTE — Patient Instructions (Addendum)
Please decrease: - Lantus 20 units in am  Continue: - Novolog 10-15 units before meals and 22-26 units before a high-carb meal   Please continue: - Levothyroxine 100 mg daily  Take the thyroid hormone every day, with water, at least 30 minutes before breakfast, separated by at least 4 hours from: - acid reflux medications - calcium - iron - multivitamins   Please return in 4 months.

## 2022-11-02 NOTE — Progress Notes (Signed)
Patient ID: ZEAH VANCLEAVE, female   DOB: 03/21/1966, 57 y.o.   MRN: [id number]  HPI: [name] is a 57 y.o.-year-old female, returning for f/u for DM2, dx 2013, insulin-dependent, uncontrolled, with complications (diabetic retinopathy) and also hypothyroidism. Last visit 4 months ago.  Interim history: No increased urination, blurry vision, chest pain. She has occasional nausea, which is is chronic - only if delays a meal. She is stressed - mother is sick - in hospital in [place].  DM2: Reviewed HbA1c levels: Lab Results  Component Value Date   HGBA1C 7.2 (A) 06/27/2022   HGBA1C 6.2 (A) 02/23/2022   HGBA1C 6.4 (A) 10/19/2021   Pt is on a regimen of: - Lantus 12 >> ... 20 >> 25 >> 20 >> 20 units at bedtime >> 24 units in am - NovoLog 15-20 units 15 minutes before each meal, 24-26 >> 22-25 for a high carb meal We stopped Cycloset in 06/2020. She did not start Welchol 2 tabs 2x a day - added 03/2017 - too expensive On Turmeric, alpha-lipoic acid + 660 mcg biotin per day, Garlicin. We stopped Glipizide when we started NovoLog.  She has various intolerances to different diabetic medicines: Metformin ER >> in the past, she had gastric discomfort and severe diarrhea (loss of bowel control) Januvia >> tried for 1-2 months >> left chest pain She was wondering if she could take Invokana, however she had multiple vaginal yeast infections. She Also had nocturia 3-4 x a night.  She had reactive hypoglycemia symptoms while on Quercetin (was taking it for allergies)  She stopped Rybelsus 7 mg >> stopped 2/2 N and D   She checks her sugars more than 4 times a day with her libre CGM:  Previously:   Previously:   Lowest: LO x2 >> 60s >> 47 >> 40s >> 100 >> 40. Highest: 455 >> .Marland KitchenMarland Kitchen 350 (mints) >> 200s >> >250 >> 200.  She has many food intolerances: - Gluten >> cannot eat bread or pastry.  - raw vegetables, beans, cruciferous vegetables, fruit >> diarrhea.  - No dairy but, cereals  -at  night -now off ice cream In the past, no she was taking up to 36 units of NovoLog for ice cream.  I strongly advised her to stop this in the past. She does not digest fat well after her cholecystectomy in 2011.   Meter: One Touch Mini  No CKD, last BUN/creatinine was:  Lab Results  Component Value Date   BUN 15 03/26/2022   CREATININE 0.71 03/26/2022  On irbesartan.  + HL: Lab Results  Component Value Date   CHOL 346 (H) 02/22/2022   HDL 130.70 02/22/2022   LDLCALC 191 (H) 02/22/2022   LDLDIRECT 205.0 01/24/2016   TRIG 121.0 02/22/2022   CHOLHDL 3 02/22/2022  Previous cholesterol levels were: 325/197/132/154.   She could not tolerate pravastatin or Livalo due to muscle aches.  She refused a referral to lipid clinic in the past for PCSK9 inhibitors.  On fluvastatin XL + CoQ10. I suggested Zetia but she refused due to the possibility of developing diarrhea on it.  - last dilated eye exam was in 06/2022: + DR. Dr. Lennox Grumbles.   - + Numbness and tingling in her legs.  She is on the R enantiomer of alpha-lipoic acid.  Last foot exam 10/19/2021.  She also has a history of PCOS- she has seen Dr. Barry Dienes at [facility] in the past - note from 08/26/2012: perimenopause, and at that time she was  on Yasmin, subsequently changed to [place]. She sees Dr. Vernice Jefferson with OB/GYN. She has PTSD, MDD, insomnia, anxiety, asthma, HTN, GERD, status post cholecystectomy, anemia, transaminitis. She also had increased uterine bleeding (on Necon). She sawan acupuncturist. She takes Nettle powder >> helped her with diarrhea, anemia, insomnia. 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 Also, Undecylenic acid and mastic gum, no carbs x 1-2 months.  She has IBS >> better on L- plantarum, S.bulardii - Brevibacillus Laterosporus 20 min before b'fast.   Hypothyroidism  -Diagnosed in 02/2018. We started levothyroxine 03/2018.  Pt is on levothyroxine 100 mcg daily, dose  increased at last visit, taken: - in am - fasting - at least 30 min from b'fast - + calcium in butyrate later in the day - no iron - no multivitamins - no PPIs - not on Biotin On Kelp - 1 capsule a day.  Reviewed her TFTs: Lab Results  Component Value Date   TSH 1.78 09/27/2022   TSH 6.06 (H) 06/27/2022   TSH 8.33 (H) 02/22/2022   TSH 2.02 02/06/2021   TSH 2.82 07/01/2020   TSH 6.44 (H) 03/22/2020   TSH 2.96 04/01/2019   TSH 1.26 07/30/2018   TSH 10.64 (H) 04/04/2018   TSH 4.80 (H) 03/13/2018   FREET4 0.88 06/27/2022   FREET4 0.91 02/22/2022   FREET4 0.63 02/06/2021   FREET4 0.78 07/01/2020   FREET4 0.91 03/22/2020   FREET4 0.60 04/01/2019   FREET4 0.84 07/30/2018   FREET4 0.72 04/04/2018   T3FREE 2.6 04/04/2018   Her antithyroid antibodies were not elevated: Component     Latest Ref Rng & Units 04/04/2018  Thyroglobulin Ab     < or = 1 IU/mL <1  Thyroperoxidase Ab SerPl-aCnc     <9 IU/mL 2   No FH of thyroid cancer. No h/o radiation tx to head or neck. No Biotin use. No recent steroids use.   Pt denies: - feeling nodules in neck - hoarseness - dysphagia - choking  She takes vitamin D 5000 units.  ROS: + See HPI  I reviewed pt's medications, allergies, PMH, social hx, family hx, and changes were documented in the history of present illness. Otherwise, unchanged from my initial visit note.  Past Medical History:  Diagnosis Date   Abnormal Papanicolaou smear of cervix with positive human papilloma virus (HPV) test 02/2017   Anxiety    Asthma    Depression    Diabetes mellitus type II    Food intolerance    Headache    High blood pressure    IBS (irritable bowel syndrome)    Insomnia    Migraines    PCOS (polycystic ovarian syndrome)    PTSD (post-traumatic stress disorder)    Seasonal allergies    Thyroid disease    Past Surgical History:  Procedure Laterality Date   CHOLECYSTECTOMY     GALLBLADDER SURGERY     Social History    Socioeconomic History   Marital status: Single    Spouse name: Not on file   Number of children: 0   Years of education: 58   Highest education level: Master's degree (e.g., MA, MS, MEng, MEd, MSW, MBA)  Occupational History   Occupation: Long Term Disability    Comment: PTSD  Tobacco Use   Smoking status: Never   Smokeless tobacco: Never  Vaping Use   Vaping Use: Never used  Substance and Sexual Activity   Alcohol use: Not Currently  Drug use: No   Sexual activity: Not Currently    Partners: Male    Comment: 1st intercourse- 55, partners more than 5  Other Topics Concern   Not on file  Social History Narrative   Regular exercise: yes, walk   Caffeine use: occasionally tea   Fun: Social group events, walking her dogs, movies   Denies abuse and feels safe where she lives.    Social Determinants of Health   Financial Resource Strain: Low Risk  (01/08/2022)   Overall Financial Resource Strain (CARDIA)    Difficulty of Paying Living Expenses: Not hard at all  Food Insecurity: No Food Insecurity (01/08/2022)   Hunger Vital Sign    Worried About Running Out of Food in the Last Year: Never true    Ran Out of Food in the Last Year: Never true  Transportation Needs: No Transportation Needs (01/08/2022)   PRAPARE - Hydrologist (Medical): No    Lack of Transportation (Non-Medical): No  Physical Activity: Inactive (01/08/2022)   Exercise Vital Sign    Days of Exercise per Week: 0 days    Minutes of Exercise per Session: 0 min  Stress: No Stress Concern Present (01/08/2022)   [place]    Feeling of Stress : Not at all  Social Connections: Socially Isolated (01/08/2022)   Social Connection and Isolation Panel [NHANES]    Frequency of Communication with Friends and Family: Twice a week    Frequency of Social Gatherings with Friends and Family: More than three times a week    Attends  Religious Services: Never    Marine scientist or Organizations: No    Attends Archivist Meetings: Never    Marital Status: Never married  Intimate Partner Violence: Not At Risk (01/08/2022)   Humiliation, Afraid, Rape, and Kick questionnaire    Fear of Current or Ex-Partner: No    Emotionally Abused: No    Physically Abused: No    Sexually Abused: No   Current Outpatient Medications on File Prior to Visit  Medication Sig Dispense Refill   albuterol (VENTOLIN HFA) 108 (90 Base) MCG/ACT inhaler INHALE 2 PUFFS BY MOUTH EVERY 4 TO 6 HOURS AS DIRECTED(RESCUE) 42.5 g 0   Alpha-Lipoic Acid 600 MG CAPS Take 300 mg by mouth 2 (two) times daily.      atenolol (TENORMIN) 100 MG tablet TAKE 1 TABLET(100 MG) BY MOUTH TWICE DAILY 180 tablet 3   BD PEN NEEDLE NANO 2ND GEN 32G X 4 MM MISC USE 4 TIMES DAILY 400 each 3   Blood Glucose Monitoring Suppl (ONE TOUCH ULTRA MINI) w/Device KIT Use to check blood sugar 2 times a day. 1 kit 0   Blood Glucose Monitoring Suppl (ONETOUCH VERIO) w/Device KIT Use as instructed to check blood sugar 4X daily 1 kit 0   clobetasol cream (TEMOVATE) [id number] % Apply 1 application topically 2 (two) times daily. 30 g 1   clonazePAM (KLONOPIN) 0.5 MG tablet TAKE 1/2 TABLET(0.25 MG) BY MOUTH DAILY 15 tablet 0   Continuous Blood Gluc Sensor (FREESTYLE LIBRE 14 DAY SENSOR) MISC Use for continuous glucose monitoring. 6 each 3   Digestive Enzymes (ENZYME DIGEST PO) Take by mouth.     fexofenadine (ALLEGRA) 180 MG tablet Take 180 mg by mouth daily.     fluvastatin XL (LESCOL XL) 80 MG 24 hr tablet TAKE 1 TABLET(80 MG) BY MOUTH DAILY 90 tablet 3  glucose blood (ONETOUCH ULTRA) test strip Use to check blood sugar once a day. 100 strip 11   glucose blood (ONETOUCH VERIO) test strip Use as instructed to check blood sugar 4X daily 300 each 2   Insulin Aspart FlexPen (NOVOLOG) 100 UNIT/ML ADMINISTER 10 TO 30 UNITS UNDER THE SKIN THREE TIMES DAILY BEFORE MEALS 90 mL 1    irbesartan (AVAPRO) 75 MG tablet TAKE 1 TABLET(75 MG) BY MOUTH DAILY 90 tablet 3   LANTUS SOLOSTAR 100 UNIT/ML Solostar Pen Inject 24 Units into the skin daily. 30 mL 0   levothyroxine (SYNTHROID) 100 MCG tablet TAKE 1 TABLET(100 MCG) BY MOUTH DAILY 90 tablet 3   methocarbamol (ROBAXIN) 500 MG tablet TAKE 1 TABLET(500 MG) BY MOUTH EVERY 8 HOURS AS NEEDED FOR MUSCLE SPASMS 60 tablet 1   NOVOLOG FLEXPEN 100 UNIT/ML FlexPen Inject 10-30 Units into the skin 3 (three) times daily with meals. 90 mL 0   Olopatadine HCl (PATADAY OP) Apply to eye.     OVER THE COUNTER MEDICATION 1 capsule daily. Probiotic renew life      sertraline (ZOLOFT) 100 MG tablet TAKE 2 TABLETS BY MOUTH EVERY DAY 180 tablet 0   [DISCONTINUED] EFFEXOR XR 150 MG 24 hr capsule 375 mg daily.      No current facility-administered medications on file prior to visit.   Allergies  Allergen Reactions   Latex Itching and Swelling   Fluticasone     Nose bleeds   Metformin And Related     GI side effects   Pravastatin     myalgias   Family History  Problem Relation Age of Onset   Bipolar disorder Mother    Hypertension Mother    Diabetes Mother    Alzheimer's disease Father    Hypertension Father    Alzheimer's disease Maternal Grandmother    Heart disease Paternal Grandfather    PE: BP 120/82 (BP Location: Left Arm, Patient Position: Sitting, Cuff Size: Normal)   Pulse 65   Ht 5' 0.6" (1.539 m)   Wt 170 lb (77.1 kg)   SpO2 95%   BMI 32.55 kg/m    Wt Readings from Last 3 Encounters:  11/02/22 170 lb (77.1 kg)  08/17/22 171 lb 9.6 oz (77.8 kg)  06/27/22 171 lb (77.6 kg)   Constitutional: overweight, in NAD Eyes:  EOMI, no exophthalmos ENT: no neck masses, no cervical lymphadenopathy Cardiovascular: RRR, No MRG Respiratory: CTA B Musculoskeletal: no deformities Skin:no rashes Neurological: no tremor with outstretched hands Diabetic Foot Exam - Simple   Simple Foot Form Diabetic Foot exam was performed with  the following findings: Yes 11/02/2022 11:05 AM  Visual Inspection No deformities, no ulcerations, no other skin breakdown bilaterally: Yes Sensation Testing Intact to touch and monofilament testing bilaterally: Yes Pulse Check Posterior Tibialis and Dorsalis pulse intact bilaterally: Yes Comments Dry skin, medial hallux calluses    ASSESSMENT: 1. DM2, insulin-dependent, fairly well controlled, with complications - DR  2. HL - Very high LDL - high HDL - slightly high TG  3.  Hypothyroidism  PLAN:  1. DM2 - Patient with insulin-dependent type 2 diabetes, with suboptimal control, mostly due to many medication intolerances and binge eating disorder.  She is currently on a basal/bolus insulin regimen.  At last visit, we increased her Lantus dose after an HbA1c returned elevated (7.2%) and since her sugars were fluctuating around the upper limit of the target range. CGM interpretation: -At today's visit, we reviewed her CGM downloads: It appears that  85% of values are in target range (goal >70%), while 13% are higher than 180 (goal <25%), and 2% are lower than 70 (goal <4%).  The calculated average blood sugar is 137.  The projected HbA1c for the next 3 months (GMI) is 6.6%. -Reviewing the CGM trends, sugars appear to be fluctuating mostly within the target range with occasional hyperglycemic spikes after high carb meals, but these are not very frequent.  However, there is increased variability of blood sugars after dinner especially around midnight when sugars may be as low as 40.  We discussed about the possible need to back off the mealtime insulin and she agrees to try this.  To reduce the risk of hypoglycemia, I also advised her to back off the Lantus dose. - I advised her to:  Patient Instructions  Please decrease: - Lantus 20 units in am  Continue: - Novolog 10-15 units before meals and 22-26 units before a high-carb meal   Please continue: - Levothyroxine 100 mg daily  Take  the thyroid hormone every day, with water, at least 30 minutes before breakfast, separated by at least 4 hours from: - acid reflux medications - calcium - iron - multivitamins   Please return in 4 months.  - we checked her HbA1c: 6.1% (lower) - advised to check sugars at different times of the day - 4x a day, rotating check times - advised for yearly eye exams >> she is UTD - return to clinic in 4 months  2. HL -The latest lipid panel from 02/2022 showed a very high LDL: Lab Results  Component Value Date   CHOL 346 (H) 02/22/2022   HDL 130.70 02/22/2022   LDLCALC 191 (H) 02/22/2022   LDLDIRECT 205.0 01/24/2016   TRIG 121.0 02/22/2022   CHOLHDL 3 02/22/2022  -She could not tolerate Livalo.  I also suggested Zetia but she declined that she read that this could cause diarrhea. -She declined a referral to the lipid clinic for PCSK9 inhibitors. -On fluvastatin XL, taken inconsistently.  I strongly advised her to take it every day at last visit -She continues to eat ice cream and other sweets  3.   Hypothyroidism - latest thyroid labs reviewed with pt. >> normal: Lab Results  Component Value Date   TSH 1.78 09/27/2022  - she continues on LT4 100 mcg daily, increased at last visitdose  - pt feels good on this dose. - we discussed about taking the thyroid hormone every day, with water, >30 minutes before breakfast, separated by >4 hours from acid reflux medications, calcium, iron, multivitamins. Pt. is taking it correctly.  Philemon Kingdom, MD PhD [facility]

## 2022-11-07 ENCOUNTER — Ambulatory Visit (INDEPENDENT_AMBULATORY_CARE_PROVIDER_SITE_OTHER): Payer: PPO | Admitting: Psychology

## 2022-11-07 DIAGNOSIS — F3341 Major depressive disorder, recurrent, in partial remission: Secondary | ICD-10-CM | POA: Diagnosis not present

## 2022-11-07 DIAGNOSIS — F411 Generalized anxiety disorder: Secondary | ICD-10-CM

## 2022-11-07 DIAGNOSIS — F4310 Post-traumatic stress disorder, unspecified: Secondary | ICD-10-CM

## 2022-11-07 NOTE — Progress Notes (Signed)
[facility] Counselor/Therapist Progress Note  Patient ID: [name], MRN: [id number]    Date: 11/07/22  Time Spent: 12:31 pm - 1:22 pm : 51 Minutes  Treatment Type: Individual Therapy.  Reported Symptoms: Depression and Anxiety.   Mental Status Exam: Appearance:  NA     Behavior: Appropriate  Motor: NA  Speech/Language:  Clear and Coherent  Affect: Appropriate  Mood: dysthymic  Thought process: normal  Thought content:   WNL  Sensory/Perceptual disturbances:   WNL  Orientation: oriented to person, place, time/date, and situation  Attention: Good  Concentration: Good  Memory: WNL  Fund of knowledge:  Good  Insight:   Good  Judgment:  Good  Impulse Control: Good   Risk Assessment: Danger to Self:  No Self-injurious Behavior: No Danger to Others: No Duty to Warn:no Physical Aggression / Violence:No  Access to Firearms a concern: No  Gang Involvement:No   Subjective:   [name] participated in the session, from her home, via phone. Therapist participated from home office. We met online due to [place] pandemic. She noted her mother's declining health and noted this being a difficult request. She noted feeling guilt  due to not being able to visit due to needing to care for self and having numerous health issues and dietary limitations. She noted previously visited her family while being accompanied by her ex-boyfriend, who passed recently. We explored this during the session. She noted dealing with seasonal allergies and discussed this greatly affecting her day-to-day functioning. She noted managing her self-talk  in relation to not being able to visit and noted being mindful of her own limitations to visit. She noted identifying alternative ways to provide support including frequent phone check-ins, providing support via shopping for them, and staying in contact with her mother's nurse. She noted worry about her father's functioning once her mother passes  and noted a lack of support in the area. We reviewed coping skills and how she spends her time as she works on supporting her parents. Therapist validated [name]'s feelings and experience and provided supportive therapy.    Interventions: CBT and interpersonal.    Diagnosis:   Major depressive disorder, recurrent episode, in partial remission (HCC)  GAD (generalized anxiety disorder)  Posttraumatic stress disorder  Psychiatric Treatment: Yes , Dr. Nicole Cella.  See chart.   Treatment Plan:  Client Abilities/Strengths [name] is intelligent, well spoken, and motivated for change.   Support System: Friends from church.   Client Treatment Preferences OPT  Client Statement of Needs [name] would like to increase motivation and task, becoming more social and date, managing overall mood, processing past events, bolstering coping skills, managing overall symptoms, & increasing mindfulness.     Treatment Level Weekly  Symptoms  Depression:  Loss of interest, feeling down, poor sleep, lethargy, poor appetite, difficulty concentrating  (Status: maintained) Anxiety: feeling nervous, worrying about different things, trouble relaxing, irritability, feeling afraid as if something awful might happen, anxiety leaving the home.   (Status: maintained)  Goals:   [name] experiences symptoms of anxiety, depression, and PTSD.   Target Date: 10/23/23 Frequency: Weekly  Progress: 0 Modality: individual    Therapist will provide referrals for additional resources as appropriate.  Therapist will provide psycho-education regarding [name]'s diagnosis and corresponding treatment approaches and interventions. Licensed Clinical Social Worker, [place], [name] will support the patient's ability to achieve the goals identified. will employ CBT, BA, Problem-solving, Solution Focused, Mindfulness,  coping skills, & other evidenced-based practices will be used to  promote progress towards healthy functioning to help manage  decrease symptoms associated with her diagnosis.   Reduce overall level, frequency, and intensity of the feelings of depression & anxiety evidenced by decreased overall symptoms from 6 to 7 days/week to 0 to 1 days/week per client report for at least 3 consecutive months. Verbally express understanding of the relationship between feelings of depression, anxiety and their impact on thinking patterns and behaviors. Verbalize an understanding of the role that distorted thinking plays in creating fears, excessive worry, and ruminations.  [name] participated in the creation of the treatment plan)    Buena Irish, [name]

## 2022-11-12 ENCOUNTER — Ambulatory Visit (INDEPENDENT_AMBULATORY_CARE_PROVIDER_SITE_OTHER): Payer: PPO | Admitting: Psychology

## 2022-11-12 DIAGNOSIS — F411 Generalized anxiety disorder: Secondary | ICD-10-CM

## 2022-11-12 DIAGNOSIS — F3341 Major depressive disorder, recurrent, in partial remission: Secondary | ICD-10-CM | POA: Diagnosis not present

## 2022-11-12 DIAGNOSIS — F4310 Post-traumatic stress disorder, unspecified: Secondary | ICD-10-CM

## 2022-11-12 NOTE — Progress Notes (Signed)
[place] Counselor/Therapist Progress Note  Patient ID: [name], MRN: [id number]    Date: 11/12/22  Time Spent: 12:35 pm - 1:25 pm :50 Minutes  Treatment Type: Individual Therapy.  Reported Symptoms: Depression and Anxiety.   Mental Status Exam: Appearance:  NA     Behavior: Appropriate  Motor: NA  Speech/Language:  Clear and Coherent  Affect: Appropriate  Mood: dysthymic  Thought process: normal  Thought content:   WNL  Sensory/Perceptual disturbances:   WNL  Orientation: oriented to person, place, time/date, and situation  Attention: Good  Concentration: Good  Memory: WNL  Fund of knowledge:  Good  Insight:   Good  Judgment:  Good  Impulse Control: Good   Risk Assessment: Danger to Self:  No Self-injurious Behavior: No Danger to Others: No Duty to Warn:no Physical Aggression / Violence:No  Access to Firearms a concern: No  Gang Involvement:No   Subjective:   [name] participated in the session, from her home, via phone. Therapist participated from home office. We met online due to [place] pandemic. [name] noted the events of the past week including her mother's recent move to a different hospital. She noted improvement in her own health including a lower A1C due to her consistency with her insulin. She noted working actively on her gut health and noticed improvement. She noted need for structure in her life and noted the lack of structure on her mood. We worked on identifying what this structure would look like. She noted the inclusion of exercise, being a part of a community, engaging in a hobby such as jewelry making, line dancing, volunteering, & swimming. [name] highlighted that having structure often "freaks her out", anxiety about being late, discomfort of leaving home, a sense of vulnerability (related to grief [name]), feeling discomfort in public, and discomfort in specific settings /situations (bureaucracy).  Therapist provided  psycho-education regarding the importance of creating a routine and the positive effects of this on mood, energy, and outlook. Therapist provided [name] with a Veterinary surgeon handout, via email, for reference and review. Therapist validated and normalized [name]'s feelings and experience and encouraged additional work on this area and reading the aforementioned handout. Therapist provided supportive therapy.     Interventions: BA  Diagnosis:    Major depressive disorder, recurrent episode, in partial remission (HCC)  GAD (generalized anxiety disorder)  Posttraumatic stress disorder  Psychiatric Treatment: Yes , Dr. Nicole Cella.  See chart.   Treatment Plan:  Client Abilities/Strengths [name] is intelligent, well spoken, and motivated for change.   Support System: Friends from church.   Client Treatment Preferences OPT  Client Statement of Needs [name] would like to increase motivation and task, becoming more social and date, managing overall mood, processing past events, bolstering coping skills, managing overall symptoms, & increasing mindfulness.     Treatment Level Weekly  Symptoms  Depression:  Loss of interest, feeling down, poor sleep, lethargy, poor appetite, difficulty concentrating  (Status: maintained) Anxiety: feeling nervous, worrying about different things, trouble relaxing, irritability, feeling afraid as if something awful might happen, anxiety leaving the home.   (Status: maintained)  Goals:   [name] experiences symptoms of anxiety, depression, and PTSD.   Target Date: 10/23/23 Frequency: Weekly  Progress: 0 Modality: individual    Therapist will provide referrals for additional resources as appropriate.  Therapist will provide psycho-education regarding [name]'s diagnosis and corresponding treatment approaches and interventions. Licensed Clinical Social Worker, [place], [name] will support the patient's ability to achieve the goals identified.  will employ CBT,  BA, Problem-solving, Solution Focused, Mindfulness,  coping skills, & other evidenced-based practices will be used to promote progress towards healthy functioning to help manage decrease symptoms associated with her diagnosis.   Reduce overall level, frequency, and intensity of the feelings of depression & anxiety evidenced by decreased overall symptoms from 6 to 7 days/week to 0 to 1 days/week per client report for at least 3 consecutive months. Verbally express understanding of the relationship between feelings of depression, anxiety and their impact on thinking patterns and behaviors. Verbalize an understanding of the role that distorted thinking plays in creating fears, excessive worry, and ruminations.  [name] participated in the creation of the treatment plan)    Abigail Irish, [name]

## 2022-11-13 ENCOUNTER — Telehealth (HOSPITAL_COMMUNITY): Payer: Self-pay | Admitting: Psychiatry

## 2022-11-13 DIAGNOSIS — F3341 Major depressive disorder, recurrent, in partial remission: Secondary | ICD-10-CM

## 2022-11-13 MED ORDER — SERTRALINE HCL 100 MG PO TABS
ORAL_TABLET | ORAL | 0 refills | Status: DC
Start: 2022-11-13 — End: 2023-02-21

## 2022-11-13 NOTE — Telephone Encounter (Signed)
Medication management - Message left for patient that Dr. De Nurse had sent in her requested new Sertraline order to her [facility] on [address] and patient to call back if any issues.

## 2022-11-13 NOTE — Telephone Encounter (Signed)
Patient called requesting refill of: sertraline (ZOLOFT) 100 MG tablet   [facility] [phone] - [place], [place] - [address] AT [place] Phone: [phone]  Fax: [phone]     Last ordered: 08/13/2022 - 180 tablets  Last visit: 05/10/2022  Next visit: 11/14/2022  Patient states she has only 1/2 pill remaining and the pharmacy is relocating tomorrow if order can be placed today.

## 2022-11-14 ENCOUNTER — Telehealth (HOSPITAL_COMMUNITY): Payer: PPO | Admitting: Psychiatry

## 2022-11-20 ENCOUNTER — Ambulatory Visit (INDEPENDENT_AMBULATORY_CARE_PROVIDER_SITE_OTHER): Payer: PPO | Admitting: Psychology

## 2022-11-20 DIAGNOSIS — F3341 Major depressive disorder, recurrent, in partial remission: Secondary | ICD-10-CM | POA: Diagnosis not present

## 2022-11-20 DIAGNOSIS — F4310 Post-traumatic stress disorder, unspecified: Secondary | ICD-10-CM

## 2022-11-20 DIAGNOSIS — F411 Generalized anxiety disorder: Secondary | ICD-10-CM | POA: Diagnosis not present
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
## 2022-11-26 ENCOUNTER — Ambulatory Visit (INDEPENDENT_AMBULATORY_CARE_PROVIDER_SITE_OTHER): Payer: PPO | Admitting: Psychology

## 2022-11-26 DIAGNOSIS — F411 Generalized anxiety disorder: Secondary | ICD-10-CM | POA: Diagnosis not present

## 2022-11-26 DIAGNOSIS — F4310 Post-traumatic stress disorder, unspecified: Secondary | ICD-10-CM | POA: Diagnosis not present

## 2022-11-26 DIAGNOSIS — F3341 Major depressive disorder, recurrent, in partial remission: Secondary | ICD-10-CM

## 2022-11-26 NOTE — Progress Notes (Signed)
[place] Counselor/Therapist Progress Note  Patient ID: [name], MRN: [id number]    Date: 11/26/22  Time Spent: 2:30 pm - 3:28 pm : 58 Minutes  Treatment Type: Individual Therapy.  Reported Symptoms: Depression and Anxiety.   Mental Status Exam: Appearance:  Casual     Behavior: Appropriate  Motor: Normal  Speech/Language:  Clear and Coherent  Affect: Appropriate  Mood: dysthymic  Thought process: normal  Thought content:   WNL  Sensory/Perceptual disturbances:   WNL  Orientation: oriented to person, place, time/date, and situation  Attention: Good  Concentration: Good  Memory: WNL  Fund of knowledge:  Good  Insight:   Good  Judgment:  Good  Impulse Control: Good   Risk Assessment: Danger to Self:  No Self-injurious Behavior: No Danger to Others: No Duty to Warn:no Physical Aggression / Violence:No  Access to Firearms a concern: No  Gang Involvement:No   Subjective:   [name] participated in the session from  the office with the therapist. [name] noted her mother's health decline and concern about her father and how he will cope once she passes. She noted her father's intent to move to [place] and feeling sadness as a result. She noted a need to build more local support in the area. She noted reviewing the [facility] handout but needs additional time to process. She noted getting out of the home, getting her hair done, and noted the regrowth of hair after a previous COVID diagnosis. She noted a need to get to her exercise classes but has not been able to get to one yet. We explored this during the session. She noted being up all night and sleeping more into the day and noted feeling more rested. We worked on identifying ways to break down this tasks to more manageable parts including driving by the gym to get familiarity regarding the drive, possibly renew her pass, and attend a class. Therapist praised [name] and encouraged work in this area. Therapist  validated and normalized [name]'s feelings, vulnerability, and consistent engagement during the session. Therapist provided supportive therapy.    Interventions: CBT,   Diagnosis:    Major depressive disorder, recurrent episode, in partial remission (HCC)  GAD (generalized anxiety disorder)  Posttraumatic stress disorder  Psychiatric Treatment: Yes , Dr. Nicole Cella.  See chart.   Treatment Plan:  Client Abilities/Strengths [name] is intelligent, well spoken, and motivated for change.   Support System: Friends from church.   Client Treatment Preferences OPT  Client Statement of Needs [name] would like to increase motivation and task, becoming more social and date, managing overall mood, processing past events, bolstering coping skills, managing overall symptoms, & increasing mindfulness.     Treatment Level Weekly  Symptoms  Depression:  Loss of interest, feeling down, poor sleep, lethargy, poor appetite, difficulty concentrating  (Status: maintained) Anxiety: feeling nervous, worrying about different things, trouble relaxing, irritability, feeling afraid as if something awful might happen, anxiety leaving the home.   (Status: maintained)  Goals:   [name] experiences symptoms of anxiety, depression, and PTSD.   Target Date: 10/23/23 Frequency: Weekly  Progress: 0 Modality: individual    Therapist will provide referrals for additional resources as appropriate.  Therapist will provide psycho-education regarding [name]'s diagnosis and corresponding treatment approaches and interventions. Licensed Clinical Social Worker, [place], [name] will support the patient's ability to achieve the goals identified. will employ CBT, BA, Problem-solving, Solution Focused, Mindfulness,  coping skills, & other evidenced-based practices will be used to promote progress  towards healthy functioning to help manage decrease symptoms associated with her diagnosis.   Reduce overall level, frequency, and  intensity of the feelings of depression & anxiety evidenced by decreased overall symptoms from 6 to 7 days/week to 0 to 1 days/week per client report for at least 3 consecutive months. Verbally express understanding of the relationship between feelings of depression, anxiety and their impact on thinking patterns and behaviors. Verbalize an understanding of the role that distorted thinking plays in creating fears, excessive worry, and ruminations.  [name] participated in the creation of the treatment plan)    Buena Irish, [name]

## 2022-12-04 ENCOUNTER — Ambulatory Visit: Payer: PPO | Admitting: Psychology

## 2022-12-10 ENCOUNTER — Ambulatory Visit: Payer: PPO | Admitting: Psychology

## 2022-12-13 ENCOUNTER — Ambulatory Visit (HOSPITAL_COMMUNITY): Payer: PPO | Admitting: Psychiatry

## 2022-12-25 ENCOUNTER — Telehealth (HOSPITAL_COMMUNITY): Payer: Self-pay | Admitting: Psychiatry

## 2022-12-25 NOTE — Telephone Encounter (Signed)
Patient called requesting refill of: clonazePAM (KLONOPIN) 0.5 MG tablet stating pharmacy had sent over refill request but had not received any new orders.  [id number] - [place], [place] - [address] (Ph: [phone])   Last ordered: 10/22/2022 - 15 tablets  Last visit: 05/10/2022 or 11/14/2022  Next visit: 12/27/2022

## 2022-12-26 NOTE — Telephone Encounter (Signed)
Medication refill request - [facility] requesting a new Clonazepam 0.5 mg order, last provided and filled on 10/22/22. Patient last evaluated on 11/14/22 and returns next on 12/27/22.

## 2022-12-27 ENCOUNTER — Ambulatory Visit (INDEPENDENT_AMBULATORY_CARE_PROVIDER_SITE_OTHER): Payer: PPO | Admitting: Psychiatry

## 2022-12-27 ENCOUNTER — Encounter (HOSPITAL_COMMUNITY): Payer: Self-pay | Admitting: Psychiatry

## 2022-12-27 VITALS — BP 125/75 | HR 58 | Temp 98.3°F | Ht 61.0 in | Wt 169.0 lb

## 2022-12-27 DIAGNOSIS — F3341 Major depressive disorder, recurrent, in partial remission: Secondary | ICD-10-CM | POA: Diagnosis not present

## 2022-12-27 DIAGNOSIS — F4310 Post-traumatic stress disorder, unspecified: Secondary | ICD-10-CM

## 2022-12-27 DIAGNOSIS — F411 Generalized anxiety disorder: Secondary | ICD-10-CM

## 2022-12-27 MED ORDER — CLONAZEPAM 0.5 MG PO TABS: ORAL_TABLET | ORAL | 1 refills | Status: DC
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
## 2022-12-31 ENCOUNTER — Telehealth: Payer: Self-pay | Admitting: Family

## 2022-12-31 NOTE — Telephone Encounter (Signed)
Copied from CRM [id number]. Topic: Medicare AWV >> Dec 31, 2022 10:32 AM Payton Doughty wrote: Reason for CRM: Called patient to schedule Medicare Annual Wellness Visit (AWV). Left message for patient to call back and schedule Medicare Annual Wellness Visit (AWV).  Last date of AWV: 01/08/22  Please schedule an appointment at any time with Donne Anon, CMA  .  If any questions, please contact me.  Thank you ,  Verlee Rossetti; Care Guide Ambulatory Clinical Support [facility] Direct Dial: [phone]

## 2023-01-01 ENCOUNTER — Ambulatory Visit (INDEPENDENT_AMBULATORY_CARE_PROVIDER_SITE_OTHER): Payer: PPO | Admitting: Psychology

## 2023-01-01 DIAGNOSIS — F3341 Major depressive disorder, recurrent, in partial remission: Secondary | ICD-10-CM | POA: Diagnosis not present

## 2023-01-01 DIAGNOSIS — F4310 Post-traumatic stress disorder, unspecified: Secondary | ICD-10-CM | POA: Diagnosis not present

## 2023-01-01 DIAGNOSIS — F411 Generalized anxiety disorder: Secondary | ICD-10-CM | POA: Diagnosis not present

## 2023-01-01 NOTE — Progress Notes (Signed)
[facility] Counselor/Therapist Progress Note  Patient ID: [name], MRN: [id number]    Date: 01/01/23  Time Spent: 2:01 pm - 2:44 pm : 43 Minutes  Treatment Type: Individual Therapy.  Reported Symptoms: Depression and Anxiety.   Mental Status Exam: Appearance:  Casual     Behavior: Appropriate  Motor: Normal  Speech/Language:  Clear and Coherent  Affect: Appropriate  Mood: dysthymic  Thought process: normal  Thought content:   WNL  Sensory/Perceptual disturbances:   WNL  Orientation: oriented to person, place, time/date, and situation  Attention: Good  Concentration: Good  Memory: WNL  Fund of knowledge:  Good  Insight:   Good  Judgment:  Good  Impulse Control: Good   Risk Assessment: Danger to Self:  No Self-injurious Behavior: No Danger to Others: No Duty to Warn:no Physical Aggression / Violence:No  Access to Firearms a concern: No  Gang Involvement:No   Subjective:   [name] participated in the session from  the office with the therapist. She noted a recent visit with her family [place] to help her father manage her mother's belongings. She noted getting to see her brother and the experience going well, overall. She noted difficulty reconciling her mother's treatment of others, which was positive, vs. The wonderful version of her when treating others. She noted her mother's behavior of apologizing before her decline as "cowardly" and that this "robbed [place]" of having positive memories. She noted her mother seeing her as a "whipping boy, a threat, who bullied me". She noted wanting to be "mothered"  by her mother. She noted her maternal grandmother being a great support to her [place]), as well as her father. She noted her mother's stressful relationship with her own mother, Domino's grandmother, and noted a lack of positive experiences. She noted her mother generally avoiding her own mother, when possible, due to the stress. She noted being hopeful  that she and her brother can reconcile over time. She noted some improvement in her sleep but "not during conventional hours". Therapist validated and normalized Tichina's feelings, vulnerability, and consistent engagement during the session. Therapist provided supportive therapy.    Interventions: Interpersonal and Grief.   Diagnosis:    Major depressive disorder, recurrent episode, in partial remission  GAD (generalized anxiety disorder)  Posttraumatic stress disorder  Psychiatric Treatment: Yes , Dr. Fredda Hammed.  See chart.   Treatment Plan:  Client Abilities/Strengths [name] is intelligent, well spoken, and motivated for change.   Support System: Friends from church.   Client Treatment Preferences OPT  Client Statement of Needs Alexsandria would like to increase motivation and task, becoming more social and date, managing overall mood, processing past events, bolstering coping skills, managing overall symptoms, & increasing mindfulness.     Treatment Level Weekly  Symptoms  Depression:  Loss of interest, feeling down, poor sleep, lethargy, poor appetite, difficulty concentrating  (Status: maintained) Anxiety: feeling nervous, worrying about different things, trouble relaxing, irritability, feeling afraid as if something awful might happen, anxiety leaving the home.   (Status: maintained)  Goals:   Jaide experiences symptoms of anxiety, depression, and PTSD.   Target Date: 10/23/23 Frequency: Weekly  Progress: 0 Modality: individual    Therapist will provide referrals for additional resources as appropriate.  Therapist will provide psycho-education regarding Jaynie's diagnosis and corresponding treatment approaches and interventions. Licensed Clinical Social Worker, [place], [name] will support the patient's ability to achieve the goals identified. will employ CBT, BA, Problem-solving, Solution Focused, Mindfulness,  coping skills, &  other evidenced-based practices will be used to  promote progress towards healthy functioning to help manage decrease symptoms associated with her diagnosis.   Reduce overall level, frequency, and intensity of the feelings of depression & anxiety evidenced by decreased overall symptoms from 6 to 7 days/week to 0 to 1 days/week per client report for at least 3 consecutive months. Verbally express understanding of the relationship between feelings of depression, anxiety and their impact on thinking patterns and behaviors. Verbalize an understanding of the role that distorted thinking plays in creating fears, excessive worry, and ruminations.  [name] participated in the creation of the treatment plan)    [name], [name]

## 2023-01-07 ENCOUNTER — Ambulatory Visit (INDEPENDENT_AMBULATORY_CARE_PROVIDER_SITE_OTHER): Payer: PPO | Admitting: Psychology

## 2023-01-07 DIAGNOSIS — F411 Generalized anxiety disorder: Secondary | ICD-10-CM

## 2023-01-07 DIAGNOSIS — F4310 Post-traumatic stress disorder, unspecified: Secondary | ICD-10-CM | POA: Diagnosis not present

## 2023-01-07 DIAGNOSIS — F3341 Major depressive disorder, recurrent, in partial remission: Secondary | ICD-10-CM | POA: Diagnosis not present

## 2023-01-07 NOTE — Progress Notes (Unsigned)
[facility] Counselor/Therapist Progress Note  Patient ID: [name], MRN: [id number]    Date: 01/07/23  Time Spent: 10:02 pm - 10:28 pm : 26 Minutes  Treatment Type: Individual Therapy.  Reported Symptoms: Depression and Anxiety.   Mental Status Exam: Appearance:  Casual     Behavior: Appropriate  Motor: Normal  Speech/Language:  Clear and Coherent  Affect: Appropriate  Mood: dysthymic  Thought process: normal  Thought content:   WNL  Sensory/Perceptual disturbances:   WNL  Orientation: oriented to person, place, time/date, and situation  Attention: Good  Concentration: Good  Memory: WNL  Fund of knowledge:  Good  Insight:   Good  Judgment:  Good  Impulse Control: Good   Risk Assessment: Danger to Self:  No Self-injurious Behavior: No Danger to Others: No Duty to Warn:no Physical Aggression / Violence:No  Access to Firearms a concern: No  Gang Involvement:No   Subjective:   [name] participated in the session from  the office with the therapist. [name] noted being sleepy during the session and noted going to bed around 3 am. She noted not having heard back from her father after contacting him twice late last week. She noted her intent to attempt to contact him today.  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 She noted the stressors she experiences as a result. She noted being "mad" at her ex who passed 2 years ago. Therapist encouraged [name] to write a letter to her deceased roommate regarding her feelings and experience. She noted his cognitive decline a year prior to his passing. She noted being open to writing her mother a letter, as well. Therapist praised [name] for her efforts in the session and her willingness to engage in this exercise. Therapist validated and normalized [name]'s feelings, vulnerability, and consistent engagement during the session. Therapist provided supportive  therapy.    Interventions: Interpersonal and Grief.   Diagnosis:    Major depressive disorder, recurrent episode, in partial remission  GAD (generalized anxiety disorder)  Posttraumatic stress disorder  Psychiatric Treatment: Yes , Dr. Fredda Hammed.  See chart.   Treatment Plan:  Client Abilities/Strengths [name] is intelligent, well spoken, and motivated for change.   Support System: Friends from church.   Client Treatment Preferences OPT  Client Statement of Needs [name] would like to increase motivation and task, becoming more social and date, managing overall mood, processing past events, bolstering coping skills, managing overall symptoms, & increasing mindfulness.     Treatment Level Weekly  Symptoms  Depression:  Loss of interest, feeling down, poor sleep, lethargy, poor appetite, difficulty concentrating  (Status: maintained) Anxiety: feeling nervous, worrying about different things, trouble relaxing, irritability, feeling afraid as if something awful might happen, anxiety leaving the home.   (Status: maintained)  Goals:   [name] experiences symptoms of anxiety, depression, and PTSD.   Target Date: 10/23/23 Frequency: Weekly  Progress: 0 Modality: individual    Therapist will provide referrals for additional resources as appropriate.  Therapist will provide psycho-education regarding [name]'s diagnosis and corresponding treatment approaches and interventions. Licensed Clinical Social Worker, [place], [name] will support the patient's ability to achieve the goals identified. will employ CBT, BA, Problem-solving, Solution Focused, Mindfulness,  coping skills, & other evidenced-based practices will be used to promote progress towards healthy functioning to help manage decrease symptoms associated with her diagnosis.   Reduce overall level, frequency, and intensity of the feelings of depression &  anxiety evidenced by decreased overall symptoms from 6 to 7 days/week to 0 to 1  days/week per client report for at least 3 consecutive months. Verbally express understanding of the relationship between feelings of depression, anxiety and their impact on thinking patterns and behaviors. Verbalize an understanding of the role that distorted thinking plays in creating fears, excessive worry, and ruminations.  [name] participated in the creation of the treatment plan)    [name], [name]

## 2023-01-15 ENCOUNTER — Telehealth: Payer: Self-pay | Admitting: Internal Medicine

## 2023-01-15 DIAGNOSIS — E1165 Type 2 diabetes mellitus with hyperglycemia: Secondary | ICD-10-CM

## 2023-01-15 MED ORDER — LANTUS SOLOSTAR 100 UNIT/ML ~~LOC~~ SOPN: 20.0000 [IU] | PEN_INJECTOR | Freq: Every day | SUBCUTANEOUS | 3 refills | Status: DC

## 2023-01-15 NOTE — Telephone Encounter (Signed)
Rx sent to pharmacy   

## 2023-01-15 NOTE — Telephone Encounter (Signed)
MEDICATION: Lantus SoloStar LANTUS SOLOSTAR 100 UNIT/ML Solostar Pen  PHARMACY:  [id number] - [place], [place] - [address] (Ph: [phone])   HAS THE PATIENT CONTACTED THEIR PHARMACY?  Yes  IS THIS A 90 DAY SUPPLY : Yes  IS PATIENT OUT OF MEDICATION: No  IF NOT; HOW MUCH IS LEFT: 3/4 of a pen  LAST APPOINTMENT DATE: @2 /16/2024  NEXT APPOINTMENT DATE:@7 /10/2022  DO WE HAVE YOUR PERMISSION TO LEAVE A DETAILED MESSAGE?: Yes  OTHER COMMENTS:    **Let patient know to contact pharmacy at the end of the day to make sure medication is ready. **  ** Please notify patient to allow 48-72 hours to process**  **Encourage patient to contact the pharmacy for refills or they can request refills through [facility]**

## 2023-01-21 ENCOUNTER — Ambulatory Visit: Payer: PPO | Admitting: Psychology

## 2023-02-05 ENCOUNTER — Ambulatory Visit (INDEPENDENT_AMBULATORY_CARE_PROVIDER_SITE_OTHER): Payer: PPO | Admitting: Psychology

## 2023-02-05 DIAGNOSIS — F3341 Major depressive disorder, recurrent, in partial remission: Secondary | ICD-10-CM

## 2023-02-05 DIAGNOSIS — F411 Generalized anxiety disorder: Secondary | ICD-10-CM

## 2023-02-05 DIAGNOSIS — F4310 Post-traumatic stress disorder, unspecified: Secondary | ICD-10-CM

## 2023-02-05 NOTE — Progress Notes (Signed)
[facility] Counselor/Therapist Progress Note  Patient ID: [name], MRN: [id number]    Date: 02/05/23  Time Spent: 2:03 pm - 3:00 pm : 57 Minutes  Treatment Type: Individual Therapy.  Reported Symptoms: Depression and Anxiety.   Mental Status Exam: Appearance:  Casual     Behavior: Appropriate  Motor: Normal  Speech/Language:  Clear and Coherent  Affect: Appropriate  Mood: dysthymic  Thought process: normal  Thought content:   WNL  Sensory/Perceptual disturbances:   WNL  Orientation: oriented to person, place, time/date, and situation  Attention: Good  Concentration: Good  Memory: WNL  Fund of knowledge:  Good  Insight:   Good  Judgment:  Good  Impulse Control: Good   Risk Assessment: Danger to Self:  No Self-injurious Behavior: No Danger to Others: No Duty to Warn:no Physical Aggression / Violence:No  Access to Firearms a concern: No  Gang Involvement:No   Subjective:   [name] participated in the session from  the office with the therapist. [name] noted the events of the past week. She noted missing the most recent appointment due to illness but noted feeling better. She noted recently visiting her father in [place] and noted this going well, overall. She noted some stressors with her house mate and noted feeling triggered by a loud action movie. She noted tis being a point of contention in the past and discussed having to reaffirm boundaries. She noted this possibly being due to he mothers unpredictable and loud anger outbursts. We worked on identifying possible physiological signs of building frustration. [name] noted her heart-rate often increasing. She noted her interest in visiting a doll museum and is hopeful to plan a meet-up for this. Therapist praised [name] for her interest in engaging in enjoyable activities and planning to be more social. Therapist validated and normalized [name]'s feelings, vulnerability, and consistent engagement during the  session. Therapist provided supportive therapy.   Interventions: BA  Diagnosis:    Major depressive disorder, recurrent episode, in partial remission (HCC)  GAD (generalized anxiety disorder)  Posttraumatic stress disorder  Psychiatric Treatment: Yes , Dr. Fredda Hammed.  See chart.   Treatment Plan:  Client Abilities/Strengths [name] is intelligent, well spoken, and motivated for change.   Support System: Friends from church.   Client Treatment Preferences OPT  Client Statement of Needs [name] would like to increase motivation and task, becoming more social and date, managing overall mood, processing past events, bolstering coping skills, managing overall symptoms, & increasing mindfulness.     Treatment Level Weekly  Symptoms  Depression:  Loss of interest, feeling down, poor sleep, lethargy, poor appetite, difficulty concentrating  (Status: maintained) Anxiety: feeling nervous, worrying about different things, trouble relaxing, irritability, feeling afraid as if something awful might happen, anxiety leaving the home.   (Status: maintained)  Goals:   [name] experiences symptoms of anxiety, depression, and PTSD.   Target Date: 10/23/23 Frequency: Weekly  Progress: 0 Modality: individual    Therapist will provide referrals for additional resources as appropriate.  Therapist will provide psycho-education regarding [name]'s diagnosis and corresponding treatment approaches and interventions. Licensed Clinical Social Worker, [place], [name] will support the patient's ability to achieve the goals identified. will employ CBT, BA, Problem-solving, Solution Focused, Mindfulness,  coping skills, & other evidenced-based practices will be used to promote progress towards healthy functioning to help manage decrease symptoms associated with her diagnosis.   Reduce overall level, frequency, and intensity of the feelings of depression & anxiety evidenced by decreased overall symptoms  from 6 to 7  days/week to 0 to 1 days/week per client report for at least 3 consecutive months. Verbally express understanding of the relationship between feelings of depression, anxiety and their impact on thinking patterns and behaviors. Verbalize an understanding of the role that distorted thinking plays in creating fears, excessive worry, and ruminations.  [name] participated in the creation of the treatment plan)    [name], [name]

## 2023-02-19 ENCOUNTER — Ambulatory Visit (INDEPENDENT_AMBULATORY_CARE_PROVIDER_SITE_OTHER): Payer: PPO | Admitting: Psychology

## 2023-02-19 DIAGNOSIS — F4310 Post-traumatic stress disorder, unspecified: Secondary | ICD-10-CM

## 2023-02-19 DIAGNOSIS — F3341 Major depressive disorder, recurrent, in partial remission: Secondary | ICD-10-CM | POA: Diagnosis not present

## 2023-02-19 DIAGNOSIS — F411 Generalized anxiety disorder: Secondary | ICD-10-CM | POA: Diagnosis not present

## 2023-02-19 NOTE — Progress Notes (Signed)
[facility] Counselor/Therapist Progress Note  Patient ID: [name], MRN: [id number]    Date: 02/19/23  Time Spent: 2:00 pm - 2:47 pm : 47 Minutes  Treatment Type: Individual Therapy.  Reported Symptoms: Depression and Anxiety.   Mental Status Exam: Appearance:  Casual     Behavior: Appropriate  Motor: Normal  Speech/Language:  Clear and Coherent  Affect: Appropriate  Mood: dysthymic  Thought process: normal  Thought content:   WNL  Sensory/Perceptual disturbances:   WNL  Orientation: oriented to person, place, time/date, and situation  Attention: Good  Concentration: Good  Memory: WNL  Fund of knowledge:  Good  Insight:   Good  Judgment:  Good  Impulse Control: Good   Risk Assessment: Danger to Self:  No Self-injurious Behavior: No Danger to Others: No Duty to Warn:no Physical Aggression / Violence:No  Access to Firearms a concern: No  Gang Involvement:No   Subjective:   [name] participated in the session from  the office with the therapist. [name] noted the events of the past week.She noted working on investigating her hobbies and noted her intent to visit a nearby town for Forensic psychologist. Therapist praised [name] for her effort to follow-up on our goals. She noted increased communication with her father who lives in [place]. She noted continued grief for her mother and had hoped that her mother would apologize earlier as an avenue for a possible healthier relationship. She noted continued grief for her boyfriend who passed in 2022. She noted her attempts to engage in physical exercise. She noted barriers including wrangling her two dogs on a walk & worrying that taking dogs separately might become difficult. Therapist encouraged problem-solving and flexibility in this area. Therapist validated [name]'s feelings and experience and provided supportive therapy.   Interventions: BA  Diagnosis:    Major depressive disorder, recurrent episode, in partial  remission (HCC)  GAD (generalized anxiety disorder)  Posttraumatic stress disorder  Psychiatric Treatment: Yes , Dr. Fredda Hammed.  See chart.   Treatment Plan:  Client Abilities/Strengths [name] is intelligent, well spoken, and motivated for change.   Support System: Friends from church.   Client Treatment Preferences OPT  Client Statement of Needs [name] would like to increase motivation and task, becoming more social and date, managing overall mood, processing past events, bolstering coping skills, managing overall symptoms, & increasing mindfulness.     Treatment Level Weekly  Symptoms  Depression:  Loss of interest, feeling down, poor sleep, lethargy, poor appetite, difficulty concentrating  (Status: maintained) Anxiety: feeling nervous, worrying about different things, trouble relaxing, irritability, feeling afraid as if something awful might happen, anxiety leaving the home.   (Status: maintained)  Goals:   [name] experiences symptoms of anxiety, depression, and PTSD.   Target Date: 10/23/23 Frequency: Weekly  Progress: 0 Modality: individual    Therapist will provide referrals for additional resources as appropriate.  Therapist will provide psycho-education regarding [name]'s diagnosis and corresponding treatment approaches and interventions. Licensed Clinical Social Worker, [place], [name] will support the patient's ability to achieve the goals identified. will employ CBT, BA, Problem-solving, Solution Focused, Mindfulness,  coping skills, & other evidenced-based practices will be used to promote progress towards healthy functioning to help manage decrease symptoms associated with her diagnosis.   Reduce overall level, frequency, and intensity of the feelings of depression & anxiety evidenced by decreased overall symptoms from 6 to 7 days/week to 0 to 1 days/week per client report for at least 3 consecutive months. Verbally express understanding  of the relationship between  feelings of depression, anxiety and their impact on thinking patterns and behaviors. Verbalize an understanding of the role that distorted thinking plays in creating fears, excessive worry, and ruminations.  [name] participated in the creation of the treatment plan)    Abigail Ovens, [name]

## 2023-02-20 ENCOUNTER — Telehealth (HOSPITAL_COMMUNITY): Payer: Self-pay | Admitting: *Deleted

## 2023-02-20 DIAGNOSIS — F3341 Major depressive disorder, recurrent, in partial remission: Secondary | ICD-10-CM

## 2023-02-20 NOTE — Telephone Encounter (Signed)
[facility] #[id number] - [place], [place] - [address]   sertraline (ZOLOFT) 100 MG tablet [[id number]]   Order Details Dose, Route, Frequency: As Directed  Dispense Quantity: 180 tablet Refills: 0        Sig: TAKE 2 TABLETS BY MOUTH EVERY DAY        Last appt-- 12/27/22 Next appt--06/25/23

## 2023-02-21 MED ORDER — SERTRALINE HCL 100 MG PO TABS: ORAL_TABLET | ORAL | 0 refills | Status: DC

## 2023-02-21 NOTE — Addendum Note (Signed)
Addended by: Thresa Ross on: 02/21/2023 08:51 AM   Modules accepted: Orders

## 2023-03-01 ENCOUNTER — Other Ambulatory Visit: Payer: Self-pay

## 2023-03-01 MED ORDER — BD PEN NEEDLE NANO 2ND GEN 32G X 4 MM MISC: 3 refills | Status: DC

## 2023-03-11 ENCOUNTER — Ambulatory Visit: Payer: PPO | Admitting: Psychology

## 2023-03-19 ENCOUNTER — Ambulatory Visit (INDEPENDENT_AMBULATORY_CARE_PROVIDER_SITE_OTHER): Payer: PPO | Admitting: Internal Medicine

## 2023-03-19 ENCOUNTER — Encounter: Payer: Self-pay | Admitting: Internal Medicine

## 2023-03-19 VITALS — BP 122/80 | HR 50 | Ht 61.0 in | Wt 173.0 lb

## 2023-03-19 DIAGNOSIS — Z794 Long term (current) use of insulin: Secondary | ICD-10-CM | POA: Diagnosis not present

## 2023-03-19 DIAGNOSIS — E782 Mixed hyperlipidemia: Secondary | ICD-10-CM

## 2023-03-19 DIAGNOSIS — E119 Type 2 diabetes mellitus without complications: Secondary | ICD-10-CM

## 2023-03-19 DIAGNOSIS — E039 Hypothyroidism, unspecified: Secondary | ICD-10-CM | POA: Diagnosis not present

## 2023-03-19 DIAGNOSIS — E1165 Type 2 diabetes mellitus with hyperglycemia: Secondary | ICD-10-CM | POA: Diagnosis not present

## 2023-03-19 LAB — HEMOGLOBIN A1C: Hemoglobin A1C: 6.5

## 2023-03-19 NOTE — Patient Instructions (Addendum)
Please continue: - Lantus 20 units in am - Novolog  10-12 units before a smaller meal 16 units before a regular meal 22-26 units before a high-carb meal  If you take insulin after the meal, take 2/3 of the insulin dose depending on the time since the meal.   Please continue: - Levothyroxine 100 mg daily  Take the thyroid hormone every day, with water, at least 30 minutes before breakfast, separated by at least 4 hours from: - acid reflux medications - calcium - iron - multivitamins   Please return in 4 months.

## 2023-03-19 NOTE — Progress Notes (Addendum)
Patient ID: [name], female   DOB: [birth year], 57 y.o.   MRN: [id number]  HPI: [name] is a 57 y.o.-year-old female, returning for f/u for DM2, dx 2013, insulin-dependent, uncontrolled, with complications (diabetic retinopathy) and also hypothyroidism. Last visit 4 months ago.  Interim history: She had increased stress since last visit, as her mother passed away in 2023-02-18.  She is thinking about bringing her father from [place] to [place]. No increased urination, blurry vision, chest pain. Tremor increased. She has increased sugar cravings.   DM2: Reviewed HbA1c levels: Lab Results  Component Value Date   HGBA1C 6.1 (A) 11/02/2022   HGBA1C 7.2 (A) 06/27/2022   HGBA1C 6.2 (A) 02/23/2022   Pt is on a regimen of: - Lantus 12 >> ... 20 >> 25 >> 20 >> 20 units at bedtime >> 24 >> 20 units in am - NovoLog 15-20 units 15 minutes before each meal, 24-26 >> 10-12 units for protein shake; 16 units before a regular meal; 22-25 for a high carb meal We stopped Cycloset in 06/2020. She did not start Welchol 2 tabs 2x a day - added 03/2017 - too expensive On Turmeric, alpha-lipoic acid + 660 mcg biotin per day, Garlicin. We stopped Glipizide when we started NovoLog.  She has various intolerances to different diabetic medicines: Metformin ER >> in the past, she had gastric discomfort and severe diarrhea (loss of bowel control) Januvia >> tried for 1-2 months >> left chest pain She was wondering if she could take Invokana, however she had multiple vaginal yeast infections. She Also had nocturia 3-4 x a night.  She had reactive hypoglycemia symptoms while on Quercetin (was taking it for allergies)  She stopped Rybelsus 7 mg >> stopped 2/2 N and D   She checks her sugars more than 4 times a day with her libre CGM:  Previously:  Previously:  Lowest: LO x2 >> .Marland Kitchen. 100 >> 40 >> 50. Highest: 455 >> ...  >250 >> 200 >> 350  She has many food intolerances: - Gluten >> cannot eat bread or  pastry.  - raw vegetables, beans, cruciferous vegetables, fruit >> diarrhea.  - No dairy but, cereals  -at night -now off ice cream In the past, no she was taking up to 36 units of NovoLog for ice cream.  I strongly advised her to stop this in the past. She does not digest fat well after her cholecystectomy in 2011.   Meter: One Touch Mini  No CKD, last BUN/creatinine was:  Lab Results  Component Value Date   BUN 15 03/26/2022   CREATININE 0.71 03/26/2022  On irbesartan.  + HL: Lab Results  Component Value Date   CHOL 346 (H) 02/22/2022   HDL 130.70 02/22/2022   LDLCALC 191 (H) 02/22/2022   LDLDIRECT 205.0 01/24/2016   TRIG 121.0 02/22/2022   CHOLHDL 3 02/22/2022  Previous cholesterol levels were: 325/197/132/154.   She could not tolerate pravastatin or Livalo due to muscle aches.  She refused a referral to lipid clinic in the past for PCSK9 inhibitors.  On fluvastatin XL + CoQ10. I suggested Zetia but she refused due to the possibility of developing diarrhea on it.  - last dilated eye exam was in 06/2022: + DR. Dr. Marvis Moeller.   - + Numbness and tingling in her legs.  She is on the R enantiomer of alpha-lipoic acid.  Last foot exam 11/02/2022.  She also has a history of PCOS- she has seen Dr. Rosine Door at [facility]  in the past - note from 08/26/2012: perimenopause, and at that time she was on Yasmin, subsequently changed to Necon. She sees Dr. Marvel Plan with OB/GYN. She has PTSD, MDD, insomnia, anxiety, asthma, HTN, GERD, status post cholecystectomy, anemia, transaminitis. She also had increased uterine bleeding (on Necon). She sawan acupuncturist. She takes Nettle powder >> helped her with diarrhea, anemia, insomnia. 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 Also, Undecylenic acid and mastic gum, no carbs x 1-2 months.  She has IBS >> better on L- plantarum, S.bulardii - Brevibacillus Laterosporus 20 min before b'fast.   Hypothyroidism   -Diagnosed in 02/2018. We started levothyroxine 03/2018.  Pt is on levothyroxine 100 mcg daily, dose increased at last visit, taken: - in am - fasting - at least 30 min from b'fast - + calcium in butyrate later in the day - no iron - no multivitamins - no PPIs - not on Biotin On Kelp - 1 capsule a day.  Reviewed her TFTs: Lab Results  Component Value Date   TSH 1.78 09/27/2022   TSH 6.06 (H) 06/27/2022   TSH 8.33 (H) 02/22/2022   TSH 2.02 02/06/2021   TSH 2.82 07/01/2020   TSH 6.44 (H) 03/22/2020   TSH 2.96 04/01/2019   TSH 1.26 07/30/2018   TSH 10.64 (H) 04/04/2018   TSH 4.80 (H) 03/13/2018   FREET4 0.88 06/27/2022   FREET4 0.91 02/22/2022   FREET4 0.63 02/06/2021   FREET4 0.78 07/01/2020   FREET4 0.91 03/22/2020   FREET4 0.60 04/01/2019   FREET4 0.84 07/30/2018   FREET4 0.72 04/04/2018   T3FREE 2.6 04/04/2018   Her antithyroid antibodies were not elevated: Component     Latest Ref Rng & Units 04/04/2018  Thyroglobulin Ab     < or = 1 IU/mL <1  Thyroperoxidase Ab SerPl-aCnc     <9 IU/mL 2   No FH of thyroid cancer. No h/o radiation tx to head or neck. No Biotin use. No recent steroids use.   Pt denies: - feeling nodules in neck - hoarseness - dysphagia - choking  She takes vitamin D 5000 units.  ROS: + See HPI  I reviewed pt's medications, allergies, PMH, social hx, family hx, and changes were documented in the history of present illness. Otherwise, unchanged from my initial visit note.  Past Medical History:  Diagnosis Date   Abnormal Papanicolaou smear of cervix with positive human papilloma virus (HPV) test 02/2017   Anxiety    Asthma    Depression    Diabetes mellitus type II    Food intolerance    Headache    High blood pressure    IBS (irritable bowel syndrome)    Insomnia    Migraines    PCOS (polycystic ovarian syndrome)    PTSD (post-traumatic stress disorder)    Seasonal allergies    Thyroid disease    Past Surgical History:   Procedure Laterality Date   CHOLECYSTECTOMY     GALLBLADDER SURGERY     Social History   Socioeconomic History   Marital status: Single    Spouse name: Not on file   Number of children: 0   Years of education: 81   Highest education level: Master's degree (e.g., MA, MS, MEng, MEd, MSW, MBA)  Occupational History   Occupation: Long Term Disability    Comment: PTSD  Tobacco Use   Smoking status: Never   Smokeless tobacco: Never  Vaping Use   Vaping Use: Never  used  Substance and Sexual Activity   Alcohol use: Yes    Alcohol/week: 1.0 standard drink of alcohol    Types: 1 Standard drinks or equivalent per week    Comment: 1 drink a week   Drug use: No   Sexual activity: Not Currently    Partners: Male    Comment: 1st intercourse- 54, partners more than 5  Other Topics Concern   Not on file  Social History Narrative   Regular exercise: yes, walk   Caffeine use: occasionally tea   Fun: Social group events, walking her dogs, movies   Denies abuse and feels safe where she lives.    Social Determinants of Health   Financial Resource Strain: Low Risk  (01/08/2022)   Overall Financial Resource Strain (CARDIA)    Difficulty of Paying Living Expenses: Not hard at all  Food Insecurity: No Food Insecurity (01/08/2022)   Hunger Vital Sign    Worried About Running Out of Food in the Last Year: Never true    Ran Out of Food in the Last Year: Never true  Transportation Needs: No Transportation Needs (01/08/2022)   PRAPARE - Administrator, Civil Service (Medical): No    Lack of Transportation (Non-Medical): No  Physical Activity: Inactive (01/08/2022)   Exercise Vital Sign    Days of Exercise per Week: 0 days    Minutes of Exercise per Session: 0 min  Stress: No Stress Concern Present (01/08/2022)   [employer] of Occupational Health - Occupational Stress Questionnaire    Feeling of Stress : Not at all  Social Connections: Socially Isolated (01/08/2022)   Social  Connection and Isolation Panel [NHANES]    Frequency of Communication with Friends and Family: Twice a week    Frequency of Social Gatherings with Friends and Family: More than three times a week    Attends Religious Services: Never    Database administrator or Organizations: No    Attends Banker Meetings: Never    Marital Status: Never married  Intimate Partner Violence: Not At Risk (01/08/2022)   Humiliation, Afraid, Rape, and Kick questionnaire    Fear of Current or Ex-Partner: No    Emotionally Abused: No    Physically Abused: No    Sexually Abused: No   Current Outpatient Medications on File Prior to Visit  Medication Sig Dispense Refill   albuterol (VENTOLIN HFA) 108 (90 Base) MCG/ACT inhaler INHALE 2 PUFFS BY MOUTH EVERY 4 TO 6 HOURS AS DIRECTED(RESCUE) 42.5 g 0   Alpha-Lipoic Acid 600 MG CAPS Take 300 mg by mouth 2 (two) times daily.      atenolol (TENORMIN) 100 MG tablet TAKE 1 TABLET(100 MG) BY MOUTH TWICE DAILY 180 tablet 3   Blood Glucose Monitoring Suppl (ONE TOUCH ULTRA MINI) w/Device KIT Use to check blood sugar 2 times a day. 1 kit 0   Blood Glucose Monitoring Suppl (ONETOUCH VERIO) w/Device KIT Use as instructed to check blood sugar 4X daily 1 kit 0   clobetasol cream (TEMOVATE) 0.05 % Apply 1 application topically 2 (two) times daily. (Patient not taking: Reported on 12/27/2022) 30 g 1   clonazePAM (KLONOPIN) 0.5 MG tablet Take half a day 15 tablet 1   Continuous Blood Gluc Sensor (FREESTYLE LIBRE 14 DAY SENSOR) MISC Use for continuous glucose monitoring. 6 each 3   Digestive Enzymes (ENZYME DIGEST PO) Take by mouth. (Patient not taking: Reported on 12/27/2022)     fexofenadine (ALLEGRA) 180 MG tablet Take  180 mg by mouth daily.     fluvastatin XL (LESCOL XL) 80 MG 24 hr tablet TAKE 1 TABLET(80 MG) BY MOUTH DAILY 90 tablet 3   glucose blood (ONETOUCH ULTRA) test strip Use to check blood sugar once a day. (Patient not taking: Reported on 12/27/2022) 100 strip 11    glucose blood (ONETOUCH VERIO) test strip Use as instructed to check blood sugar 4X daily (Patient not taking: Reported on 12/27/2022) 300 each 2   Insulin Aspart FlexPen (NOVOLOG) 100 UNIT/ML ADMINISTER 10 TO 30 UNITS UNDER THE SKIN THREE TIMES DAILY BEFORE MEALS 90 mL 1   Insulin Pen Needle (BD PEN NEEDLE NANO 2ND GEN) 32G X 4 MM MISC USE 4 TIMES DAILY 400 each 3   irbesartan (AVAPRO) 75 MG tablet TAKE 1 TABLET(75 MG) BY MOUTH DAILY 90 tablet 3   LANTUS SOLOSTAR 100 UNIT/ML Solostar Pen Inject 20 Units into the skin daily. 30 mL 3   levothyroxine (SYNTHROID) 100 MCG tablet TAKE 1 TABLET(100 MCG) BY MOUTH DAILY 90 tablet 3   methocarbamol (ROBAXIN) 500 MG tablet TAKE 1 TABLET(500 MG) BY MOUTH EVERY 8 HOURS AS NEEDED FOR MUSCLE SPASMS 60 tablet 1   NOVOLOG FLEXPEN 100 UNIT/ML FlexPen Inject 10-30 Units into the skin 3 (three) times daily with meals. 90 mL 0   Olopatadine HCl (PATADAY OP) Apply to eye.     OVER THE COUNTER MEDICATION 1 capsule daily. Probiotic renew life      sertraline (ZOLOFT) 100 MG tablet TAKE 2 TABLETS BY MOUTH EVERY DAY 180 tablet 0   [DISCONTINUED] EFFEXOR XR 150 MG 24 hr capsule 375 mg daily.      No current facility-administered medications on file prior to visit.   Allergies  Allergen Reactions   Latex Itching and Swelling   Fluticasone     Nose bleeds   Metformin And Related     GI side effects   Pravastatin     myalgias   Family History  Problem Relation Age of Onset   Bipolar disorder Mother    Hypertension Mother    Diabetes Mother    Alzheimer's disease Father    Hypertension Father    Alzheimer's disease Maternal Grandmother    Heart disease Paternal Grandfather    PE: BP 122/80   Pulse (!) 50   Ht 5\' 1"  (1.549 m)   Wt 173 lb (78.5 kg)   SpO2 99%   BMI 32.69 kg/m    Wt Readings from Last 3 Encounters:  03/19/23 173 lb (78.5 kg)  11/02/22 170 lb (77.1 kg)  08/17/22 171 lb 9.6 oz (77.8 kg)   Constitutional: overweight, in NAD Eyes:   EOMI, no exophthalmos ENT: no neck masses, no cervical lymphadenopathy Cardiovascular: RRR, No MRG Respiratory: CTA B Musculoskeletal: no deformities Skin:no rashes Neurological: no tremor with outstretched hands  ASSESSMENT: 1. DM2, insulin-dependent, fairly well controlled, with complications - DR  2. HL - Very high LDL - high HDL - slightly high TG  3.  Hypothyroidism  PLAN:  1. DM2 - Patient with insulin-dependent type 2 diabetes, with suboptimal control, mostly due to many medication intolerances and binge eating disorder.  Currently on a basal-bolus insulin regimen, with improved control at last visit: HbA1c at that time returned 6.1%, improved.  We decreased her Lantus dose due to occasional lows (as low as 40).  There was increased variability of her blood sugars after dinner and hyperglycemic spikes after high carb meals, but these were not very frequent. CGM  interpretation: -At today's visit, we reviewed her CGM downloads: It appears that 72% of values are in target range (goal >70%), while 20% are higher than 180 (goal <25%), and 8% are lower than 70 (goal <4%).  The calculated average blood sugar is 138.  The projected HbA1c for the next 3 months (GMI) is 6.6%. -Reviewing the CGM trends, sugars appear to be fluctuating, with higher values in the second half of the day, but also with quite significant lows especially after an initial increase in blood sugars after meals.  Upon questioning, she is forgetting to take the NovoLog before meals and she takes a large dose after the meal.  We discussed that this is conducive to high postprandial blood sugars and then significant drops.  I advised her that ideally she would take the insulin 15 minutes before each meal, but if she needs to take it later, to reduce the dose by at least one third.  For now we will continue the current regimen but I did advise her to let me know if she has more lows. - I advised her to:  Patient Instructions   Please continue: - Lantus 20 units in am - Novolog  10-12 units before a smaller meal 16 units before a regular meal 22-26 units before a high-carb meal  If you take insulin after the meal, take 2/3 of the insulin dose depending on the time since the meal.   Please continue: - Levothyroxine 100 mg daily  Take the thyroid hormone every day, with water, at least 30 minutes before breakfast, separated by at least 4 hours from: - acid reflux medications - calcium - iron - multivitamins   Please return in 4 months.  - we checked her HbA1c: 6.5% (higher) - advised to check sugars at different times of the day - 4x a day, rotating check times - advised for yearly eye exams >> she is UTD - return to clinic in 4 months  2. HL -Reviewed the latest lipid panel from 02/2022: Very high LDL: Lab Results  Component Value Date   CHOL 346 (H) 02/22/2022   HDL 130.70 02/22/2022   LDLCALC 191 (H) 02/22/2022   LDLDIRECT 205.0 01/24/2016   TRIG 121.0 02/22/2022   CHOLHDL 3 02/22/2022  -She could not tolerate Livalo.  I also suggested Zetia but she declined that she read that this could cause diarrhea. -She declined a referral to the lipid clinic for PCSK9 inhibitors. -She takes fluvastatin XL inconsistently -She continues to eat ice cream and other sweets  3.   Hypothyroidism - latest thyroid labs reviewed with pt. >> normal: Lab Results  Component Value Date   TSH 1.78 09/27/2022  - she continues on LT4 100 mcg daily - pt feels good on this dose. - we discussed about taking the thyroid hormone every day, with water, >30 minutes before breakfast, separated by >4 hours from acid reflux medications, calcium, iron, multivitamins. Pt. is taking it correctly.  At the end of the visit, patient was getting down the examiner's table on the lateral side of the table, not using the step, and she slid forward, to the ground, near a small chair. Her elbow skin was scraped, but not to the point of  blood and no other contusion.  She could get up without problems.  Patient was left resting in the chair and given water. She reported no pain or other discomfort before leaving the clinic.  Carlus Pavlov, MD PhD [facility]

## 2023-03-26 ENCOUNTER — Ambulatory Visit (INDEPENDENT_AMBULATORY_CARE_PROVIDER_SITE_OTHER): Payer: PPO | Admitting: Psychology

## 2023-03-26 DIAGNOSIS — F3341 Major depressive disorder, recurrent, in partial remission: Secondary | ICD-10-CM

## 2023-03-26 DIAGNOSIS — F411 Generalized anxiety disorder: Secondary | ICD-10-CM

## 2023-03-26 DIAGNOSIS — F4310 Post-traumatic stress disorder, unspecified: Secondary | ICD-10-CM
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
## 2023-04-09 ENCOUNTER — Ambulatory Visit (INDEPENDENT_AMBULATORY_CARE_PROVIDER_SITE_OTHER): Payer: PPO | Admitting: Psychology

## 2023-04-09 DIAGNOSIS — F4310 Post-traumatic stress disorder, unspecified: Secondary | ICD-10-CM | POA: Diagnosis not present

## 2023-04-09 DIAGNOSIS — F411 Generalized anxiety disorder: Secondary | ICD-10-CM

## 2023-04-09 DIAGNOSIS — F3341 Major depressive disorder, recurrent, in partial remission: Secondary | ICD-10-CM | POA: Diagnosis not present
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
## 2023-04-10 ENCOUNTER — Telehealth: Payer: Self-pay

## 2023-04-10 NOTE — Telephone Encounter (Signed)
Reached out to patient to set up follow up visit with provider to discuss chronic conditions.  Telephone encounter attempt : 1   A HIPAA compliant voice message was left requesting a return call.  Instructed patient to call office or to call me at [phone].  Elijio [facility] Specialist

## 2023-04-11 ENCOUNTER — Telehealth: Payer: Self-pay | Admitting: Family

## 2023-04-11 NOTE — Telephone Encounter (Signed)
Copied from CRM [id number]. Topic: Medicare AWV >> Apr 11, 2023 12:01 PM Payton Doughty wrote: Reason for CRM: LM 04/11/2023 to schedule AWV   Verlee Rossetti; Care Guide Ambulatory Clinical Support [place] [facility] Direct Dial: [phone]

## 2023-04-23 ENCOUNTER — Ambulatory Visit (INDEPENDENT_AMBULATORY_CARE_PROVIDER_SITE_OTHER): Payer: PPO | Admitting: Psychology

## 2023-04-23 DIAGNOSIS — F411 Generalized anxiety disorder: Secondary | ICD-10-CM | POA: Diagnosis not present

## 2023-04-23 DIAGNOSIS — F4310 Post-traumatic stress disorder, unspecified: Secondary | ICD-10-CM

## 2023-04-23 DIAGNOSIS — F3341 Major depressive disorder, recurrent, in partial remission: Secondary | ICD-10-CM

## 2023-04-23 NOTE — Progress Notes (Signed)
[facility] Counselor/Therapist Progress Note  Patient ID: [name], MRN: [id number]    Date: 04/23/23  Time Spent: 1:04  pm - 1:54 pm : 50 Minutes  Treatment Type: Individual Therapy.  Reported Symptoms: Depression and Anxiety.   Mental Status Exam: Appearance:  Casual     Behavior: Appropriate  Motor: Normal  Speech/Language:  Clear and Coherent  Affect: Appropriate  Mood: dysthymic  Thought process: normal  Thought content:   WNL  Sensory/Perceptual disturbances:   WNL  Orientation: oriented to person, place, time/date, and situation  Attention: Good  Concentration: Good  Memory: WNL  Fund of knowledge:  Good  Insight:   Good  Judgment:  Good  Impulse Control: Good   Risk Assessment: Danger to Self:  No Self-injurious Behavior: No Danger to Others: No Duty to Warn:no Physical Aggression / Violence:No  Access to Firearms a concern: No  Gang Involvement:No   Subjective:   [name] participated in the session from  the office with the therapist. [name] noted the events of the past week. She noted the stressors of her phone malfunctioning and the stress related to getting this issue addressed. She noted locating a texts from her boyfriend, years ago, that were positive and meaningful. She noted being invited by a member of [employer] to attend a functioning. She reflected upon her previous jobs and it being enjoyable and social. We processed this during the session and [name] noted that she has not kept contact wit her coworkers, while she was living in other states She noted a goal of becoming more social and build a sense of community. She noted resolving interpersonal issues with roommate. We explored this during the session and her flexibility in this area. Therapist praised [name] for her problem-solving in this area. Therapist encouraged [name] to engage in socializing and to build towards her goal of increased socializing and expending her social  support network. She reflect on past relationships and discussed unmet needs and difficulty receiving compromise from her partner which led to the end of her relationship. Therapist validated and normalized her experience and provided supportive therapy. A follow-up was scheduled for continued treatment.   Interventions: Interpersonal   Diagnosis:    Major depressive disorder, recurrent episode, in partial remission (HCC)  GAD (generalized anxiety disorder)  Posttraumatic stress disorder  Psychiatric Treatment: Yes , Dr. Fredda Hammed.  See chart.   Treatment Plan:  Client Abilities/Strengths Zeyah is intelligent, well spoken, and motivated for change.   Support System: Friends from church.   Client Treatment Preferences OPT  Client Statement of Needs [name] would like to increase motivation and task, becoming more social and date, managing overall mood, processing past events, bolstering coping skills, managing overall symptoms, & increasing mindfulness.     Treatment Level Weekly  Symptoms  Depression:  Loss of interest, feeling down, poor sleep, lethargy, poor appetite, difficulty concentrating  (Status: maintained) Anxiety: feeling nervous, worrying about different things, trouble relaxing, irritability, feeling afraid as if something awful might happen, anxiety leaving the home.   (Status: maintained)  Goals:   [name] experiences symptoms of anxiety, depression, and PTSD.   Target Date: 10/23/23 Frequency: Weekly  Progress: 0 Modality: individual    Therapist will provide referrals for additional resources as appropriate.  Therapist will provide psycho-education regarding [name]'s diagnosis and corresponding treatment approaches and interventions. Licensed Clinical Social Worker, [place], [name] will support the patient's ability to achieve the goals identified. will employ CBT, BA, Problem-solving, Solution Focused,  Mindfulness,  coping skills, & other evidenced-based  practices will be used to promote progress towards healthy functioning to help manage decrease symptoms associated with her diagnosis.   Reduce overall level, frequency, and intensity of the feelings of depression & anxiety evidenced by decreased overall symptoms from 6 to 7 days/week to 0 to 1 days/week per client report for at least 3 consecutive months. Verbally express understanding of the relationship between feelings of depression, anxiety and their impact on thinking patterns and behaviors. Verbalize an understanding of the role that distorted thinking plays in creating fears, excessive worry, and ruminations.  [name] participated in the creation of the treatment plan)    [name], [name]

## 2023-05-06 ENCOUNTER — Other Ambulatory Visit (HOSPITAL_COMMUNITY): Payer: Self-pay | Admitting: Psychiatry

## 2023-05-07 ENCOUNTER — Ambulatory Visit (INDEPENDENT_AMBULATORY_CARE_PROVIDER_SITE_OTHER): Payer: PPO | Admitting: Psychology

## 2023-05-07 ENCOUNTER — Ambulatory Visit (INDEPENDENT_AMBULATORY_CARE_PROVIDER_SITE_OTHER): Payer: PPO | Admitting: Family

## 2023-05-07 VITALS — BP 122/70 | HR 50 | Temp 97.8°F | Resp 18 | Ht 60.6 in | Wt 173.2 lb

## 2023-05-07 DIAGNOSIS — F4310 Post-traumatic stress disorder, unspecified: Secondary | ICD-10-CM | POA: Diagnosis not present

## 2023-05-07 DIAGNOSIS — F411 Generalized anxiety disorder: Secondary | ICD-10-CM | POA: Diagnosis not present

## 2023-05-07 DIAGNOSIS — N951 Menopausal and female climacteric states: Secondary | ICD-10-CM

## 2023-05-07 DIAGNOSIS — F3341 Major depressive disorder, recurrent, in partial remission: Secondary | ICD-10-CM

## 2023-05-07 MED ORDER — ESTRADIOL-LEVONORGESTREL 0.045-0.015 MG/DAY TD PTWK: 1.0000 | MEDICATED_PATCH | TRANSDERMAL | 4 refills | Status: DC

## 2023-05-07 NOTE — Patient Instructions (Signed)
Please go ahead and call GYN to get established with new provider; I have sent in a topical HRT patch called Climarapro for you. You would replace the patch once a week. If this does not help, please talk to GYN about other options.

## 2023-05-07 NOTE — Progress Notes (Signed)
[name] is a 57 y.o. female with the following history as recorded in EpicCare:  Patient Active Problem List   Diagnosis Date Noted   Class 1 obesity 12/22/2019   ASCUS with positive high risk HPV cervical 03/13/2017   Type 2 diabetes mellitus with hyperglycemia, without long-term current use of insulin (HCC) 03/28/2016   Routine general medical examination at a health care facility 01/24/2016   Acquired hypothyroidism 01/24/2016   Anemia 04/14/2014   Hyperlipidemia 08/18/2013   Essential hypertension 08/26/2012   Irregular menses 08/07/2012   History of abnormal Pap smear 08/07/2012   GERD (gastroesophageal reflux disease) 08/07/2012   Asthma, mild persistent 08/07/2012   Migraine 08/07/2012   History of PCOS 08/07/2012   Genital warts 08/07/2012   S/P cholecystectomy 08/07/2012   Seasonal and perennial allergic rhinitis 08/07/2012   Severe episode of recurrent major depressive disorder (HCC) 12/06/2011   PTSD (post-traumatic stress disorder) 11/02/2011    Current Outpatient Medications  Medication Sig Dispense Refill   albuterol (VENTOLIN HFA) 108 (90 Base) MCG/ACT inhaler INHALE 2 PUFFS BY MOUTH EVERY 4 TO 6 HOURS AS DIRECTED(RESCUE) 42.5 g 0   Alpha-Lipoic Acid 600 MG CAPS Take 300 mg by mouth 2 (two) times daily.      atenolol (TENORMIN) 100 MG tablet TAKE 1 TABLET(100 MG) BY MOUTH TWICE DAILY 180 tablet 3   Blood Glucose Monitoring Suppl (ONE TOUCH ULTRA MINI) w/Device KIT Use to check blood sugar 2 times a day. 1 kit 0   Blood Glucose Monitoring Suppl (ONETOUCH VERIO) w/Device KIT Use as instructed to check blood sugar 4X daily 1 kit 0   clobetasol cream (TEMOVATE) 0.05 % Apply 1 application topically 2 (two) times daily. 30 g 1   clonazePAM (KLONOPIN) 0.5 MG tablet TAKE 1/2 TABLET BY MOUTH DAILY 15 tablet 1   Continuous Blood Gluc Sensor (FREESTYLE LIBRE 14 DAY SENSOR) MISC Use for continuous glucose monitoring. 6 each 3   Digestive Enzymes (ENZYME DIGEST PO) Take by  mouth.     estradiol-levonorgestrel (CLIMARAPRO) 0.045-0.015 MG/DAY Place 1 patch onto the skin once a week. 4 patch 4   fexofenadine (ALLEGRA) 180 MG tablet Take 180 mg by mouth daily.     fluvastatin XL (LESCOL XL) 80 MG 24 hr tablet TAKE 1 TABLET(80 MG) BY MOUTH DAILY 90 tablet 3   glucose blood (ONETOUCH ULTRA) test strip Use to check blood sugar once a day. 100 strip 11   glucose blood (ONETOUCH VERIO) test strip Use as instructed to check blood sugar 4X daily 300 each 2   Insulin Aspart FlexPen (NOVOLOG) 100 UNIT/ML ADMINISTER 10 TO 30 UNITS UNDER THE SKIN THREE TIMES DAILY BEFORE MEALS 90 mL 1   Insulin Pen Needle (BD PEN NEEDLE NANO 2ND GEN) 32G X 4 MM MISC USE 4 TIMES DAILY 400 each 3   irbesartan (AVAPRO) 75 MG tablet TAKE 1 TABLET(75 MG) BY MOUTH DAILY 90 tablet 3   LANTUS SOLOSTAR 100 UNIT/ML Solostar Pen Inject 20 Units into the skin daily. 30 mL 3   levothyroxine (SYNTHROID) 100 MCG tablet TAKE 1 TABLET(100 MCG) BY MOUTH DAILY 90 tablet 3   methocarbamol (ROBAXIN) 500 MG tablet TAKE 1 TABLET(500 MG) BY MOUTH EVERY 8 HOURS AS NEEDED FOR MUSCLE SPASMS 60 tablet 1   NOVOLOG FLEXPEN 100 UNIT/ML FlexPen Inject 10-30 Units into the skin 3 (three) times daily with meals. 90 mL 0   Olopatadine HCl (PATADAY OP) Apply to eye.     OVER THE COUNTER  MEDICATION 1 capsule daily. Probiotic renew life      sertraline (ZOLOFT) 100 MG tablet TAKE 2 TABLETS BY MOUTH EVERY DAY 180 tablet 0   No current facility-administered medications for this visit.    Allergies: Latex, Fluticasone, Metformin and related, and Pravastatin  Past Medical History:  Diagnosis Date   Abnormal Papanicolaou smear of cervix with positive human papilloma virus (HPV) test 02/2017   Anxiety    Asthma    Depression    Diabetes mellitus type II    Food intolerance    Headache    High blood pressure    IBS (irritable bowel syndrome)    Insomnia    Migraines    PCOS (polycystic ovarian syndrome)    PTSD (post-traumatic  stress disorder)    Seasonal allergies    Thyroid disease     Past Surgical History:  Procedure Laterality Date   CHOLECYSTECTOMY     GALLBLADDER SURGERY      Family History  Problem Relation Age of Onset   Bipolar disorder Mother    Hypertension Mother    Diabetes Mother    Alzheimer's disease Father    Hypertension Father    Alzheimer's disease Maternal Grandmother    Heart disease Paternal Grandfather     Social History   Tobacco Use   Smoking status: Never   Smokeless tobacco: Never  Substance Use Topics   Alcohol use: Yes    Alcohol/week: 1.0 standard drink of alcohol    Types: 1 Standard drinks or equivalent per week    Comment: 1 drink a week    Subjective:   Would like to discuss HRT options; was told by her GYN that officially in menopause in early January 2024; her GYN has left recently and she has not established with new provider yet; would like to use a patch formulation if possible; does have intact uterus; took Xulane patch until last year due to irregular bleeding; noticing that menopausal symptoms are affecting her mental health;    Objective:  Vitals:   05/07/23 1452  BP: 122/70  Pulse: (!) 50  Resp: 18  Temp: 97.8 F (36.6 C)  TempSrc: Temporal  SpO2: 96%  Weight: 173 lb 3.2 oz (78.6 kg)  Height: 5' 0.6" (1.539 m)    General: Well developed, well nourished, in no acute distress  Skin : Warm and dry.  Head: Normocephalic and atraumatic  Lungs: Respirations unlabored;  Neurologic: Alert and oriented; speech intact; face symmetrical; moves all extremities well; CNII-XII intact without focal deficit   Assessment:  1. Menopausal symptoms     Plan:  Discussed risks/ benefits of HRT and patient is comfortable. She was previously on Xulane and did well. I did readily explain to patient that I did not have a lot of experience with HRT in patch form but would be willing to prescribe a common treatment for her. She will plan to go ahead and establish  with GYN to take over management/ discuss other options if this treatment is not as effective as we would like it to be.   No follow-ups on file.  No orders of the defined types were placed in this encounter.   Requested Prescriptions   Signed Prescriptions Disp Refills   estradiol-levonorgestrel (CLIMARAPRO) 0.045-0.015 MG/DAY 4 patch 4    Sig: Place 1 patch onto the skin once a week.

## 2023-05-07 NOTE — Progress Notes (Signed)
[facility] Counselor/Therapist Progress Note  Patient ID: [name], MRN: [id number]    Date: 05/07/23  Time Spent: 10:59  am - 10:50 am : 51  Minutes  Treatment Type: Individual Therapy.  Reported Symptoms: Depression and Anxiety.   Mental Status Exam: Appearance:  Casual     Behavior: Appropriate  Motor: Normal  Speech/Language:  Clear and Coherent  Affect: Appropriate  Mood: dysthymic  Thought process: normal  Thought content:   WNL  Sensory/Perceptual disturbances:   WNL  Orientation: oriented to person, place, time/date, and situation  Attention: Good  Concentration: Good  Memory: WNL  Fund of knowledge:  Good  Insight:   Good  Judgment:  Good  Impulse Control: Good   Risk Assessment: Danger to Self:  No Self-injurious Behavior: No Danger to Others: No Duty to Warn:no Physical Aggression / Violence:No  Access to Firearms a concern: No  Gang Involvement:No   Subjective:   [name] participated in the session from  the office with the therapist. [name] noted the events of the past week. She noted her father electing to stay in [facility] and noted her ability to visit him regularly. She noted improvement in her living situation and noted this being a relief. She noted her efforts to work on eating healthfully and noted an increased need for protein as time passes. She noted reflecting on the passing of a pet she had in the past, developing health issues, and passing unexpectedly. She noted a need to work on organizing her home and discussed a goal of working on this 15-30 minutes a day. We discussed the importance of creating, scheduling reminders, preparing for the task including the necessary tools, and identifying any possible barriers. [name] was engaged and motivated during the session and expressed commitment towards our goals. Therapist praised Naly and provided supportive therapy.  Interventions:  BA  Diagnosis:    No diagnosis  found.  Psychiatric Treatment: Yes , Dr. Fredda Hammed.  See chart.   Treatment Plan:  Client Abilities/Strengths [name] is intelligent, well spoken, and motivated for change.   Support System: Friends from church.   Client Treatment Preferences OPT  Client Statement of Needs [name] would like to increase motivation and task, becoming more social and date, managing overall mood, processing past events, bolstering coping skills, managing overall symptoms, & increasing mindfulness.     Treatment Level Weekly  Symptoms  Depression:  Loss of interest, feeling down, poor sleep, lethargy, poor appetite, difficulty concentrating  (Status: maintained) Anxiety: feeling nervous, worrying about different things, trouble relaxing, irritability, feeling afraid as if something awful might happen, anxiety leaving the home.   (Status: maintained)  Goals:   Renate experiences symptoms of anxiety, depression, and PTSD.   Target Date: 10/23/23 Frequency: Weekly  Progress: 0 Modality: individual    Therapist will provide referrals for additional resources as appropriate.  Therapist will provide psycho-education regarding [name]'s diagnosis and corresponding treatment approaches and interventions. Licensed Clinical Social Worker, [place], [name] will support the patient's ability to achieve the goals identified. will employ CBT, BA, Problem-solving, Solution Focused, Mindfulness,  coping skills, & other evidenced-based practices will be used to promote progress towards healthy functioning to help manage decrease symptoms associated with her diagnosis.   Reduce overall level, frequency, and intensity of the feelings of depression & anxiety evidenced by decreased overall symptoms from 6 to 7 days/week to 0 to 1 days/week per client report for at least 3 consecutive months. Verbally express understanding of the  relationship between feelings of depression, anxiety and their impact on thinking patterns and  behaviors. Verbalize an understanding of the role that distorted thinking plays in creating fears, excessive worry, and ruminations.  [name] participated in the creation of the treatment plan)    [name], [name]

## 2023-05-21 ENCOUNTER — Ambulatory Visit (INDEPENDENT_AMBULATORY_CARE_PROVIDER_SITE_OTHER): Payer: PPO | Admitting: Psychology

## 2023-05-21 DIAGNOSIS — F4310 Post-traumatic stress disorder, unspecified: Secondary | ICD-10-CM | POA: Diagnosis not present

## 2023-05-21 DIAGNOSIS — F3341 Major depressive disorder, recurrent, in partial remission: Secondary | ICD-10-CM

## 2023-05-21 DIAGNOSIS — F411 Generalized anxiety disorder: Secondary | ICD-10-CM

## 2023-05-21 NOTE — Progress Notes (Signed)
[facility] Counselor/Therapist Progress Note  Patient ID: [name], MRN: [id number]    Date: 05/21/23  Time Spent: 1:00  pm 1:39 pm : 39  Minutes  Treatment Type: Individual Therapy.  Reported Symptoms: Depression and Anxiety.   Mental Status Exam: Appearance:  Casual     Behavior: Appropriate  Motor: Normal  Speech/Language:  Clear and Coherent  Affect: Appropriate  Mood: dysthymic  Thought process: normal  Thought content:   WNL  Sensory/Perceptual disturbances:   WNL  Orientation: oriented to person, place, time/date, and situation  Attention: Good  Concentration: Good  Memory: WNL  Fund of knowledge:  Good  Insight:   Good  Judgment:  Good  Impulse Control: Good   Risk Assessment: Danger to Self:  No Self-injurious Behavior: No Danger to Others: No Duty to Warn:no Physical Aggression / Violence:No  Access to Firearms a concern: No  Gang Involvement:No   Subjective:   [name] participated in the session from  the office with the therapist. [name] noted the events of the past week. She noted purchasing a shredder, which was a session goal, to begin working on managing her paperwork. She noted working on getting rid of things in the home in the meantime. Therapist praised [name] for her follow-up regarding her previous session goals. She noted that she and her father are maintaining consistent contact. She noted that her sleep has improved due to the resolution of interpersonal issues. She noted having a goal of attending a class at the senior center as a goal. She noted her efforts to manage "when is the other shoe going to drop". We worked on creating a plan to work on engaging in Forensic scientist, Interior and spatial designer, and identified the necessary tools and organizational approaches needed. We worked on identifying a schedule (~2 pm) for the behavior, use of reminders, and preparing for the task. She noted possible barriers to engagement  such as  missing her reminder on her phone. She noted her goal of ~15-30 minutes to begin engagement into the behavior. She noted being hopeful to have a larger support system. [name] was engaged and motivated during the session and expressed commitment towards our goals. Therapist praised [name] and provided supportive therapy.  Interventions:  BA  Diagnosis:    Major depressive disorder, recurrent episode, in partial remission (HCC)  GAD (generalized anxiety disorder)  Posttraumatic stress disorder  Psychiatric Treatment: Yes , Dr. Fredda Hammed.  See chart.   Treatment Plan:  Client Abilities/Strengths [name] is intelligent, well spoken, and motivated for change.   Support System: Friends from church.   Client Treatment Preferences OPT  Client Statement of Needs Jashiya would like to increase motivation and task, becoming more social and date, managing overall mood, processing past events, bolstering coping skills, managing overall symptoms, & increasing mindfulness.     Treatment Level Weekly  Symptoms  Depression:  Loss of interest, feeling down, poor sleep, lethargy, poor appetite, difficulty concentrating  (Status: maintained) Anxiety: feeling nervous, worrying about different things, trouble relaxing, irritability, feeling afraid as if something awful might happen, anxiety leaving the home.   (Status: maintained)  Goals:   [name] experiences symptoms of anxiety, depression, and PTSD.   Target Date: 10/23/23 Frequency: Weekly  Progress: 0 Modality: individual    Therapist will provide referrals for additional resources as appropriate.  Therapist will provide psycho-education regarding [name]'s diagnosis and corresponding treatment approaches and interventions. Licensed Clinical Social Worker, [place], [name] will support the patient's ability to achieve  the goals identified. will employ CBT, BA, Problem-solving, Solution Focused, Mindfulness,  coping skills, & other evidenced-based  practices will be used to promote progress towards healthy functioning to help manage decrease symptoms associated with her diagnosis.   Reduce overall level, frequency, and intensity of the feelings of depression & anxiety evidenced by decreased overall symptoms from 6 to 7 days/week to 0 to 1 days/week per client report for at least 3 consecutive months. Verbally express understanding of the relationship between feelings of depression, anxiety and their impact on thinking patterns and behaviors. Verbalize an understanding of the role that distorted thinking plays in creating fears, excessive worry, and ruminations.  [name] participated in the creation of the treatment plan)    [name], [name]

## 2023-05-22 ENCOUNTER — Other Ambulatory Visit (HOSPITAL_COMMUNITY): Payer: Self-pay | Admitting: Psychiatry

## 2023-05-22 DIAGNOSIS — F3341 Major depressive disorder, recurrent, in partial remission: Secondary | ICD-10-CM

## 2023-05-28 ENCOUNTER — Ambulatory Visit: Payer: PPO | Admitting: Obstetrics and Gynecology

## 2023-05-28 ENCOUNTER — Encounter: Payer: Self-pay | Admitting: Obstetrics and Gynecology

## 2023-05-28 VITALS — BP 104/64 | HR 54

## 2023-05-28 DIAGNOSIS — R232 Flushing: Secondary | ICD-10-CM | POA: Diagnosis not present

## 2023-05-28 DIAGNOSIS — N951 Menopausal and female climacteric states: Secondary | ICD-10-CM

## 2023-05-28 DIAGNOSIS — Z1231 Encounter for screening mammogram for malignant neoplasm of breast: Secondary | ICD-10-CM

## 2023-05-28 MED ORDER — CLIMARA PRO 0.045-0.015 MG/DAY TD PTWK: 1.0000 | MEDICATED_PATCH | TRANSDERMAL | 12 refills | Status: DC

## 2023-05-28 MED ORDER — ESTRADIOL 0.1 MG/GM VA CREA: 1.0000 | TOPICAL_CREAM | Freq: Every day | VAGINAL | 12 refills | Status: DC
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
## 2023-05-29 ENCOUNTER — Encounter: Payer: Self-pay | Admitting: Obstetrics and Gynecology

## 2023-05-29 NOTE — Telephone Encounter (Signed)
Routing to Dr. Karma Greaser to provide alternative.

## 2023-06-04 ENCOUNTER — Ambulatory Visit (INDEPENDENT_AMBULATORY_CARE_PROVIDER_SITE_OTHER): Payer: PPO | Admitting: Psychology

## 2023-06-04 DIAGNOSIS — F411 Generalized anxiety disorder: Secondary | ICD-10-CM

## 2023-06-04 DIAGNOSIS — F3341 Major depressive disorder, recurrent, in partial remission: Secondary | ICD-10-CM | POA: Diagnosis not present

## 2023-06-04 NOTE — Progress Notes (Signed)
[facility] Counselor/Therapist Progress Note  Patient ID: [name], MRN: [id number]    Date: 06/04/23  Time Spent: 12:59  pm -  1:58  pm : 59 Minutes  Treatment Type: Individual Therapy.  Reported Symptoms: Depression and Anxiety.   Mental Status Exam: Appearance:  Casual     Behavior: Appropriate  Motor: Normal  Speech/Language:  Clear and Coherent  Affect: Appropriate  Mood: dysthymic  Thought process: normal  Thought content:   WNL  Sensory/Perceptual disturbances:   WNL  Orientation: oriented to person, place, time/date, and situation  Attention: Good  Concentration: Good  Memory: WNL  Fund of knowledge:  Good  Insight:   Good  Judgment:  Good  Impulse Control: Good   Risk Assessment: Danger to Self:  No Self-injurious Behavior: No Danger to Others: No Duty to Warn:no Physical Aggression / Violence:No  Access to Firearms a concern: No  Gang Involvement:No   Subjective:   [name] participated in the session from  the office with the therapist. [name] noted the events of the past week. [name] noted starting the task of organizing her mail. She noted finding misplaced items as a result of her search. She noted this feels "really good". She noted her effort to continue her task to work through this incrementally. She noted working on this one hour every night and nothing this being manageable. Therapist praised [name] for her follow-up on her goals. She noted difficulty paying for an expensive name-brand medication that was high in price. We worked on identifying possible resources. She noted continues to grieve for her boyfriend who passed in 2022 and a neighbor who passed recently. Therapist validated [name]'s feelings and experience. [name] noted her interest in participating in an exercise class at the senior center but noted this being difficult to engage in. We began exploring this during the session. Therapist encouraged additional introspection  regarding this between sessions. [name] was engaged and motivated during the session. She expressed commitment towards our goals. Therapist praised [name] and provided supportive therapy. A follow-up was scheduled for continued treatment.   Interventions:  BA & CBT  Diagnosis:    Major depressive disorder, recurrent episode, in partial remission (HCC)  GAD (generalized anxiety disorder)  Psychiatric Treatment: Yes , Dr. Fredda Hammed.  See chart.   Treatment Plan:  Client Abilities/Strengths [name] is intelligent, well spoken, and motivated for change.   Support System: Friends from church.   Client Treatment Preferences OPT  Client Statement of Needs [name] would like to increase motivation and task, becoming more social and date, managing overall mood, processing past events, bolstering coping skills, managing overall symptoms, & increasing mindfulness.     Treatment Level Weekly  Symptoms  Depression:  Loss of interest, feeling down, poor sleep, lethargy, poor appetite, difficulty concentrating  (Status: maintained) Anxiety: feeling nervous, worrying about different things, trouble relaxing, irritability, feeling afraid as if something awful might happen, anxiety leaving the home.   (Status: maintained)  Goals:   [name] experiences symptoms of anxiety, depression, and PTSD.   Target Date: 10/23/23 Frequency: Weekly  Progress: 0 Modality: individual    Therapist will provide referrals for additional resources as appropriate.  Therapist will provide psycho-education regarding Delpha's diagnosis and corresponding treatment approaches and interventions. Licensed Clinical Social Worker, [place], [name] will support the patient's ability to achieve the goals identified. will employ CBT, BA, Problem-solving, Solution Focused, Mindfulness,  coping skills, & other evidenced-based practices will be used to promote progress towards healthy functioning  to help manage decrease symptoms associated  with her diagnosis.   Reduce overall level, frequency, and intensity of the feelings of depression & anxiety evidenced by decreased overall symptoms from 6 to 7 days/week to 0 to 1 days/week per client report for at least 3 consecutive months. Verbally express understanding of the relationship between feelings of depression, anxiety and their impact on thinking patterns and behaviors. Verbalize an understanding of the role that distorted thinking plays in creating fears, excessive worry, and ruminations.  [name] participated in the creation of the treatment plan)    [name], [name]

## 2023-06-05 NOTE — Telephone Encounter (Signed)
Let's go ahead with the generic, if the cost is okay. Dr. Edward Jolly is a good resource on HRT, so would be beneficial to do a consult.  Wait a couple weeks after starting to see her. Dr. Karma Greaser

## 2023-06-18 ENCOUNTER — Ambulatory Visit: Payer: PPO | Admitting: Psychology

## 2023-06-18 DIAGNOSIS — F3341 Major depressive disorder, recurrent, in partial remission: Secondary | ICD-10-CM | POA: Diagnosis not present

## 2023-06-18 DIAGNOSIS — F411 Generalized anxiety disorder: Secondary | ICD-10-CM | POA: Diagnosis not present

## 2023-06-18 DIAGNOSIS — F4310 Post-traumatic stress disorder, unspecified: Secondary | ICD-10-CM

## 2023-06-18 NOTE — Progress Notes (Signed)
[facility] Counselor/Therapist Progress Note  Patient ID: [name], MRN: [id number]    Date: 06/18/23  Time Spent: 1:03 pm -  1:58 pm : 55 Minutes  Treatment Type: Individual Therapy.  Reported Symptoms: Depression and Anxiety.   Mental Status Exam: Appearance:  Casual     Behavior: Appropriate  Motor: Normal  Speech/Language:  Clear and Coherent  Affect: Appropriate  Mood: dysthymic  Thought process: normal  Thought content:   WNL  Sensory/Perceptual disturbances:   WNL  Orientation: oriented to person, place, time/date, and situation  Attention: Good  Concentration: Good  Memory: WNL  Fund of knowledge:  Good  Insight:   Good  Judgment:  Good  Impulse Control: Good   Risk Assessment: Danger to Self:  No Self-injurious Behavior: No Danger to Others: No Duty to Warn:no Physical Aggression / Violence:No  Access to Firearms a concern: No  Gang Involvement:No   Subjective:   NATILIE KRABBENHOFT participated in the session from  the office, and consented totreatment with the therapist. Anali noted the events of the past week. Migdalia noted maintaining some of the routine of staying organized with incoming mail but noted "losing steam" in regards to mail that is already in the home and getting organized. She noted her plans to ship out many extraneous belongings and discussed the barriers to this including scheduling or feeling "overwhelmed with other stuff", and getting various electronics to interface.  We worked problem-solving these barriers during the session. She noted the stress of world politics specifically in the [place] and noted having family in [place]. We worked on processing this during the session. She is hopeful to attend an [place] service prior to December. Therapist praised Mya for her effort to be more social and Heavenlee noted that in order to be social she has to engage socially.  She provided additional background regarding her career and  financial pressures. She noted living in [place] for sometime and noted this being quite triggering for her PTSD. We worked on processing this during the session. Therapist praised Tanashia for her effort during the session and provided supportive therapy. A follow-up was scheduled for continued treatment.   Interventions:  BA & CBT  Diagnosis:    Major depressive disorder, recurrent episode, in partial remission (HCC)  GAD (generalized anxiety disorder)  Posttraumatic stress disorder  Psychiatric Treatment: Yes , Dr. Fredda Hammed.  See chart.   Treatment Plan:  Client Abilities/Strengths Chamia is intelligent, well spoken, and motivated for change.   Support System: Friends from church.   Client Treatment Preferences OPT  Client Statement of Needs Norberta would like to increase motivation and task, becoming more social and date, managing overall mood, processing past events, bolstering coping skills, managing overall symptoms, & increasing mindfulness.     Treatment Level Weekly  Symptoms  Depression:  Loss of interest, feeling down, poor sleep, lethargy, poor appetite, difficulty concentrating  (Status: maintained) Anxiety: feeling nervous, worrying about different things, trouble relaxing, irritability, feeling afraid as if something awful might happen, anxiety leaving the home.   (Status: maintained)  Goals:   Zyann experiences symptoms of anxiety, depression, and PTSD.   Target Date: 10/23/23 Frequency: Weekly  Progress: 0 Modality: individual    Therapist will provide referrals for additional resources as appropriate.  Therapist will provide psycho-education regarding Tamsin's diagnosis and corresponding treatment approaches and interventions. Licensed Clinical Social Worker, [place], [name] will support the patient's ability to achieve the goals identified. will employ CBT, BA, Problem-solving,  Solution Focused, Mindfulness,  coping skills, & other evidenced-based practices will be  used to promote progress towards healthy functioning to help manage decrease symptoms associated with her diagnosis.   Reduce overall level, frequency, and intensity of the feelings of depression & anxiety evidenced by decreased overall symptoms from 6 to 7 days/week to 0 to 1 days/week per client report for at least 3 consecutive months. Verbally express understanding of the relationship between feelings of depression, anxiety and their impact on thinking patterns and behaviors. Verbalize an understanding of the role that distorted thinking plays in creating fears, excessive worry, and ruminations.  [name] participated in the creation of the treatment plan)    [name], [name]

## 2023-06-19 ENCOUNTER — Ambulatory Visit: Payer: PPO | Admitting: Obstetrics and Gynecology

## 2023-06-25 ENCOUNTER — Encounter (HOSPITAL_COMMUNITY): Payer: Self-pay | Admitting: Psychiatry

## 2023-06-25 ENCOUNTER — Ambulatory Visit (INDEPENDENT_AMBULATORY_CARE_PROVIDER_SITE_OTHER): Payer: PPO | Admitting: Psychiatry

## 2023-06-25 VITALS — BP 134/72 | HR 58 | Ht 60.0 in | Wt 168.0 lb

## 2023-06-25 DIAGNOSIS — F411 Generalized anxiety disorder: Secondary | ICD-10-CM

## 2023-06-25 DIAGNOSIS — F4310 Post-traumatic stress disorder, unspecified: Secondary | ICD-10-CM | POA: Diagnosis not present

## 2023-06-25 DIAGNOSIS — F3341 Major depressive disorder, recurrent, in partial remission: Secondary | ICD-10-CM

## 2023-06-25 MED ORDER — BUPROPION HCL ER (SR) 100 MG PO TB12: 100.0000 mg | ORAL_TABLET | Freq: Two times a day (BID) | ORAL | 1 refills | Status: DC

## 2023-06-25 NOTE — Progress Notes (Signed)
Patient ID: [name], female   DOB: 10/30/1965, 57 y.o.   MRN: [id number]   [facility] Health Follow-up Outpatient Visit  MARG MACMASTER 08-29-1966 [id number] 57 y.o.  06/25/2023 10:36 AM     HPI Comments: [name] is  a 57  y/o female with a past psychiatric history significant for Post traumatic stress Disorder, Major Depressive Disorder, recurrent, severe and GAD.   Doing fair, mom died , she keeps in touch with her dad in [place]  Overall doing  fair, has roomates who pay Has history of seasonal affective disorder so we talked of wellbutrin options    She mostly stays at home and likes it that way  Helped her dad in [place] after mom death Is now in therapy for ptsd   Learning diabetes control  Modifying factor: book reading Aggravating factor : family concerns in the past Severity fair   Past Medical Family, Social History:  Past Medical History:  Diagnosis Date   Abnormal Papanicolaou smear of cervix with positive human papilloma virus (HPV) test 02/2017   Anxiety    Asthma    Depression    Diabetes mellitus type II    Food intolerance    Headache    High blood pressure    IBS (irritable bowel syndrome)    Insomnia    Migraines    PCOS (polycystic ovarian syndrome)    PTSD (post-traumatic stress disorder)    Seasonal allergies    Thyroid disease    Family History  Problem Relation Age of Onset   Bipolar disorder Mother    Hypertension Mother    Diabetes Mother    Alzheimer's disease Father    Hypertension Father    Alzheimer's disease Maternal Grandmother    Heart disease Paternal Grandfather    Social History   Socioeconomic History   Marital status: Single    Spouse name: Not on file   Number of children: 0   Years of education: 18   Highest education level: Master's degree (e.g., MA, MS, MEng, MEd, MSW, MBA)  Occupational History   Occupation: Long Term Disability    Comment: PTSD  Tobacco Use   Smoking status: Never    Smokeless tobacco: Never  Vaping Use   Vaping status: Never Used  Substance and Sexual Activity   Alcohol use: Yes    Alcohol/week: 1.0 standard drink of alcohol    Types: 1 Standard drinks or equivalent per week    Comment: 1 drink a month   Drug use: No   Sexual activity: Not Currently    Partners: Male    Birth control/protection: Abstinence    Comment: 1st intercourse- 16, partners more than 5  Other Topics Concern   Not on file  Social History Narrative   Regular exercise: yes, walk   Caffeine use: occasionally tea   Fun: Social group events, walking her dogs, movies   Denies abuse and feels safe where she lives.    Social Determinants of Health   Financial Resource Strain: Low Risk  (01/08/2022)   Overall Financial Resource Strain (CARDIA)    Difficulty of Paying Living Expenses: Not hard at all  Food Insecurity: No Food Insecurity (01/08/2022)   Hunger Vital Sign    Worried About Running Out of Food in the Last Year: Never true    Ran Out of Food in the Last Year: Never true  Transportation Needs: No Transportation Needs (01/08/2022)   PRAPARE - Transportation    Lack of Transportation (  Medical): No    Lack of Transportation (Non-Medical): No  Physical Activity: Inactive (01/08/2022)   Exercise Vital Sign    Days of Exercise per Week: 0 days    Minutes of Exercise per Session: 0 min  Stress: No Stress Concern Present (01/08/2022)   [employer] of Occupational Health - Occupational Stress Questionnaire    Feeling of Stress : Not at all  Social Connections: Socially Isolated (01/08/2022)   Social Connection and Isolation Panel [NHANES]    Frequency of Communication with Friends and Family: Twice a week    Frequency of Social Gatherings with Friends and Family: More than three times a week    Attends Religious Services: Never    Database administrator or Organizations: No    Attends Banker Meetings: Never    Marital Status: Never married  Intimate  Partner Violence: Not At Risk (01/08/2022)   Humiliation, Afraid, Rape, and Kick questionnaire    Fear of Current or Ex-Partner: No    Emotionally Abused: No    Physically Abused: No    Sexually Abused: No    Outpatient Encounter Medications as of 06/25/2023  Medication Sig   albuterol (VENTOLIN HFA) 108 (90 Base) MCG/ACT inhaler INHALE 2 PUFFS BY MOUTH EVERY 4 TO 6 HOURS AS DIRECTED(RESCUE)   Alpha-Lipoic Acid 600 MG CAPS Take 300 mg by mouth 2 (two) times daily.    atenolol (TENORMIN) 100 MG tablet TAKE 1 TABLET(100 MG) BY MOUTH TWICE DAILY   Blood Glucose Monitoring Suppl (ONE TOUCH ULTRA MINI) w/Device KIT Use to check blood sugar 2 times a day.   Blood Glucose Monitoring Suppl (ONETOUCH VERIO) w/Device KIT Use as instructed to check blood sugar 4X daily   buPROPion ER (WELLBUTRIN SR) 100 MG 12 hr tablet Take 1 tablet (100 mg total) by mouth 2 (two) times daily.   clobetasol cream (TEMOVATE) 0.05 % Apply 1 application topically 2 (two) times daily.   clonazePAM (KLONOPIN) 0.5 MG tablet TAKE 1/2 TABLET BY MOUTH DAILY (Patient taking differently: TAKE 1/4 TABLET BY MOUTH DAILY)   Continuous Blood Gluc Sensor (FREESTYLE LIBRE 14 DAY SENSOR) MISC Use for continuous glucose monitoring.   Digestive Enzymes (ENZYME DIGEST PO) Take by mouth.   estradiol (ESTRACE VAGINAL) 0.1 MG/GM vaginal cream Place 1 Applicatorful vaginally at bedtime. Use 3 times a week   estradiol-levonorgestrel (CLIMARA PRO) 0.045-0.015 MG/DAY Place 1 patch onto the skin once a week.   fexofenadine (ALLEGRA) 180 MG tablet Take 180 mg by mouth daily.   fluvastatin XL (LESCOL XL) 80 MG 24 hr tablet TAKE 1 TABLET(80 MG) BY MOUTH DAILY   glucose blood (ONETOUCH ULTRA) test strip Use to check blood sugar once a day.   glucose blood (ONETOUCH VERIO) test strip Use as instructed to check blood sugar 4X daily   Insulin Aspart FlexPen (NOVOLOG) 100 UNIT/ML ADMINISTER 10 TO 30 UNITS UNDER THE SKIN THREE TIMES DAILY BEFORE MEALS    Insulin Pen Needle (BD PEN NEEDLE NANO 2ND GEN) 32G X 4 MM MISC USE 4 TIMES DAILY   irbesartan (AVAPRO) 75 MG tablet TAKE 1 TABLET(75 MG) BY MOUTH DAILY   LANTUS SOLOSTAR 100 UNIT/ML Solostar Pen Inject 20 Units into the skin daily.   levothyroxine (SYNTHROID) 100 MCG tablet TAKE 1 TABLET(100 MCG) BY MOUTH DAILY   methocarbamol (ROBAXIN) 500 MG tablet TAKE 1 TABLET(500 MG) BY MOUTH EVERY 8 HOURS AS NEEDED FOR MUSCLE SPASMS   NOVOLOG FLEXPEN 100 UNIT/ML FlexPen Inject 10-30 Units into the  skin 3 (three) times daily with meals.   Olopatadine HCl (PATADAY OP) Apply to eye.   OVER THE COUNTER MEDICATION 1 capsule daily. Probiotic renew life    sertraline (ZOLOFT) 100 MG tablet TAKE 2 TABLETS BY MOUTH EVERY DAY   [DISCONTINUED] EFFEXOR XR 150 MG 24 hr capsule 375 mg daily.    No facility-administered encounter medications on file as of 06/25/2023.     Physical Exam: Constitutional:  BP 134/72   Pulse (!) 58   Ht 5' (1.524 m)   Wt 168 lb (76.2 kg)   BMI 32.81 kg/m   Review of Systems  Cardiovascular:  Negative for chest pain.  Psychiatric/Behavioral:  Negative for depression, substance abuse and suicidal ideas.        Psychiatric Specialty exam: General Appearance:   Eye Contact::    Speech:  Clear and Coherent and Normal Rate  Volume:  Normal  Mood: fair  Affect:    Thought Process:  Coherent, Linear and Logical  Orientation:  Full (Time, Place, and Person)  Thought Content:  WDL  Suicidal Thoughts:  No  Homicidal Thoughts:  No  Memory:  Immediate;   Good Recent;   Good Remote;   Good  Judgement:  Fair  Insight:  Fair  Psychomotor Activity:  Normal  Concentration:  Good  Recall:  Good  Akathisia:  Negative  Language-Intact  Fund of knowledge-Average  Handed:  Right  AIMS (if indicated):     Assets:  Desire for Improvement Housing  Sleep:  Number of Hours: 1-9 hours     Assessment: Axis I: Post traumatic stress Disorder-Major Depressive Disorder, recurrent,  severe- GAd            Prior documentation reviewed   1. Depression: maangeable continue zoloft Seasonal affective disorder: will start wellbutrin nov 1. Small dose of 100mg , call back for concerns     2. PTSD: working on triggers continue zoloft  3. GAD:  manageable continue zoloft        Direct care time 20 minutes including face to face, documentation     Royann Shivers, M.D.  06/25/2023 10:36 AM

## 2023-07-02 ENCOUNTER — Ambulatory Visit (INDEPENDENT_AMBULATORY_CARE_PROVIDER_SITE_OTHER): Payer: PPO | Admitting: Psychology

## 2023-07-02 DIAGNOSIS — F3341 Major depressive disorder, recurrent, in partial remission: Secondary | ICD-10-CM

## 2023-07-02 DIAGNOSIS — F411 Generalized anxiety disorder: Secondary | ICD-10-CM | POA: Diagnosis not present

## 2023-07-02 DIAGNOSIS — F4310 Post-traumatic stress disorder, unspecified: Secondary | ICD-10-CM | POA: Diagnosis not present
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
## 2023-07-04 ENCOUNTER — Ambulatory Visit: Payer: PPO | Admitting: Obstetrics and Gynecology

## 2023-07-08 ENCOUNTER — Encounter: Payer: Self-pay | Admitting: Internal Medicine

## 2023-07-08 DIAGNOSIS — E113292 Type 2 diabetes mellitus with mild nonproliferative diabetic retinopathy without macular edema, left eye: Secondary | ICD-10-CM | POA: Diagnosis not present

## 2023-07-08 DIAGNOSIS — H2513 Age-related nuclear cataract, bilateral: Secondary | ICD-10-CM | POA: Diagnosis not present

## 2023-07-08 DIAGNOSIS — H5213 Myopia, bilateral: Secondary | ICD-10-CM | POA: Diagnosis not present

## 2023-07-08 LAB — HM DIABETES EYE EXAM

## 2023-07-15 ENCOUNTER — Other Ambulatory Visit: Payer: Self-pay

## 2023-07-15 DIAGNOSIS — E1165 Type 2 diabetes mellitus with hyperglycemia: Secondary | ICD-10-CM

## 2023-07-15 MED ORDER — FREESTYLE LIBRE 14 DAY SENSOR MISC: 3 refills | Status: DC

## 2023-07-15 MED ORDER — LEVOTHYROXINE SODIUM 100 MCG PO TABS: ORAL_TABLET | ORAL | 3 refills | Status: DC

## 2023-07-15 NOTE — Telephone Encounter (Signed)
Requested Prescriptions   Signed Prescriptions Disp Refills   Continuous Glucose Sensor (FREESTYLE LIBRE 14 DAY SENSOR) MISC 6 each 3    Sig: Use for continuous glucose monitoring.    Authorizing Provider: Carlus Pavlov    Ordering User: Pollie Meyer   levothyroxine (SYNTHROID) 100 MCG tablet 90 tablet 3    Sig: TAKE 1 TABLET(100 MCG) BY MOUTH DAILY    Authorizing Provider: Carlus Pavlov    Ordering User: Pollie Meyer

## 2023-07-16 ENCOUNTER — Other Ambulatory Visit: Payer: Self-pay

## 2023-07-16 ENCOUNTER — Ambulatory Visit (INDEPENDENT_AMBULATORY_CARE_PROVIDER_SITE_OTHER): Payer: PPO | Admitting: Psychology

## 2023-07-16 DIAGNOSIS — F3341 Major depressive disorder, recurrent, in partial remission: Secondary | ICD-10-CM

## 2023-07-16 DIAGNOSIS — F4310 Post-traumatic stress disorder, unspecified: Secondary | ICD-10-CM | POA: Diagnosis not present

## 2023-07-16 DIAGNOSIS — F411 Generalized anxiety disorder: Secondary | ICD-10-CM

## 2023-07-16 MED ORDER — FREESTYLE LIBRE 2 READER DEVI: 1.0000 | Freq: Every day | 0 refills | Status: DC

## 2023-07-16 NOTE — Telephone Encounter (Signed)
Requested Prescriptions   Signed Prescriptions Disp Refills   Continuous Glucose Receiver (FREESTYLE LIBRE 2 READER) DEVI 1 each 0    Sig: 1 each by Does not apply route daily.    Authorizing Provider: Carlus Pavlov    Ordering User: Pollie Meyer

## 2023-07-16 NOTE — Progress Notes (Signed)
[facility] Counselor/Therapist Progress Note  Patient ID: [name], MRN: [id number]    Date: 07/16/23  Time Spent: 1:02 pm -  1:46 pm : 44 Minutes  Treatment Type: Individual Therapy.  Reported Symptoms: Depression and Anxiety.   Mental Status Exam: Appearance:  Casual     Behavior: Appropriate  Motor: Normal  Speech/Language:  Clear and Coherent  Affect: Appropriate  Mood: dysthymic  Thought process: normal  Thought content:   WNL  Sensory/Perceptual disturbances:   WNL  Orientation: oriented to person, place, time/date, and situation  Attention: Good  Concentration: Good  Memory: WNL  Fund of knowledge:  Good  Insight:   Good  Judgment:  Good  Impulse Control: Good   Risk Assessment: Danger to Self:  No Self-injurious Behavior: No Danger to Others: No Duty to Warn:no Physical Aggression / Violence:No  Access to Firearms a concern: No  Gang Involvement:No   Subjective:   [name] participated in the session from  the office, and consented totreatment with the therapist. [name] noted the events of the past week. [name] noted feeling energetic and discussed getting tasks done around the house specifically noting the dishes. She noted having to discussed boundaries with a roommate who is not being respectful of a concern that [name] has previously discussed. Therapist praised [name] for her effort in communicating her needs and boundaries. She noted difficulty engaging in a previous goal of managing her mail and shredding old mail. She noted often doing better with structure. We worked on identifying ways to structure this goal int o her schedule. She could not identify any barriers to engagement. [name] defined "structure" as a set schedule. We employed principles of behavioral activation to create a plan including a schedule, addressing barriers, preparing ahead of time, and problem-solving. She noted 11 pm on Wednesday beginning at 11 am for one house. She  noted being mostly prepared with the exception of taking the shredder out of the box. She noted needing a trash bag for the clipping. We worked on identifying ways to make the experience more enjoyable including listening to music. We discussed the importance of preparation prior to the task. She noted a need to set an alarm on her phone and did so during the session. Therapist praised [name] for her proactive thinking during the session. Therapist validated [name]'s feelings and experience and provided supportive therapy. A follow-up was scheduled for continued treatment.   Interventions:  BA & CBT  Diagnosis:    Major depressive disorder, recurrent episode, in partial remission (HCC)  GAD (generalized anxiety disorder)  Posttraumatic stress disorder  Psychiatric Treatment: Yes , Dr. Fredda Hammed.  See chart.   Treatment Plan:  Client Abilities/Strengths [name] is intelligent, well spoken, and motivated for change.   Support System: Friends from church.   Client Treatment Preferences OPT  Client Statement of Needs [name] would like to increase motivation and task, becoming more social and date, managing overall mood, processing past events, bolstering coping skills, managing overall symptoms, & increasing mindfulness.     Treatment Level Weekly  Symptoms  Depression:  Loss of interest, feeling down, poor sleep, lethargy, poor appetite, difficulty concentrating  (Status: maintained) Anxiety: feeling nervous, worrying about different things, trouble relaxing, irritability, feeling afraid as if something awful might happen, anxiety leaving the home.   (Status: maintained)  Goals:   [name] experiences symptoms of anxiety, depression, and PTSD.   Target Date: 10/23/23 Frequency: Weekly  Progress: 0 Modality: individual    Therapist  will provide referrals for additional resources as appropriate.  Therapist will provide psycho-education regarding [name]'s diagnosis and corresponding treatment  approaches and interventions. Licensed Clinical Social Worker, [place], [name] will support the patient's ability to achieve the goals identified. will employ CBT, BA, Problem-solving, Solution Focused, Mindfulness,  coping skills, & other evidenced-based practices will be used to promote progress towards healthy functioning to help manage decrease symptoms associated with her diagnosis.   Reduce overall level, frequency, and intensity of the feelings of depression & anxiety evidenced by decreased overall symptoms from 6 to 7 days/week to 0 to 1 days/week per client report for at least 3 consecutive months. Verbally express understanding of the relationship between feelings of depression, anxiety and their impact on thinking patterns and behaviors. Verbalize an understanding of the role that distorted thinking plays in creating fears, excessive worry, and ruminations.  [name] participated in the creation of the treatment plan)    [name], [name]

## 2023-07-26 ENCOUNTER — Ambulatory Visit (INDEPENDENT_AMBULATORY_CARE_PROVIDER_SITE_OTHER): Payer: PPO | Admitting: Internal Medicine

## 2023-07-26 ENCOUNTER — Encounter: Payer: Self-pay | Admitting: Internal Medicine

## 2023-07-26 VITALS — BP 120/68 | HR 53 | Resp 20 | Ht 60.0 in | Wt 175.0 lb

## 2023-07-26 DIAGNOSIS — E039 Hypothyroidism, unspecified: Secondary | ICD-10-CM

## 2023-07-26 DIAGNOSIS — Z794 Long term (current) use of insulin: Secondary | ICD-10-CM | POA: Diagnosis not present

## 2023-07-26 DIAGNOSIS — E1165 Type 2 diabetes mellitus with hyperglycemia: Secondary | ICD-10-CM

## 2023-07-26 DIAGNOSIS — E782 Mixed hyperlipidemia: Secondary | ICD-10-CM | POA: Diagnosis not present

## 2023-07-26 LAB — LIPID PANEL
Cholesterol: 260 mg/dL — ABNORMAL HIGH (ref 0–200)
HDL: 82.1 mg/dL (ref >39.00)
LDL Cholesterol: 160 mg/dL — ABNORMAL HIGH (ref 0–99)
NonHDL: 178.25
Total CHOL/HDL Ratio: 3
Triglycerides: 92 mg/dL (ref 0.0–149.0)
VLDL: 18.4 mg/dL (ref 0.0–40.0)

## 2023-07-26 LAB — POCT GLYCOSYLATED HEMOGLOBIN (HGB A1C): Hemoglobin A1C: 6.6 % — AB (ref 4.0–5.6)

## 2023-07-26 LAB — COMPREHENSIVE METABOLIC PANEL
ALT: 20 U/L (ref 0–35)
AST: 20 U/L (ref 0–37)
Albumin: 4.2 g/dL (ref 3.5–5.2)
Alkaline Phosphatase: 79 U/L (ref 39–117)
BUN: 24 mg/dL — ABNORMAL HIGH (ref 6–23)
CO2: 28 meq/L (ref 19–32)
Calcium: 9.7 mg/dL (ref 8.4–10.5)
Chloride: 101 meq/L (ref 96–112)
Creatinine, Ser: 1.01 mg/dL (ref 0.40–1.20)
GFR: 62.62 mL/min (ref >60.00)
Glucose, Bld: 144 mg/dL — ABNORMAL HIGH (ref 70–99)
Potassium: 4.3 meq/L (ref 3.5–5.1)
Sodium: 137 meq/L (ref 135–145)
Total Bilirubin: 0.4 mg/dL (ref 0.2–1.2)
Total Protein: 7.9 g/dL (ref 6.0–8.3)

## 2023-07-26 LAB — MICROALBUMIN / CREATININE URINE RATIO
Creatinine,U: 97 mg/dL
Microalb Creat Ratio: 9.4 mg/g (ref 0.0–30.0)
Microalb, Ur: 9.1 mg/dL — ABNORMAL HIGH (ref 0.0–1.9)

## 2023-07-26 LAB — T4, FREE: Free T4: 0.8 ng/dL (ref 0.60–1.60)

## 2023-07-26 LAB — TSH: TSH: 6.85 u[IU]/mL — ABNORMAL HIGH (ref 0.35–5.50)

## 2023-07-26 NOTE — Progress Notes (Signed)
Patient ID: [name], female   DOB: 01-29-1966, 57 y.o.   MRN: [id number]  HPI: [name] is a 57 y.o.-year-old female, returning for f/u for DM2, dx 2013, insulin-dependent, uncontrolled, with complications (diabetic retinopathy) and also hypothyroidism. Last visit 4 months ago.  Interim history: No increased urination, blurry vision, chest pain.  She has increased sugar cravings.  She developed psoriasis -skin, scalp. She started fish oil since last visit.  She feels much better on this.  DM2: Reviewed HbA1c levels: Lab Results  Component Value Date   HGBA1C 6.5 03/19/2023   HGBA1C 6.1 (A) 11/02/2022   HGBA1C 7.2 (A) 06/27/2022   Pt is on a regimen of: - Lantus 12 >> ... 20 >> 25 >> 20 >> 20 units at bedtime >> 24 >> 20 units in am - NovoLog 15-20 units 15 minutes before each meal, 24-26 >> 10-12 units for protein shake; 16-17 units before a regular meal; 22-25 for a high carb meal We stopped Cycloset in 06/2020. She did not start Welchol 2 tabs 2x a day - added 03/2017 - too expensive On Turmeric, alpha-lipoic acid + 660 mcg biotin per day, Garlicin. We stopped Glipizide when we started NovoLog.  She has various intolerances to different diabetic medicines: Metformin ER >> in the past, she had gastric discomfort and severe diarrhea (loss of bowel control) Januvia >> tried for 1-2 months >> left chest pain She was wondering if she could take Invokana, however she had multiple vaginal yeast infections. She Also had nocturia 3-4 x a night.  She had reactive hypoglycemia symptoms while on Quercetin (was taking it for allergies)  She stopped Rybelsus 7 mg >> stopped 2/2 N and D   She checks her sugars more than 4 times a day with her libre CGM:  Previously:  Previously:   Lowest: LO x2 >> .Marland Kitchen. 100 >> 40 >> 50 >> 63 (at night). Highest: 455 >> ...  >250 >> 200 >> 350 >> 300s.  She has many food intolerances: - Gluten >> cannot eat bread or pastry.  - raw vegetables,  beans, cruciferous vegetables, fruit >> diarrhea.  - No dairy but, cereals  -at night -now off ice cream In the past, no she was taking up to 36 units of NovoLog for ice cream.  I strongly advised her to stop this in the past. She does not digest fat well after her cholecystectomy in 2011.   Meter: One Touch Mini  No CKD, last BUN/creatinine was:  Lab Results  Component Value Date   BUN 15 03/26/2022   CREATININE 0.71 03/26/2022   Lab Results  Component Value Date   MICRALBCREAT 6.5 02/22/2022   MICRALBCREAT 5.2 03/22/2020   MICRALBCREAT 8.6 03/31/2019   MICRALBCREAT 1.1 08/26/2015  On irbesartan.  + HL: Lab Results  Component Value Date   CHOL 346 (H) 02/22/2022   HDL 130.70 02/22/2022   LDLCALC 191 (H) 02/22/2022   LDLDIRECT 205.0 01/24/2016   TRIG 121.0 02/22/2022   CHOLHDL 3 02/22/2022  Previous cholesterol levels were: 325/197/132/154.   She could not tolerate pravastatin or Livalo due to muscle aches.  She refused a referral to lipid clinic in the past for PCSK9 inhibitors.  On fluvastatin XL + CoQ10. On Fish oil now.  I suggested Zetia but she refused due to the possibility of developing diarrhea on it.  - last dilated eye exam was on 07/08/2023: + DR. Dr. Marvis Russell.   - + Numbness and tingling in her legs.  She is on the R enantiomer of alpha-lipoic acid.  Last foot exam 11/02/2022.  She also has a history of PCOS- she has seen Dr. Rosine Russell at [facility] in the past - note from 08/26/2012: perimenopause, and at that time she was on Yasmin, subsequently changed to Necon. She sees Dr. Marvel Russell with OB/GYN. She has PTSD, MDD, insomnia, anxiety, asthma, HTN, GERD, status post cholecystectomy, anemia, transaminitis. She also had increased uterine bleeding (on Necon). She sawan acupuncturist. She takes Nettle powder >> helped her with diarrhea, anemia, insomnia. 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 Also, Undecylenic acid and  mastic gum, no carbs x 1-2 months.  She has IBS >> better on L- plantarum, S.bulardii - Brevibacillus Laterosporus 20 min before b'fast.   Hypothyroidism  -Diagnosed in 02/2018. We started levothyroxine 03/2018.  Pt is on levothyroxine 100 mcg daily, dose increased at last visit, taken: - in am - fasting - at least 30 min from b'fast - + calcium in butyrate later in the day - no iron - no multivitamins - no PPIs - not on Biotin On Kelp - 1 capsule a day.  Reviewed her TFTs: Lab Results  Component Value Date   TSH 1.78 09/27/2022   TSH 6.06 (H) 06/27/2022   TSH 8.33 (H) 02/22/2022   TSH 2.02 02/06/2021   TSH 2.82 07/01/2020   TSH 6.44 (H) 03/22/2020   TSH 2.96 04/01/2019   TSH 1.26 07/30/2018   TSH 10.64 (H) 04/04/2018   TSH 4.80 (H) 03/13/2018   FREET4 0.88 06/27/2022   FREET4 0.91 02/22/2022   FREET4 0.63 02/06/2021   FREET4 0.78 07/01/2020   FREET4 0.91 03/22/2020   FREET4 0.60 04/01/2019   FREET4 0.84 07/30/2018   FREET4 0.72 04/04/2018   T3FREE 2.6 04/04/2018   Her antithyroid antibodies were not elevated: Component     Latest Ref Rng & Units 04/04/2018  Thyroglobulin Ab     < or = 1 IU/mL <1  Thyroperoxidase Ab SerPl-aCnc     <9 IU/mL 2   No FH of thyroid cancer. No h/o radiation tx to head or neck. No Biotin use. No recent steroids use.   Pt denies: - feeling nodules in neck - hoarseness - dysphagia - choking  She takes vitamin D 5000 units.  ROS: + See HPI  I reviewed pt's medications, allergies, PMH, social hx, family hx, and changes were documented in the history of present illness. Otherwise, unchanged from my initial visit note.  Past Medical History:  Diagnosis Date   Abnormal Papanicolaou smear of cervix with positive human papilloma virus (HPV) test 02/2017   Anxiety    Asthma    Depression    Diabetes mellitus type II    Food intolerance    Headache    High blood pressure    IBS (irritable bowel syndrome)    Insomnia     Migraines    PCOS (polycystic ovarian syndrome)    PTSD (post-traumatic stress disorder)    Seasonal allergies    Thyroid disease    Past Surgical History:  Procedure Laterality Date   CHOLECYSTECTOMY     GALLBLADDER SURGERY     Social History   Socioeconomic History   Marital status: Single    Spouse name: Not on file   Number of children: 0   Years of education: 74   Highest education level: Master's degree (e.g., MA, MS, MEng, MEd, MSW, MBA)  Occupational History  Occupation: Long Term Disability    Comment: PTSD  Tobacco Use   Smoking status: Never   Smokeless tobacco: Never  Vaping Use   Vaping status: Never Used  Substance and Sexual Activity   Alcohol use: Yes    Alcohol/week: 1.0 standard drink of alcohol    Types: 1 Standard drinks or equivalent per week    Comment: 1 drink a month   Drug use: No   Sexual activity: Not Currently    Partners: Male    Birth control/protection: Abstinence    Comment: 1st intercourse- 16, partners more than 5  Other Topics Concern   Not on file  Social History Narrative   Regular exercise: yes, walk   Caffeine use: occasionally tea   Fun: Social group events, walking her dogs, movies   Denies abuse and feels safe where she lives.    Social Determinants of Health   Financial Resource Strain: Low Risk  (01/08/2022)   Overall Financial Resource Strain (CARDIA)    Difficulty of Paying Living Expenses: Not hard at all  Food Insecurity: No Food Insecurity (01/08/2022)   Hunger Vital Sign    Worried About Running Out of Food in the Last Year: Never true    Ran Out of Food in the Last Year: Never true  Transportation Needs: No Transportation Needs (01/08/2022)   PRAPARE - Administrator, Civil Service (Medical): No    Lack of Transportation (Non-Medical): No  Physical Activity: Inactive (01/08/2022)   Exercise Vital Sign    Days of Exercise per Week: 0 days    Minutes of Exercise per Session: 0 min  Stress: No Stress  Concern Present (01/08/2022)   [name]    Feeling of Stress : Not at all  Social Connections: Socially Isolated (01/08/2022)   Social Connection and Isolation Panel [NHANES]    Frequency of Communication with Friends and Family: Twice a week    Frequency of Social Gatherings with Friends and Family: More than three times a week    Attends Religious Services: Never    Database administrator or Organizations: No    Attends Banker Meetings: Never    Marital Status: Never married  Intimate Partner Violence: Not At Risk (01/08/2022)   Humiliation, Afraid, Rape, and Kick Russell    Fear of Current or Ex-Partner: No    Emotionally Abused: No    Physically Abused: No    Sexually Abused: No   Current Outpatient Medications on File Prior to Visit  Medication Sig Dispense Refill   albuterol (VENTOLIN HFA) 108 (90 Base) MCG/ACT inhaler INHALE 2 PUFFS BY MOUTH EVERY 4 TO 6 HOURS AS DIRECTED(RESCUE) 42.5 g 0   Alpha-Lipoic Acid 600 MG CAPS Take 300 mg by mouth 2 (two) times daily.      atenolol (TENORMIN) 100 MG tablet TAKE 1 TABLET(100 MG) BY MOUTH TWICE DAILY 180 tablet 3   Blood Glucose Monitoring Suppl (ONE TOUCH ULTRA MINI) w/Device KIT Use to check blood sugar 2 times a day. 1 kit 0   Blood Glucose Monitoring Suppl (ONETOUCH VERIO) w/Device KIT Use as instructed to check blood sugar 4X daily 1 kit 0   buPROPion ER (WELLBUTRIN SR) 100 MG 12 hr tablet Take 1 tablet (100 mg total) by mouth 2 (two) times daily. 30 tablet 1   clobetasol cream (TEMOVATE) 0.05 % Apply 1 application topically 2 (two) times daily. 30 g 1   clonazePAM (KLONOPIN)  0.5 MG tablet TAKE 1/2 TABLET BY MOUTH DAILY (Patient taking differently: TAKE 1/4 TABLET BY MOUTH DAILY) 15 tablet 1   Continuous Glucose Receiver (FREESTYLE LIBRE 2 READER) DEVI 1 each by Does not apply route daily. 1 each 0   Continuous Glucose Sensor (FREESTYLE LIBRE 14 DAY  SENSOR) MISC Use for continuous glucose monitoring. 6 each 3   Digestive Enzymes (ENZYME DIGEST PO) Take by mouth.     estradiol (ESTRACE VAGINAL) 0.1 MG/GM vaginal cream Place 1 Applicatorful vaginally at bedtime. Use 3 times a week 42.5 g 12   estradiol-levonorgestrel (CLIMARA PRO) 0.045-0.015 MG/DAY Place 1 patch onto the skin once a week. 4 patch 12   fexofenadine (ALLEGRA) 180 MG tablet Take 180 mg by mouth daily.     fluvastatin XL (LESCOL XL) 80 MG 24 hr tablet TAKE 1 TABLET(80 MG) BY MOUTH DAILY 90 tablet 3   glucose blood (ONETOUCH ULTRA) test strip Use to check blood sugar once a day. 100 strip 11   glucose blood (ONETOUCH VERIO) test strip Use as instructed to check blood sugar 4X daily 300 each 2   Insulin Aspart FlexPen (NOVOLOG) 100 UNIT/ML ADMINISTER 10 TO 30 UNITS UNDER THE SKIN THREE TIMES DAILY BEFORE MEALS 90 mL 1   Insulin Pen Needle (BD PEN NEEDLE NANO 2ND GEN) 32G X 4 MM MISC USE 4 TIMES DAILY 400 each 3   irbesartan (AVAPRO) 75 MG tablet TAKE 1 TABLET(75 MG) BY MOUTH DAILY 90 tablet 3   LANTUS SOLOSTAR 100 UNIT/ML Solostar Pen Inject 20 Units into the skin daily. 30 mL 3   levothyroxine (SYNTHROID) 100 MCG tablet TAKE 1 TABLET(100 MCG) BY MOUTH DAILY 90 tablet 3   methocarbamol (ROBAXIN) 500 MG tablet TAKE 1 TABLET(500 MG) BY MOUTH EVERY 8 HOURS AS NEEDED FOR MUSCLE SPASMS 60 tablet 1   NOVOLOG FLEXPEN 100 UNIT/ML FlexPen Inject 10-30 Units into the skin 3 (three) times daily with meals. 90 mL 0   Olopatadine HCl (PATADAY OP) Apply to eye.     OVER THE COUNTER MEDICATION 1 capsule daily. Probiotic renew life      sertraline (ZOLOFT) 100 MG tablet TAKE 2 TABLETS BY MOUTH EVERY DAY 180 tablet 0   [DISCONTINUED] EFFEXOR XR 150 MG 24 hr capsule 375 mg daily.      No current facility-administered medications on file prior to visit.   Allergies  Allergen Reactions   Latex Itching and Swelling   Fluticasone     Nose bleeds   Metformin And Related     GI side effects    Pravastatin     myalgias   Family History  Problem Relation Age of Onset   Bipolar disorder Mother    Hypertension Mother    Diabetes Mother    Alzheimer's disease Father    Hypertension Father    Alzheimer's disease Maternal Grandmother    Heart disease Paternal Grandfather    PE: BP 120/68 (BP Location: Left Arm, Patient Position: Sitting, Cuff Size: Normal)   Pulse (!) 53   Resp 20   Ht 5' (1.524 m)   Wt 175 lb (79.4 kg)   SpO2 95%   BMI 34.18 kg/m    Wt Readings from Last 3 Encounters:  07/26/23 175 lb (79.4 kg)  05/07/23 173 lb 3.2 oz (78.6 kg)  03/19/23 173 lb (78.5 kg)   Constitutional: overweight, in NAD Eyes:  EOMI, no exophthalmos ENT: no neck masses, no cervical lymphadenopathy Cardiovascular: RRR, No MRG Respiratory: CTA B Musculoskeletal:  no deformities Skin:no rashes Neurological: no tremor with outstretched hands  ASSESSMENT: 1. DM2, insulin-dependent, fairly well controlled, with complications - DR  2. HL - Very high LDL - high HDL - slightly high TG  3.  Hypothyroidism  Russell:  1. DM2 - Patient with insulin-dependent type 2 diabetes, on with worse control at last visit, when an HbA1c returned 6.5%, higher.  She is on a basal/bolus insulin regimen as she had multiple medication intolerances in the past.  At last visit, sugars were fluctuating, with higher values in the second half of the day but also with quite significant lows especially after an initial increase in blood sugars after meals.  Upon questioning, she was forgetting to take the NovoLog before meals and she was taking a large dose after the meals.  I advised her to try her best to take the NovoLog 15 minutes before each meal but if she forgot, to take only two thirds of the insulin dose depending on the time since the meal.  I advised her to let me know if she had more lows afterwards. CGM interpretation: -At today's visit, we reviewed her CGM downloads: It appears that 68% of values are  in target range (goal >70%), while 28% are higher than 180 (goal <25%), and 4% are lower than 70 (goal <4%).  The calculated average blood sugar is 155.  The projected HbA1c for the next 3 months (GMI) is 7.0%. -Reviewing the CGM trends, sugars fluctuate mostly within the target range with occasional higher blood sugars after dietary indiscretions (she has many sweets including cookies at night).  When the sugars are too high after the meal, she may take up to 20 units of insulin to correct these.  We discussed that this is too much and indeed, her sugars are dropping during the night, due to overcorrection.  At today's visit I advised her to continue the current insulin doses but I did give her a sliding scale for NovoLog with reduced correction doses. - I advised her to:  Patient Instructions  Please continue: - Lantus 20 units in am - Novolog  10-12 units before a smaller meal 16-17 units before a regular meal 22-26 units before a high-carb meal If you take insulin after the meal, take 2/3 of the insulin dose depending on the time since the meal.  Add the following sliding scale for NovoLog: - 150- 165: + 1 unit  - 166- 180: + 2 units  - 181- 195: + 3 units  - 196- 210: + 4 units  - 211- 225: + 5 units  - 226- 240: + 6 units  - >240 + 7 units    Please continue: - Levothyroxine 100 mg daily  Take the thyroid hormone every day, with water, at least 30 minutes before breakfast, separated by at least 4 hours from: - acid reflux medications - calcium - iron - multivitamins  Please stop at the lab.   Please return in 4 months.  - we checked her HbA1c: 6.6% (higher) - advised to check sugars at different times of the day - 4x a day, rotating check times - advised for yearly eye exams >> she is UTD - she is due for annual labs - will check them today - return to clinic in 4 months  2. HL -Reviewed latest lipid panel: Very high LDL: Lab Results  Component Value Date   CHOL 346  (H) 02/22/2022   HDL 130.70 02/22/2022   LDLCALC 191 (H) 02/22/2022  LDLDIRECT 205.0 01/24/2016   TRIG 121.0 02/22/2022   CHOLHDL 3 02/22/2022  -She previous she could not tolerate Livalo. I Advised her to try Zetia but she declined that she read that this could cause diarrhea. -She climbed a referral to the lipid clinic for PCSK9 inhibitors -She continues to take fluvastatin XL inconsistently -She used to eat ice cream and other sweets - she started fish oil 2400 mg daily -She is due for another lipid panel-will check today  3.   Hypothyroidism - latest thyroid labs reviewed with pt. >> normal: Lab Results  Component Value Date   TSH 1.78 09/27/2022  - she continues on LT4 100 mcg daily - pt feels good on this dose. - we discussed about taking the thyroid hormone every day, with water, >30 minutes before breakfast, separated by >4 hours from acid reflux medications, calcium, iron, multivitamins. Pt. is taking it correctly. - will check thyroid tests today: TSH and fT4 - If labs are abnormal, she will need to return for repeat TFTs in 1.5 months  Component     Latest Ref Rng 07/26/2023  T4,Free(Direct)     0.60 - 1.60 ng/dL 7.82   TSH     9.56 - 2.13 uIU/mL 6.85 (H)   Hemoglobin A1C     4.0 - 5.6 % 6.6 !   Cholesterol     0 - 200 mg/dL 086 (H)   Triglycerides     0.0 - 149.0 mg/dL 57.8   HDL Cholesterol     >39.00 mg/dL 46.96   VLDL     0.0 - 40.0 mg/dL 29.5   LDL (calc)     0 - 99 mg/dL 284 (H)   Total CHOL/HDL Ratio 3   NonHDL 178.25   Microalb, Ur     0.0 - 1.9 mg/dL 9.1 (H)   Creatinine,U     mg/dL 13.2   MICROALB/CREAT RATIO     0.0 - 30.0 mg/g 9.4   Glucose     70 - 99 mg/dL 440 (H)   BUN     6 - 23 mg/dL 24 (H)   Creatinine     0.40 - 1.20 mg/dL 1.02   Sodium     725 - 145 mEq/L 137   Potassium     3.5 - 5.1 mEq/L 4.3   Chloride     96 - 112 mEq/L 101   CO2     19 - 32 mEq/L 28   Calcium     8.4 - 10.5 mg/dL 9.7   Total Protein     6.0 - 8.3  g/dL 7.9   Total Bilirubin     0.2 - 1.2 mg/dL 0.4   AST     0 - 37 U/L 20   ALT     0 - 35 U/L 20   Alkaline Phosphatase     39 - 117 U/L 79   Albumin     3.5 - 5.2 g/dL 4.2   GFR     >36.64 mL/min 62.62   Elevated LDL, at 160, decreased from prior when he was 191.  She definitely needs dietary changes.  I will also suggested red yeast rice.  TSH is also elevated, which is unusual, since she had normal thyroid tests on this dose before.  I would like to repeat her TFTs before changing the dose.  Carlus Pavlov, MD PhD [facility] Endocrinology

## 2023-07-26 NOTE — Patient Instructions (Addendum)
Please continue: - Lantus 20 units in am - Novolog  10-12 units before a smaller meal 16-17 units before a regular meal 22-26 units before a high-carb meal If you take insulin after the meal, take 2/3 of the insulin dose depending on the time since the meal.  Add the following sliding scale for NovoLog: - 150- 165: + 1 unit  - 166- 180: + 2 units  - 181- 195: + 3 units  - 196- 210: + 4 units  - 211- 225: + 5 units  - 226- 240: + 6 units  - >240 + 7 units    Please continue: - Levothyroxine 100 mg daily  Take the thyroid hormone every day, with water, at least 30 minutes before breakfast, separated by at least 4 hours from: - acid reflux medications - calcium - iron - multivitamins  Please stop at the lab.   Please return in 4 months.

## 2023-07-30 ENCOUNTER — Ambulatory Visit: Payer: PPO | Admitting: Psychology

## 2023-07-30 DIAGNOSIS — F4310 Post-traumatic stress disorder, unspecified: Secondary | ICD-10-CM | POA: Diagnosis not present

## 2023-07-30 DIAGNOSIS — F3341 Major depressive disorder, recurrent, in partial remission: Secondary | ICD-10-CM | POA: Diagnosis not present

## 2023-07-30 DIAGNOSIS — F411 Generalized anxiety disorder: Secondary | ICD-10-CM
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
## 2023-08-13 ENCOUNTER — Ambulatory Visit (INDEPENDENT_AMBULATORY_CARE_PROVIDER_SITE_OTHER): Payer: PPO | Admitting: Psychology

## 2023-08-13 DIAGNOSIS — F3341 Major depressive disorder, recurrent, in partial remission: Secondary | ICD-10-CM | POA: Diagnosis not present

## 2023-08-13 DIAGNOSIS — F411 Generalized anxiety disorder: Secondary | ICD-10-CM

## 2023-08-13 DIAGNOSIS — F4310 Post-traumatic stress disorder, unspecified: Secondary | ICD-10-CM | POA: Diagnosis not present

## 2023-08-13 NOTE — Progress Notes (Signed)
[facility] Counselor/Therapist Progress Note  Patient ID: [name], MRN: [id number]    Date: 08/13/23  Time Spent: 1:04 pm -  1:59 pm : 55 Minutes  Treatment Type: Individual Therapy.  Reported Symptoms: Depression and Anxiety.   Mental Status Exam: Appearance:  Casual     Behavior: Appropriate  Motor: Normal  Speech/Language:  Clear and Coherent  Affect: Appropriate  Mood: dysthymic  Thought process: normal  Thought content:   WNL  Sensory/Perceptual disturbances:   WNL  Orientation: oriented to person, place, time/date, and situation  Attention: Good  Concentration: Good  Memory: WNL  Fund of knowledge:  Good  Insight:   Good  Judgment:  Good  Impulse Control: Good   Risk Assessment: Danger to Self:  No Self-injurious Behavior: No Danger to Others: No Duty to Warn:no Physical Aggression / Violence:No  Access to Firearms a concern: No  Gang Involvement:No   Subjective:   [name] participated in the session from  the office, and consented totreatment with the therapist. [name] noted the events of the past week. She noted working on identifying what makes her feel more confident. She noted feeling successful helping others including her pets and people. 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 We worked on identifying additional contributing factors to confidence including being with friends, doing things well (tasks). She noted that she will be happy when the christmas season is over due to it being a reminder that she is alone. She noted her efforts to reach out to friends and not hearing word back. She noted  previously being a part of a church and building a support system via church. She noted being able to socialize, to some degree, during her college career. She noted feeling like "I'm not a part of a community any more". She noted previously being a part of meet-up groups in the past as an avenue to  socialize. Additionally, she noted engaging in speed dating. She stated that she has "no idea" how to socialize and noted it being "safer" to be at home. Therapist encouraged [name] to create a list of possible opportunities to socialized and to do additional research regarding viability. We identifying the senior center, taking classes (exercise or language), and pursue meet-up groups. [name] was engaged and motivated during the session. She expressed commitment towards our goals. Therapist praised [name] for her effort in this area. Therapist validated [name]'s feelings and provided supportive therapy.   Interventions:  BA & CBT  Diagnosis:    Major depressive disorder, recurrent episode, in partial remission (HCC)  GAD (generalized anxiety disorder)  Posttraumatic stress disorder  Psychiatric Treatment: Yes , Dr. Fredda Hammed.  See chart.   Treatment Plan:  Client Abilities/Strengths [name] is intelligent, well spoken, and motivated for change.   Support System: Friends from church.   Client Treatment Preferences OPT  Client Statement of Needs [name] would like to increase motivation and task, becoming more social and date, managing overall mood, processing past events, bolstering coping skills, managing overall symptoms, & increasing mindfulness.     Treatment Level Weekly  Symptoms  Depression:  Loss of interest, feeling down, poor sleep, lethargy, poor appetite, difficulty concentrating  (Status: maintained) Anxiety: feeling nervous, worrying about different things, trouble relaxing, irritability, feeling afraid as if something awful might happen, anxiety leaving the home.   (Status: maintained)  Goals:   [name] experiences symptoms of anxiety, depression, and PTSD.   Target Date: 10/23/23 Frequency:  Weekly  Progress: 0 Modality: individual    Therapist will provide referrals for additional resources as appropriate.  Therapist will provide psycho-education regarding [name]'s diagnosis and  corresponding treatment approaches and interventions. Licensed Clinical Social Worker, [name], [name] will support the patient's ability to achieve the goals identified. will employ CBT, BA, Problem-solving, Solution Focused, Mindfulness,  coping skills, & other evidenced-based practices will be used to promote progress towards healthy functioning to help manage decrease symptoms associated with her diagnosis.   Reduce overall level, frequency, and intensity of the feelings of depression & anxiety evidenced by decreased overall symptoms from 6 to 7 days/week to 0 to 1 days/week per client report for at least 3 consecutive months. Verbally express understanding of the relationship between feelings of depression, anxiety and their impact on thinking patterns and behaviors. Verbalize an understanding of the role that distorted thinking plays in creating fears, excessive worry, and ruminations.  [name] participated in the creation of the treatment plan)    [name], [name]

## 2023-08-18 ENCOUNTER — Other Ambulatory Visit (HOSPITAL_COMMUNITY): Payer: Self-pay | Admitting: Psychiatry

## 2023-08-18 DIAGNOSIS — F3341 Major depressive disorder, recurrent, in partial remission: Secondary | ICD-10-CM

## 2023-08-20 ENCOUNTER — Encounter: Payer: Self-pay | Admitting: Family

## 2023-08-20 ENCOUNTER — Ambulatory Visit: Payer: PPO | Admitting: Family

## 2023-08-20 VITALS — BP 122/74 | HR 55 | Temp 97.5°F | Resp 16 | Ht 60.0 in | Wt 173.4 lb

## 2023-08-20 DIAGNOSIS — L309 Dermatitis, unspecified: Secondary | ICD-10-CM

## 2023-08-20 DIAGNOSIS — I10 Essential (primary) hypertension: Secondary | ICD-10-CM | POA: Diagnosis not present

## 2023-08-20 DIAGNOSIS — M62838 Other muscle spasm: Secondary | ICD-10-CM | POA: Diagnosis not present

## 2023-08-20 DIAGNOSIS — F332 Major depressive disorder, recurrent severe without psychotic features: Secondary | ICD-10-CM

## 2023-08-20 DIAGNOSIS — Z0000 Encounter for general adult medical examination without abnormal findings: Secondary | ICD-10-CM

## 2023-08-20 DIAGNOSIS — Z23 Encounter for immunization: Secondary | ICD-10-CM | POA: Diagnosis not present

## 2023-08-20 DIAGNOSIS — L409 Psoriasis, unspecified: Secondary | ICD-10-CM | POA: Diagnosis not present

## 2023-08-20 DIAGNOSIS — Z1231 Encounter for screening mammogram for malignant neoplasm of breast: Secondary | ICD-10-CM

## 2023-08-20 LAB — CBC WITH DIFFERENTIAL/PLATELET
Basophils Absolute: 0.1 10*3/uL (ref 0.0–0.1)
Basophils Relative: 0.6 % (ref 0.0–3.0)
Eosinophils Absolute: 0.7 10*3/uL (ref 0.0–0.7)
Eosinophils Relative: 6.5 % — ABNORMAL HIGH (ref 0.0–5.0)
HCT: 34.8 % — ABNORMAL LOW (ref 36.0–46.0)
Hemoglobin: 11.4 g/dL — ABNORMAL LOW (ref 12.0–15.0)
Lymphocytes Relative: 20.3 % (ref 12.0–46.0)
Lymphs Abs: 2 10*3/uL (ref 0.7–4.0)
MCHC: 32.7 g/dL (ref 30.0–36.0)
MCV: 84.6 fL (ref 78.0–100.0)
Monocytes Absolute: 0.6 10*3/uL (ref 0.1–1.0)
Monocytes Relative: 6.2 % (ref 3.0–12.0)
Neutro Abs: 6.6 10*3/uL (ref 1.4–7.7)
Neutrophils Relative %: 66.4 % (ref 43.0–77.0)
Platelets: 287 10*3/uL (ref 150.0–400.0)
RBC: 4.11 Mil/uL (ref 3.87–5.11)
RDW: 15.5 % (ref 11.5–15.5)
WBC: 10 10*3/uL (ref 4.0–10.5)

## 2023-08-20 MED ORDER — IRBESARTAN 75 MG PO TABS: ORAL_TABLET | ORAL | 3 refills | Status: DC

## 2023-08-20 MED ORDER — METHOCARBAMOL 500 MG PO TABS: ORAL_TABLET | ORAL | 1 refills | Status: DC

## 2023-08-20 MED ORDER — ATENOLOL 100 MG PO TABS: ORAL_TABLET | ORAL | 3 refills | Status: DC

## 2023-08-20 NOTE — Progress Notes (Signed)
[name] is a 57 y.o. female with the following history as recorded in Epic[employer]:  Patient Active Problem List   Diagnosis Date Noted   Class 1 obesity 12/22/2019   ASCUS with positive high risk HPV cervical 03/13/2017   Type 2 diabetes mellitus with hyperglycemia, without long-term current use of insulin (HCC) 03/28/2016   Routine general medical examination at a health care facility 01/24/2016   Acquired hypothyroidism 01/24/2016   Anemia 04/14/2014   Hyperlipidemia 08/18/2013   Essential hypertension 08/26/2012   Irregular menses 08/07/2012   History of abnormal Pap smear 08/07/2012   GERD (gastroesophageal reflux disease) 08/07/2012   Asthma, mild persistent 08/07/2012   Migraine 08/07/2012   History of PCOS 08/07/2012   Genital warts 08/07/2012   S/P cholecystectomy 08/07/2012   Seasonal and perennial allergic rhinitis 08/07/2012   Severe episode of recurrent major depressive disorder (HCC) 12/06/2011   PTSD (post-traumatic stress disorder) 11/02/2011    Current Outpatient Medications  Medication Sig Dispense Refill   albuterol (VENTOLIN HFA) 108 (90 Base) MCG/ACT inhaler INHALE 2 PUFFS BY MOUTH EVERY 4 TO 6 HOURS AS DIRECTED(RESCUE) 42.5 g 0   Alpha-Lipoic Acid 600 MG CAPS Take 300 mg by mouth 2 (two) times daily.      Blood Glucose Monitoring Suppl (ONE TOUCH ULTRA MINI) w/Device KIT Use to check blood sugar 2 times a day. 1 kit 0   Blood Glucose Monitoring Suppl (ONETOUCH VERIO) w/Device KIT Use as instructed to check blood sugar 4X daily 1 kit 0   clobetasol cream (TEMOVATE) 0.05 % Apply 1 application topically 2 (two) times daily. 30 g 1   clonazePAM (KLONOPIN) 0.5 MG tablet TAKE 1/2 TABLET BY MOUTH DAILY (Patient taking differently: TAKE 1/4 TABLET BY MOUTH DAILY) 15 tablet 1   Continuous Glucose Receiver (FREESTYLE LIBRE 2 READER) DEVI 1 each by Does not apply route daily. 1 each 0   Continuous Glucose Sensor (FREESTYLE LIBRE 14 DAY SENSOR) MISC Use for continuous  glucose monitoring. 6 each 3   Digestive Enzymes (ENZYME DIGEST PO) Take by mouth.     estradiol (ESTRACE VAGINAL) 0.1 MG/GM vaginal cream Place 1 Applicatorful vaginally at bedtime. Use 3 times a week 42.5 g 12   fexofenadine (ALLEGRA) 180 MG tablet Take 180 mg by mouth daily.     fluvastatin XL (LESCOL XL) 80 MG 24 hr tablet TAKE 1 TABLET(80 MG) BY MOUTH DAILY 90 tablet 3   glucose blood (ONETOUCH ULTRA) test strip Use to check blood sugar once a day. 100 strip 11   glucose blood (ONETOUCH VERIO) test strip Use as instructed to check blood sugar 4X daily 300 each 2   Insulin Aspart FlexPen (NOVOLOG) 100 UNIT/ML ADMINISTER 10 TO 30 UNITS UNDER THE SKIN THREE TIMES DAILY BEFORE MEALS 90 mL 1   Insulin Pen Needle (BD PEN NEEDLE NANO 2ND GEN) 32G X 4 MM MISC USE 4 TIMES DAILY 400 each 3   LANTUS SOLOSTAR 100 UNIT/ML Solostar Pen Inject 20 Units into the skin daily. 30 mL 3   levothyroxine (SYNTHROID) 100 MCG tablet TAKE 1 TABLET(100 MCG) BY MOUTH DAILY 90 tablet 3   NOVOLOG FLEXPEN 100 UNIT/ML FlexPen Inject 10-30 Units into the skin 3 (three) times daily with meals. 90 mL 0   Olopatadine HCl (PATADAY OP) Apply to eye.     OVER THE COUNTER MEDICATION 1 capsule daily. Probiotic renew life      sertraline (ZOLOFT) 100 MG tablet TAKE 2 TABLETS BY MOUTH EVERY DAY 180  tablet 0   atenolol (TENORMIN) 100 MG tablet TAKE 1 TABLET(100 MG) BY MOUTH TWICE DAILY 180 tablet 3   buPROPion ER (WELLBUTRIN SR) 100 MG 12 hr tablet Take 1 tablet (100 mg total) by mouth 2 (two) times daily. (Patient not taking: Reported on 08/20/2023) 30 tablet 1   irbesartan (AVAPRO) 75 MG tablet TAKE 1 TABLET(75 MG) BY MOUTH DAILY 90 tablet 3   methocarbamol (ROBAXIN) 500 MG tablet TAKE 1 TABLET(500 MG) BY MOUTH EVERY 8 HOURS AS NEEDED FOR MUSCLE SPASMS 60 tablet 1   No current facility-administered medications for this visit.    Allergies: Latex, Fluticasone, Metformin and related, and Pravastatin  Past Medical History:   Diagnosis Date   Abnormal Papanicolaou smear of cervix with positive human papilloma virus (HPV) test 02/2017   Anxiety    Asthma    Depression    Diabetes mellitus type II    Food intolerance    Headache    High blood pressure    IBS (irritable bowel syndrome)    Insomnia    Migraines    PCOS (polycystic ovarian syndrome)    PTSD (post-traumatic stress disorder)    Seasonal allergies    Thyroid disease     Past Surgical History:  Procedure Laterality Date   CHOLECYSTECTOMY     GALLBLADDER SURGERY      Family History  Problem Relation Age of Onset   Bipolar disorder Mother    Hypertension Mother    Diabetes Mother    Alzheimer's disease Father    Hypertension Father    Alzheimer's disease Maternal Grandmother    Heart disease Paternal Grandfather     Social History   Tobacco Use   Smoking status: Never   Smokeless tobacco: Never  Substance Use Topics   Alcohol use: Yes    Alcohol/week: 1.0 standard drink of alcohol    Types: 1 Standard drinks or equivalent per week    Comment: 1 drink a month    Subjective:   Presents for yearly CPE; majority of visits are managed by her specialists including endocrine, GYN and psychiatrist;  Mammogram was ordered in September 2024 by patient's GYN;  Concerned about extensive rash on both forearms- noticed in the past 2 months- started in the scalp and progressed to both arms; per patient, denies any new soaps, foods, detergents or medications. She googled her symptoms and has decided she has psoriasis;  Review of Systems  Constitutional: Negative.   HENT: Negative.    Eyes: Negative.   Respiratory: Negative.    Cardiovascular: Negative.   Gastrointestinal: Negative.   Genitourinary: Negative.   Musculoskeletal: Negative.   Skin:  Positive for itching and rash.  Neurological: Negative.   Endo/Heme/Allergies: Negative.   Psychiatric/Behavioral: Negative.        Objective:  Vitals:   08/20/23 1054  BP: 122/74   Pulse: (!) 55  Resp: 16  Temp: (!) 97.5 F (36.4 C)  TempSrc: Oral  SpO2: 94%  Weight: 173 lb 6.4 oz (78.7 kg)  Height: 5' (1.524 m)    General: Well developed, well nourished, in no acute distress  Skin : Warm and dry. Extensive scarring from lesions noted bilateral on both forearms- areas of concern are different stages of healing from new lesions to scars Head: Normocephalic and atraumatic  Eyes: Sclera and conjunctiva clear; pupils round and reactive to light; extraocular movements intact  Ears: External normal; canals clear; tympanic membranes normal  Oropharynx: Pink, supple. No suspicious lesions  Neck: Supple  without thyromegaly, adenopathy  Lungs: Respirations unlabored; clear to auscultation bilaterally without wheeze, rales, rhonchi  CVS exam: normal rate and regular rhythm.  Abdomen: Soft; nontender; nondistended; normoactive bowel sounds; no masses or hepatosplenomegaly  Musculoskeletal: No deformities; no active joint inflammation  Extremities: No edema, cyanosis, clubbing  Vessels: Symmetric bilaterally  Neurologic: Alert and oriented; speech intact; face symmetrical; moves all extremities well; CNII-XII intact without focal deficit   Assessment:  1. PE (physical exam), annual   2. Dermatitis   3. Psoriasis   4. Visit for screening mammogram   5. Essential hypertension   6. Muscle spasms of neck   7. Need for influenza vaccination   8. Major depressive disorder, recurrent severe without psychotic features (HCC) Chronic    Plan:  Age appropriate preventive healthcare needs addressed; encouraged regular eye doctor and dental exams; encouraged regular exercise;  Am concerned about appearance of rash on forearms- do think biopsy is necessary/ referral to dermatology;  Order for mammogram updated; flu shot updated;  Follow up in 1 year/ continue with endocrine and mental health providers as scheduled;    No follow-ups on file.  Orders Placed This Encounter   Procedures   MM Digital Screening    Standing Status:   Future    Standing Expiration Date:   08/19/2024    Order Specific Question:   Reason for Exam (SYMPTOM  OR DIAGNOSIS REQUIRED)    Answer:   screening mammogram    Order Specific Question:   Is the patient pregnant?    Answer:   No    Order Specific Question:   Preferred imaging location?    Answer:   [facility]   Flu vaccine trivalent PF, 6mos and older(Flulaval,Afluria,Fluarix,Fluzone)   CBC with Differential/Platelet   Ambulatory referral to Dermatology    Referral Priority:   Routine    Referral Type:   Consultation    Referral Reason:   Specialty Services Required    Requested Specialty:   Dermatology    Number of Visits Requested:   1    Requested Prescriptions   Signed Prescriptions Disp Refills   atenolol (TENORMIN) 100 MG tablet 180 tablet 3    Sig: TAKE 1 TABLET(100 MG) BY MOUTH TWICE DAILY   irbesartan (AVAPRO) 75 MG tablet 90 tablet 3    Sig: TAKE 1 TABLET(75 MG) BY MOUTH DAILY   methocarbamol (ROBAXIN) 500 MG tablet 60 tablet 1    Sig: TAKE 1 TABLET(500 MG) BY MOUTH EVERY 8 HOURS AS NEEDED FOR MUSCLE SPASMS

## 2023-08-27 ENCOUNTER — Ambulatory Visit: Payer: PPO | Admitting: Psychology

## 2023-08-27 DIAGNOSIS — F411 Generalized anxiety disorder: Secondary | ICD-10-CM

## 2023-08-27 DIAGNOSIS — F4310 Post-traumatic stress disorder, unspecified: Secondary | ICD-10-CM | POA: Diagnosis not present

## 2023-08-27 DIAGNOSIS — F3341 Major depressive disorder, recurrent, in partial remission: Secondary | ICD-10-CM

## 2023-08-27 NOTE — Progress Notes (Signed)
[facility] Counselor/Therapist Progress Note  Patient ID: GENETTE BUNN, MRN: [id number]    Date: 08/27/23  Time Spent: 1:01 pm -  2:00 pm : 59 Minutes  Treatment Type: Individual Therapy.  Reported Symptoms: Depression and Anxiety.   Mental Status Exam: Appearance:  Casual     Behavior: Appropriate  Motor: Normal  Speech/Language:  Clear and Coherent  Affect: Appropriate  Mood: dysthymic  Thought process: normal  Thought content:   WNL  Sensory/Perceptual disturbances:   WNL  Orientation: oriented to person, place, time/date, and situation  Attention: Good  Concentration: Good  Memory: WNL  Fund of knowledge:  Good  Insight:   Good  Judgment:  Good  Impulse Control: Good   Risk Assessment: Danger to Self:  No Self-injurious Behavior: No Danger to Others: No Duty to Warn:no Physical Aggression / Violence:No  Access to Firearms a concern: No  Gang Involvement:No   Subjective:   ANIAYAH CHIODINI participated in the session from  the office, and consented totreatment with the therapist. Lashawne noted the events of the past week. She noted feeling accomplished with an issue with her dog's care due to barriers due to the vet and dog, itself. She noted feeling physically better regarding her psoriasis. She noted that her father is more social than she is. We worked on identifying what could be contributing to his ability to socialize at a high level. Therapist encouraged Neelah to identify possible opportunities to socialize that fit her needs, likes, and budget as a list to process through going forward. She noted past relationships and we worked on identifying specific characteristics in those relatoinships that she would look for in a new friends group. We will work on processing this going forward and identifying ways to engage in said activities. She noted interest in doing house updates and repairs. Allondra was engaged and motivated during the session. She expressed  commitment towards goals. Therapist praised Delanda for her effort and provided supportive therapy. A follow-up was scheduled for continued treatment.   Interventions:  BA & CBT  Diagnosis:    Major depressive disorder, recurrent episode, in partial remission (HCC)  GAD (generalized anxiety disorder)  Posttraumatic stress disorder  Psychiatric Treatment: Yes , Dr. Fredda Hammed.  See chart.   Treatment Plan:  Client Abilities/Strengths Tineka is intelligent, well spoken, and motivated for change.   Support System: Friends from church.   Client Treatment Preferences OPT  Client Statement of Needs Renell would like to increase motivation and task, becoming more social and date, managing overall mood, processing past events, bolstering coping skills, managing overall symptoms, & increasing mindfulness.     Treatment Level Weekly  Symptoms  Depression:  Loss of interest, feeling down, poor sleep, lethargy, poor appetite, difficulty concentrating  (Status: maintained) Anxiety: feeling nervous, worrying about different things, trouble relaxing, irritability, feeling afraid as if something awful might happen, anxiety leaving the home.   (Status: maintained)  Goals:   Whitlee experiences symptoms of anxiety, depression, and PTSD.   Target Date: 10/23/23 Frequency: Weekly  Progress: 0 Modality: individual    Therapist will provide referrals for additional resources as appropriate.  Therapist will provide psycho-education regarding Vira's diagnosis and corresponding treatment approaches and interventions. Licensed Clinical Social Worker, [place], [name] will support the patient's ability to achieve the goals identified. will employ CBT, BA, Problem-solving, Solution Focused, Mindfulness,  coping skills, & other evidenced-based practices will be used to promote progress towards healthy functioning to help manage decrease symptoms associated  with her diagnosis.   Reduce overall level, frequency,  and intensity of the feelings of depression & anxiety evidenced by decreased overall symptoms from 6 to 7 days/week to 0 to 1 days/week per client report for at least 3 consecutive months. Verbally express understanding of the relationship between feelings of depression, anxiety and their impact on thinking patterns and behaviors. Verbalize an understanding of the role that distorted thinking plays in creating fears, excessive worry, and ruminations.  [name] participated in the creation of the treatment plan)    [name], [name]

## 2023-09-07 ENCOUNTER — Encounter: Payer: Self-pay | Admitting: Obstetrics and Gynecology

## 2023-09-09 ENCOUNTER — Encounter: Payer: Self-pay | Admitting: Family

## 2023-09-09 ENCOUNTER — Telehealth: Payer: Self-pay | Admitting: Family

## 2023-09-09 ENCOUNTER — Telehealth (HOSPITAL_COMMUNITY): Payer: Self-pay | Admitting: *Deleted

## 2023-09-09 ENCOUNTER — Other Ambulatory Visit: Payer: Self-pay | Admitting: Family

## 2023-09-09 ENCOUNTER — Other Ambulatory Visit (HOSPITAL_COMMUNITY): Payer: Self-pay | Admitting: Psychiatry

## 2023-09-09 DIAGNOSIS — Z1211 Encounter for screening for malignant neoplasm of colon: Secondary | ICD-10-CM

## 2023-09-09 DIAGNOSIS — E1165 Type 2 diabetes mellitus with hyperglycemia: Secondary | ICD-10-CM

## 2023-09-09 NOTE — Telephone Encounter (Signed)
Copied from CRM [id number]. Topic: Referral - Question >> Sep 09, 2023  8:44 AM Irine Seal wrote: Reason for CRM: PT is wanting a referral for cardiologist, Dr. Noralyn Pick. Nishan  [phone]. PT has not seen a cardiologist in 10 years, and would like to establish care for age related reasons.

## 2023-09-09 NOTE — Telephone Encounter (Signed)
-----   Message from Olive Bass sent at 08/20/2023 11:32 AM EST ----- Needs Cologuard

## 2023-09-09 NOTE — Telephone Encounter (Signed)
Refill Request  clonazePAM (KLONOPIN) 0.5 MG tablet   [id number] [place], [place] - [address]    Next Appt  12/24/23 Last Appt  Next appt--06/25/23

## 2023-09-09 NOTE — Telephone Encounter (Signed)
MyChart message sent to patient.

## 2023-09-17 ENCOUNTER — Ambulatory Visit: Payer: PPO | Admitting: Allergy

## 2023-09-23 ENCOUNTER — Telehealth: Payer: Self-pay

## 2023-09-23 ENCOUNTER — Telehealth (HOSPITAL_COMMUNITY): Payer: Self-pay | Admitting: Psychiatry

## 2023-09-23 NOTE — Telephone Encounter (Signed)
 Copied from CRM [id number]. Topic: Referral - Question >> Sep 23, 2023  3:18 PM Franky GRADE wrote: Reason for CRM: Patient is calling to see if Dr.Murray has any recommendations for a psychiatrist. She is currently seeing someone but it's just to far for her.She is currently in [place] and would like to see someone there. >> Sep 23, 2023  3:25 PM Franky GRADE wrote: Patient's alternate number [phone]

## 2023-09-23 NOTE — Telephone Encounter (Signed)
 Pt would like to talk to [facility] about zoloft, possibly tapering off the medication.  Also see about getting help for some sleep.   [facility] on [address]   [phone] Cell if needed: [phone]

## 2023-09-24 ENCOUNTER — Ambulatory Visit (INDEPENDENT_AMBULATORY_CARE_PROVIDER_SITE_OTHER): Payer: PPO | Admitting: Family

## 2023-09-24 ENCOUNTER — Telehealth: Payer: Self-pay | Admitting: Family

## 2023-09-24 ENCOUNTER — Ambulatory Visit (HOSPITAL_BASED_OUTPATIENT_CLINIC_OR_DEPARTMENT_OTHER)
Admission: RE | Admit: 2023-09-24 | Discharge: 2023-09-24 | Disposition: A | Discharge status: Home / Self Care | Payer: PPO | Source: Ambulatory Visit | Attending: Family | Admitting: Family

## 2023-09-24 ENCOUNTER — Ambulatory Visit: Payer: PPO | Admitting: Psychology

## 2023-09-24 ENCOUNTER — Encounter: Payer: Self-pay | Admitting: Family

## 2023-09-24 VITALS — BP 136/84 | HR 62 | Ht 60.0 in | Wt 173.2 lb

## 2023-09-24 DIAGNOSIS — R300 Dysuria: Secondary | ICD-10-CM

## 2023-09-24 DIAGNOSIS — R531 Weakness: Secondary | ICD-10-CM | POA: Diagnosis not present

## 2023-09-24 DIAGNOSIS — M542 Cervicalgia: Secondary | ICD-10-CM | POA: Diagnosis not present

## 2023-09-24 DIAGNOSIS — G5603 Carpal tunnel syndrome, bilateral upper limbs: Secondary | ICD-10-CM | POA: Diagnosis not present

## 2023-09-24 DIAGNOSIS — R059 Cough, unspecified: Secondary | ICD-10-CM

## 2023-09-24 DIAGNOSIS — R062 Wheezing: Secondary | ICD-10-CM | POA: Diagnosis not present

## 2023-09-24 LAB — POCT URINALYSIS DIPSTICK
Bilirubin, UA: NEGATIVE
Blood, UA: NEGATIVE
Glucose, UA: NEGATIVE
Ketones, UA: NEGATIVE
Nitrite, UA: NEGATIVE
Protein, UA: NEGATIVE
Spec Grav, UA: 1.015 (ref 1.010–1.025)
Urobilinogen, UA: 0.2 U/dL
pH, UA: 5 (ref 5.0–8.0)

## 2023-09-24 MED ORDER — ALBUTEROL SULFATE HFA 108 (90 BASE) MCG/ACT IN AERS: INHALATION_SPRAY | RESPIRATORY_TRACT | 0 refills | Status: DC

## 2023-09-24 NOTE — Telephone Encounter (Signed)
 I left VM for patient today after getting message back from her psychiatrist. I wanted to make sure she was aware that Dr. Geralene did want her to call and schedule an appointment to be seen there as soon as possible.   Please call patient on Wednesday, January 7 to follow up on this message and make sure patient is aware.

## 2023-09-24 NOTE — Progress Notes (Signed)
 [name] is a 58 y.o. female with the following history as recorded in Epic[employer]:  Patient Active Problem List   Diagnosis Date Noted   Class 1 obesity 12/22/2019   ASCUS with positive high risk HPV cervical 03/13/2017   Type 2 diabetes mellitus with hyperglycemia, without long-term current use of insulin  (HCC) 03/28/2016   Routine general medical examination at a health care facility 01/24/2016   Acquired hypothyroidism 01/24/2016   Anemia 04/14/2014   Hyperlipidemia 08/18/2013   Essential hypertension 08/26/2012   Irregular menses 08/07/2012   History of abnormal Pap smear 08/07/2012   GERD (gastroesophageal reflux disease) 08/07/2012   Asthma, mild persistent 08/07/2012   Migraine 08/07/2012   History of PCOS 08/07/2012   Genital warts 08/07/2012   S/P cholecystectomy 08/07/2012   Seasonal and perennial allergic rhinitis 08/07/2012   Severe episode of recurrent major depressive disorder (HCC) 12/06/2011   PTSD (post-traumatic stress disorder) 11/02/2011    Current Outpatient Medications  Medication Sig Dispense Refill   Alpha-Lipoic Acid 600 MG CAPS Take 300 mg by mouth 2 (two) times daily.      atenolol  (TENORMIN ) 100 MG tablet TAKE 1 TABLET(100 MG) BY MOUTH TWICE DAILY 180 tablet 3   Blood Glucose Monitoring Suppl (ONE TOUCH ULTRA MINI) w/Device KIT Use to check blood sugar 2 times a day. 1 kit 0   Blood Glucose Monitoring Suppl (ONETOUCH VERIO) w/Device KIT Use as instructed to check blood sugar 4X daily 1 kit 0   buPROPion  ER (WELLBUTRIN  SR) 100 MG 12 hr tablet Take 1 tablet (100 mg total) by mouth 2 (two) times daily. 30 tablet 1   clobetasol  cream (TEMOVATE ) 0.05 % Apply 1 application topically 2 (two) times daily. 30 g 1   clonazePAM  (KLONOPIN ) 0.5 MG tablet TAKE 1/2 TABLET BY MOUTH DAILY 15 tablet 0   Continuous Glucose Receiver (FREESTYLE LIBRE 2 READER) DEVI 1 each by Does not apply route daily. 1 each 0   Continuous Glucose Sensor (FREESTYLE LIBRE 14 DAY SENSOR)  MISC Use for continuous glucose monitoring. 6 each 3   Digestive Enzymes (ENZYME DIGEST PO) Take by mouth.     estradiol  (ESTRACE  VAGINAL) 0.1 MG/GM vaginal cream Place 1 Applicatorful vaginally at bedtime. Use 3 times a week 42.5 g 12   fexofenadine  (ALLEGRA ) 180 MG tablet Take 180 mg by mouth daily.     fluvastatin  XL (LESCOL  XL) 80 MG 24 hr tablet TAKE 1 TABLET(80 MG) BY MOUTH DAILY 90 tablet 3   glucose blood (ONETOUCH ULTRA) test strip Use to check blood sugar once a day. 100 strip 11   glucose blood (ONETOUCH VERIO) test strip Use as instructed to check blood sugar 4X daily 300 each 2   Insulin  Aspart FlexPen (NOVOLOG ) 100 UNIT/ML ADMINISTER 10 TO 30 UNITS UNDER THE SKIN THREE TIMES DAILY BEFORE MEALS 90 mL 1   Insulin  Pen Needle (BD PEN NEEDLE NANO 2ND GEN) 32G X 4 MM MISC USE 4 TIMES DAILY 400 each 3   irbesartan  (AVAPRO ) 75 MG tablet TAKE 1 TABLET(75 MG) BY MOUTH DAILY 90 tablet 3   LANTUS  SOLOSTAR 100 UNIT/ML Solostar Pen Inject 20 Units into the skin daily. 30 mL 3   levothyroxine  (SYNTHROID ) 100 MCG tablet TAKE 1 TABLET(100 MCG) BY MOUTH DAILY 90 tablet 3   methocarbamol  (ROBAXIN ) 500 MG tablet TAKE 1 TABLET(500 MG) BY MOUTH EVERY 8 HOURS AS NEEDED FOR MUSCLE SPASMS 60 tablet 1   NOVOLOG  FLEXPEN 100 UNIT/ML FlexPen Inject 10-30 Units into the  skin 3 (three) times daily with meals. 90 mL 0   Olopatadine HCl (PATADAY OP) Apply to eye.     OVER THE COUNTER MEDICATION 1 capsule daily. Probiotic renew life      sertraline  (ZOLOFT ) 100 MG tablet TAKE 2 TABLETS BY MOUTH EVERY DAY 180 tablet 0   albuterol  (VENTOLIN  HFA) 108 (90 Base) MCG/ACT inhaler INHALE 2 PUFFS BY MOUTH EVERY 4 TO 6 HOURS AS DIRECTED(RESCUE) 18 g 0   No current facility-administered medications for this visit.    Allergies: Latex, Fluticasone , Metformin  and related, and Pravastatin   Past Medical History:  Diagnosis Date   Abnormal Papanicolaou smear of cervix with positive human papilloma virus (HPV) test 02/2017    Anxiety    Asthma    Depression    Diabetes mellitus type II    Food intolerance    Headache    High blood pressure    IBS (irritable bowel syndrome)    Insomnia    Migraines    PCOS (polycystic ovarian syndrome)    PTSD (post-traumatic stress disorder)    Seasonal allergies    Thyroid  disease     Past Surgical History:  Procedure Laterality Date   CHOLECYSTECTOMY     GALLBLADDER SURGERY      Family History  Problem Relation Age of Onset   Bipolar disorder Mother    Hypertension Mother    Diabetes Mother    Alzheimer's disease Father    Hypertension Father    Alzheimer's disease Maternal Grandmother    Heart disease Paternal Grandfather     Social History   Tobacco Use   Smoking status: Never   Smokeless tobacco: Never  Substance Use Topics   Alcohol use: Yes    Alcohol/week: 1.0 standard drink of alcohol    Types: 1 Standard drinks or equivalent per week    Comment: 1 drink a month    Subjective:   Patient would like to discuss requested referrals- Had called before Christmas asking to go to cardiology- Dr. Delford; she is again asking for cardiology referral today;  Is concerned that she may have had a TIA in the past 2 weeks- denies any numbness/ tingling- just felt weakness all over for a while on a certain day; interested in having updated CT of brain;  She is asking for referrals to PT and OT due to chronic problems with neck pain/ carpal tunnel;  She is coughing/ wheezing in office today- is adamant that she cannot and will not take any type of oral or inhaled steroid or antibiotic; agreeable to CXR; requesting refill on albuterol  inhaler;   Patient does note that she has not slept in 3 days- denies any diagnosis of bipolar; readily admits she is taking a lot of supplements including one with CBD; she notes she reached out to her psychiatrist yesterday because she felt like she needed to be tapered off her Zoloft .    Objective:  Vitals:   09/24/23 0925   BP: 136/84  Pulse: 62  SpO2: 96%  Weight: 173 lb 3.2 oz (78.6 kg)  Height: 5' (1.524 m)    General: Well developed, well nourished, in no acute distress  Skin : Warm and dry.  Head: Normocephalic and atraumatic  Eyes: Sclera and conjunctiva clear; pupils round and reactive to light; extraocular movements intact  Ears: External normal; canals clear; tympanic membranes normal  Oropharynx: Pink, supple. No suspicious lesions  Neck: Supple without thyromegaly, adenopathy  Lungs: Respirations unlabored; coarse breath sounds in all  4 lobes CVS exam: normal rate and regular rhythm.  Neurologic: Alert and oriented; speech intact; face symmetrical; moves all extremities well; CNII-XII intact without focal deficit   Assessment:  1. Weakness   2. Neck pain   3. Carpal tunnel syndrome, bilateral   4. Cough, unspecified type   5. Dysuria     Plan:  Patient's behavior in the office is consistent with manic type behavior. She is not suicidal or homicidal and does not appear to have immediate risk of death so could not keep her in the office. I did reach out to her psychiatrist to let him know about behavior and see if his office has any resources to help her acutely. At time of writing this note, it does appear that she has a visit with her therapist today at 1 pm; will monitor this very closely- if patient is not seen by her therapist today, will reach back out to her today to discuss options for management of symptoms.   Regarding cough, she is agreeable to CXR and refill on albuterol ; refuses any type of steroid or antibiotic; she notes she will reach out to her acupuncturist.   Will update head CT due to patient concern for recent TIA; referral to PT and OT per patient request as well;   No follow-ups on file.  Orders Placed This Encounter  Procedures   Urine Culture   CT HEAD WO CONTRAST ( )    Standing Status:   Future    Expiration Date:   09/23/2024    Is patient pregnant?:   No     Preferred imaging location?:   [facility]   DG Chest 2 View    Standing Status:   Future    Number of Occurrences:   1    Expiration Date:   09/23/2024    Reason for Exam (SYMPTOM  OR DIAGNOSIS REQUIRED):   cough/ wheezing    Is patient pregnant?:   No    Preferred imaging location?:   [facility]   Ambulatory referral to Physical Therapy    Referral Priority:   Routine    Referral Type:   Physical Medicine    Referral Reason:   Specialty Services Required    Requested Specialty:   Physical Therapy    Number of Visits Requested:   1   Ambulatory referral to Occupational Therapy    Referral Priority:   Routine    Referral Type:   Occupational Therapy    Referral Reason:   Specialty Services Required    Requested Specialty:   Occupational Therapy    Number of Visits Requested:   1   POCT Urinalysis Dipstick    Requested Prescriptions   Signed Prescriptions Disp Refills   albuterol  (VENTOLIN  HFA) 108 (90 Base) MCG/ACT inhaler 18 g 0    Sig: INHALE 2 PUFFS BY MOUTH EVERY 4 TO 6 HOURS AS DIRECTED(RESCUE)

## 2023-09-25 ENCOUNTER — Encounter (HOSPITAL_COMMUNITY): Payer: Self-pay

## 2023-09-25 NOTE — Telephone Encounter (Signed)
 Spoke with pt, pt states states she is aware that Dr. Geralene would like for her to schedule a follow up appointment, pt states she has cancelled everything due to her just not feeling well. Pt states she was finally able to get some sleep last night after not sleeping for 3 days. Pt states she prefers to stay home and get some more rest before going to any further appointments. Advised pt to give Dr. Babetta office a call to go ahead and get appointment scheduled and also to please reach back out if she has any further questions or concerns.

## 2023-09-26 LAB — URINE CULTURE
MICRO NUMBER:: 15927184
SPECIMEN QUALITY:: ADEQUATE

## 2023-09-27 ENCOUNTER — Ambulatory Visit: Payer: PPO | Admitting: Physical Therapy

## 2023-09-27 ENCOUNTER — Other Ambulatory Visit: Payer: Self-pay | Admitting: Family

## 2023-09-27 ENCOUNTER — Ambulatory Visit: Payer: PPO | Admitting: Occupational Therapy

## 2023-09-27 MED ORDER — SULFAMETHOXAZOLE-TRIMETHOPRIM 800-160 MG PO TABS: 1.0000 | ORAL_TABLET | Freq: Two times a day (BID) | ORAL | 0 refills | Status: AC

## 2023-10-02 ENCOUNTER — Ambulatory Visit: Payer: PPO

## 2023-10-02 ENCOUNTER — Encounter: Payer: PPO | Admitting: Occupational Therapy

## 2023-10-02 DIAGNOSIS — Z1211 Encounter for screening for malignant neoplasm of colon: Secondary | ICD-10-CM | POA: Diagnosis not present

## 2023-10-07 LAB — COLOGUARD: COLOGUARD: NEGATIVE

## 2023-10-08 ENCOUNTER — Encounter: Payer: Self-pay | Admitting: Family

## 2023-10-08 ENCOUNTER — Ambulatory Visit: Payer: PPO | Admitting: Psychology

## 2023-10-08 DIAGNOSIS — F3341 Major depressive disorder, recurrent, in partial remission: Secondary | ICD-10-CM
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
## 2023-10-10 ENCOUNTER — Ambulatory Visit (HOSPITAL_COMMUNITY): Payer: PPO | Admitting: Psychiatry

## 2023-10-10 DIAGNOSIS — L309 Dermatitis, unspecified: Secondary | ICD-10-CM | POA: Diagnosis not present

## 2023-10-10 DIAGNOSIS — L810 Postinflammatory hyperpigmentation: Secondary | ICD-10-CM | POA: Diagnosis not present

## 2023-10-14 ENCOUNTER — Ambulatory Visit
Admission: RE | Admit: 2023-10-14 | Discharge: 2023-10-14 | Disposition: A | Discharge status: Home / Self Care | Payer: PPO | Source: Ambulatory Visit | Attending: Family | Admitting: Family

## 2023-10-14 DIAGNOSIS — R531 Weakness: Secondary | ICD-10-CM

## 2023-10-22 ENCOUNTER — Ambulatory Visit: Payer: PPO | Admitting: Psychology

## 2023-10-22 DIAGNOSIS — F3341 Major depressive disorder, recurrent, in partial remission: Secondary | ICD-10-CM | POA: Diagnosis not present

## 2023-10-22 DIAGNOSIS — F411 Generalized anxiety disorder: Secondary | ICD-10-CM

## 2023-10-22 NOTE — Progress Notes (Signed)
 [facility] Counselor/Therapist Progress Note  Patient ID: [name], MRN: [id number]    Date: 10/22/23  Time Spent: 1:07 pm -  1:59 pm : 52 Minutes  Treatment Type: Individual Therapy.  Reported Symptoms: Depression and Anxiety.   Mental Status Exam: Appearance:  Casual     Behavior: Appropriate  Motor: Normal  Speech/Language:  Clear and Coherent  Affect: Appropriate  Mood: dysthymic  Thought process: normal  Thought content:   WNL  Sensory/Perceptual disturbances:   WNL  Orientation: oriented to person, place, time/date, and situation  Attention: Good  Concentration: Good  Memory: WNL  Fund of knowledge:  Good  Insight:   Good  Judgment:  Good  Impulse Control: Good   Risk Assessment: Danger to Self:  No Self-injurious Behavior: No Danger to Others: No Duty to Warn:no Physical Aggression / Violence:No  Access to Firearms a concern: No  Gang Involvement:No   Subjective:   [name] participated in the session from  the office, and consented totreatment with the therapist. Shawna noted the events of the past week.Ezme noted a need to register at the [facility] and update her membership to her senior center. We worked on identifying barriers to this and ways to manage this. She noted a recent financial stressors due to a a check not clearing. She noted anxiety about this an a misunderstanding with her father regarding finances but noted resolving this since that time. She noted increased lethargy and could not identify a cause for this. She noted speaking with her psychiatric provider regarding this and possible medication changes. She noted worry about her pets due to their aging and becoming more territorial. She noted that they can snarl at one another and noted this causing her distress. We worked on identifying ways to address this and work being preemptive with addressing this issue in a reasonable way. Therapist differentiated between acting on  anxious thoughts vs.issues that have a significant measurable effect on day-to-day life. She disclosed being bisexual and noted a need for further process this, going forward. She noted her most recent relationship, with Redell, was supportive and she noted feeling like she had backup. She noted her relationship with her parents was conditional. We will continue to explore this going forward. Therapist validated Veleka's feelings and experience during the session. Livian expressed commitment towards treatment goals. Therapist praised Jamese and provided supportive therapy. A follow-up was scheduled for continued treatment, which she benefits from.   Interventions: CBT  Diagnosis:    Major depressive disorder, recurrent episode, in partial remission (HCC)  GAD (generalized anxiety disorder)  Psychiatric Treatment: Yes , Dr. Laurann.  See chart.   Treatment Plan:  Client Abilities/Strengths [name] is intelligent, well spoken, and motivated for change.   Support System: Friends from church.   Client Treatment Preferences OPT  Client Statement of Needs [name] would like to increase motivation and task, becoming more social and date, managing overall mood, processing past events, bolstering coping skills, managing overall symptoms, & increasing mindfulness.     Treatment Level Weekly  Symptoms  Depression:  Loss of interest, feeling down, poor sleep, lethargy, poor appetite, difficulty concentrating  (Status: maintained) Anxiety: feeling nervous, worrying about different things, trouble relaxing, irritability, feeling afraid as if something awful might happen, anxiety leaving the home.   (Status: maintained)  Goals:   [name] experiences symptoms of anxiety, depression, and PTSD.   Target Date: 10/23/23 Frequency: Weekly  Progress: 0 Modality: individual    Therapist will  provide referrals for additional resources as appropriate.  Therapist will provide psycho-education regarding [name]'s  diagnosis and corresponding treatment approaches and interventions. Licensed Clinical Social Worker, [place], [name] will support the patient's ability to achieve the goals identified. will employ CBT, BA, Problem-solving, Solution Focused, Mindfulness,  coping skills, & other evidenced-based practices will be used to promote progress towards healthy functioning to help manage decrease symptoms associated with her diagnosis.   Reduce overall level, frequency, and intensity of the feelings of depression & anxiety evidenced by decreased overall symptoms from 6 to 7 days/week to 0 to 1 days/week per client report for at least 3 consecutive months. Verbally express understanding of the relationship between feelings of depression, anxiety and their impact on thinking patterns and behaviors. Verbalize an understanding of the role that distorted thinking plays in creating fears, excessive worry, and ruminations.  [name] participated in the creation of the treatment plan)    [name], [name]

## 2023-10-24 ENCOUNTER — Other Ambulatory Visit: Payer: Self-pay

## 2023-10-24 ENCOUNTER — Ambulatory Visit: Payer: PPO | Admitting: Occupational Therapy

## 2023-10-24 ENCOUNTER — Encounter: Payer: Self-pay | Admitting: Family

## 2023-10-24 ENCOUNTER — Ambulatory Visit: Payer: PPO | Attending: Family

## 2023-10-24 ENCOUNTER — Encounter: Payer: Self-pay | Admitting: Occupational Therapy

## 2023-10-24 DIAGNOSIS — M79641 Pain in right hand: Secondary | ICD-10-CM | POA: Diagnosis not present

## 2023-10-24 DIAGNOSIS — R278 Other lack of coordination: Secondary | ICD-10-CM | POA: Diagnosis not present

## 2023-10-24 DIAGNOSIS — M79642 Pain in left hand: Secondary | ICD-10-CM | POA: Insufficient documentation

## 2023-10-24 DIAGNOSIS — M542 Cervicalgia: Secondary | ICD-10-CM | POA: Diagnosis not present

## 2023-10-24 DIAGNOSIS — M6281 Muscle weakness (generalized): Secondary | ICD-10-CM | POA: Insufficient documentation

## 2023-10-24 NOTE — Therapy (Signed)
 OUTPATIENT OCCUPATIONAL THERAPY ORTHO EVALUATION  Patient Name: [name] MRN: [id number] DOB:May 05, 1966, 58 y.o., female Today's Date: 10/24/2023  PCP: Jason Leita Repine, FNP REFERRING PROVIDER: Jason Leita Repine, FNP  END OF SESSION:  OT End of Session - 10/24/23 1736     Visit Number 1    Number of Visits 6    Date for OT Re-Evaluation 11/29/23    Authorization Type [employer] 2025 Auth Not Reqd VL: MN    OT Start Time 1105    OT Stop Time 1150    OT Time Calculation (min) 45 min    Equipment Utilized During Treatment Testing Material    Activity Tolerance Patient tolerated treatment well    Behavior During Therapy WFL for tasks assessed/performed             Past Medical History:  Diagnosis Date   Abnormal Papanicolaou smear of cervix with positive human papilloma virus (HPV) test 02/2017   Anxiety    Asthma    Depression    Diabetes mellitus type II    Food intolerance    Headache    High blood pressure    IBS (irritable bowel syndrome)    Insomnia    Migraines    PCOS (polycystic ovarian syndrome)    PTSD (post-traumatic stress disorder)    Seasonal allergies    Thyroid  disease    Past Surgical History:  Procedure Laterality Date   CHOLECYSTECTOMY     GALLBLADDER SURGERY     Patient Active Problem List   Diagnosis Date Noted   Class 1 obesity 12/22/2019   ASCUS with positive high risk HPV cervical 03/13/2017   Type 2 diabetes mellitus with hyperglycemia, without long-term current use of insulin  (HCC) 03/28/2016   Routine general medical examination at a health care facility 01/24/2016   Acquired hypothyroidism 01/24/2016   Anemia 04/14/2014   Hyperlipidemia 08/18/2013   Essential hypertension 08/26/2012   Irregular menses 08/07/2012   History of abnormal Pap smear 08/07/2012   GERD (gastroesophageal reflux disease) 08/07/2012   Asthma, mild persistent 08/07/2012   Migraine 08/07/2012   History of PCOS 08/07/2012   Genital  warts 08/07/2012   S/P cholecystectomy 08/07/2012   Seasonal and perennial allergic rhinitis 08/07/2012   Severe episode of recurrent major depressive disorder (HCC) 12/06/2011   PTSD (post-traumatic stress disorder) 11/02/2011    ONSET DATE: 09/24/2023  REFERRING DIAG: G56.03 (ICD-10-CM) - Carpal tunnel syndrome, bilateral Reason for Referral: bilateral hand/ wrist pain  THERAPY DIAG:  Muscle weakness (generalized)  Other lack of coordination  Pain in right hand  Pain in left hand  Rationale for Evaluation and Treatment: Rehabilitation  SUBJECTIVE:   SUBJECTIVE STATEMENT: Named pronounced SUH-ra  Pt reported she first started having trouble around 21-24 yo - 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  Then it bothered her again in her 30s and she was having trouble with holding her toothbrush etc and pt reported that OT that helped a lot with that.    When asked about most recent issues, pt reports that she has noticed a lessening of grip strength over the last couple of years.  She is concerned that I was losing strength and not doing anything about it.  Pt reports that she has made some adaptations ie) finger loop on phone  and has holders by bed and by the sofa to hold phone.  She also recently bought a rollator when she was having trouble doing anything a few months ago and is waiting on a lift chair to be delivered.  Pt accompanied by: self  PERTINENT HISTORY:  Pt reports long history of issues on and off over the years related to carpal tunnel symptoms but no surgical intervention.  PMHx: Migraines, essential HTN, asthma, GERN, DM2, PTSD, depression  PRECAUTIONS: Other: Diabetic - FreeStyle Libre on arm  LATEX allergy   RED FLAGS: None   WEIGHT BEARING RESTRICTIONS: No  PAIN:   Are you having pain? No  FALLS: Has patient fallen in last 6 months? No - but stumbles, catching herself on something before falling ~ maybe a couple of times/month  LIVING ENVIRONMENT: Lives with: lives alone - 2 dogs and cat; has tenants that rent rooms from her - share kitchen but separate bathrooms etc. Lives in: House Stairs: Yes: External: 3 steps; can reach both Has following equipment at home: Walker - 4 wheeled and has ordered a lift chair  PLOF: Independent - drives but not working/volunteering  PATIENT GOALS: Regain as much strength in my hands as possible  NEXT MD VISIT: NA   OBJECTIVE:  Note: Objective measures were completed at Evaluation unless otherwise noted.  HAND DOMINANCE: Right  ADLs: WFL  FUNCTIONAL OUTCOME MEASURES: Quick Dash: 11.4  UPPER EXTREMITY ROM:    Generally full ROM of BUEs, slight end range limitations  but full composite flexion.  UPPER EXTREMITY MMT:     MMT Right eval Left eval  Shoulder flexion 3+ 3  Shoulder abduction    Shoulder adduction    Shoulder extension    Shoulder internal rotation    Shoulder external rotation    Middle trapezius    Lower trapezius    Elbow flexion    Elbow extension    Wrist flexion    Wrist extension    Wrist ulnar deviation    Wrist radial deviation    Wrist pronation    Wrist supination    (Blank rows = not tested)  HAND FUNCTION: Grip strength: Right: 40, 39, 40  lbs; Left: 35, 30, 27 lbs,  Average: Right 39.7 lbs Left 30.7 lbs  Lateral pinch: Right: 12 lbs, Left: 13  lbs,  3 point pinch: Right: 12 lbs, Left: 10 lbs,  Tip pinch: Right 10 lbs, Left: 10 lbs  COORDINATION: 9 Hole Peg test: Right: 22.78  sec; Left: 27.33 sec -  OTR noticed some tremors during FM testing.  Pt confirms issues and reports she think its worse on L side which seemed to fit with what was observed.  Pt notes its been there a couple of years and it's worse if she hasn't eaten etc.  SENSATION: WFL  EDEMA:  NA  COGNITION: Overall cognitive status: Within functional limits for tasks assessed and No family/caregiver present to determine baseline cognitive functioning Areas of impairment:   OBSERVATIONS: Pt ambulated with no AE and loss of balance. The pt appears to have tremors in BUE L>R and is somewhat hypotonic in appearance.  TREATMENT DATE: 10/24/23  Pt issued tendon gliding exercises/handout with review of motions to isolate DIP, PIP and MCP joints for straight finger position, hook (DIP/PIP flexion), fist (DIP/PIP/MCP flexion), taco/duck (MCP flexion only) and flat fist (MCP and PIP flexion).   PATIENT EDUCATION: Education details: OT role, POC considerations and tendon gliding exercises  Person educated: Patient Education method: Explanation, Demonstration, Tactile cues, Verbal cues, and Handouts Education comprehension: verbalized understanding, returned demonstration, verbal cues required, tactile cues required, and needs further education  HOME EXERCISE PROGRAM: 10/24/23: Tendon Gliding Exercises  GOALS: Goals reviewed with patient? Yes  LONG TERM GOALS: Target date: 11/29/23  Patient will demonstrate updated B UE HEP with visual handouts only for proper execution.  Baseline: New to outpt OT Goal status: INITIAL  2.  Patient will demonstrate at least 5+ lbs improvement in B grip strength as needed to open jars and other containers. Baseline: Average: Right 39.7 lbs Left 30.7 lbs Goal status: INITIAL  3.  Pt will independently recall at least 3 joint protection, ergonomics, and body mechanic principles as noted in pt instructions.   Baseline: New to outpt OT Goal status: INITIAL  4.  PT will demonstrate B coordination, FM skills and activity tolerance to resume hobby r/t making jewelry/bead weaving 3+ times/week x 15+ minutes. Baseline: NA - frequently in  bed per report to PT  Goal status: INITIAL  ASSESSMENT:  CLINICAL IMPRESSION: Patient is a 58 y.o. female who was seen today for occupational therapy evaluation for B carpal tunnel syndrome/wrist/hand pain. Hx includes migraines, essential HTN, asthma, GERN, DM2, PTSD, and depression. Patient currently presents with some general UE weakness and decreased participation in hobby tasks. Pt would benefit from skilled OT services in the outpatient setting to work on impairments as noted below to help pt return to PLOF as able.    PERFORMANCE DEFICITS: in functional skills including ADLs, coordination, dexterity, edema, tone, ROM, strength, pain, Fine motor control, Gross motor control, endurance, decreased knowledge of use of DME, and UE functional use, cognitive skills including attention, emotional, and temperament/personality, and psychosocial skills including coping strategies, environmental adaptation, habits, and routines and behaviors.   IMPAIRMENTS: are limiting patient from ADLs, IADLs, and leisure.   COMORBIDITIES: may have co-morbidities  that affects occupational performance. Patient will benefit from skilled OT to address above impairments and improve overall function.  MODIFICATION OR ASSISTANCE TO COMPLETE EVALUATION: No modification of tasks or assist necessary to complete an evaluation.  OT OCCUPATIONAL PROFILE AND HISTORY: Problem focused assessment: Including review of records relating to presenting problem.  CLINICAL DECISION MAKING: LOW - limited treatment options, no task modification necessary  REHAB POTENTIAL: Good  EVALUATION COMPLEXITY: Low      PLAN:  OT FREQUENCY: 1x/week  OT DURATION: up 6 weeks - just scheduled for 4 weeks at this time  PLANNED INTERVENTIONS: 97535 self care/ADL training, 02889 therapeutic exercise, 97530 therapeutic activity, 97112 neuromuscular re-education, 97140 manual therapy, 97035 ultrasound, 97018 paraffin, 02960 fluidotherapy, 97010  moist heat, 97760 Orthotics management and training, 02239 Splinting (initial encounter), [id number] Subsequent splinting/medication, passive range of motion, energy conservation, coping strategies training, patient/family education, and DME and/or AE instructions  RECOMMENDED OTHER SERVICES: NA - pt received PT evaluation today for neck pain  CONSULTED AND AGREED WITH PLAN OF CARE: Patient  PLAN FOR NEXT SESSION:  Review Tendon Gliding Exercises Add putty/theraband HEP as able Coordination activities for [facility] Joint protection education   Clarita LITTIE Pride, OT 10/24/2023, 5:40 PM

## 2023-10-24 NOTE — Therapy (Signed)
 OUTPATIENT PHYSICAL THERAPY CERVICAL EVALUATION   Patient Name: [name] MRN: [id number] DOB:1965/12/29, 58 y.o., female Today's Date: 10/24/2023  END OF SESSION:  PT End of Session - 10/24/23 1351     Visit Number 1    Number of Visits 9    Date for PT Re-Evaluation 11/21/23    Authorization Type HTA, no auth required, VL: MN    PT Start Time 1020    PT Stop Time 1105    PT Time Calculation (min) 45 min    Activity Tolerance Patient tolerated treatment well    Behavior During Therapy WFL for tasks assessed/performed             Past Medical History:  Diagnosis Date   Abnormal Papanicolaou smear of cervix with positive human papilloma virus (HPV) test 02/2017   Anxiety    Asthma    Depression    Diabetes mellitus type II    Food intolerance    Headache    High blood pressure    IBS (irritable bowel syndrome)    Insomnia    Migraines    PCOS (polycystic ovarian syndrome)    PTSD (post-traumatic stress disorder)    Seasonal allergies    Thyroid  disease    Past Surgical History:  Procedure Laterality Date   CHOLECYSTECTOMY     GALLBLADDER SURGERY     Patient Active Problem List   Diagnosis Date Noted   Class 1 obesity 12/22/2019   ASCUS with positive high risk HPV cervical 03/13/2017   Type 2 diabetes mellitus with hyperglycemia, without long-term current use of insulin  (HCC) 03/28/2016   Routine general medical examination at a health care facility 01/24/2016   Acquired hypothyroidism 01/24/2016   Anemia 04/14/2014   Hyperlipidemia 08/18/2013   Essential hypertension 08/26/2012   Irregular menses 08/07/2012   History of abnormal Pap smear 08/07/2012   GERD (gastroesophageal reflux disease) 08/07/2012   Asthma, mild persistent 08/07/2012   Migraine 08/07/2012   History of PCOS 08/07/2012   Genital warts 08/07/2012   S/P cholecystectomy 08/07/2012   Seasonal and perennial allergic rhinitis 08/07/2012   Severe episode of recurrent major depressive  disorder (HCC) 12/06/2011   PTSD (post-traumatic stress disorder) 11/02/2011    PCP: Leita Elbe, FNP  REFERRING PROVIDER: Elbe Leita Repine, FNP  REFERRING DIAG: M54.2 (ICD-10-CM) - Neck pain  THERAPY DIAG:  Neck pain  Rationale for Evaluation and Treatment: Rehabilitation  ONSET DATE: 09/24/2023- date of referral  SUBJECTIVE:  SUBJECTIVE STATEMENT: Pt reports of intermittent neck pain that has been going on since she was 59 year old. Currently, it started about 2 months ago. She experience neck pain may be 2 days out of a week. Pain at worst is 6/10. Heat and rest helps to alleviate her symptoms. She also has insomnia and may be sleeps 4 hours on good days. On average she is getting 2-3 hours of sleep max at night. Neck pain doesn't wake her up. She doesn't wake up with neck pain. It usually starts in the day and goes away. Hand dominance: Right  Typical day routine: falls asleep around 3 am , wakes up around 7:30am feeding the animals, back in bed by 9 am until 11am, 12pm, makes a small meal and spends time with her animals; tries to fall sleep around 4pm and 6pm; then she eats her dinner. She eats at least 2 meals a day. Then she is back in bed  around 8 pm and lays in bed, reads, or just lays there.   PERTINENT HISTORY:  Anxiety, DM II, PTSD, HTN, depression  PAIN:  Are you having pain? Yes: NPRS scale: 6/10 neck pain Pain location: L upper trap/levator scapulae region Pain description: dull ache Aggravating factors: R of neck Relieving factors: heat, rest  PRECAUTIONS: None  RED FLAGS: None     WEIGHT BEARING RESTRICTIONS: No  FALLS:  Has patient fallen in last 6 months? 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  DIAGNOSTIC FINDINGS:  none  PATIENT SURVEYS:  NDI 22% (Changed sleep score from 5 to 1 as her sleep is not disturbed due to neck pain  COGNITION: Overall cognitive status: Within functional limits for tasks assessed  SPOSTURE: rounded shoulders and forward head    CERVICAL ROM:   Active ROM A/PROM (deg) eval  Flexion 35  Extension 60 deg  Right lateral flexion   Left lateral flexion   Right rotation 55 deg  Left rotation 70 deg   (Blank rows = not tested)  UPPER EXTREMITY ROM: WFL with screen of ROM  Active ROM Right eval Left eval  Shoulder flexion    Shoulder extension    Shoulder abduction    Shoulder adduction    Shoulder extension    Shoulder internal rotation    Shoulder external rotation    Elbow flexion    Elbow extension    Wrist flexion    Wrist extension    Wrist ulnar deviation    Wrist radial deviation    Wrist pronation    Wrist supination     (Blank rows = not tested)  UPPER EXTREMITY MMT:  MMT Right eval Left eval  Shoulder flexion 5 5  Shoulder extension    Shoulder abduction 5 5  Shoulder adduction    Shoulder extension    Shoulder internal rotation 5 5  Shoulder external rotation 5 5  Middle trapezius    Lower trapezius    Elbow flexion 5 5  Elbow extension 5 5  Wrist flexion    Wrist extension    Wrist ulnar deviation    Wrist radial deviation    Wrist pronation    Wrist supination    Grip strength     (Blank rows = not tested)   TREATMENT DATE:  10/24/23 Reviewed evaluation findings and discussed plan of care patient. Pt educated on adjusting frequency and intensity of exercises to make sure they are pain free Following exercises performed with patient while modifying range of motion to be  without pain Cervical flexion, extension, bil cervical rotation: 10x in each direction We discussed importance of good night sleep and discussing with her doctors to help her with improved sleeping. Pt educated that she can potentially try sleeping less throughout the day to see if she would be able to sleep longer throughout the night to get 6-8 hours of sleep I possible to allow body/mind to rest/heal as lack of proper sleep can increase pain/muscle soreness. Discussed sleeping positions and avoiding sleeping on the stomach. Pt prefers sidelying and supine positions anyhow. Pt educated that she most likely doesn't have pinched nerve as no radiologic assessment wasperformed previously to assess for it and she is not experiencing any radicular symptoms. Her symptoms may be due to stiffness in neck due to lack of rull ROM and we discussed HEP management that will address gradually working to her full potential of cervical ROM to alleviate and improve her neck pain  PATIENT EDUCATION:  Education details: see above Person educated: Patient Education method: Medical Illustrator Education comprehension: verbalized understanding  HOME EXERCISE PROGRAM: Access Code: [email or web address] URL: [email or web address] Date: 10/24/2023 Prepared by: Raj Blanch  Exercises - Seated Cervical Flexion AROM  - 2 x daily - 7 x weekly - 10 reps - Seated Cervical Extension AROM  - 2 x daily - 7 x weekly - 10 reps - Seated Cervical Rotation AROM  - 2 x daily - 7 x weekly - 10 reps  ASSESSMENT:  CLINICAL IMPRESSION: Patient is a 58 y.o. female who was seen today for physical therapy evaluation and treatment for chronic intermittent neck pain. Patient demonstrates mild impairment with left rotation and cervical flexion due to muscle spasms and tightness in neck musculature. Pt demonstrates WNL of bil shoulder/elbow ROM and strength. Patient will benefit from small bout of therapy to establish pain  management strategies with patient education and home exercise program to improve self management. .   OBJECTIVE IMPAIRMENTS: decreased ROM, hypomobility, increased fascial restrictions, increased muscle spasms, impaired flexibility, postural dysfunction, and pain.   ACTIVITY LIMITATIONS:  none  PARTICIPATION LIMITATIONS:  none  PERSONAL FACTORS: Time since onset of injury/illness/exacerbation are also affecting patient's functional outcome.   REHAB POTENTIAL: Good  CLINICAL DECISION MAKING: Stable/uncomplicated  EVALUATION COMPLEXITY: Low   GOALS: Goals reviewed with patient? Yes  SHORT TERM GOALS =  long term goals   LONG TERM GOALS: Target date: 11/21/2023    Pt will demo 70 deg of of cervical rotation bil to improve ability to turn her head. Baseline: 55 deg rotation to R and 70 deg to L (eval) Goal status: INITIAL  2.  Patient will report of improved neck pain frequency to <4/month to improve function; Baseline: 8 days/month Goal status: INITIAL  3.  Pt will improve 10% on NDI to improve neck pain and ability to self manage. Baseline: 22% (10/24/23) Goal status: INITIAL    PLAN:  PT FREQUENCY: 2x/week  PT DURATION: 4 weeks  PLANNED INTERVENTIONS: 97164- PT Re-evaluation, 97110-Therapeutic exercises, 97530- Therapeutic activity, 97112- Neuromuscular re-education, 97535- Self Care, 02859- Manual therapy, 97014- Electrical stimulation (unattended), 97012- Traction (mechanical), Patient/Family education, Dry Needling, Joint mobilization, Spinal mobilization, Cryotherapy, and Moist heat  PLAN FOR NEXT SESSION: Review HEP   Raj LOISE Blanch, PT 10/24/2023, 1:52 PM

## 2023-10-28 ENCOUNTER — Telehealth (HOSPITAL_COMMUNITY): Payer: Self-pay | Admitting: *Deleted

## 2023-10-28 MED ORDER — CLONAZEPAM 0.5 MG PO TABS: 0.2500 mg | ORAL_TABLET | Freq: Every day | ORAL | 0 refills | Status: DC

## 2023-10-28 NOTE — Addendum Note (Signed)
 Addended by: Tighe Gitto on: 10/28/2023 01:21 PM   Modules accepted: Orders

## 2023-10-28 NOTE — Telephone Encounter (Signed)
 Refill Request  [id number] - [place], [place] - [address]     clonazePAM  (KLONOPIN ) 0.5 MG tablet   Next Appt  10/31/23 Last Appt  06/25/23

## 2023-10-29 ENCOUNTER — Encounter: Payer: Self-pay | Admitting: Physical Therapy

## 2023-10-29 ENCOUNTER — Ambulatory Visit: Payer: PPO | Admitting: Physical Therapy

## 2023-10-29 ENCOUNTER — Encounter: Payer: Self-pay | Admitting: Family

## 2023-10-29 ENCOUNTER — Ambulatory Visit: Payer: PPO | Admitting: Occupational Therapy

## 2023-10-29 VITALS — HR 50

## 2023-10-29 DIAGNOSIS — M542 Cervicalgia: Secondary | ICD-10-CM | POA: Diagnosis not present

## 2023-10-29 DIAGNOSIS — M79641 Pain in right hand: Secondary | ICD-10-CM

## 2023-10-29 DIAGNOSIS — M6281 Muscle weakness (generalized): Secondary | ICD-10-CM

## 2023-10-29 DIAGNOSIS — R278 Other lack of coordination: Secondary | ICD-10-CM

## 2023-10-29 DIAGNOSIS — M79642 Pain in left hand: Secondary | ICD-10-CM

## 2023-10-29 NOTE — Therapy (Signed)
[facility] [address] [place], [place], [address] Phone: [phone]   Fax:  [phone]  Patient Details  Name: [name] MRN: [id number] Date of Birth: 1965-11-03  Pt Arrived, No charge:  PT updated OT on low pulse rate during PT session. OT assessed pt's pulse rate and spO2 at beginning of OT session, see vital signs. Pulse rate noted to be low though pt asymptomatic. Pt reported not eating today d/t high blood sugar this morning and preference to wait to eat until hungry. Pt reported low water intake today.   OT provided pt with water and educated pt on importance of hydration. Pt verbalized understanding. Vital signs assessed several minutes later with similarly low pulse rate (see vital signs) though pt continued to deny symptoms.  OT recommended to pt to f/u with MD regarding pulse rate. OT to postpone session today. Pt agreeable to postponing session and also expressed concerns that HR was "below 52." Per PT, PT contacted pt's MD regarding low pulse rate concerns.   Wynetta Emery, OT 10/29/2023, 10:44 AM  [facility] [address] [place], [place], [address] Phone: [phone]   Fax:  [phone]

## 2023-10-29 NOTE — Therapy (Signed)
OUTPATIENT PHYSICAL THERAPY CERVICAL TREATMENT   Patient Name: [name] MRN: [id number] DOB:02-23-1966, 58 y.o., female Today's Date: 10/29/2023  END OF SESSION:  PT End of Session - 10/29/23 0934     Visit Number 2    Number of Visits 9    Date for PT Re-Evaluation 11/21/23    Authorization Type HTA, no auth required, VL: [place]    PT Start Time 0934    PT Stop Time 1014    PT Time Calculation (min) 40 min    Activity Tolerance Patient tolerated treatment well    Behavior During Therapy [facility] for tasks assessed/performed;Anxious             Past Medical History:  Diagnosis Date   Abnormal Papanicolaou smear of cervix with positive human papilloma virus (HPV) test 02/2017   Anxiety    Asthma    Depression    Diabetes mellitus type II    Food intolerance    Headache    High blood pressure    IBS (irritable bowel syndrome)    Insomnia    Migraines    PCOS (polycystic ovarian syndrome)    PTSD (post-traumatic stress disorder)    Seasonal allergies    Thyroid disease    Past Surgical History:  Procedure Laterality Date   CHOLECYSTECTOMY     GALLBLADDER SURGERY     Patient Active Problem List   Diagnosis Date Noted   Class 1 obesity 12/22/2019   ASCUS with positive high risk HPV cervical 03/13/2017   Type 2 diabetes mellitus with hyperglycemia, without long-term current use of insulin (HCC) 03/28/2016   Routine general medical examination at a health care facility 01/24/2016   Acquired hypothyroidism 01/24/2016   Anemia 04/14/2014   Hyperlipidemia 08/18/2013   Essential hypertension 08/26/2012   Irregular menses 08/07/2012   History of abnormal Pap smear 08/07/2012   GERD (gastroesophageal reflux disease) 08/07/2012   Asthma, mild persistent 08/07/2012   Migraine 08/07/2012   History of PCOS 08/07/2012   Genital warts 08/07/2012   S/P cholecystectomy 08/07/2012   Seasonal and perennial allergic rhinitis 08/07/2012   Severe episode of recurrent major  depressive disorder (HCC) 12/06/2011   PTSD (post-traumatic stress disorder) 11/02/2011    PCP: Ria Clock, FNP  REFERRING PROVIDER: Olive Bass, FNP  REFERRING DIAG: M54.2 (ICD-10-CM) - Neck pain  THERAPY DIAG:  Neck pain  Rationale for Evaluation and Treatment: Rehabilitation  ONSET DATE: 09/24/2023- date of referral  SUBJECTIVE:  SUBJECTIVE STATEMENT: Pt arrives to session; she states she is doing well. She reports that she is just a little tired due to her chronic insomnia. She reports overall she is doing well. She reports her greatest limiter is fatigue with reps and can only tolerate about 7-8 right now out of 10.   PERTINENT HISTORY:  Anxiety, DM II, PTSD, HTN, depression  PAIN:  Are you having pain? Yes: NPRS scale: 3/10 neck pain Pain location: L upper trap/levator scapulae region Pain description: dull ache Aggravating factors: R of neck Relieving factors: heat, rest  PRECAUTIONS: None  RED FLAGS: None     WEIGHT BEARING RESTRICTIONS: No  FALLS:  Has patient fallen in last 6 months? No   OCCUPATION: on diability for >20 years  PLOF: Independent  PATIENT GOALS: improve neck pain   OBJECTIVE:  Note: Objective measures were completed at Evaluation unless otherwise noted.  DIAGNOSTIC FINDINGS:  none   TREATMENT DATE:                                                                                                                               10/29/23  TherAct: Vitals:   10/29/23 0940  BP: 113/87  Pulse: (!) 45  SpO2: 98%  Patient reports HR typically runs low; however, she reports this HR reading lower than normal for her; she feels asymptomatic but we discussed following up with PCP for closer look, patient also has a follow up with  cardiology in about a month, patient has pulse ox at home and recommend close monitoring, discussed when to go in to ED, PT will route session to PCP for opinion as well  Heart rate during session: 44-52 bpm  TherEx: Modified with close monitoring of heart rate during session,  Reviewed HEP:  Flexion into Extension 2 x 10-15 reps Cervical Rotation 2 x 10-15 reps   Patient reports feeling palpitations; pt assess wrist heart rate and rhythm regular without skipped beats, pulse oxy also reading regular without large swings in BP, once PT explains findings patient with no more reports of palpitations during session  Additions to HEP: Chin tuck: 2 x 10 reps  Upper trap stretch 4 x 30 seconds  Levator scapulae stretch 4 x 30 seconds   PreTx Pain: 3/10 PostTx Pain 2/10  PATIENT EDUCATION:  Education details: see above Person educated: Patient Education method: Medical illustrator Education comprehension: verbalized understanding  HOME EXERCISE PROGRAM: Access Code: [email or web address] URL: [email or web address] Date: 10/29/2023 Prepared by: Maryruth Eve  Exercises - Seated Cervical Flexion AROM  - 2 x daily - 7 x weekly - 10 reps - Seated Cervical Extension AROM  - 2 x daily - 7 x weekly - 10 reps - Seated Cervical Rotation AROM  - 2 x daily - 7 x weekly - 10 reps - Seated Cervical Retraction  - 1 x daily - 7 x weekly - 3 sets - 10 reps - 1-2 seconds hold - Seated  Levator Scapulae Stretch  - 1 x daily - 7 x weekly - 3 sets - 30 seconds  hold - Seated Upper Trapezius Stretch  - 1 x daily - 7 x weekly - 3 sets - 30 seconds hold  ASSESSMENT:  CLINICAL IMPRESSION: Emphasis on skilled PT session on review and progression of HEP. Patient presenting with bradycardia during session but asymptomatic so advised follow up with PCP and cardiology. Patient reports feeling brief palpitations halfway during session; however, when PT assessed wrist pulse and pulse oxy rhythm regular  and consistent. Patient presents with reduced apprehension after this point and reports none for remainder of session. Patient otherwise tolerated session very well reporting reduction in pain from 3/10 to 2/10 by end of session. Continue POC.   OBJECTIVE IMPAIRMENTS: decreased ROM, hypomobility, increased fascial restrictions, increased muscle spasms, impaired flexibility, postural dysfunction, and pain.   ACTIVITY LIMITATIONS:  none  PARTICIPATION LIMITATIONS:  none  PERSONAL FACTORS: Time since onset of injury/illness/exacerbation are also affecting patient's functional outcome.   REHAB POTENTIAL: Good  CLINICAL DECISION MAKING: Stable/uncomplicated  EVALUATION COMPLEXITY: Low   GOALS: Goals reviewed with patient? Yes  SHORT TERM GOALS =  long term goals   LONG TERM GOALS: Target date: 11/21/2023    Pt will demo 70 deg of of cervical rotation bil to improve ability to turn her head. Baseline: 55 deg rotation to R and 70 deg to L (eval) Goal status: INITIAL  2.  Patient will report of improved neck pain frequency to <4/month to improve function; Baseline: 8 days/month Goal status: INITIAL  3.  Pt will improve 10% on NDI to improve neck pain and ability to self manage. Baseline: 22% (10/24/23) Goal status: INITIAL  PLAN:  PT FREQUENCY: 2x/week  PT DURATION: 4 weeks  PLANNED INTERVENTIONS: 97164- PT Re-evaluation, 97110-Therapeutic exercises, 97530- Therapeutic activity, 97112- Neuromuscular re-education, 97535- Self Care, 16109- Manual therapy, 97014- Electrical stimulation (unattended), 97012- Traction (mechanical), Patient/Family education, Dry Needling, Joint mobilization, Spinal mobilization, Cryotherapy, and Moist heat  PLAN FOR NEXT SESSION: Review HEP + progress cervical strength and ROM, chin tuck progression with resistance, did patient follow up with PCP and cardiology?  Carmelia Bake, PT, DPT 10/29/2023, 10:15 AM

## 2023-10-31 ENCOUNTER — Ambulatory Visit (HOSPITAL_COMMUNITY): Payer: PPO | Admitting: Psychiatry

## 2023-11-01 ENCOUNTER — Ambulatory Visit: Payer: PPO

## 2023-11-01 DIAGNOSIS — M542 Cervicalgia: Secondary | ICD-10-CM | POA: Diagnosis not present

## 2023-11-01 DIAGNOSIS — M6281 Muscle weakness (generalized): Secondary | ICD-10-CM

## 2023-11-01 NOTE — Therapy (Signed)
OUTPATIENT PHYSICAL THERAPY CERVICAL TREATMENT   Patient Name: [name] MRN: [id number] DOB:1965-11-07, 58 y.o., female Today's Date: 11/01/2023  END OF SESSION:  PT End of Session - 11/01/23 1321     Visit Number 3    Number of Visits 9    Date for PT Re-Evaluation 11/21/23    Authorization Type HTA, no auth required, VL: [place]    PT Start Time 1315    PT Stop Time 1400    PT Time Calculation (min) 45 min    Activity Tolerance Patient tolerated treatment well    Behavior During Therapy WFL for tasks assessed/performed;Anxious             Past Medical History:  Diagnosis Date   Abnormal Papanicolaou smear of cervix with positive human papilloma virus (HPV) test 02/2017   Anxiety    Asthma    Depression    Diabetes mellitus type II    Food intolerance    Headache    High blood pressure    IBS (irritable bowel syndrome)    Insomnia    Migraines    PCOS (polycystic ovarian syndrome)    PTSD (post-traumatic stress disorder)    Seasonal allergies    Thyroid disease    Past Surgical History:  Procedure Laterality Date   CHOLECYSTECTOMY     GALLBLADDER SURGERY     Patient Active Problem List   Diagnosis Date Noted   Class 1 obesity 12/22/2019   ASCUS with positive high risk HPV cervical 03/13/2017   Type 2 diabetes mellitus with hyperglycemia, without long-term current use of insulin (HCC) 03/28/2016   Routine general medical examination at a health care facility 01/24/2016   Acquired hypothyroidism 01/24/2016   Anemia 04/14/2014   Hyperlipidemia 08/18/2013   Essential hypertension 08/26/2012   Irregular menses 08/07/2012   History of abnormal Pap smear 08/07/2012   GERD (gastroesophageal reflux disease) 08/07/2012   Asthma, mild persistent 08/07/2012   Migraine 08/07/2012   History of PCOS 08/07/2012   Genital warts 08/07/2012   S/P cholecystectomy 08/07/2012   Seasonal and perennial allergic rhinitis 08/07/2012   Severe episode of recurrent major  depressive disorder (HCC) 12/06/2011   PTSD (post-traumatic stress disorder) 11/02/2011    PCP: Abigail Clock, FNP  REFERRING PROVIDER: Olive Bass, FNP  REFERRING DIAG: M54.2 (ICD-10-CM) - Neck pain  THERAPY DIAG:  Muscle weakness (generalized)  Neck pain  Rationale for Evaluation and Treatment: Rehabilitation  ONSET DATE: 09/24/2023- date of referral  SUBJECTIVE:  SUBJECTIVE STATEMENT: Pt reports minimal neck pain over last couple of days. Has been doing exercises consistently. Pt is trying different combination of medication and some OTC supplements which has helped her sleep better for last few days where is is getting 6 hours of sleep. PERTINENT HISTORY:  Anxiety, DM II, PTSD, HTN, depression  PAIN:  Are you having pain? Yes: NPRS scale: 1/10 neck pain Pain location: L upper trap/levator scapulae region Pain description: dull ache Aggravating factors: R of neck Relieving factors: heat, rest  PRECAUTIONS: None  RED FLAGS: None     WEIGHT BEARING RESTRICTIONS: No  FALLS:  Has patient fallen in last 6 months? No   OCCUPATION: on diability for >20 years  PLOF: Independent  PATIENT GOALS: improve neck pain   OBJECTIVE:  Note: Objective measures were completed at Evaluation unless otherwise noted.  DIAGNOSTIC FINDINGS:  none   TREATMENT DATE:                                                                                                                               11/01/23 Flexion into Extension  15 reps Cervical Rotation 15 reps  Cervical flexion: 15x Upper trap and levator scapulae stretching: 2 x 30" R and L  We discussed that her symptoms are most likely better because she is getting enough sleep and her sensitivity from pain is lower as  well. We discussed continued compliance with HEP to make sure she is practicing full ROM throughout the day to ensure neck doesn't stiffen up. We also discussed that occasionally she may have flare ups in her neck pain (because she may sleep the wrong way) and doing exercises will help reduce her pain.  We reviewed proper pain management with exercise by never exercising or stretch into pain to reduce muscle guarding and spasms.     PATIENT EDUCATION:  Education details: see above Person educated: Patient Education method: Medical illustrator Education comprehension: verbalized understanding  HOME EXERCISE PROGRAM: Access Code: [email or web address] URL: [email or web address] Date: 10/29/2023 Prepared by: [name]  Exercises - Seated Cervical Flexion AROM  - 2 x daily - 7 x weekly - 10 reps - Seated Cervical Extension AROM  - 2 x daily - 7 x weekly - 10 reps - Seated Cervical Rotation AROM  - 2 x daily - 7 x weekly - 10 reps - Seated Cervical Retraction  - 1 x daily - 7 x weekly - 3 sets - 10 reps - 1-2 seconds hold - Seated Levator Scapulae Stretch  - 1 x daily - 7 x weekly - 3 sets - 30 seconds  hold - Seated Upper Trapezius Stretch  - 1 x daily - 7 x weekly - 3 sets - 30 seconds hold  ASSESSMENT:  CLINICAL IMPRESSION: We continued to review HEP as patient is reporting low levels of pain with continued compliance with HEP and primarily her sleeping patterns are improving which is helping with her neck pain.  OBJECTIVE IMPAIRMENTS: decreased ROM, hypomobility, increased fascial restrictions, increased muscle spasms, impaired flexibility, postural dysfunction, and pain.   ACTIVITY LIMITATIONS:  none  PARTICIPATION LIMITATIONS:  none  PERSONAL FACTORS: Time since onset of injury/illness/exacerbation are also affecting patient's functional outcome.   REHAB POTENTIAL: Good  CLINICAL DECISION MAKING: Stable/uncomplicated  EVALUATION COMPLEXITY:  Low   GOALS: Goals reviewed with patient? Yes  SHORT TERM GOALS =  long term goals   LONG TERM GOALS: Target date: 11/21/2023    Pt will demo 70 deg of of cervical rotation bil to improve ability to turn her head. Baseline: 55 deg rotation to R and 70 deg to L (eval); 60 deg R and 65 deg to L  Goal status: INITIAL  2.  Patient will report of improved neck pain frequency to <4/month to improve function; Baseline: 8 days/month Goal status: INITIAL  3.  Pt will improve 10% on NDI to improve neck pain and ability to self manage. Baseline: 22% (10/24/23) Goal status: INITIAL  PLAN:  PT FREQUENCY: 2x/week  PT DURATION: 4 weeks  PLANNED INTERVENTIONS: 97164- PT Re-evaluation, 97110-Therapeutic exercises, 97530- Therapeutic activity, 97112- Neuromuscular re-education, 97535- Self Care, 19147- Manual therapy, 97014- Electrical stimulation (unattended), 97012- Traction (mechanical), Patient/Family education, Dry Needling, Joint mobilization, Spinal mobilization, Cryotherapy, and Moist heat  PLAN FOR NEXT SESSION: Review HEP + progress cervical strength and ROM, chin tuck progression with resistance, did patient follow up with PCP and cardiology?  Ileana Ladd, PT, DPT 11/01/2023, 2:17 PM

## 2023-11-05 ENCOUNTER — Ambulatory Visit: Payer: PPO | Admitting: Psychology

## 2023-11-05 DIAGNOSIS — F3341 Major depressive disorder, recurrent, in partial remission: Secondary | ICD-10-CM

## 2023-11-05 DIAGNOSIS — F411 Generalized anxiety disorder: Secondary | ICD-10-CM

## 2023-11-05 DIAGNOSIS — F4310 Post-traumatic stress disorder, unspecified: Secondary | ICD-10-CM | POA: Diagnosis not present

## 2023-11-05 NOTE — Progress Notes (Signed)
[facility] Counselor/Therapist Progress Note  Patient ID: [name], MRN: [id number]    Date: 11/05/23  Time Spent: 1:02 pm -  1:55 pm : 53 Minutes  Treatment Type: Individual Therapy.  Reported Symptoms: Depression and Anxiety.   Mental Status Exam: Appearance:  Casual     Behavior: Appropriate  Motor: Normal  Speech/Language:  Clear and Coherent  Affect: Appropriate  Mood: dysthymic  Thought process: normal  Thought content:   WNL  Sensory/Perceptual disturbances:   WNL  Orientation: oriented to person, place, time/date, and situation  Attention: Good  Concentration: Good  Memory: WNL  Fund of knowledge:  Good  Insight:   Good  Judgment:  Good  Impulse Control: Good   Risk Assessment: Danger to Self:  No Self-injurious Behavior: No Danger to Others: No Duty to Warn:no Physical Aggression / Violence:No  Access to Firearms a concern: No  Gang Involvement:No   Subjective:   [name] participated in the session from  the office, and consented to treatment with the therapist. [name] noted the events of the past week.[name] noted improved sleep in the past through nights. She noted having more energy and noted the possibly that this is due to taking her thyroid medication consistently.  She noted recently starting OT and PT and noted the exercises appearing to be effective thus far. She noted interest in companionship but noted it being to "put one's self out there". She noted it being "scary" due to the "possible rejection". She reflected on her most recent relationship and we worked on relating this experience with what she looks for going forward including experience a sense of safety. She noted her boundaries not being respected and needs not being met, in the past. We worked on processing this during the session. She noted a need for communication, empathy, respecting boundaries, mutual enjoyment, compatible dynamics, and respect. She noted a need for  respecting people's mental health. Therapist encouraged [name] to work on compiling a list of what she is looking for in a relationship to be processed during our follow-up. [name] was engaged and motivated during the session. She expressed commitment towards the session goals. Therapist praised [name] and provided supportive therapy. A follow-up was scheduled for continued treatment.   Interventions: CBT  Diagnosis:    Major depressive disorder, recurrent episode, in partial remission (HCC)  GAD (generalized anxiety disorder)  Posttraumatic stress disorder  Psychiatric Treatment: Yes , Dr. Fredda Hammed.  See chart.   Treatment Plan:  Client Abilities/Strengths [name] is intelligent, well spoken, and motivated for change.   Support System: Friends from church.   Client Treatment Preferences OPT  Client Statement of Needs [name] would like to increase motivation and task, becoming more social and date, managing overall mood, processing past events, bolstering coping skills, managing overall symptoms, & increasing mindfulness.     Treatment Level Weekly  Symptoms  Depression:  Loss of interest, feeling down, poor sleep, lethargy, poor appetite, difficulty concentrating  (Status: maintained) Anxiety: feeling nervous, worrying about different things, trouble relaxing, irritability, feeling afraid as if something awful might happen, anxiety leaving the home.   (Status: maintained)  Goals:   [name] experiences symptoms of anxiety, depression, and PTSD.   Target Date: 11/20/23 Frequency: Weekly  Progress: 0 Modality: individual    Therapist will provide referrals for additional resources as appropriate.  Therapist will provide psycho-education regarding [name]'s diagnosis and corresponding treatment approaches and interventions. Licensed Clinical Social Worker, [place], [name] will support the patient's ability  to achieve the goals identified. will employ CBT, BA, Problem-solving, Solution  Focused, Mindfulness,  coping skills, & other evidenced-based practices will be used to promote progress towards healthy functioning to help manage decrease symptoms associated with her diagnosis.   Reduce overall level, frequency, and intensity of the feelings of depression & anxiety evidenced by decreased overall symptoms from 6 to 7 days/week to 0 to 1 days/week per client report for at least 3 consecutive months. Verbally express understanding of the relationship between feelings of depression, anxiety and their impact on thinking patterns and behaviors. Verbalize an understanding of the role that distorted thinking plays in creating fears, excessive worry, and ruminations.  [name] participated in the creation of the treatment plan)    [name], [name]

## 2023-11-06 ENCOUNTER — Ambulatory Visit: Payer: PPO

## 2023-11-06 ENCOUNTER — Ambulatory Visit: Payer: PPO | Admitting: Occupational Therapy

## 2023-11-08 ENCOUNTER — Ambulatory Visit: Payer: PPO

## 2023-11-08 DIAGNOSIS — R278 Other lack of coordination: Secondary | ICD-10-CM

## 2023-11-08 DIAGNOSIS — M6281 Muscle weakness (generalized): Secondary | ICD-10-CM

## 2023-11-08 DIAGNOSIS — M542 Cervicalgia: Secondary | ICD-10-CM

## 2023-11-08 NOTE — Therapy (Signed)
OUTPATIENT PHYSICAL THERAPY CERVICAL TREATMENT   Patient Name: [name] MRN: [id number] DOB:March 11, 1966, 58 y.o., female Today's Date: 11/08/2023  PHYSICAL THERAPY DISCHARGE SUMMARY  Visits from Start of Care: 4  Current functional level related to goals / functional outcomes: Fully functional   Remaining deficits: none   Education / Equipment: HEP   Patient agrees to discharge. Patient goals were partially met. Patient is being discharged due to maximized rehab potential.   END OF SESSION:  PT End of Session - 11/08/23 1153     Visit Number 4    Number of Visits 9    Date for PT Re-Evaluation 11/21/23    Authorization Type HTA, no auth required, VL: [place]    PT Start Time 1150    PT Stop Time 1230    PT Time Calculation (min) 40 min    Activity Tolerance Patient tolerated treatment well    Behavior During Therapy WFL for tasks assessed/performed;Anxious             Past Medical History:  Diagnosis Date   Abnormal Papanicolaou smear of cervix with positive human papilloma virus (HPV) test 02/2017   Anxiety    Asthma    Depression    Diabetes mellitus type II    Food intolerance    Headache    High blood pressure    IBS (irritable bowel syndrome)    Insomnia    Migraines    PCOS (polycystic ovarian syndrome)    PTSD (post-traumatic stress disorder)    Seasonal allergies    Thyroid disease    Past Surgical History:  Procedure Laterality Date   CHOLECYSTECTOMY     GALLBLADDER SURGERY     Patient Active Problem List   Diagnosis Date Noted   Class 1 obesity 12/22/2019   ASCUS with positive high risk HPV cervical 03/13/2017   Type 2 diabetes mellitus with hyperglycemia, without long-term current use of insulin (HCC) 03/28/2016   Routine general medical examination at a health care facility 01/24/2016   Acquired hypothyroidism 01/24/2016   Anemia 04/14/2014   Hyperlipidemia 08/18/2013   Essential hypertension 08/26/2012   Irregular menses  08/07/2012   History of abnormal Pap smear 08/07/2012   GERD (gastroesophageal reflux disease) 08/07/2012   Asthma, mild persistent 08/07/2012   Migraine 08/07/2012   History of PCOS 08/07/2012   Genital warts 08/07/2012   S/P cholecystectomy 08/07/2012   Seasonal and perennial allergic rhinitis 08/07/2012   Severe episode of recurrent major depressive disorder (HCC) 12/06/2011   PTSD (post-traumatic stress disorder) 11/02/2011    PCP: Ria Clock, FNP  REFERRING PROVIDER: Olive Bass, FNP  REFERRING DIAG: M54.2 (ICD-10-CM) - Neck pain  THERAPY DIAG:  Muscle weakness (generalized)  Neck pain  Other lack of coordination  Rationale for Evaluation and Treatment: Rehabilitation  ONSET DATE: 09/24/2023- date of referral  SUBJECTIVE:  SUBJECTIVE STATEMENT: Pt reports that she is still sleeping pretty good. She reports of mid back stiffness since this morning that is preventing her from looking down or causing pain with that movement.  PERTINENT HISTORY:  Anxiety, DM II, PTSD, HTN, depression  PAIN:  Are you having pain? Yes: NPRS scale: 1/10 neck pain Pain location: L upper trap/levator scapulae region Pain description: dull ache Aggravating factors: R of neck Relieving factors: heat, rest  PRECAUTIONS: None  RED FLAGS: None     WEIGHT BEARING RESTRICTIONS: No  FALLS:  Has patient fallen in last 6 months? No   OCCUPATION: on diability for >20 years  PLOF: Independent  PATIENT GOALS: improve neck pain   OBJECTIVE:  Note: Objective measures were completed at Evaluation unless otherwise noted.  DIAGNOSTIC FINDINGS:  none   TREATMENT DATE:                                                                                                                                11/08/23 Supine over towel roll - self thoracic extensions: 20x, also discussed trial of prolonged holds 5-10 sec with hands behind neck for neck support - towel placed vertically: hands behind neck: pec stretch with shoulder h. Abduction: 10" holds x 5 - towel placed vertically: supine pec stretch with arms in H. Abduction: 2 x 30" - Towel placed vertically; Supine OH stretch for lat stretching and thoracic extensio: 10x - Snow angels: towel placed vertically: 10x Pt seemed to enjoy those exercises and she was recommended to purchase medium density white foam roller from [employer] if she wished to perform these exercises at home. Above exericses given for HEP  NDI: 8%  We reviewed proper pain management with exercise by never exercising or stretch into pain to reduce muscle guarding and spasms.     PATIENT EDUCATION:  Education details: see above Person educated: Patient Education method: Medical illustrator Education comprehension: verbalized understanding  HOME EXERCISE PROGRAM: Access Code: [email or web address] URL: [email or web address] Date: 11/08/2023 Prepared by: Lavone Nian  Exercises - Seated Cervical Flexion AROM  - 2 x daily - 7 x weekly - 10 reps - Seated Cervical Extension AROM  - 2 x daily - 7 x weekly - 10 reps - Seated Cervical Rotation AROM  - 2 x daily - 7 x weekly - 10 reps - Seated Cervical Retraction  - 1 x daily - 7 x weekly - 3 sets - 10 reps - 1-2 seconds hold - Seated Levator Scapulae Stretch  - 1 x daily - 7 x weekly - 3 sets - 30 seconds  hold - Seated Upper Trapezius Stretch  - 1 x daily - 7 x weekly - 3 sets - 30 seconds hold - Supine Chest Stretch on Foam Roll  - 1 x daily - 7 x weekly - 2 sets - 10 reps - Thoracic Extension Mobilization on Foam Roll  - 1 x daily - 7 x weekly -  2 sets - 10 reps - OverHead stretch on Foam roller  - 1 x daily - 7 x weekly - 2 sets - 10 reps  ASSESSMENT:  CLINICAL IMPRESSION: Patient has been  seen for total of 4 sessions in physical therapy for neck pain. Patient is currently compliant with HEP. Patient is currently reporting minimal to no pain and is able to manage pain with home exercise program. Today's session was focused on updating her HEP to give some more flexibility exercises.   OBJECTIVE IMPAIRMENTS: decreased ROM, hypomobility, increased fascial restrictions, increased muscle spasms, impaired flexibility, postural dysfunction, and pain.   ACTIVITY LIMITATIONS:  none  PARTICIPATION LIMITATIONS:  none  PERSONAL FACTORS: Time since onset of injury/illness/exacerbation are also affecting patient's functional outcome.   REHAB POTENTIAL: Good  CLINICAL DECISION MAKING: Stable/uncomplicated  EVALUATION COMPLEXITY: Low   GOALS: Goals reviewed with patient? Yes  SHORT TERM GOALS =  long term goals   LONG TERM GOALS: Target date: 11/21/2023    Pt will demo 70 deg of of cervical rotation bil to improve ability to turn her head. Baseline: 55 deg rotation to R and 70 deg to L (eval); 60 deg R and 65 deg to L  Goal status: not met   2.  Patient will report of improved neck pain frequency to <4/month to improve function; Baseline: 8 days/month Goal status: feels pain less <1-2/day..Goal met  3.  Pt will improve 10% on NDI to improve neck pain and ability to self manage. Baseline: 22% (10/24/23); 8% *(11/08/23) Goal status: Goal met 11/08/23  PLAN: discharge from skilled Annye Rusk, PT, DPT 11/08/2023, 11:54 AM

## 2023-11-13 ENCOUNTER — Ambulatory Visit: Payer: PPO | Admitting: Occupational Therapy

## 2023-11-13 DIAGNOSIS — M79641 Pain in right hand: Secondary | ICD-10-CM

## 2023-11-13 DIAGNOSIS — M79642 Pain in left hand: Secondary | ICD-10-CM

## 2023-11-13 DIAGNOSIS — M6281 Muscle weakness (generalized): Secondary | ICD-10-CM

## 2023-11-13 DIAGNOSIS — M542 Cervicalgia: Secondary | ICD-10-CM | POA: Diagnosis not present

## 2023-11-13 DIAGNOSIS — R278 Other lack of coordination: Secondary | ICD-10-CM

## 2023-11-13 NOTE — Therapy (Signed)
 OUTPATIENT OCCUPATIONAL THERAPY ORTHO TREATMENT  Patient Name: CARITA SOLLARS MRN: [id number] DOB:28-Apr-1966, 58 y.o., female Today's Date: 11/13/2023  PCP: Olive Bass, FNP REFERRING PROVIDER: Olive Bass, FNP  END OF SESSION:  OT End of Session - 11/13/23 0001     Visit Number 2    Number of Visits 6    Date for OT Re-Evaluation 11/29/23    Authorization Type [employer] 2025 Auth Not Reqd VL: [place]    OT Start Time 1015    OT Stop Time 1110    OT Time Calculation (min) 55 min    Equipment Utilized During Treatment putty    Activity Tolerance Patient tolerated treatment well   low pulse rate   Behavior During Therapy WFL for tasks assessed/performed             Past Medical History:  Diagnosis Date   Abnormal Papanicolaou smear of cervix with positive human papilloma virus (HPV) test 02/2017   Anxiety    Asthma    Depression    Diabetes mellitus type II    Food intolerance    Headache    High blood pressure    IBS (irritable bowel syndrome)    Insomnia    Migraines    PCOS (polycystic ovarian syndrome)    PTSD (post-traumatic stress disorder)    Seasonal allergies    Thyroid disease    Past Surgical History:  Procedure Laterality Date   CHOLECYSTECTOMY     GALLBLADDER SURGERY     Patient Active Problem List   Diagnosis Date Noted   Class 1 obesity 12/22/2019   ASCUS with positive high risk HPV cervical 03/13/2017   Type 2 diabetes mellitus with hyperglycemia, without long-term current use of insulin (HCC) 03/28/2016   Routine general medical examination at a health care facility 01/24/2016   Acquired hypothyroidism 01/24/2016   Anemia 04/14/2014   Hyperlipidemia 08/18/2013   Essential hypertension 08/26/2012   Irregular menses 08/07/2012   History of abnormal Pap smear 08/07/2012   GERD (gastroesophageal reflux disease) 08/07/2012   Asthma, mild persistent 08/07/2012   Migraine 08/07/2012   History of PCOS 08/07/2012    Genital warts 08/07/2012   S/P cholecystectomy 08/07/2012   Seasonal and perennial allergic rhinitis 08/07/2012   Severe episode of recurrent major depressive disorder (HCC) 12/06/2011   PTSD (post-traumatic stress disorder) 11/02/2011    ONSET DATE: 09/24/2023  REFERRING DIAG: G56.03 (ICD-10-CM) - Carpal tunnel syndrome, bilateral "Reason for Referral: bilateral hand/ wrist pain"  THERAPY DIAG:  Muscle weakness (generalized)  Other lack of coordination  Pain in right hand  Pain in left hand  Rationale for Evaluation and Treatment: Rehabilitation  SUBJECTIVE:   SUBJECTIVE STATEMENT: Named pronounced SUH-ra  Pt reports that she found something in her "beet chews" (qunol ie) nitrates) that was affecting her HR and when she stopped taking it, her heart rate has been better and she has felt much better. qulon - nitrates.  Pt reports "the exercises" are helping her hands feel better.  Pt accompanied by: self  PERTINENT HISTORY:  Pt reports long history of issues on and off over the years related to carpal tunnel symptoms but no surgical intervention.  PMHx: Migraines, essential HTN, asthma, GERN, DM2, PTSD, depression  PRECAUTIONS: Other: Diabetic - FreeStyle Libre on arm  LATEX allergy  RED FLAGS: None   WEIGHT BEARING RESTRICTIONS: No  PAIN:  Are you having pain? Not really - maybe a 1/10  FALLS: Has patient fallen in last 6  months? No - but stumbles, catching herself on something before falling ~ maybe a couple of times/month  LIVING ENVIRONMENT: Lives with: lives alone - 2 dogs and cat; has tenants that rent rooms from her - share kitchen but separate bathrooms etc. Lives in: House Stairs: Yes: External: 3 steps; can reach both Has following equipment at home: Walker - 4 wheeled and has ordered a lift chair  PLOF: Independent - drives but not working/volunteering  PATIENT GOALS: Regain as much strength in my hands as possible  NEXT MD VISIT: NA    OBJECTIVE:  Note: Objective measures were completed at Evaluation unless otherwise noted.  HAND DOMINANCE: Right  ADLs: WFL  FUNCTIONAL OUTCOME MEASURES: Quick Dash: 11.4  UPPER EXTREMITY ROM:    Generally full ROM of BUEs, slight end range limitations  but full composite flexion.  UPPER EXTREMITY MMT:     MMT Right eval Left eval  Shoulder flexion 3+ 3  Shoulder abduction    Shoulder adduction    Shoulder extension    Shoulder internal rotation    Shoulder external rotation    Middle trapezius    Lower trapezius    Elbow flexion    Elbow extension    Wrist flexion    Wrist extension    Wrist ulnar deviation    Wrist radial deviation    Wrist pronation    Wrist supination    (Blank rows = not tested)  HAND FUNCTION: Grip strength: Right: 40, 39, 40  lbs; Left: 35, 30, 27 lbs,  Average: Right 39.7 lbs Left 30.7 lbs  Lateral pinch: Right: 12 lbs, Left: 13  lbs,  3 point pinch: Right: 12 lbs, Left: 10 lbs,  Tip pinch: Right 10 lbs, Left: 10 lbs  COORDINATION: 9 Hole Peg test: Right: 22.78  sec; Left: 27.33 sec  OTR noticed some tremors during FM testing.  Pt confirms issues and reports she think its worse on L side which seemed to fit with what was observed.  Pt notes its been there a couple of years and it's worse if she hasn't eaten etc.  SENSATION: WFL  EDEMA: NA  COGNITION: Overall cognitive status: Within functional limits for tasks assessed and No family/caregiver present to determine baseline cognitive functioning Areas of impairment:   OBSERVATIONS: Pt ambulated with no AE and loss of balance. The pt appears to have tremors in BUE L>R and is somewhat hypotonic in appearance.  TREATMENT DATE: 11/13/23                                                                                                                            Therapeutic Activities:  Initiated Putty Exercises with yellow putty to begin strengthening, coordination and sensory  stimulation of B UEs.  Patient provided visual demonstration, verbal and tactile cues as needed to improve performance of the various exercises/activities including:   - Putty Squeezes - cues to squeeze putty into log for use with other exercises and  to fold putty in half with 1 hand  - Putty Rolls - encourage to roll putty into logs with sensory stimulation to entire length of hand, fingers and wrist as needed   - Pinch and Pull with Putty - this motion is combined with different pinches (3-Point Pinch, Tip Pinch, Key Pinch) - patient encouraged to combine tripod, pincer and/or key pinch with "pinch and pull" motion of putty pulling away from midline, changing between different pinches and changing different directions to change grip   -Finger Extension with Putty - pt shown how to work on task with all fingers and thumb as well as individual fingers in opposition to thumb  - Finger Adduction with Putty - pt shown how to work on weaving putty between in fingers and thumb and then squeeze her hand closed with fingers still extended  - Removing Objects from Putty  - encouraged to hide items (coins, marble, dice etc) and use one hand at a time to find the objects and identify them by tactile input before s/he digs them out and can see them visually.  OT educated patient on theraputty recommendations: avoid hot environments, place in designated container, avoid contact with fabrics. Patient verbalized understanding.    Patient benefited from extra time, verbal/tactile cues, and modeling of task to allow time for processing of verbal instructions and improve motor planning of unfamiliar movements.   Self Care: OT initiated education on joint protection principles as noted in pt instructions as needed to minimize risk of UE pain.   Handout is provided regarding joint protection and patient is encouraged to consider the specific acronym "LESS" ie) less strain on joints  L: Listen to your body E:  Energy Conservation S: Stronger Joints take the Lead S: Strategize   Patient encouraged to protect hands and wrist by  - Respecting for Pain and stopping activities before they reach the point of discomfort or pain  - Rest and Work Balance ie) balancing activities with appropriate rests during activity - Reduction of Effort - Use two hands instead of one if possible  - Use of Larger/Stronger Joints Ie) Lift or carry with the forearm or shoulder rather than fingers - Avoid Activities That Cannot Be Stopped - Use of Assistive Equipment - Consider splint use and AE equipment to protect joints from deformity and stresses Then reviewed activities that can be modified with adaptive equipment and encourage patient to look adaptive equipment for arthritis [i.e. to consider joint protection]. Pt then ordered automatic jar opener and was made aware of right angle knife etc.  PATIENT EDUCATION: Education details: OT role, POC considerations and tendon gliding exercises  Person educated: Patient Education method: Explanation, Demonstration, Tactile cues, Verbal cues, and Handouts Education comprehension: verbalized understanding, returned demonstration, verbal cues required, tactile cues required, and needs further education  HOME EXERCISE PROGRAM: 10/24/23: Tendon Gliding Exercises 11/13/23: Putty Activities Access Code: [email or web address] and Joint protection education  GOALS: Goals reviewed with patient? Yes  LONG TERM GOALS: Target date: 11/29/23  Patient will demonstrate updated B UE HEP with visual handouts only for proper execution.  Baseline: New to outpt OT Goal status: IN Progress  2.  Patient will demonstrate at least 5+ lbs improvement in B grip strength as needed to open jars and other containers. Baseline: Average: Right 39.7 lbs Left 30.7 lbs Goal status: IN Progress  3.  Pt will independently recall at least 3 joint protection, ergonomics, and body mechanic principles as noted in pt  instructions.   Baseline:  New to outpt OT Goal status: IN Progress  4.  Pt will demonstrate B coordination, FM skills and activity tolerance to resume hobby r/t making jewelry/bead weaving 3+ times/week x 15+ minutes. Baseline: NA - frequently in bed per report to PT  Goal status: INITIAL  ASSESSMENT:  CLINICAL IMPRESSION: Patient is a 58 y.o. female who was seen today for occupational therapy treatment for B carpal tunnel syndrome/wrist/hand pain. Patient had good tolerance and participation in tendon gliding activities and responded well to progression of HEP with putty activities. Pt will benefit from continued skilled OT services in the outpatient setting to work on impairments as noted at eval to help pt return to [facility] as able.    PERFORMANCE DEFICITS: in functional skills including ADLs, coordination, dexterity, edema, tone, ROM, strength, pain, Fine motor control, Gross motor control, endurance, decreased knowledge of use of DME, and UE functional use, cognitive skills including attention, emotional, and temperament/personality, and psychosocial skills including coping strategies, environmental adaptation, habits, and routines and behaviors.   IMPAIRMENTS: are limiting patient from ADLs, IADLs, and leisure.   COMORBIDITIES: may have co-morbidities  that affects occupational performance. Patient will benefit from skilled OT to address above impairments and improve overall function.  REHAB POTENTIAL: Good  PLAN:  OT FREQUENCY: 1x/week  OT DURATION: up 6 weeks - just scheduled for 4 weeks at this time  PLANNED INTERVENTIONS: 97535 self care/ADL training, 57846 therapeutic exercise, 97530 therapeutic activity, 97112 neuromuscular re-education, 97140 manual therapy, 97035 ultrasound, 97018 paraffin, 96295 fluidotherapy, 97010 moist heat, 97760 Orthotics management and training, 28413 Splinting (initial encounter), [id number] Subsequent splinting/medication, passive range of motion, energy  conservation, coping strategies training, patient/family education, and DME and/or AE instructions  RECOMMENDED OTHER SERVICES: NA - pt received PT evaluation today for neck pain  CONSULTED AND AGREED WITH PLAN OF CARE: Patient  PLAN FOR NEXT SESSION:  Review Tendon Gliding Exercises Review putty Add theraband HEP as able Coordination activities for [facility] Additional joint protection education   Victorino Sparrow, OT 11/13/2023, 11:55 AM

## 2023-11-13 NOTE — Patient Instructions (Signed)
 Access Code: [email or web address] URL: [email or web address] Date: 11/13/2023 Prepared by: Amada Kingfisher  Exercises - Putty Squeezes  - 1-2 x daily - 10 reps - Rolling Putty on Table  - 1-2 x daily - 10 reps - Finger Pinch and Pull with Putty  - 1-2 x daily - 10 reps - 3-Point Pinch with Putty  - 1-2 x daily - 10 reps - Tip PUSH with Putty  - 1-2 x daily - 10 reps - Key Pinch with Putty  - 1-2 x daily - 10 reps - Finger Extension with Putty  - 1-2 x daily - 10 reps - Finger Adduction with Putty  - 1-2 x daily - 10 reps - Removing Marbles from Putty  - 1-2 x daily - 10 reps

## 2023-11-14 ENCOUNTER — Telehealth (HOSPITAL_COMMUNITY): Payer: Self-pay | Admitting: *Deleted

## 2023-11-14 DIAGNOSIS — F3341 Major depressive disorder, recurrent, in partial remission: Secondary | ICD-10-CM

## 2023-11-14 NOTE — Telephone Encounter (Signed)
 Rx REFILL REQUEST [id number] - [place], [place] - [address]   sertraline (ZOLOFT) 100 MG tablet  LAST FILL DATE 08/18/23   NEXT APPT 11/28/23

## 2023-11-15 MED ORDER — SERTRALINE HCL 100 MG PO TABS
ORAL_TABLET | ORAL | 0 refills | Status: DC
Start: 2023-11-15 — End: 2023-11-28

## 2023-11-15 NOTE — Addendum Note (Signed)
 Addended by: Thresa Ross on: 11/15/2023 08:50 AM   Modules accepted: Orders

## 2023-11-19 ENCOUNTER — Ambulatory Visit: Payer: PPO | Admitting: Psychology

## 2023-11-19 DIAGNOSIS — F3341 Major depressive disorder, recurrent, in partial remission: Secondary | ICD-10-CM

## 2023-11-19 DIAGNOSIS — F411 Generalized anxiety disorder: Secondary | ICD-10-CM

## 2023-11-19 NOTE — Progress Notes (Signed)
 [facility] Counselor/Therapist Progress Note  Patient ID: [name], MRN: [id number]    Date: 11/19/23  Time Spent: 11:53 am -  12:40 pm : 47 Minutes  Treatment Type: Individual Therapy.  Reported Symptoms: Depression and Anxiety.   Mental Status Exam: Appearance:  Casual     Behavior: Appropriate  Motor: Normal  Speech/Language:  Clear and Coherent  Affect: Appropriate  Mood: dysthymic  Thought process: normal  Thought content:   WNL  Sensory/Perceptual disturbances:   WNL  Orientation: oriented to person, place, time/date, and situation  Attention: Good  Concentration: Good  Memory: WNL  Fund of knowledge:  Good  Insight:   Good  Judgment:  Good  Impulse Control: Good   Risk Assessment: Danger to Self:  No Self-injurious Behavior: No Danger to Others: No Duty to Warn:no Physical Aggression / Violence:No  Access to Firearms a concern: No  Gang Involvement:No   Subjective:   [name] participated in the session from  the office, and consented to treatment with the therapist. [name] noted the events of the past week.[name] noted a need to rest after a few busy weeks including home repairs and rearranging her belongings. She noted additional stressors, financial in nature, regarding. We worked on exploring this during the session, including the stressors she experienced, and the need to address these issues. She noted recently experiencing disturbed sleep. She noted recalling previous work related stressors, as a Runner, broadcasting/film/video, in a different state. She noted her upcoming birthday and interest in celebrating with friends. She noted being unable to celebrate, at home, due to the state of her home as she continued home improvement projects. She noted interest celebrating outside of the home but noted her social circle being small. She noted having a few friends who she thinks wouldn't be able to attend and discussed possibly not extending an invitation as a  result. We explored this during the session and discussed the benefits of possibly inviting them despite this jump to conclusions. We worked on identifying a tentative plan in terms invite list, location, and method of contact. We discussed this being a possible start of increased socializing, which is a goal for [name]. She noted generally being a homebody and we discussed the balance of this in relation to socializing. [name] was engaged and motivated during the session. Therapist validated [name]'s feelings and experience and provided supportive therapy. A Follow-up was scheduled for continued treatment.   Interventions: CBT & Interpersonal  Diagnosis:    Major depressive disorder, recurrent episode, in partial remission (HCC)  GAD (generalized anxiety disorder)  Psychiatric Treatment: Yes , Dr. Fredda Hammed.  See chart.   Treatment Plan:  Client Abilities/Strengths [name] is intelligent, well spoken, and motivated for change.   Support System: Friends from church.   Client Treatment Preferences OPT  Client Statement of Needs [name] would like to increase motivation and task, becoming more social and date, managing overall mood, processing past events, bolstering coping skills, managing overall symptoms, & increasing mindfulness.     Treatment Level Weekly  Symptoms  Depression:  Loss of interest, feeling down, poor sleep, lethargy, poor appetite, difficulty concentrating  (Status: maintained) Anxiety: feeling nervous, worrying about different things, trouble relaxing, irritability, feeling afraid as if something awful might happen, anxiety leaving the home.   (Status: maintained)  Goals:   [name] experiences symptoms of anxiety, depression, and PTSD.   Target Date: 11/20/23 Frequency: Weekly  Progress: 0 Modality: individual    Therapist will provide referrals for  additional resources as appropriate.  Therapist will provide psycho-education regarding [name]'s diagnosis and corresponding  treatment approaches and interventions. Licensed Clinical Social Worker, [name], [name] will support the patient's ability to achieve the goals identified. will employ CBT, BA, Problem-solving, Solution Focused, Mindfulness,  coping skills, & other evidenced-based practices will be used to promote progress towards healthy functioning to help manage decrease symptoms associated with her diagnosis.   Reduce overall level, frequency, and intensity of the feelings of depression & anxiety evidenced by decreased overall symptoms from 6 to 7 days/week to 0 to 1 days/week per client report for at least 3 consecutive months. Verbally express understanding of the relationship between feelings of depression, anxiety and their impact on thinking patterns and behaviors. Verbalize an understanding of the role that distorted thinking plays in creating fears, excessive worry, and ruminations.  [name] participated in the creation of the treatment plan)    Abigail Ovens, [name]

## 2023-11-20 ENCOUNTER — Ambulatory Visit: Payer: PPO | Attending: Family | Admitting: Occupational Therapy

## 2023-11-20 ENCOUNTER — Encounter: Payer: Self-pay | Admitting: Occupational Therapy

## 2023-11-20 DIAGNOSIS — R278 Other lack of coordination: Secondary | ICD-10-CM | POA: Diagnosis not present

## 2023-11-20 DIAGNOSIS — M79641 Pain in right hand: Secondary | ICD-10-CM | POA: Insufficient documentation

## 2023-11-20 DIAGNOSIS — M6281 Muscle weakness (generalized): Secondary | ICD-10-CM | POA: Insufficient documentation

## 2023-11-20 DIAGNOSIS — M79642 Pain in left hand: Secondary | ICD-10-CM | POA: Insufficient documentation

## 2023-11-20 NOTE — Therapy (Signed)
 OUTPATIENT OCCUPATIONAL THERAPY ORTHO TREATMENT  Patient Name: [name] MRN: [id number] DOB:11/29/1965, 58 y.o., female Today's Date: 11/20/2023  PCP: Olive Bass, FNP REFERRING PROVIDER: Olive Bass, FNP  END OF SESSION:  OT End of Session - 11/20/23 1031     Visit Number 3    Number of Visits 6    Date for OT Re-Evaluation 11/29/23    Authorization Type [employer] 2025 Auth Not Reqd VL: [place]    OT Start Time 1022    OT Stop Time 1101    OT Time Calculation (min) 39 min    Equipment Utilized During Treatment putty    Activity Tolerance Patient tolerated treatment well   low pulse rate   Behavior During Therapy WFL for tasks assessed/performed             Past Medical History:  Diagnosis Date   Abnormal Papanicolaou smear of cervix with positive human papilloma virus (HPV) test 02/2017   Anxiety    Asthma    Depression    Diabetes mellitus type II    Food intolerance    Headache    High blood pressure    IBS (irritable bowel syndrome)    Insomnia    Migraines    PCOS (polycystic ovarian syndrome)    PTSD (post-traumatic stress disorder)    Seasonal allergies    Thyroid disease    Past Surgical History:  Procedure Laterality Date   CHOLECYSTECTOMY     GALLBLADDER SURGERY     Patient Active Problem List   Diagnosis Date Noted   Class 1 obesity 12/22/2019   ASCUS with positive high risk HPV cervical 03/13/2017   Type 2 diabetes mellitus with hyperglycemia, without long-term current use of insulin (HCC) 03/28/2016   Routine general medical examination at a health care facility 01/24/2016   Acquired hypothyroidism 01/24/2016   Anemia 04/14/2014   Hyperlipidemia 08/18/2013   Essential hypertension 08/26/2012   Irregular menses 08/07/2012   History of abnormal Pap smear 08/07/2012   GERD (gastroesophageal reflux disease) 08/07/2012   Asthma, mild persistent 08/07/2012   Migraine 08/07/2012   History of PCOS 08/07/2012   Genital  warts 08/07/2012   S/P cholecystectomy 08/07/2012   Seasonal and perennial allergic rhinitis 08/07/2012   Severe episode of recurrent major depressive disorder (HCC) 12/06/2011   PTSD (post-traumatic stress disorder) 11/02/2011    ONSET DATE: 09/24/2023  REFERRING DIAG: G56.03 (ICD-10-CM) - Carpal tunnel syndrome, bilateral "Reason for Referral: bilateral hand/ wrist pain"  THERAPY DIAG:  Muscle weakness (generalized)  Other lack of coordination  Pain in right hand  Pain in left hand  Rationale for Evaluation and Treatment: Rehabilitation  SUBJECTIVE:   SUBJECTIVE STATEMENT: Named pronounced SUH-ra  My knees are aching but hands are ok today  Pt accompanied by: self  PERTINENT HISTORY:  Pt reports long history of issues on and off over the years related to carpal tunnel symptoms but no surgical intervention.  PMHx: Migraines, essential HTN, asthma, GERN, DM2, PTSD, depression  PRECAUTIONS: Other: Diabetic - FreeStyle Libre on arm  LATEX allergy  RED FLAGS: None   WEIGHT BEARING RESTRICTIONS: No  PAIN:  Are you having pain? Not really - maybe a 1/10  FALLS: Has patient fallen in last 6 months? No - but stumbles, catching herself on something before falling ~ maybe a couple of times/month  LIVING ENVIRONMENT: Lives with: lives alone - 2 dogs and cat; has tenants that rent rooms from her - share kitchen but separate  bathrooms etc. Lives in: House Stairs: Yes: External: 3 steps; can reach both Has following equipment at home: Walker - 4 wheeled and has ordered a lift chair  PLOF: Independent - drives but not working/volunteering  PATIENT GOALS: Regain as much strength in my hands as possible  NEXT MD VISIT: NA   OBJECTIVE:  Note: Objective measures were completed at Evaluation unless otherwise noted.  HAND DOMINANCE: Right  ADLs: WFL  FUNCTIONAL OUTCOME MEASURES: Quick Dash: 11.4  UPPER EXTREMITY ROM:    Generally full ROM of BUEs, slight end range  limitations  but full composite flexion.  UPPER EXTREMITY MMT:     MMT Right eval Left eval  Shoulder flexion 3+ 3  Shoulder abduction    Shoulder adduction    Shoulder extension    Shoulder internal rotation    Shoulder external rotation    Middle trapezius    Lower trapezius    Elbow flexion    Elbow extension    Wrist flexion    Wrist extension    Wrist ulnar deviation    Wrist radial deviation    Wrist pronation    Wrist supination    (Blank rows = not tested)  HAND FUNCTION: Grip strength: Right: 40, 39, 40  lbs; Left: 35, 30, 27 lbs,  Average: Right 39.7 lbs Left 30.7 lbs  Lateral pinch: Right: 12 lbs, Left: 13  lbs,  3 point pinch: Right: 12 lbs, Left: 10 lbs,  Tip pinch: Right 10 lbs, Left: 10 lbs  COORDINATION: 9 Hole Peg test: Right: 22.78  sec; Left: 27.33 sec  OTR noticed some tremors during FM testing.  Pt confirms issues and reports she think its worse on L side which seemed to fit with what was observed.  Pt notes its been there a couple of years and it's worse if she hasn't eaten etc.  SENSATION: WFL  EDEMA: NA  COGNITION: Overall cognitive status: Within functional limits for tasks assessed and No family/caregiver present to determine baseline cognitive functioning Areas of impairment:   OBSERVATIONS: Pt ambulated with no AE and loss of balance. The pt appears to have tremors in BUE L>R and is somewhat hypotonic in appearance.  TREATMENT DATE: 11/20/23                                                                                                                            Attempted fluidotherapy but unable to tolerate after 5 minutes - pt reports she is very sensitive  Therapeutic Activities:  Reviewed Putty Exercises with yellow putty for strengthening, coordination and sensory stimulation of B UEs.  Patient provided visual demonstration, verbal and tactile cues as needed to improve performance of the various exercises/activities including:   -  Putty Squeezes - cues to squeeze putty into log for use with other exercises and to fold putty in half with 1 hand  - Putty Rolls - encourage to roll putty into logs with sensory stimulation to entire length  of hand, fingers and wrist as needed   - Pinch and Pull with Putty - this motion is combined with different pinches (3-Point Pinch, Tip Pinch, Key Pinch) - patient encouraged to combine tripod, pincer and/or key pinch with "pinch and pull" motion of putty pulling away from midline, changing between different pinches and changing different directions to change grip   -Finger Extension with Putty - pt shown how to work on task with all fingers and thumb as well as individual fingers in opposition to thumb  - Finger Adduction with Putty - pt shown how to work on weaving putty between in fingers and thumb and then squeeze her hand closed with fingers still extended  - Removing Objects from Putty  - encouraged to hide items (coins, marble, dice etc) and use one hand at a time to find the objects and identify them by tactile input before s/he digs them out and can see them visually.  Pt also provided with lower median n. Gliding HEP and reviewed. Pt returned demo of each   Self Care: Pt provided additional joint protection techniques and reviewed - see pt instructions  Also provided with CTS conservative management handout (first page) and reviewed basic CTS principles and positions/movements to avoid.   Also discussed options for jewelry clasps - pt already aware   PATIENT EDUCATION: Education details: OT role, POC considerations and tendon gliding exercises  Person educated: Patient Education method: Explanation, Demonstration, Tactile cues, Verbal cues, and Handouts Education comprehension: verbalized understanding, returned demonstration, verbal cues required, tactile cues required, and needs further education  HOME EXERCISE PROGRAM: 10/24/23: Tendon Gliding Exercises 11/13/23: Putty  Activities Access Code: [email or web address] and Joint protection education  GOALS: Goals reviewed with patient? Yes  LONG TERM GOALS: Target date: 11/29/23  Patient will demonstrate updated B UE HEP with visual handouts only for proper execution.  Baseline: New to outpt OT Goal status: IN Progress  2.  Patient will demonstrate at least 5+ lbs improvement in B grip strength as needed to open jars and other containers. Baseline: Average: Right 39.7 lbs Left 30.7 lbs Goal status: IN Progress  3.  Pt will independently recall at least 3 joint protection, ergonomics, and body mechanic principles as noted in pt instructions.   Baseline: New to outpt OT Goal status: IN Progress  4.  Pt will demonstrate B coordination, FM skills and activity tolerance to resume hobby r/t making jewelry/bead weaving 3+ times/week x 15+ minutes. Baseline: NA - frequently in bed per report to PT  Goal status: INITIAL  ASSESSMENT:  CLINICAL IMPRESSION: Patient is a 58 y.o. female who was seen today for occupational therapy treatment for B carpal tunnel syndrome/wrist/hand pain. Patient had good tolerance and participation in tendon gliding activities and responded well to progression of HEP with putty activities. Pt will benefit from continued skilled OT services in the outpatient setting to work on impairments as noted at eval to help pt return to [facility] as able.    PERFORMANCE DEFICITS: in functional skills including ADLs, coordination, dexterity, edema, tone, ROM, strength, pain, Fine motor control, Gross motor control, endurance, decreased knowledge of use of DME, and UE functional use, cognitive skills including attention, emotional, and temperament/personality, and psychosocial skills including coping strategies, environmental adaptation, habits, and routines and behaviors.   IMPAIRMENTS: are limiting patient from ADLs, IADLs, and leisure.   COMORBIDITIES: may have co-morbidities  that affects occupational performance.  Patient will benefit from skilled OT to address above impairments and improve overall function.  REHAB POTENTIAL: Good  PLAN:  OT FREQUENCY: 1x/week  OT DURATION: up 6 weeks - just scheduled for 4 weeks at this time  PLANNED INTERVENTIONS: 97535 self care/ADL training, 82956 therapeutic exercise, 97530 therapeutic activity, 97112 neuromuscular re-education, 97140 manual therapy, 97035 ultrasound, 97018 paraffin, 21308 fluidotherapy, 97010 moist heat, 97760 Orthotics management and training, 65784 Splinting (initial encounter), [id number] Subsequent splinting/medication, passive range of motion, energy conservation, coping strategies training, patient/family education, and DME and/or AE instructions  RECOMMENDED OTHER SERVICES: NA - pt received PT evaluation today for neck pain  CONSULTED AND AGREED WITH PLAN OF CARE: Patient  PLAN FOR NEXT SESSION:   Coordination activities for jewelry, ? Theraband HEP prn Assess goals and d/c next visit   Sheran Lawless, OT 11/20/2023, 10:31 AM

## 2023-11-20 NOTE — Patient Instructions (Signed)
    Joint Protection  Respect pain/Develop pain awareness Monitor activities - stop to rest when discomfort or fatigue develops Awareness of pain for activities prior to 12-24 hrs Pain lingering 2 hrs after activity is a warning sign - modify or eliminate  Use larger joints and muscles Protect the delicate joints by using larger joints - use shoulder to carry bags not hands More proximal muscles and joints to perform ADL's (forearm/shoulder to open door-protect fingers) Groceries in paper bags Let the hip, elbow and arm support the grocery bag/push cart's weight Push up from seat with palms of hands, not back of fingers  Avoid tight/strong and prolonged grasp Build up handles (foam or cloth) on utensils, tools and pens Modify handles with levers Use AE for assistance for opening jars, pens and books, turning knobs and keys Change grasp to avoid static positioning Work with fingers extended over a large sponge rather than squeezing it Use open, flat palm to open jars, pump bottles for shampoo/soaps  Avoid carrying, lifting and using heavy objects Use a cart to move heavy objects Push heavy objects with hips instead of pulling with hands Distribute weight over many joints - use 2 hands to lift an item Use light weight tools and objects Use smaller containers of liquid or distribute liquids to smaller containers to lighten loads  Balance rest and activity Plan rest breaks ahead of time Avoid activities that cannot be stopped if needed Energy conservation techniques (sitting when able, planning ahead, organizing workspace and storage for accessibility, keeping most common used items within easy access, resting during activities, use timesavers like prepared foods) AE-Labor saving devices (modified cooking writing or cutting utensils, zipper pulls, button hooks, built up handle items, large grip pens, jar openers, key holder/turner, garden tools, and spring loaded scissors)  Use  splints as prescribes to protect joints

## 2023-11-27 ENCOUNTER — Ambulatory Visit: Payer: Self-pay | Admitting: Occupational Therapy

## 2023-11-27 DIAGNOSIS — M79641 Pain in right hand: Secondary | ICD-10-CM

## 2023-11-27 DIAGNOSIS — M6281 Muscle weakness (generalized): Secondary | ICD-10-CM | POA: Diagnosis not present

## 2023-11-27 DIAGNOSIS — R278 Other lack of coordination: Secondary | ICD-10-CM

## 2023-11-27 DIAGNOSIS — M79642 Pain in left hand: Secondary | ICD-10-CM

## 2023-11-27 NOTE — Therapy (Signed)
 OUTPATIENT OCCUPATIONAL THERAPY ORTHO TREATMENT & DISCHARGE SUMMARY  Patient Name: [name] MRN: [id number] DOB:Nov 20, 1965, 58 y.o., female Today's Date: 11/27/2023  PCP: Olive Bass, FNP REFERRING PROVIDER: Olive Bass, FNP  END OF SESSION:  OT End of Session - 11/27/23 1104     Visit Number 4    Number of Visits 6    Date for OT Re-Evaluation 11/29/23    Authorization Type [employer] 2025 Auth Not Reqd VL: MN    OT Start Time 1102    OT Stop Time 1158    OT Time Calculation (min) 56 min    Equipment Utilized During Treatment Paraffin, FM objects    Activity Tolerance Patient tolerated treatment well   low pulse rate   Behavior During Therapy WFL for tasks assessed/performed             Past Medical History:  Diagnosis Date   Abnormal Papanicolaou smear of cervix with positive human papilloma virus (HPV) test 02/2017   Anxiety    Asthma    Depression    Diabetes mellitus type II    Food intolerance    Headache    High blood pressure    IBS (irritable bowel syndrome)    Insomnia    Migraines    PCOS (polycystic ovarian syndrome)    PTSD (post-traumatic stress disorder)    Seasonal allergies    Thyroid disease    Past Surgical History:  Procedure Laterality Date   CHOLECYSTECTOMY     GALLBLADDER SURGERY     Patient Active Problem List   Diagnosis Date Noted   Class 1 obesity 12/22/2019   ASCUS with positive high risk HPV cervical 03/13/2017   Type 2 diabetes mellitus with hyperglycemia, without long-term current use of insulin (HCC) 03/28/2016   Routine general medical examination at a health care facility 01/24/2016   Acquired hypothyroidism 01/24/2016   Anemia 04/14/2014   Hyperlipidemia 08/18/2013   Essential hypertension 08/26/2012   Irregular menses 08/07/2012   History of abnormal Pap smear 08/07/2012   GERD (gastroesophageal reflux disease) 08/07/2012   Asthma, mild persistent 08/07/2012   Migraine 08/07/2012    History of PCOS 08/07/2012   Genital warts 08/07/2012   S/P cholecystectomy 08/07/2012   Seasonal and perennial allergic rhinitis 08/07/2012   Severe episode of recurrent major depressive disorder (HCC) 12/06/2011   PTSD (post-traumatic stress disorder) 11/02/2011    ONSET DATE: 09/24/2023  REFERRING DIAG: G56.03 (ICD-10-CM) - Carpal tunnel syndrome, bilateral "Reason for Referral: bilateral hand/ wrist pain"  THERAPY DIAG:  Muscle weakness (generalized)  Other lack of coordination  Pain in right hand  Pain in left hand  Rationale for Evaluation and Treatment: Rehabilitation  SUBJECTIVE:   SUBJECTIVE STATEMENT: Named pronounced SUH-ra  Pt reports feeling less steady today but not sure why.  Pt is aware that today is her last scheduled OT visit and she is satisfied with progress and in agreement with DC  Pt accompanied by: self  PERTINENT HISTORY:  Pt reports long history of issues on and off over the years related to carpal tunnel symptoms but no surgical intervention.  PMHx: Migraines, essential HTN, asthma, GERN, DM2, PTSD, depression  PRECAUTIONS: Other: Diabetic - FreeStyle Libre on arm  LATEX allergy  RED FLAGS: None   WEIGHT BEARING RESTRICTIONS: No  PAIN:  Are you having pain? Not really - maybe a 1/10  FALLS: Has patient fallen in last 6 months? No - but stumbles, catching herself on something before falling ~  maybe a couple of times/month  LIVING ENVIRONMENT: Lives with: lives alone - 2 dogs and cat; has tenants that rent rooms from her - share kitchen but separate bathrooms etc. Lives in: House Stairs: Yes: External: 3 steps; can reach both Has following equipment at home: Walker - 4 wheeled and has ordered a lift chair  PLOF: Independent - drives but not working/volunteering  PATIENT GOALS: Regain as much strength in my hands as possible  NEXT MD VISIT: NA   OBJECTIVE:  Note: Objective measures were completed at Evaluation unless otherwise  noted.  HAND DOMINANCE: Right  ADLs: WFL  FUNCTIONAL OUTCOME MEASURES: Quick Dash: 11.4  UPPER EXTREMITY ROM:    Generally full ROM of BUEs, slight end range limitations  but full composite flexion.  UPPER EXTREMITY MMT:     MMT Right eval Left eval  Shoulder flexion 3+ 3  Shoulder abduction    Shoulder adduction    Shoulder extension    Shoulder internal rotation    Shoulder external rotation    Middle trapezius    Lower trapezius    Elbow flexion    Elbow extension    Wrist flexion    Wrist extension    Wrist ulnar deviation    Wrist radial deviation    Wrist pronation    Wrist supination    (Blank rows = not tested)  HAND FUNCTION: Grip strength: Right: 40, 39, 40  lbs; Left: 35, 30, 27 lbs,  Average: Right 39.7 lbs Left 30.7 lbs  Lateral pinch: Right: 12 lbs, Left: 13  lbs,  3 point pinch: Right: 12 lbs, Left: 10 lbs,  Tip pinch: Right 10 lbs, Left: 10 lbs  11/27/23 Left 35, 28, 32, 40 Average 33.8 lbs Right 35, 37, 33, 37 Average 35.5  COORDINATION: 9 Hole Peg test: Right: 22.78  sec; Left: 27.33 sec  OTR noticed some tremors during FM testing.  Pt confirms issues and reports she think its worse on L side which seemed to fit with what was observed.  Pt notes its been there a couple of years and it's worse if she hasn't eaten etc.  SENSATION: WFL  EDEMA: NA  COGNITION: Overall cognitive status: Within functional limits for tasks assessed and No family/caregiver present to determine baseline cognitive functioning Areas of impairment:   OBSERVATIONS: Pt ambulated with no AE and loss of balance. The pt appears to have tremors in BUE L>R and is somewhat hypotonic in appearance.  TREATMENT DATE: 11/27/23                                                                                                                             Self Care: OT educated patient on sleep positioning as noted in patient instructions to reduce stress to upper extremity nerves,  which can affect sensation and pain in UEs. Patient verbalized understanding. Handout provided.  Reviewed goals for DC today as well as provision of DC instructions for long term joint  protection (pt reports she bought the jar opener), uses 2 hands for heavy items and takes breaks as needed.  In addition, she Is aware of wrist splints options if needed also.  Therapeutic Activities:  OT educated patient on use of paraffin bath.  Patient was informed of risks and benefits to treatment today and in agreement with trial.  Pt tested temperature prior to dipping hand/s and no concerns noted.  Patient tolerated paraffin bath to B hands with a towel wrap for 10 minutes during education re: coordination and jt protection principles.  Paraffin Bath was performed in preparation for ROM, strength and functional activities AND as trial for home modality consideration.  No redness, irritation or skin integrity concerns were noted before, during and/or after use.   Skin integrity prior to treatment: Intact. Skin integrity after treatment: Intact.  Information provided about possible home home modality equipment options for paraffin as well as reusable hand warmers.  Coordination Activity handout with images provided for various activities to work on B UE finger ROM, dexterity and isolated movements with demonstration and practice, as well as modifications to improve technique, digital isolation and ease of performing task.  Tasks included:  Pick up coins, dominoes, buttons, marbles, dice, objects of of different sizes ... To place in containers To stack - with guidance to work on include/isolate specific fingers. To pick up items one at a time until patient got 5+ in their hand and then move item from palm to fingertips to release ie) Finger-to-palm then palm-to-finger translation of small items - Options to vary difficulty include using a washcloth under items like coins or using larger items (checkers vs coins  or blocks/dominos vs dice) for increased ease of picking up items.  Shuffling, Flipping and dealing cards 1 at a time. -- Setup patient to work on dealing cards, focusing on using index finger with thumb to flip cards or holding deck of cards in palm of hand and push off 1 card at a time from the top of the deck using only thumb    Rotate golf balls (clockwise and counter-clockwise) with forearm pronated and balls on table or supinated and balls in hand.   Twirl pen/cil between fingers. - Encouragement to isolate fingers individually and "twirl" (rotation) or flipping and shift up and down the pen (translation) to get it in position for writing or erasing.    Tear a piece of paper towel and roll it into small balls with fingertips ie) straw wrapping when eating out.    Patient is encouraged to take breaks, relax arm/shoulder by supporting forearm, minimize compensatory motions and a try different activities throughout the day/week including games like Dorisann Frames (for the dice), card games, Connect 4 etc.   Patient benefited from extra time, verbal/tactile cues, and modeling of task to allow time for processing of verbal instructions and improve motor planning of unfamiliar movements.     PATIENT EDUCATION: Education details: Coordination activities and sleep positions  Person educated: Patient Education method: Explanation, Demonstration, Tactile cues, Verbal cues, and Handouts Education comprehension: verbalized understanding, returned demonstration, verbal cues required, tactile cues required, and needs further education  HOME EXERCISE PROGRAM: 10/24/23: Tendon Gliding Exercises 11/13/23: Putty Activities Access Code: [email or web address] and Joint protection education  GOALS: Goals reviewed with patient? Yes  LONG TERM GOALS: Target date: 11/29/23  Patient will demonstrate updated B UE HEP with visual handouts only for proper execution.  Baseline: New to outpt OT Goal status: MET  2.  Patient will  demonstrate at least 5+ lbs improvement in B grip strength as needed to open jars and other containers. Baseline: Average: Right 39.7 lbs Left 30.7 lbs Goal status: IN Progress  3.  Pt will independently recall at least 3 joint protection, ergonomics, and body mechanic principles as noted in pt instructions.   Baseline: New to outpt OT Goal status: MET Jar opener, aware of wrist splints, using 2 hands, taking breaks  4.  Pt will demonstrate B coordination, FM skills and activity tolerance to resume hobby r/t making jewelry/bead weaving 3+ times/week x 15+ minutes. Baseline: NA - frequently in bed per report to PT  Goal status: IN Progress  ASSESSMENT:  CLINICAL IMPRESSION: Patient is a 58 y.o. female who was seen today for occupational therapy treatment for B carpal tunnel syndrome/wrist/hand pain. Patient had good comfort with paraffin trial and noted some good challenge with coordination activities presented today.  Pt satisfied with progress and instruction to date and in agreement with DC from OT today.Marland Kitchen    PERFORMANCE DEFICITS: in functional skills including ADLs, coordination, dexterity, edema, tone, ROM, strength, pain, Fine motor control, Gross motor control, endurance, decreased knowledge of use of DME, and UE functional use, cognitive skills including attention, emotional, and temperament/personality, and psychosocial skills including coping strategies, environmental adaptation, habits, and routines and behaviors.   IMPAIRMENTS: are limiting patient from ADLs, IADLs, and leisure.   COMORBIDITIES: may have co-morbidities  that affects occupational performance. Patient will benefit from skilled OT to address above impairments and improve overall function.  REHAB POTENTIAL: Good  PLAN: OCCUPATIONAL THERAPY DISCHARGE SUMMARY  Visits from Start of Care: 4  Current functional level related to goals / functional outcomes: Pt has met/progressed in all goals to satisfactory levels  and is pleased with outcomes.  Remaining deficits: Pt has no more significant functional deficits or pain.   Education / Equipment: Pt has all needed materials and education. Pt understands how to continue on with self-management. See tx notes for more details.   Patient agrees to discharge due to max benefits received from outpatient occupational therapy at this time.     Victorino Sparrow, OT 11/27/2023, 12:21 PM

## 2023-11-27 NOTE — Patient Instructions (Addendum)
   WEBSITE: [email or web address]

## 2023-11-28 ENCOUNTER — Ambulatory Visit (INDEPENDENT_AMBULATORY_CARE_PROVIDER_SITE_OTHER): Payer: PPO | Admitting: Psychiatry

## 2023-11-28 ENCOUNTER — Ambulatory Visit: Payer: PPO | Admitting: Cardiology

## 2023-11-28 ENCOUNTER — Encounter (HOSPITAL_COMMUNITY): Payer: Self-pay | Admitting: Psychiatry

## 2023-11-28 VITALS — BP 155/66 | HR 60 | Ht 60.0 in | Wt 170.0 lb

## 2023-11-28 DIAGNOSIS — F5102 Adjustment insomnia: Secondary | ICD-10-CM | POA: Diagnosis not present

## 2023-11-28 DIAGNOSIS — F4310 Post-traumatic stress disorder, unspecified: Secondary | ICD-10-CM

## 2023-11-28 DIAGNOSIS — F411 Generalized anxiety disorder: Secondary | ICD-10-CM | POA: Diagnosis not present

## 2023-11-28 DIAGNOSIS — F3341 Major depressive disorder, recurrent, in partial remission: Secondary | ICD-10-CM | POA: Diagnosis not present

## 2023-11-28 MED ORDER — SERTRALINE HCL 100 MG PO TABS: ORAL_TABLET | ORAL | 0 refills | Status: DC

## 2023-11-28 MED ORDER — SERTRALINE HCL 100 MG PO TABS: 150.0000 mg | ORAL_TABLET | Freq: Every day | ORAL | 1 refills | Status: DC

## 2023-11-28 NOTE — Progress Notes (Signed)
 Patient ID: [name], female   DOB: Feb 23, 1966, 58 y.o.   MRN: [id number]   [facility] Follow-up Outpatient Visit  [name] [birth year] [id number] 58 y.o.  11/28/2023 12:06 PM     HPI Comments: [name] is  a 58  y/o female with a past psychiatric history significant for Post traumatic stress Disorder, Major Depressive Disorder, recurrent, severe and GAD.  Last visit was 4 months ago, she cancelled a prior visit before this  On overall has a roommate she gets rent Describes her mood is fair, denies she is manic when we discussed her primary care message She states there were demands I wanted and maybe that precipitated that concern but says she wanted to get schedule for OT / PT  She lost her mom prior year and had bad memories of childhood with her that sometimes still effect her and diagnosed with PTSD, says in therapy Still  has difficulty sleeping but doing herbal meds and allegra at times.  She understands the risk of herbal meds and if combined with other meds Depression is manageable but low energy and poor sleep, wakes up tired She did not started wellbutrin for seasonal depression.  Says was taking fish oil so that helped  Has been taking zoloft but she has reduced the dose to 150mg  Main concern today is her sleep that she focused on Still on low dose klonopine for anxiety  Her Dad is still in [place]   Learning diabetes control  Modifying factor: book reading Aggravating factor : family concerns in the past Severity fair   Past Medical Family, Social History:  Past Medical History:  Diagnosis Date   Abnormal Papanicolaou smear of cervix with positive human papilloma virus (HPV) test 02/2017   Anxiety    Asthma    Depression    Diabetes mellitus type II    Food intolerance    Headache    High blood pressure    IBS (irritable bowel syndrome)    Insomnia    Migraines    PCOS (polycystic ovarian syndrome)    PTSD (post-traumatic  stress disorder)    Seasonal allergies    Thyroid disease    Family History  Problem Relation Age of Onset   Bipolar disorder Mother    Hypertension Mother    Diabetes Mother    Alzheimer's disease Father    Hypertension Father    Alzheimer's disease Maternal Grandmother    Heart disease Paternal Grandfather    Social History   Socioeconomic History   Marital status: Single    Spouse name: Not on file   Number of children: 0   Years of education: 18   Highest education level: Master's degree (e.g., MA, MS, MEng, MEd, MSW, MBA)  Occupational History   Occupation: Long Term Disability    Comment: PTSD  Tobacco Use   Smoking status: Never   Smokeless tobacco: Never  Vaping Use   Vaping status: Never Used  Substance and Sexual Activity   Alcohol use: Yes    Alcohol/week: 1.0 standard drink of alcohol    Types: 1 Standard drinks or equivalent per week    Comment: 1 drink a month   Drug use: No   Sexual activity: Not Currently    Partners: Male    Birth control/protection: Abstinence    Comment: 1st intercourse- 16, partners more than 5  Other Topics Concern   Not on file  Social History Narrative   Regular exercise: yes, walk  Caffeine use: occasionally tea   Fun: Social group events, walking her dogs, movies   Denies abuse and feels safe where she lives.    Social Drivers of Corporate investment banker Strain: Low Risk  (01/08/2022)   Overall Financial Resource Strain (CARDIA)    Difficulty of Paying Living Expenses: Not hard at all  Food Insecurity: No Food Insecurity (01/08/2022)   Hunger Vital Sign    Worried About Running Out of Food in the Last Year: Never true    Ran Out of Food in the Last Year: Never true  Transportation Needs: No Transportation Needs (01/08/2022)   PRAPARE - Administrator, Civil Service (Medical): No    Lack of Transportation (Non-Medical): No  Physical Activity: Inactive (01/08/2022)   Exercise Vital Sign    Days of  Exercise per Week: 0 days    Minutes of Exercise per Session: 0 min  Stress: No Stress Concern Present (01/08/2022)   [employer] of Occupational Health - Occupational Stress Questionnaire    Feeling of Stress : Not at all  Social Connections: Socially Isolated (01/08/2022)   Social Connection and Isolation Panel [NHANES]    Frequency of Communication with Friends and Family: Twice a week    Frequency of Social Gatherings with Friends and Family: More than three times a week    Attends Religious Services: Never    Database administrator or Organizations: No    Attends Banker Meetings: Never    Marital Status: Never married  Intimate Partner Violence: Not At Risk (01/08/2022)   Humiliation, Afraid, Rape, and Kick questionnaire    Fear of Current or Ex-Partner: No    Emotionally Abused: No    Physically Abused: No    Sexually Abused: No    Outpatient Encounter Medications as of 11/28/2023  Medication Sig   albuterol (VENTOLIN HFA) 108 (90 Base) MCG/ACT inhaler INHALE 2 PUFFS BY MOUTH EVERY 4 TO 6 HOURS AS DIRECTED(RESCUE)   Alpha-Lipoic Acid 600 MG CAPS Take 300 mg by mouth 2 (two) times daily.    atenolol (TENORMIN) 100 MG tablet TAKE 1 TABLET(100 MG) BY MOUTH TWICE DAILY   Blood Glucose Monitoring Suppl (ONE TOUCH ULTRA MINI) w/Device KIT Use to check blood sugar 2 times a day.   Blood Glucose Monitoring Suppl (ONETOUCH VERIO) w/Device KIT Use as instructed to check blood sugar 4X daily   clobetasol cream (TEMOVATE) 0.05 % Apply 1 application topically 2 (two) times daily.   clonazePAM (KLONOPIN) 0.5 MG tablet Take 0.5 tablets (0.25 mg total) by mouth daily.   Continuous Glucose Receiver (FREESTYLE LIBRE 2 READER) DEVI 1 each by Does not apply route daily.   Continuous Glucose Sensor (FREESTYLE LIBRE 14 DAY SENSOR) MISC Use for continuous glucose monitoring.   Digestive Enzymes (ENZYME DIGEST PO) Take by mouth.   estradiol (ESTRACE VAGINAL) 0.1 MG/GM vaginal cream  Place 1 Applicatorful vaginally at bedtime. Use 3 times a week   fexofenadine (ALLEGRA) 180 MG tablet Take 120 mg by mouth in the morning and at bedtime.   fluvastatin XL (LESCOL XL) 80 MG 24 hr tablet TAKE 1 TABLET(80 MG) BY MOUTH DAILY   glucose blood (ONETOUCH ULTRA) test strip Use to check blood sugar once a day.   glucose blood (ONETOUCH VERIO) test strip Use as instructed to check blood sugar 4X daily   Insulin Aspart FlexPen (NOVOLOG) 100 UNIT/ML ADMINISTER 10 TO 30 UNITS UNDER THE SKIN THREE TIMES DAILY BEFORE MEALS  Insulin Pen Needle (BD PEN NEEDLE NANO 2ND GEN) 32G X 4 MM MISC USE 4 TIMES DAILY   irbesartan (AVAPRO) 75 MG tablet TAKE 1 TABLET(75 MG) BY MOUTH DAILY   LANTUS SOLOSTAR 100 UNIT/ML Solostar Pen Inject 20 Units into the skin daily.   levothyroxine (SYNTHROID) 100 MCG tablet TAKE 1 TABLET(100 MCG) BY MOUTH DAILY   methocarbamol (ROBAXIN) 500 MG tablet TAKE 1 TABLET(500 MG) BY MOUTH EVERY 8 HOURS AS NEEDED FOR MUSCLE SPASMS   NOVOLOG FLEXPEN 100 UNIT/ML FlexPen Inject 10-30 Units into the skin 3 (three) times daily with meals.   Olopatadine HCl (PATADAY OP) Apply to eye.   OVER THE COUNTER MEDICATION 1 capsule daily. Probiotic renew life    sertraline (ZOLOFT) 100 MG tablet TAKE 2 TABLETS BY MOUTH EVERY DAY   sertraline (ZOLOFT) 100 MG tablet Take 1.5 tablets (150 mg total) by mouth daily.   [DISCONTINUED] buPROPion ER (WELLBUTRIN SR) 100 MG 12 hr tablet Take 1 tablet (100 mg total) by mouth 2 (two) times daily.   [DISCONTINUED] EFFEXOR XR 150 MG 24 hr capsule 375 mg daily.    No facility-administered encounter medications on file as of 11/28/2023.     Physical Exam: Constitutional:  BP (!) 155/66 Comment: LATE TAKEN HER MEDICATION THIS AM  Pulse 60   Ht 5' (1.524 m)   Wt 170 lb (77.1 kg)   BMI 33.20 kg/m   Review of Systems  Cardiovascular:  Negative for chest pain.  Psychiatric/Behavioral:  Negative for depression, substance abuse and suicidal ideas. The  patient has insomnia.        Psychiatric Specialty exam: General Appearance:   Eye Contact::    Speech:  Clear and Coherent and Normal Rate  Volume:  Normal  Mood: fair  Affect:    Thought Process:  Coherent, Linear and Logical  Orientation:  Full (Time, Place, and Person)  Thought Content:  WDL  Suicidal Thoughts:  No  Homicidal Thoughts:  No  Memory:  Immediate;   Good Recent;   Good Remote;   Good  Judgement:  Fair  Insight:  Fair  Psychomotor Activity:  Normal  Concentration:  Good  Recall:  Good  Akathisia:  Negative  Language-Intact  Fund of knowledge-Average  Handed:  Right  AIMS (if indicated):     Assets:  Desire for Improvement Housing  Sleep:  Number of Hours: 1-9 hours     Assessment: Axis I: Post traumatic stress Disorder-Major Depressive Disorder, recurrent, severe- GAd            Prior documentation reviewed   1. Depression: maangeable continue zoloft She wants to increase it back to 200mg ,  not on wellbutrin     2. PTSD: working on triggers, continue zoloft  3. GAD:  fluctuates, increse z  4. Insomnia: reviewed sleep hygiene . Recommend sleep study as possible cause of fatigue, irregular sleep and need be ruled out Patient agrees with plan       Direct care time 20 minutes including face to face, documentation  and chart review    Royann Shivers, M.D.  11/28/2023 12:06 PM

## 2023-12-02 ENCOUNTER — Ambulatory Visit (INDEPENDENT_AMBULATORY_CARE_PROVIDER_SITE_OTHER): Payer: PPO | Admitting: Psychology

## 2023-12-02 DIAGNOSIS — F3341 Major depressive disorder, recurrent, in partial remission: Secondary | ICD-10-CM

## 2023-12-02 DIAGNOSIS — F411 Generalized anxiety disorder: Secondary | ICD-10-CM | POA: Diagnosis not present

## 2023-12-02 NOTE — Progress Notes (Signed)
 [facility] Counselor/Therapist Progress Note  Patient ID: BAYLEN DEA, MRN: [id number]    Date: 12/02/23  Time Spent: 2:01 pm -  2:49 pm : 48 Minutes  Treatment Type: Individual Therapy.  Reported Symptoms: Depression and Anxiety.   Mental Status Exam: Appearance:  Casual     Behavior: Appropriate  Motor: Normal  Speech/Language:  Clear and Coherent  Affect: Appropriate  Mood: dysthymic  Thought process: normal  Thought content:   WNL  Sensory/Perceptual disturbances:   WNL  Orientation: oriented to person, place, time/date, and situation  Attention: Good  Concentration: Good  Memory: WNL  Fund of knowledge:  Good  Insight:   Good  Judgment:  Good  Impulse Control: Good   Risk Assessment: Danger to Self:  No Self-injurious Behavior: No Danger to Others: No Duty to Warn:no Physical Aggression / Violence:No  Access to Firearms a concern: No  Gang Involvement:No   Subjective:   PAULINE PEGUES participated in the session from  the office, and consented to treatment with the therapist. Flower noted the events of the past week.She noted a recent meeting with her PCP and noted this resulting in her PCP sending a message to [facility] psychiatric provider that Ambera was agitated. She noted that she was not agitated but was attempting to give her medical provider in-depth history of her health in relation to her request to OT and PT and having gotten little sleep due to sleep issues.   She noted that her psychiatric provider expressed frustration regarding Byanca editing her dose of Zoloft from 200 to 150 to aid her sleep. She noted her psychiatric provider's approach being frustrating for her. We worked on exploring this during the session and she noted the approach being part of the issue. She noted having supportive psychiatric providers and those who were not supportive, in the past. She noted a lack of trust with her provider from past interactions. We worked on  processed this during the session. She requested in a sleep aid and being recommended for a sleep study. She noted apprehension regarding this due to being away from her pets, which causes her anxiety. We explored this and Laurence noted it would be a reasonable goal to contact the practice and discuss what the sleep study would entail. Therapist praised Junie for her insight, provided psycho-education regarding anxiety, and encouraged this approach going forward. Therapist validated Joylene's feelings and experience, challenged negative cognitions, and encouraged continued effort in this area. Therapist provided supportive therapy and a follow-up was scheduled for continued treatment.  Interventions: CBT & Interpersonal  Diagnosis:    Major depressive disorder, recurrent episode, in partial remission (HCC)  GAD (generalized anxiety disorder)  Psychiatric Treatment: Yes , Dr. Fredda Hammed.  See chart.   Treatment Plan:  Client Abilities/Strengths Luisa is intelligent, well spoken, and motivated for change.   Support System: Friends from church.   Client Treatment Preferences OPT  Client Statement of Needs Crissy would like to increase motivation and task, becoming more social and date, managing overall mood, processing past events, bolstering coping skills, managing overall symptoms, & increasing mindfulness.     Treatment Level Weekly  Symptoms  Depression:  Loss of interest, feeling down, poor sleep, lethargy, poor appetite, difficulty concentrating  (Status: maintained) Anxiety: feeling nervous, worrying about different things, trouble relaxing, irritability, feeling afraid as if something awful might happen, anxiety leaving the home.   (Status: maintained)  Goals:   Elfida experiences symptoms of anxiety, depression, and PTSD.  Target Date: 12/10/23 Frequency: Weekly  Progress: 0 Modality: individual    Therapist will provide referrals for additional resources as appropriate.  Therapist  will provide psycho-education regarding Lynne's diagnosis and corresponding treatment approaches and interventions. Licensed Clinical Social Worker, [place], [name] will support the patient's ability to achieve the goals identified. will employ CBT, BA, Problem-solving, Solution Focused, Mindfulness,  coping skills, & other evidenced-based practices will be used to promote progress towards healthy functioning to help manage decrease symptoms associated with her diagnosis.   Reduce overall level, frequency, and intensity of the feelings of depression & anxiety evidenced by decreased overall symptoms from 6 to 7 days/week to 0 to 1 days/week per client report for at least 3 consecutive months. Verbally express understanding of the relationship between feelings of depression, anxiety and their impact on thinking patterns and behaviors. Verbalize an understanding of the role that distorted thinking plays in creating fears, excessive worry, and ruminations.  [name] participated in the creation of the treatment plan)    [name], [name]

## 2023-12-03 ENCOUNTER — Ambulatory Visit: Payer: PPO | Admitting: Internal Medicine

## 2023-12-03 ENCOUNTER — Telehealth (HOSPITAL_COMMUNITY): Payer: Self-pay | Admitting: Psychiatry

## 2023-12-03 ENCOUNTER — Ambulatory Visit (INDEPENDENT_AMBULATORY_CARE_PROVIDER_SITE_OTHER): Payer: PPO | Admitting: Internal Medicine

## 2023-12-03 ENCOUNTER — Encounter: Payer: Self-pay | Admitting: Internal Medicine

## 2023-12-03 VITALS — BP 120/70 | HR 55 | Ht 60.0 in | Wt 173.6 lb

## 2023-12-03 DIAGNOSIS — E782 Mixed hyperlipidemia: Secondary | ICD-10-CM | POA: Diagnosis not present

## 2023-12-03 DIAGNOSIS — E1165 Type 2 diabetes mellitus with hyperglycemia: Secondary | ICD-10-CM

## 2023-12-03 DIAGNOSIS — Z794 Long term (current) use of insulin: Secondary | ICD-10-CM

## 2023-12-03 DIAGNOSIS — E039 Hypothyroidism, unspecified: Secondary | ICD-10-CM

## 2023-12-03 LAB — POCT GLYCOSYLATED HEMOGLOBIN (HGB A1C): Hemoglobin A1C: 6.7 % — AB (ref 4.0–5.6)

## 2023-12-03 MED ORDER — LANTUS SOLOSTAR 100 UNIT/ML ~~LOC~~ SOPN: 26.0000 [IU] | PEN_INJECTOR | Freq: Every day | SUBCUTANEOUS | Status: DC

## 2023-12-03 MED ORDER — CLONAZEPAM 0.5 MG PO TABS: 0.2500 mg | ORAL_TABLET | Freq: Every day | ORAL | 1 refills | Status: DC

## 2023-12-03 NOTE — Addendum Note (Signed)
 Addended by: Pollie Meyer on: 12/03/2023 01:50 PM   Modules accepted: Orders

## 2023-12-03 NOTE — Progress Notes (Signed)
 Patient ID: [name], female   DOB: 12-01-1965, 58 y.o.   MRN: [id number]  HPI: [name] is a 58 y.o.-year-old female, returning for f/u for DM2, dx 2013, insulin-dependent, uncontrolled, with complications (diabetic retinopathy) and also hypothyroidism. Last visit 4 months ago.  Interim history: No increased urination, blurry vision, chest pain.  She does have some nausea, which is not new.  No acid reflux. She continues to crave sweets. She started fish oil before last visit.  She feels much better on this. She has PT/OT for wrist and neck pain - this worked.  DM2: Reviewed HbA1c levels: Lab Results  Component Value Date   HGBA1C 6.6 (A) 07/26/2023   HGBA1C 6.5 03/19/2023   HGBA1C 6.1 (A) 11/02/2022   Pt is on a regimen of: - Lantus 12 >> ... 20 >> 25 >> 20 >> 20 units at bedtime >> 24 >> 20 units in am - NovoLog 15-20 units 15 minutes before each meal, 24-26 >> 10-12 units for protein shake; 16-17 units before a regular meal; 22-25 for a high carb meal >>  10-12 units before a smaller meal 16-17 units before a regular meal 22-26 units before a high-carb meal If you take insulin after the meal, take 2/3 of the insulin dose depending on the time since the meal. - Sliding scale for NovoLog: - 150- 165: + 1 unit  - 166- 180: + 2 units  - 181- 195: + 3 units  - 196- 210: + 4 units  - 211- 225: + 5 units  - 226- 240: + 6 units  - >240 + 7 units  We stopped Cycloset in 06/2020. She did not start Welchol 2 tabs 2x a day - added 03/2017 - too expensive On Turmeric, alpha-lipoic acid + 660 mcg biotin per day, Garlicin. We stopped Glipizide when we started NovoLog.  She has various intolerances to different diabetic medicines: Metformin ER >> in the past, she had gastric discomfort and severe diarrhea (loss of bowel control) Januvia >> tried for 1-2 months >> left chest pain She was wondering if she could take Invokana, however she had multiple vaginal yeast infections.  She Also had nocturia 3-4 x a night.  She had reactive hypoglycemia symptoms while on Quercetin (was taking it for allergies)  She stopped Rybelsus 7 mg >> stopped 2/2 N and D   She checks her sugars more than 4 times a day with her libre CGM (receiver):  Previously:  Previously:   Lowest: LO x2 >> .Marland Kitchen. 40 >> 50 >> 63 (at night). Highest: 455 >> .Marland Kitchen.  350 >> 300s.  She has many food intolerances: - Gluten >> cannot eat bread or pastry.  - raw vegetables, beans, cruciferous vegetables, fruit >> diarrhea.  - No dairy but, cereals  -at night -now off ice cream In the past, no she was taking up to 36 units of NovoLog for ice cream.  I strongly advised her to stop this in the past. She does not digest fat well after her cholecystectomy in 2011.   Meter: One Touch Mini  No CKD, last BUN/creatinine was:  Lab Results  Component Value Date   BUN 24 (H) 07/26/2023   CREATININE 1.01 07/26/2023   Lab Results  Component Value Date   MICRALBCREAT 9.4 07/26/2023   MICRALBCREAT 6.5 02/22/2022   MICRALBCREAT 5.2 03/22/2020   MICRALBCREAT 8.6 03/31/2019   MICRALBCREAT 1.1 08/26/2015  On irbesartan.  + HL: Lab Results  Component Value Date  CHOL 260 (H) 07/26/2023   HDL 82.10 07/26/2023   LDLCALC 160 (H) 07/26/2023   LDLDIRECT 205.0 01/24/2016   TRIG 92.0 07/26/2023   CHOLHDL 3 07/26/2023  Previous cholesterol levels were: 325/197/132/154.   She could not tolerate pravastatin or Livalo due to muscle aches.  She refused a referral to lipid clinic in the past for PCSK9 inhibitors.  On fluvastatin XL + CoQ10. On Fish oil now.  I suggested Zetia but she refused due to the possibility of developing diarrhea on it. At last visit I suggested Red Yeast Rice try 600 mg twice a day >> did not try it yet.  - last dilated eye exam was on 07/08/2023: + DR. Dr. Marvis Moeller.   - + Numbness and tingling in her legs.  She is on the R enantiomer of alpha-lipoic acid.  Last foot exam 07/26/2023.  She also  has a history of PCOS- she has seen Dr. Rosine [facility] at [facility] in the past - note from 08/26/2012: perimenopause, and at that time she was on Yasmin, subsequently changed to Necon. She sees Dr. Marvel Plan with OB/GYN. She has PTSD, MDD, insomnia, anxiety, asthma, HTN, GERD, status post cholecystectomy, anemia, transaminitis. She also had increased uterine bleeding (on Necon). She sawan acupuncturist. She takes Nettle powder >> helped her with diarrhea, anemia, insomnia. 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 Also, Undecylenic acid and mastic gum, no carbs x 1-2 months.  She has IBS >> better on L- plantarum, S.bulardii - Brevibacillus Laterosporus 20 min before b'fast.   Hypothyroidism  -Diagnosed in 02/2018. We started levothyroxine 03/2018.  Pt is on levothyroxine 100 mcg daily: - in am >> but wakes up late >> missing it 50% of the time - fasting - at least 30 min from b'fast - + calcium in butyrate later in the day - no iron - no multivitamins - no PPIs - not on Biotin  Reviewed her TFTs: Lab Results  Component Value Date   TSH 6.85 (H) 07/26/2023   TSH 1.78 09/27/2022   TSH 6.06 (H) 06/27/2022   TSH 8.33 (H) 02/22/2022   TSH 2.02 02/06/2021   TSH 2.82 07/01/2020   TSH 6.44 (H) 03/22/2020   TSH 2.96 04/01/2019   TSH 1.26 07/30/2018   TSH 10.64 (H) 04/04/2018   FREET4 0.80 07/26/2023   FREET4 0.88 06/27/2022   FREET4 0.91 02/22/2022   FREET4 0.63 02/06/2021   FREET4 0.78 07/01/2020   FREET4 0.91 03/22/2020   FREET4 0.60 04/01/2019   FREET4 0.84 07/30/2018   FREET4 0.72 04/04/2018   T3FREE 2.6 04/04/2018   Her antithyroid antibodies were not elevated: Component     Latest Ref Rng & Units 04/04/2018  Thyroglobulin Ab     < or = 1 IU/mL <1  Thyroperoxidase Ab SerPl-aCnc     <9 IU/mL 2   No FH of thyroid cancer. No h/o radiation tx to head or neck. No Biotin use. No recent steroids use.   Pt denies: - feeling nodules  in neck - hoarseness - dysphagia - choking  She takes vitamin D 5000 units.  ROS: + See HPI  I reviewed pt's medications, allergies, PMH, social hx, family hx, and changes were documented in the history of present illness. Otherwise, unchanged from my initial visit note.  Past Medical History:  Diagnosis Date   Abnormal Papanicolaou smear of cervix with positive human papilloma virus (HPV) test 02/2017   Anxiety    Asthma  Depression    Diabetes mellitus type II    Food intolerance    Headache    High blood pressure    IBS (irritable bowel syndrome)    Insomnia    Migraines    PCOS (polycystic ovarian syndrome)    PTSD (post-traumatic stress disorder)    Seasonal allergies    Thyroid disease    Past Surgical History:  Procedure Laterality Date   CHOLECYSTECTOMY     GALLBLADDER SURGERY     Social History   Socioeconomic History   Marital status: Single    Spouse name: Not on file   Number of children: 0   Years of education: 84   Highest education level: Master's degree (e.g., MA, MS, MEng, MEd, MSW, MBA)  Occupational History   Occupation: Long Term Disability    Comment: PTSD  Tobacco Use   Smoking status: Never   Smokeless tobacco: Never  Vaping Use   Vaping status: Never Used  Substance and Sexual Activity   Alcohol use: Yes    Alcohol/week: 1.0 standard drink of alcohol    Types: 1 Standard drinks or equivalent per week    Comment: 1 drink a month   Drug use: No   Sexual activity: Not Currently    Partners: Male    Birth control/protection: Abstinence    Comment: 1st intercourse- 16, partners more than 5  Other Topics Concern   Not on file  Social History Narrative   Regular exercise: yes, walk   Caffeine use: occasionally tea   Fun: Social group events, walking her dogs, movies   Denies abuse and feels safe where she lives.    Social Drivers of Corporate investment banker Strain: Low Risk  (01/08/2022)   Overall Financial Resource Strain  (CARDIA)    Difficulty of Paying Living Expenses: Not hard at all  Food Insecurity: No Food Insecurity (01/08/2022)   Hunger Vital Sign    Worried About Running Out of Food in the Last Year: Never true    Ran Out of Food in the Last Year: Never true  Transportation Needs: No Transportation Needs (01/08/2022)   PRAPARE - Administrator, Civil Service (Medical): No    Lack of Transportation (Non-Medical): No  Physical Activity: Inactive (01/08/2022)   Exercise Vital Sign    Days of Exercise per Week: 0 days    Minutes of Exercise per Session: 0 min  Stress: No Stress Concern Present (01/08/2022)   [employer] of Occupational Health - Occupational Stress Questionnaire    Feeling of Stress : Not at all  Social Connections: Socially Isolated (01/08/2022)   Social Connection and Isolation Panel [NHANES]    Frequency of Communication with Friends and Family: Twice a week    Frequency of Social Gatherings with Friends and Family: More than three times a week    Attends Religious Services: Never    Database administrator or Organizations: No    Attends Banker Meetings: Never    Marital Status: Never married  Intimate Partner Violence: Not At Risk (01/08/2022)   Humiliation, Afraid, Rape, and Kick questionnaire    Fear of Current or Ex-Partner: No    Emotionally Abused: No    Physically Abused: No    Sexually Abused: No   Current Outpatient Medications on File Prior to Visit  Medication Sig Dispense Refill   albuterol (VENTOLIN HFA) 108 (90 Base) MCG/ACT inhaler INHALE 2 PUFFS BY MOUTH EVERY 4 TO 6 HOURS AS  DIRECTED(RESCUE) 18 g 0   Alpha-Lipoic Acid 600 MG CAPS Take 300 mg by mouth 2 (two) times daily.      atenolol (TENORMIN) 100 MG tablet TAKE 1 TABLET(100 MG) BY MOUTH TWICE DAILY 180 tablet 3   Blood Glucose Monitoring Suppl (ONE TOUCH ULTRA MINI) w/Device KIT Use to check blood sugar 2 times a day. 1 kit 0   Blood Glucose Monitoring Suppl (ONETOUCH VERIO)  w/Device KIT Use as instructed to check blood sugar 4X daily 1 kit 0   clobetasol cream (TEMOVATE) 0.05 % Apply 1 application topically 2 (two) times daily. 30 g 1   clonazePAM (KLONOPIN) 0.5 MG tablet Take 0.5 tablets (0.25 mg total) by mouth daily. 15 tablet 0   Continuous Glucose Receiver (FREESTYLE LIBRE 2 READER) DEVI 1 each by Does not apply route daily. 1 each 0   Continuous Glucose Sensor (FREESTYLE LIBRE 14 DAY SENSOR) MISC Use for continuous glucose monitoring. 6 each 3   Digestive Enzymes (ENZYME DIGEST PO) Take by mouth.     estradiol (ESTRACE VAGINAL) 0.1 MG/GM vaginal cream Place 1 Applicatorful vaginally at bedtime. Use 3 times a week 42.5 g 12   fexofenadine (ALLEGRA) 180 MG tablet Take 120 mg by mouth in the morning and at bedtime.     fluvastatin XL (LESCOL XL) 80 MG 24 hr tablet TAKE 1 TABLET(80 MG) BY MOUTH DAILY 90 tablet 3   glucose blood (ONETOUCH ULTRA) test strip Use to check blood sugar once a day. 100 strip 11   glucose blood (ONETOUCH VERIO) test strip Use as instructed to check blood sugar 4X daily 300 each 2   Insulin Aspart FlexPen (NOVOLOG) 100 UNIT/ML ADMINISTER 10 TO 30 UNITS UNDER THE SKIN THREE TIMES DAILY BEFORE MEALS 90 mL 1   Insulin Pen Needle (BD PEN NEEDLE NANO 2ND GEN) 32G X 4 MM MISC USE 4 TIMES DAILY 400 each 3   irbesartan (AVAPRO) 75 MG tablet TAKE 1 TABLET(75 MG) BY MOUTH DAILY 90 tablet 3   LANTUS SOLOSTAR 100 UNIT/ML Solostar Pen Inject 20 Units into the skin daily. 30 mL 3   levothyroxine (SYNTHROID) 100 MCG tablet TAKE 1 TABLET(100 MCG) BY MOUTH DAILY 90 tablet 3   methocarbamol (ROBAXIN) 500 MG tablet TAKE 1 TABLET(500 MG) BY MOUTH EVERY 8 HOURS AS NEEDED FOR MUSCLE SPASMS 60 tablet 1   NOVOLOG FLEXPEN 100 UNIT/ML FlexPen Inject 10-30 Units into the skin 3 (three) times daily with meals. 90 mL 0   Olopatadine HCl (PATADAY OP) Apply to eye.     OVER THE COUNTER MEDICATION 1 capsule daily. Probiotic renew life      sertraline (ZOLOFT) 100 MG  tablet TAKE 2 TABLETS BY MOUTH EVERY DAY 60 tablet 0   [DISCONTINUED] EFFEXOR XR 150 MG 24 hr capsule 375 mg daily.      No current facility-administered medications on file prior to visit.   Allergies  Allergen Reactions   Latex Itching and Swelling   Fluticasone     Nose bleeds   Metformin And Related     GI side effects   Pravastatin     myalgias   Family History  Problem Relation Age of Onset   Bipolar disorder Mother    Hypertension Mother    Diabetes Mother    Alzheimer's disease Father    Hypertension Father    Alzheimer's disease Maternal Grandmother    Heart disease Paternal Grandfather    PE: BP 120/70   Pulse (!) 55  Ht 5' (1.524 m)   Wt 173 lb 9.6 oz (78.7 kg)   SpO2 97%   BMI 33.90 kg/m    Wt Readings from Last 3 Encounters:  12/03/23 173 lb 9.6 oz (78.7 kg)  09/24/23 173 lb 3.2 oz (78.6 kg)  08/20/23 173 lb 6.4 oz (78.7 kg)   Constitutional: overweight, in NAD Eyes:  EOMI, no exophthalmos ENT: no neck masses, no cervical lymphadenopathy Cardiovascular: RRR, No MRG Respiratory: CTA B Musculoskeletal: no deformities Skin: + Rash on forearms-healed on left arm but still with violaceous maculae on the right arm Neurological: +tremor with outstretched hands  ASSESSMENT: 1. DM2, insulin-dependent, fairly well controlled, with complications - DR  2. HL - Very high LDL - high HDL - slightly high TG  3.  Hypothyroidism  PLAN:  1. DM2 - Patient with insulin-dependent type 2 diabetes, with slightly worsened HbA1c at last visit due to dietary indiscretions-she was having many sweets including cookies at night.  When the sugars were too high after a meal, she was taking up to 20 units of insulin to correct these.  We discussed that this was too much and I gave her a sliding scale.  HbA1c at that time was slightly higher, at 6.6%. CGM interpretation: -At today's visit, we reviewed her CGM downloads: It appears that 47% of values are in target range  (goal >70%), while 52% are higher than 180 (goal <25%), and 1% are lower than 70 (goal <4%).  The calculated average blood sugar is 181.  The projected HbA1c for the next 3 months (GMI) is 7.6%. -Reviewing the CGM trends, sugars appear to be worse, fluctuating around the upper limit of the target range but with higher blood sugars after each meal.  Sugars improved around midnight but she has snacks afterwards and sugars worsened again.  At today's visit, we discussed about the importance of improving diet and reducing sweets but will also increase her Lantus dose to bring her blood sugars lower in the target range. - I advised her to:  Patient Instructions  Please increase: - Lantus 26 units in am  Continue: - Novolog  10-12 units before a smaller meal 16-17 units before a regular meal 22-26 units before a high-carb meal If you take insulin after the meal, take 2/3 of the insulin dose depending on the time since the meal. - Sliding scale for NovoLog: - 150- 165: + 1 unit  - 166- 180: + 2 units  - 181- 195: + 3 units  - 196- 210: + 4 units  - 211- 225: + 5 units  - 226- 240: + 6 units  - >240 + 7 units    Try Red Yeast Rice 600 mg 2x a day.   Please continue: - Levothyroxine 100 mg daily  Take the thyroid hormone every day, with water, at least 30 minutes before breakfast, separated by at least 4 hours from: - acid reflux medications - calcium - iron - multivitamins   Please return in 4 months.  - we checked her HbA1c: 6.7% (higher than at last visit, but lower than expected from her CGM) - advised to check sugars at different times of the day - 4x a day, rotating check times - advised for yearly eye exams >> she is UTD - return to clinic in 4 months  2. HL -Latest lipid panel was reviewed: She continues to have a very high LDL: Lab Results  Component Value Date   CHOL 260 (H) 07/26/2023  HDL 82.10 07/26/2023   LDLCALC 160 (H) 07/26/2023   LDLDIRECT 205.0 01/24/2016    TRIG 92.0 07/26/2023   CHOLHDL 3 07/26/2023  -She previous she could not tolerate Livalo. I Advised her to try Zetia but she declined that she read that this could cause diarrhea. -She declined a referral to the lipid clinic for PCSK9 inhibitors -She continues to take fluvastatin XL inconsistently -She used to eat ice cream and other sweets - she started fish oil 2400 mg daily -At last visit I suggested red yeast rice 600 mg twice a day - did not start but plans to do so  3.   Hypothyroidism - latest thyroid labs reviewed with pt. >> TSH was elevated: Lab Results  Component Value Date   TSH 6.85 (H) 07/26/2023  - she continues on LT4 100 mcg daily - pt feels good on this dose. - we discussed about taking the thyroid hormone every day, with water, >30 minutes before breakfast, separated by >4 hours from acid reflux medications, calcium, iron, multivitamins. Pt. is missing approximately 50% of the doses, reportedly, as she stays up at night and goes to bed late.  She is not taking it later in the day but I advised her that she can take it at waking up, even later in the day. - will check thyroid tests next time: TSH and fT4  Carlus Pavlov, MD PhD [facility] End[facility]

## 2023-12-03 NOTE — Telephone Encounter (Signed)
 Patient calling for refill on clonazePAM [facility]) 0.5 MG tablet     [id number] [place], [place] - [address]    Next Appt  01/23/24 Last Appt  11/28/23

## 2023-12-03 NOTE — Patient Instructions (Addendum)
 Please increase: - Lantus 26 units in am  Continue: - Novolog  10-12 units before a smaller meal 16-17 units before a regular meal 22-26 units before a high-carb meal If you take insulin after the meal, take 2/3 of the insulin dose depending on the time since the meal. - Sliding scale for NovoLog: - 150- 165: + 1 unit  - 166- 180: + 2 units  - 181- 195: + 3 units  - 196- 210: + 4 units  - 211- 225: + 5 units  - 226- 240: + 6 units  - >240 + 7 units    Try Red Yeast Rice 600 mg 2x a day.   Please continue: - Levothyroxine 100 mg daily  Take the thyroid hormone every day, with water, at least 30 minutes before breakfast, separated by at least 4 hours from: - acid reflux medications - calcium - iron - multivitamins   Please return in 4 months.

## 2023-12-17 ENCOUNTER — Ambulatory Visit (INDEPENDENT_AMBULATORY_CARE_PROVIDER_SITE_OTHER): Payer: PPO | Admitting: Psychology

## 2023-12-17 DIAGNOSIS — F411 Generalized anxiety disorder: Secondary | ICD-10-CM | POA: Diagnosis not present

## 2023-12-17 DIAGNOSIS — F4310 Post-traumatic stress disorder, unspecified: Secondary | ICD-10-CM

## 2023-12-17 DIAGNOSIS — F3341 Major depressive disorder, recurrent, in partial remission: Secondary | ICD-10-CM | POA: Diagnosis not present
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
## 2023-12-24 ENCOUNTER — Ambulatory Visit (HOSPITAL_COMMUNITY): Payer: PPO | Admitting: Psychiatry

## 2023-12-31 ENCOUNTER — Ambulatory Visit (INDEPENDENT_AMBULATORY_CARE_PROVIDER_SITE_OTHER): Payer: PPO | Admitting: Psychology

## 2023-12-31 DIAGNOSIS — F4310 Post-traumatic stress disorder, unspecified: Secondary | ICD-10-CM

## 2023-12-31 DIAGNOSIS — F3341 Major depressive disorder, recurrent, in partial remission: Secondary | ICD-10-CM | POA: Diagnosis not present

## 2023-12-31 DIAGNOSIS — F411 Generalized anxiety disorder: Secondary | ICD-10-CM | POA: Diagnosis not present

## 2023-12-31 NOTE — Progress Notes (Signed)
 [facility] Counselor/Therapist Progress Note  Patient ID: [name], MRN: [id number]    Date: 12/31/23  Time Spent: 1:04  pm - 1:54  pm : 50 Minutes  Treatment Type: Individual Therapy.  Reported Symptoms: Depression, anxiety, and PTSD.   Mental Status Exam: Appearance:  Neat and Well Groomed     Behavior: Appropriate  Motor: Normal  Speech/Language:  Clear and Coherent  Affect: Appropriate  Mood: dysthymic  Thought process: normal  Thought content:   WNL  Sensory/Perceptual disturbances:   WNL  Orientation: oriented to person, place, time/date, and situation  Attention: Good  Concentration: Good  Memory: WNL  Fund of knowledge:  Good  Insight:   Good  Judgment:  Good  Impulse Control: Good   Risk Assessment: Danger to Self:  No Self-injurious Behavior: No Danger to Others: No Duty to Warn:no Physical Aggression / Violence:No  Access to Firearms a concern: No  Gang Involvement:No   Subjective:   [name] participated in the session, in person in the office with the therapist, and consented to treatment [name] reviewed the events of the past week. We reviewed numerous treatment approaches including CBT, BA, Problem Solving, and Solution focused therapy. Psych-education regarding the [name]'s diagnosis of Major depressive disorder, recurrent episode, in partial remission (HCC)  GAD (generalized anxiety disorder)  Posttraumatic stress disorder was provided during the session. We discussed [name]'s goals treatment goals which include  increase frequency of socialization, building local support group, engaging in consistent self-care including exercise, addressing difficulty with engagement in activities, improving her overall health, processing past events, processing losses (mother, partner), managing symptoms. . [name] provided verbal approval of the treatment plan.    Interventions: Psycho-education & Goal Setting.   Diagnosis:    Major depressive disorder, recurrent episode, in partial remission (HCC)  GAD (generalized anxiety disorder)  Posttraumatic stress disorder  Psychiatric Treatment: Yes , Dr. Aktar. Please see chart for medication list. She continues to see his psychiatric provider regularly.   Treatment Plan:  Client Abilities/Strengths [name] is intelligent, self-aware, and motivated for change.    Support System: Father in [place]. Friends in area.   Client Treatment Preferences Outpatient Therapy.   Client Statement of Needs [name] would like to increase frequency of socialization, building local support group, engaging in consistent self-care including exercise, addressing difficulty with engagement in activities including health barriers, improving her overall health, processing past events, processing losses (mother, partner), managing symptoms, balancing responsibilities of day-to-day life.     Treatment Level Weekly  Symptoms  Anxiety: Feeling anxious, difficulty managing worry, worrying about different things, trouble relaxing, restlessness, irritability, and feeling afraid something awful might happen.    (Status: maintained) Depression: Feeling down, trouble sleeping, lethargy, over eating, difficulty with concentration.    (Status: maintained)  Goals:   [name] experiences symptoms of depression, anxiety, and PTSD.   Treatment plan signed and available on s-drive:  No, pending signature.    Target Date: 12/30/24 Frequency: Weekly  Progress: 0 Modality: individual    Therapist will provide referrals for additional resources as appropriate.  Therapist will provide psycho-education regarding [name]'s diagnosis and corresponding treatment approaches and interventions. Abigail Boyden, [name] will support the patient's ability to achieve the goals identified. will employ CBT, BA, Problem-solving, Solution Focused, Mindfulness,  coping skills, & other evidenced-based practices will be used to promote  progress towards healthy functioning to help manage decrease symptoms associated with her diagnosis.   Reduce overall level, frequency, and intensity  of the feelings of depression and anxiety evidenced by decreased overall symptoms from 6 to 7 days/week to 0 to 1 days/week per client report for at least 3 consecutive months. Verbally express understanding of the relationship between feelings of depression, anxiety and their impact on thinking patterns and behaviors. Verbalize an understanding of the role that distorted thinking plays in creating fears, excessive worry, and ruminations.  [name] participated in the creation of the treatment plan)    Abigail Boyden, [name]

## 2024-01-09 ENCOUNTER — Telehealth: Payer: Self-pay

## 2024-01-09 DIAGNOSIS — E1165 Type 2 diabetes mellitus with hyperglycemia: Secondary | ICD-10-CM

## 2024-01-09 MED ORDER — NOVOLOG FLEXPEN 100 UNIT/ML ~~LOC~~ SOPN: 10.0000 [IU] | PEN_INJECTOR | Freq: Three times a day (TID) | SUBCUTANEOUS | 2 refills | Status: DC

## 2024-01-09 MED ORDER — LANTUS SOLOSTAR 100 UNIT/ML ~~LOC~~ SOPN: 26.0000 [IU] | PEN_INJECTOR | Freq: Every day | SUBCUTANEOUS | 1 refills | Status: DC

## 2024-01-09 NOTE — Telephone Encounter (Signed)
 Requested Prescriptions   Signed Prescriptions Disp Refills   LANTUS  SOLOSTAR 100 UNIT/ML Solostar Pen 45 mL 1    Sig: Inject 26 Units into the skin daily.    Authorizing Provider: Emilie Harden    Ordering User: Vernon Goodpasture   NOVOLOG  FLEXPEN 100 UNIT/ML FlexPen 90 mL 2    Sig: Inject 10-30 Units into the skin 3 (three) times daily with meals.    Authorizing Provider: Emilie Harden    Ordering User: Vernon Goodpasture

## 2024-01-14 ENCOUNTER — Ambulatory Visit (INDEPENDENT_AMBULATORY_CARE_PROVIDER_SITE_OTHER): Admitting: Psychology

## 2024-01-14 DIAGNOSIS — F3341 Major depressive disorder, recurrent, in partial remission: Secondary | ICD-10-CM

## 2024-01-14 DIAGNOSIS — F411 Generalized anxiety disorder: Secondary | ICD-10-CM

## 2024-01-14 NOTE — Progress Notes (Signed)
 [name]  Patient ID: [name], MRN: [id number]    Date: 01/14/24  Time Spent: 1:03  pm - 1:55  pm : 52 Minutes  Treatment Type: Individual Therapy.  Reported Symptoms: Depression, anxiety, and PTSD.   Mental Status Exam: Appearance:  Neat and Well Groomed     Behavior: Appropriate  Motor: Normal  Speech/Language:  Clear and Coherent  Affect: Appropriate  Mood: dysthymic  Thought process: normal  Thought content:   WNL  Sensory/Perceptual disturbances:   WNL  Orientation: oriented to person, place, time/date, and situation  Attention: Good  Concentration: Good  Memory: WNL  Fund of knowledge:  Good  Insight:   Good  Judgment:  Good  Impulse Control: Good   Risk Assessment: Danger to Self:  No Self-injurious Behavior: No Danger to Others: No Duty to Warn:no Physical Aggression / Violence:No  Access to Firearms a concern: No  Gang Involvement:No   Subjective:   [name] participated in the session, in person in the office with the therapist, and consented to treatment [name] reviewed the events of the past week. She noted an increase in anxiety due to a bank change that has delayed two Statistician. She noted working on addressing this prior to the change but noted an error causing this issue. She noted things not being financially "comfortable" but noted that she believes this will be resolved. She noted recently being triggered by her roommate's loud music that often includes "screams" as lyrics. She noted her roommates avoidance of this feedback and noted this culminating into a disagreement. She noted various other issues in which the roommate has not followed the rules of the home. She noted her roommate often passive aggressive. She noted worry about the roommates reaction. We processed this during the session. We reviewed assertiveness, boundary setting, and conflict resolution. Therapist modeled this  during the session. [name] noted anxiety regarding her pet's upcoming vet appointment and noted anxiety regarding the pet's reaction due to past negative reactions. She noted worry about the use of anesthesia but noted her intent to use prayer and positive thinking. Therapist praised [name] for her efforts to identify positive coping skill. We worked on processing this stressor during the session. Therapist validated [name]'s feelings and experience, encouraged self-care, and provided supportive therapy. A follow-up was scheduled for continued treatment, which she benefits from.    Interventions: CBT & Interpersonal.   Diagnosis:   Major depressive disorder, recurrent episode, in partial remission (HCC)  GAD (generalized anxiety disorder)  Psychiatric Treatment: Yes , Dr. Classie Cruise. Please see chart for medication list. She continues to see his psychiatric provider regularly.   Treatment Plan:  Client Abilities/Strengths [name] is intelligent, self-aware, and motivated for change.    Support System: Father in [place]. Friends in area.   Client Treatment Preferences Outpatient Therapy.   Client Statement of Needs [name] would like to increase frequency of socialization, building local support group, engaging in consistent self-care including exercise, addressing difficulty with engagement in activities including health barriers, improving her overall health, processing past events, processing losses (mother, partner), managing symptoms, balancing responsibilities of day-to-day life.     Treatment Level Weekly  Symptoms  Anxiety: Feeling anxious, difficulty managing worry, worrying about different things, trouble relaxing, restlessness, irritability, and feeling afraid something awful might happen.    (Status: maintained) Depression: Feeling down, trouble sleeping, lethargy, over eating, difficulty with concentration.    (Status: maintained)  Goals:   Enjolie experiences symptoms  of depression, anxiety,  and PTSD.   Treatment plan signed and available on s-drive:  No, pending signature.    Target Date: 12/30/24 Frequency: Weekly  Progress: 0 Modality: individual    Therapist will provide referrals for additional resources as appropriate.  Therapist will provide psycho-education regarding [name]'s diagnosis and corresponding treatment approaches and interventions. Abigail Boyden, [name] will support the patient's ability to achieve the goals identified. will employ CBT, BA, Problem-solving, Solution Focused, Mindfulness,  coping skills, & other evidenced-based practices will be used to promote progress towards healthy functioning to help manage decrease symptoms associated with her diagnosis.   Reduce overall level, frequency, and intensity of the feelings of depression and anxiety evidenced by decreased overall symptoms from 6 to 7 days/week to 0 to 1 days/week per client report for at least 3 consecutive months. Verbally express understanding of the relationship between feelings of depression, anxiety and their impact on thinking patterns and behaviors. Verbalize an understanding of the role that distorted thinking plays in creating fears, excessive worry, and ruminations.  Abraham Hoffmann participated in the creation of the treatment plan)    Abigail Boyden, [name]

## 2024-01-16 ENCOUNTER — Encounter: Payer: Self-pay | Admitting: Family

## 2024-01-23 ENCOUNTER — Encounter (HOSPITAL_COMMUNITY): Payer: Self-pay | Admitting: Psychiatry

## 2024-01-23 ENCOUNTER — Ambulatory Visit (INDEPENDENT_AMBULATORY_CARE_PROVIDER_SITE_OTHER): Admitting: Psychiatry

## 2024-01-23 VITALS — BP 118/67 | HR 50 | Ht 60.0 in | Wt 177.0 lb

## 2024-01-23 DIAGNOSIS — F3341 Major depressive disorder, recurrent, in partial remission: Secondary | ICD-10-CM

## 2024-01-23 DIAGNOSIS — F411 Generalized anxiety disorder: Secondary | ICD-10-CM

## 2024-01-23 DIAGNOSIS — F4310 Post-traumatic stress disorder, unspecified: Secondary | ICD-10-CM | POA: Diagnosis not present

## 2024-01-23 DIAGNOSIS — F5102 Adjustment insomnia: Secondary | ICD-10-CM

## 2024-01-23 MED ORDER — SERTRALINE HCL 100 MG PO TABS: ORAL_TABLET | ORAL | 1 refills | Status: DC

## 2024-01-23 MED ORDER — CLONAZEPAM 0.5 MG PO TABS: 0.2500 mg | ORAL_TABLET | Freq: Every day | ORAL | 1 refills | Status: DC

## 2024-01-23 NOTE — Progress Notes (Signed)
 Patient ID: [name], female   DOB: [birth year], 58 y.o.   MRN: [id number]   [facility] Health Follow-up Outpatient Visit  [name] [birth year] [id number] 58 y.o.  01/23/2024 10:34 AM     HPI Comments: [name] is  a 58  y/o female with a past psychiatric history significant for Post traumatic stress Disorder, Major Depressive Disorder, recurrent, severe and GAD.  On eval doing fair, denies depression on day to day Tolerating zoloft  and not on wellbutrin   Has roomates whom pay her rent Some bad memories from the past but is in therapy and is helping On low dose klonopine for anxiety Understands the risk of benzo and not combine with any substance of abuse Her Dad is still in [place]  Sleeping fair and is on over the counter meds, understands the risk  Learning diabetes control  Modifying factor: book reading Aggravating factor : family concerns in the past Severity fair   Past Medical Family, Social History:  Past Medical History:  Diagnosis Date   Abnormal Papanicolaou smear of cervix with positive human papilloma virus (HPV) test 02/2017   Anxiety    Asthma    Depression    Diabetes mellitus type II    Food intolerance    Headache    High blood pressure    IBS (irritable bowel syndrome)    Insomnia    Migraines    PCOS (polycystic ovarian syndrome)    PTSD (post-traumatic stress disorder)    Seasonal allergies    Thyroid  disease    Family History  Problem Relation Age of Onset   Bipolar disorder Mother    Hypertension Mother    Diabetes Mother    Alzheimer's disease Father    Hypertension Father    Alzheimer's disease Maternal Grandmother    Heart disease Paternal Grandfather    Social History   Socioeconomic History   Marital status: Single    Spouse name: Not on file   Number of children: 0   Years of education: 18   Highest education level: Master's degree (e.g., MA, MS, MEng, MEd, MSW, MBA)  Occupational History   Occupation:  Long Term Disability    Comment: PTSD  Tobacco Use   Smoking status: Never   Smokeless tobacco: Never  Vaping Use   Vaping status: Never Used  Substance and Sexual Activity   Alcohol use: Yes    Alcohol/week: 1.0 standard drink of alcohol    Types: 1 Standard drinks or equivalent per week    Comment: 1 drink a month   Drug use: No   Sexual activity: Not Currently    Partners: Male    Birth control/protection: Abstinence    Comment: 1st intercourse- 16, partners more than 5  Other Topics Concern   Not on file  Social History Narrative   Regular exercise: yes, walk   Caffeine use: occasionally tea   Fun: Social group events, walking her dogs, movies   Denies abuse and feels safe where she lives.    Social Drivers of Corporate investment banker Strain: Low Risk  (01/08/2022)   Overall Financial Resource Strain (CARDIA)    Difficulty of Paying Living Expenses: Not hard at all  Food Insecurity: No Food Insecurity (01/08/2022)   Hunger Vital Sign    Worried About Running Out of Food in the Last Year: Never true    Ran Out of Food in the Last Year: Never true  Transportation Needs: No Transportation Needs (01/08/2022)   PRAPARE -  Administrator, Civil Service (Medical): No    Lack of Transportation (Non-Medical): No  Physical Activity: Inactive (01/08/2022)   Exercise Vital Sign    Days of Exercise per Week: 0 days    Minutes of Exercise per Session: 0 min  Stress: No Stress Concern Present (01/08/2022)   [employer] of Occupational Health - Occupational Stress Questionnaire    Feeling of Stress : Not at all  Social Connections: Socially Isolated (01/08/2022)   Social Connection and Isolation Panel [NHANES]    Frequency of Communication with Friends and Family: Twice a week    Frequency of Social Gatherings with Friends and Family: More than three times a week    Attends Religious Services: Never    Database administrator or Organizations: No    Attends Tax inspector Meetings: Never    Marital Status: Never married  Intimate Partner Violence: Not At Risk (01/08/2022)   Humiliation, Afraid, Rape, and Kick questionnaire    Fear of Current or Ex-Partner: No    Emotionally Abused: No    Physically Abused: No    Sexually Abused: No    Outpatient Encounter Medications as of 01/23/2024  Medication Sig   albuterol  (VENTOLIN  HFA) 108 (90 Base) MCG/ACT inhaler INHALE 2 PUFFS BY MOUTH EVERY 4 TO 6 HOURS AS DIRECTED(RESCUE)   Alpha-Lipoic Acid 600 MG CAPS Take 300 mg by mouth 2 (two) times daily.    atenolol  (TENORMIN ) 100 MG tablet TAKE 1 TABLET(100 MG) BY MOUTH TWICE DAILY   Blood Glucose Monitoring Suppl (ONE TOUCH ULTRA MINI) w/Device KIT Use to check blood sugar 2 times a day.   Blood Glucose Monitoring Suppl (ONETOUCH VERIO) w/Device KIT Use as instructed to check blood sugar 4X daily   clobetasol  cream (TEMOVATE ) 0.05 % Apply 1 application topically 2 (two) times daily.   Continuous Glucose Receiver (FREESTYLE LIBRE 2 READER) DEVI 1 each by Does not apply route daily.   Continuous Glucose Sensor (FREESTYLE LIBRE 14 DAY SENSOR) MISC Use for continuous glucose monitoring.   Digestive Enzymes (ENZYME DIGEST PO) Take by mouth.   estradiol  (ESTRACE  VAGINAL) 0.1 MG/GM vaginal cream Place 1 Applicatorful vaginally at bedtime. Use 3 times a week   fexofenadine  (ALLEGRA ) 180 MG tablet Take 120 mg by mouth in the morning and at bedtime.   fluvastatin  XL (LESCOL  XL) 80 MG 24 hr tablet TAKE 1 TABLET(80 MG) BY MOUTH DAILY   glucose blood (ONETOUCH ULTRA) test strip Use to check blood sugar once a day.   glucose blood (ONETOUCH VERIO) test strip Use as instructed to check blood sugar 4X daily   Insulin  Pen Needle (BD PEN NEEDLE NANO 2ND GEN) 32G X 4 MM MISC USE 4 TIMES DAILY   irbesartan  (AVAPRO ) 75 MG tablet TAKE 1 TABLET(75 MG) BY MOUTH DAILY   LANTUS  SOLOSTAR 100 UNIT/ML Solostar Pen Inject 26 Units into the skin daily.   levothyroxine  (SYNTHROID ) 100 MCG  tablet TAKE 1 TABLET(100 MCG) BY MOUTH DAILY   methocarbamol  (ROBAXIN ) 500 MG tablet TAKE 1 TABLET(500 MG) BY MOUTH EVERY 8 HOURS AS NEEDED FOR MUSCLE SPASMS   NOVOLOG  FLEXPEN 100 UNIT/ML FlexPen Inject 10-30 Units into the skin 3 (three) times daily with meals.   Olopatadine HCl (PATADAY OP) Apply to eye.   OVER THE COUNTER MEDICATION 1 capsule daily. Probiotic renew life    [DISCONTINUED] clonazePAM  (KLONOPIN ) 0.5 MG tablet Take 0.5 tablets (0.25 mg total) by mouth daily.   [DISCONTINUED] sertraline  (ZOLOFT ) 100 MG  tablet TAKE 2 TABLETS BY MOUTH EVERY DAY   clonazePAM  (KLONOPIN ) 0.5 MG tablet Take 0.5 tablets (0.25 mg total) by mouth daily.   sertraline  (ZOLOFT ) 100 MG tablet TAKE 2 TABLETS BY MOUTH EVERY DAY   [DISCONTINUED] EFFEXOR XR 150 MG 24 hr capsule 375 mg daily.    No facility-administered encounter medications on file as of 01/23/2024.     Physical Exam: Constitutional:  BP 118/67   Pulse (!) 50   Ht 5' (1.524 m)   Wt 177 lb (80.3 kg)   BMI 34.57 kg/m   Review of Systems  Cardiovascular:  Negative for chest pain.  Psychiatric/Behavioral:  Negative for depression, substance abuse and suicidal ideas.        Psychiatric Specialty exam: General Appearance:   Eye Contact::    Speech:  Clear and Coherent and Normal Rate  Volume:  Normal  Mood: fair  Affect:    Thought Process:  Coherent, Linear and Logical  Orientation:  Full (Time, Place, and Person)  Thought Content:  WDL  Suicidal Thoughts:  No  Homicidal Thoughts:  No  Memory:  Immediate;   Good Recent;   Good Remote;   Good  Judgement:  Fair  Insight:  Fair  Psychomotor Activity:  Normal  Concentration:  Good  Recall:  Good  Akathisia:  Negative  Language-Intact  Fund of knowledge-Average  Handed:  Right  AIMS (if indicated):     Assets:  Desire for Improvement Housing  Sleep:  Number of Hours: 1-9 hours     Assessment: Axis I: Post traumatic stress Disorder-Major Depressive Disorder,  recurrent, severe- GAd            Prior documentation reviewed   1. Depression: manageable continue zoloft , no headaches or side effects   2. PTSD: manageable is in therapy and will continue zoloft   3. GAD:  fluctuates , continue zoloft  and is working on Pharmacologist, book reading  4. Insomnia: reviewed sleep hygiene, says does not snore, is on the over the counter meds, understands the risk of these meds        Direct care time 18 minutes including face to face, documentation  and chart review  FU 6 m or earlier if needed  Nanda Baar, M.D.  01/23/2024 10:34 AM

## 2024-01-28 ENCOUNTER — Ambulatory Visit (INDEPENDENT_AMBULATORY_CARE_PROVIDER_SITE_OTHER): Admitting: Psychology

## 2024-01-28 DIAGNOSIS — F411 Generalized anxiety disorder: Secondary | ICD-10-CM | POA: Diagnosis not present

## 2024-01-28 DIAGNOSIS — F4310 Post-traumatic stress disorder, unspecified: Secondary | ICD-10-CM

## 2024-01-28 DIAGNOSIS — F3341 Major depressive disorder, recurrent, in partial remission: Secondary | ICD-10-CM

## 2024-01-28 NOTE — Progress Notes (Signed)
 [facility] Counselor/Therapist Progress Note  Patient ID: LIZZETE HOSSFELD, MRN: [id number]    Date: 01/28/24  Time Spent: 1:02  pm - 1:51 pm : 49 Minutes  Treatment Type: Individual Therapy.  Reported Symptoms: Depression, anxiety, and PTSD.   Mental Status Exam: Appearance:  Neat and Well Groomed     Behavior: Appropriate  Motor: Normal  Speech/Language:  Clear and Coherent  Affect: Appropriate  Mood: dysthymic  Thought process: normal  Thought content:   WNL  Sensory/Perceptual disturbances:   WNL  Orientation: oriented to person, place, time/date, and situation  Attention: Good  Concentration: Good  Memory: WNL  Fund of knowledge:  Good  Insight:   Good  Judgment:  Good  Impulse Control: Good   Risk Assessment: Danger to Self:  No Self-injurious Behavior: No Danger to Others: No Duty to Warn:no Physical Aggression / Violence:No  Access to Firearms a concern: No  Gang Involvement:No   Subjective:   ZOYA ZOGG participated in the session, in person in the office with the therapist, and consented to treatment Amyrah reviewed the events of the past week. She noted continued interpersonal stress with roommate including high volume of music and not attempting to not pay rent on time. We worked on processing this and Olwen noted "I really don't like disharmony". We worked on identifying ways to manage these stressors and explored ways to problem-solving. She note interest in exercise but noted waiting to for the weather to get warmer to begin walking in the neighborhood. Additional activities would include visiting a dear friend. She noted her interest in gardening as well but noted not being skilled. She noted generally noted feeling uncomfortable unless she is in the house. She noted general comfort in her yard but not outside of her property. She noted often feeling vulnerable and nervous. She endorsed increased heart rate, feeling hotter. She noted experiencing  hypervigilance & feeling on edge. Therapist encouraged Seryna to document her symptoms and to bring these to a follow-up session. She noted that familiarity increases comfort. She noted having a higher level of comfort, in the past, leaving the home and being in the community. She noted experiencing PTSD symptoms including flashbacks and panic attacks in early adult hood and noted this contributing to wanting to stay home. We will continue to explore this during the session. Therapist encouraged continued effort documenting her symptoms between sessions. Therapist validated Terena's feelings and experiences and provided supportive therapy. A follow-up was scheduled for continued treatment, which she benefits from.    Interventions: CBT & Interpersonal.   Diagnosis:   Major depressive disorder, recurrent episode, in partial remission (HCC)  GAD (generalized anxiety disorder)  Posttraumatic stress disorder  Psychiatric Treatment: Yes , Dr. Classie Cruise. Please see chart for medication list. She continues to see his psychiatric provider regularly.   Treatment Plan:  Client Abilities/Strengths Caylea is intelligent, self-aware, and motivated for change.    Support System: Father in [place]. Friends in area.   Client Treatment Preferences Outpatient Therapy.   Client Statement of Needs Raymona would like to increase frequency of socialization, building local support group, engaging in consistent self-care including exercise, addressing difficulty with engagement in activities including health barriers, improving her overall health, processing past events, processing losses (mother, partner), managing symptoms, balancing responsibilities of day-to-day life.     Treatment Level Weekly  Symptoms  Anxiety: Feeling anxious, difficulty managing worry, worrying about different things, trouble relaxing, restlessness, irritability, and feeling afraid something awful might happen.    (  Status: maintained) Depression:  Feeling down, trouble sleeping, lethargy, over eating, difficulty with concentration.    (Status: maintained)  Goals:   Tinesha experiences symptoms of depression, anxiety, and PTSD.   Treatment plan signed and available on s-drive:  No, pending signature.    Target Date: 12/30/24 Frequency: Weekly  Progress: 0 Modality: individual    Therapist will provide referrals for additional resources as appropriate.  Therapist will provide psycho-education regarding Zada's diagnosis and corresponding treatment approaches and interventions. Belva Boyden, [name] will support the patient's ability to achieve the goals identified. will employ CBT, BA, Problem-solving, Solution Focused, Mindfulness,  coping skills, & other evidenced-based practices will be used to promote progress towards healthy functioning to help manage decrease symptoms associated with her diagnosis.   Reduce overall level, frequency, and intensity of the feelings of depression and anxiety evidenced by decreased overall symptoms from 6 to 7 days/week to 0 to 1 days/week per client report for at least 3 consecutive months. Verbally express understanding of the relationship between feelings of depression, anxiety and their impact on thinking patterns and behaviors. Verbalize an understanding of the role that distorted thinking plays in creating fears, excessive worry, and ruminations.  Abraham Hoffmann participated in the creation of the treatment plan)   Belva Boyden, [name]

## 2024-02-11 ENCOUNTER — Ambulatory Visit (INDEPENDENT_AMBULATORY_CARE_PROVIDER_SITE_OTHER): Admitting: Psychology

## 2024-02-11 DIAGNOSIS — F411 Generalized anxiety disorder: Secondary | ICD-10-CM | POA: Diagnosis not present

## 2024-02-11 DIAGNOSIS — F3341 Major depressive disorder, recurrent, in partial remission: Secondary | ICD-10-CM

## 2024-02-11 DIAGNOSIS — F4310 Post-traumatic stress disorder, unspecified: Secondary | ICD-10-CM

## 2024-02-11 NOTE — Progress Notes (Signed)
 [facility] Counselor/Therapist Progress Note  Patient ID: [name], MRN: [id number]    Date: 02/11/24  Time Spent: 1:02  pm - 1:59 pm : 57 Minutes  Treatment Type: Individual Therapy.  Reported Symptoms: Depression, anxiety, and PTSD.   Mental Status Exam: Appearance:  Neat and Well Groomed     Behavior: Appropriate  Motor: Normal  Speech/Language:  Clear and Coherent  Affect: Appropriate  Mood: anxious  Thought process: normal  Thought content:   WNL  Sensory/Perceptual disturbances:   WNL  Orientation: oriented to person, place, time/date, and situation  Attention: Good  Concentration: Good  Memory: WNL  Fund of knowledge:  Good  Insight:   Good  Judgment:  Good  Impulse Control: Good   Risk Assessment: Danger to Self:  No Self-injurious Behavior: No Danger to Others: No Duty to Warn:no Physical Aggression / Violence:No  Access to Firearms a concern: No  Gang Involvement:No   Subjective:   [name] participated in the session, in person in the office with the therapist, and consented to treatment [name] reviewed the events of the past week. [name] noted that her roommate is still "frustrating" me. We worked on processing this during the session. She described her roommate is bristly and entitled. She noted her continued efforts to resolve the stressors to no avail. We worked on navigating how to communicate a need for change and discussed the importance of assertiveness. She noted anxiety regarding the uncertainty of how people react when you ask them to leave. We worked on identifying her tools to manage her distress. She noted "the rest of my life has flat lined" specifically referring to a lack of opportunity for socialization. She noted having "an unreasonable fear of leaving my house" and noted feelings of discomfort around groups of people. She noted that she "can't relax" and noted fear that she won't be "liked". She noted experiencing  significant bullying at her "private [employer] school". She noted being bullied, as an adult, by white young female coworkers in the past. We worked on processing her experience in the past. We worked on identifying the likelihood of experiencing this again, in adulthood, at a senior center which she stated was quite low. We worked on challenging her worry that this is likely to occur and how one would handle this should it come up including the use of communication skills, assertiveness, etc. [name] noted how unlikely it would be to experience this and that she is now an adult  who is more well prepared to address this. Therapist validated [name] feelings and experience during the session, praised [name] for her effort during the session, and provided supportive therapy. A follow-up was scheduled for continued treatment which she benefits from.   Interventions: CBT & Interpersonal.   Diagnosis:   Major depressive disorder, recurrent episode, in partial remission (HCC)  GAD (generalized anxiety disorder)  Posttraumatic stress disorder  Psychiatric Treatment: Yes , [name]. Please see chart for medication list. She continues to see his psychiatric provider regularly.   Treatment Plan:  Client Abilities/Strengths [name] is intelligent, self-aware, and motivated for change.    Support System: Father in [place]. Friends in area.   Client Treatment Preferences Outpatient Therapy.   Client Statement of Needs [name] would like to increase frequency of socialization, building local support group, engaging in consistent self-care including exercise, addressing difficulty with engagement in activities including health barriers, improving her overall health, processing past events, processing losses (mother, partner), managing symptoms, balancing  responsibilities of day-to-day life.     Treatment Level Weekly  Symptoms  Anxiety: Feeling anxious, difficulty managing worry, worrying about different things,  trouble relaxing, restlessness, irritability, and feeling afraid something awful might happen.    (Status: maintained) Depression: Feeling down, trouble sleeping, lethargy, over eating, difficulty with concentration.    (Status: maintained)  Goals:   [name] experiences symptoms of depression, anxiety, and PTSD.   Treatment plan signed and available on s-drive:  No, pending signature.    Target Date: 12/30/24 Frequency: Weekly  Progress: 0 Modality: individual    Therapist will provide referrals for additional resources as appropriate.  Therapist will provide psycho-education regarding [name]'s diagnosis and corresponding treatment approaches and interventions. Abigail Boyden, [name] will support the patient's ability to achieve the goals identified. will employ CBT, BA, Problem-solving, Solution Focused, Mindfulness,  coping skills, & other evidenced-based practices will be used to promote progress towards healthy functioning to help manage decrease symptoms associated with her diagnosis.   Reduce overall level, frequency, and intensity of the feelings of depression and anxiety evidenced by decreased overall symptoms from 6 to 7 days/week to 0 to 1 days/week per client report for at least 3 consecutive months. Verbally express understanding of the relationship between feelings of depression, anxiety and their impact on thinking patterns and behaviors. Verbalize an understanding of the role that distorted thinking plays in creating fears, excessive worry, and ruminations.  [name] participated in the creation of the treatment plan)   Abigail Boyden, [name]

## 2024-02-17 MED ORDER — LANTUS SOLOSTAR 100 UNIT/ML ~~LOC~~ SOPN: 26.0000 [IU] | PEN_INJECTOR | Freq: Every day | SUBCUTANEOUS | 1 refills | Status: DC

## 2024-02-17 NOTE — Addendum Note (Signed)
 Addended by: Vernon Goodpasture on: 02/17/2024 11:15 AM   Modules accepted: Orders

## 2024-02-19 ENCOUNTER — Encounter: Payer: Self-pay | Admitting: Cardiology

## 2024-02-19 NOTE — Progress Notes (Unsigned)
 Cardiology Office Note   Date:  02/20/2024   ID:  [name], DOB 06-Nov-1965, MRN [id number]  PCP:  Adra Alanis, FNP  Cardiologist:   None Referring:  Adra Alanis, FNP  No chief complaint on file.     History of Present Illness: [name] is a 58 y.o. female who presents for evaluation of significant cardiovascular risk factors.  She has not had any prior cardiac history.  She has had diabetes for a long time.  She does have family history of some heart disease.  She does have some complaints to include shortness of breath and fatigue.  She might get short of breath doing moderate activity such as walking up an incline.  Doing her usual activities such as household laundry and grocery shopping she is not bringing on this shortness of breath.  She is not describing chest pressure, neck or arm discomfort.  She is not describing palpitations, presyncope or syncope.  She unfortunately does not sleep very well and has stress.  She has chronic fatigue.     Past Medical History:  Diagnosis Date   Abnormal Papanicolaou smear of cervix with positive human papilloma virus (HPV) test 02/2017   Anxiety    Asthma    Depression    Diabetes mellitus type II    High blood pressure    IBS (irritable bowel syndrome)    Insomnia    Migraines    PCOS (polycystic ovarian syndrome)    PTSD (post-traumatic stress disorder)    Seasonal allergies    Thyroid  disease     Past Surgical History:  Procedure Laterality Date   CHOLECYSTECTOMY     GALLBLADDER SURGERY       Current Outpatient Medications  Medication Sig Dispense Refill   albuterol  (VENTOLIN  HFA) 108 (90 Base) MCG/ACT inhaler INHALE 2 PUFFS BY MOUTH EVERY 4 TO 6 HOURS AS DIRECTED(RESCUE) 18 g 0   Alpha-Lipoic Acid 600 MG CAPS Take 300 mg by mouth 2 (two) times daily.      atenolol  (TENORMIN ) 100 MG tablet TAKE 1 TABLET(100 MG) BY MOUTH TWICE DAILY 180 tablet 3   Blood Glucose Monitoring Suppl (ONE TOUCH  ULTRA MINI) w/Device KIT Use to check blood sugar 2 times a day. 1 kit 0   Blood Glucose Monitoring Suppl (ONETOUCH VERIO) w/Device KIT Use as instructed to check blood sugar 4X daily 1 kit 0   clobetasol  cream (TEMOVATE ) 0.05 % Apply 1 application topically 2 (two) times daily. 30 g 1   clonazePAM  (KLONOPIN ) 0.5 MG tablet Take 0.5 tablets (0.25 mg total) by mouth daily. 15 tablet 1   Continuous Glucose Receiver (FREESTYLE LIBRE 2 READER) DEVI 1 each by Does not apply route daily. 1 each 0   Continuous Glucose Sensor (FREESTYLE LIBRE 14 DAY SENSOR) MISC Use for continuous glucose monitoring. 6 each 3   Digestive Enzymes (ENZYME DIGEST PO) Take by mouth.     estradiol  (ESTRACE  VAGINAL) 0.1 MG/GM vaginal cream Place 1 Applicatorful vaginally at bedtime. Use 3 times a week 42.5 g 12   fexofenadine  (ALLEGRA ) 180 MG tablet Take 120 mg by mouth in the morning and at bedtime.     fluvastatin  XL (LESCOL  XL) 80 MG 24 hr tablet TAKE 1 TABLET(80 MG) BY MOUTH DAILY 90 tablet 3   glucose blood (ONETOUCH ULTRA) test strip Use to check blood sugar once a day. 100 strip 11   glucose blood (ONETOUCH VERIO) test strip Use as instructed to check  blood sugar 4X daily 300 each 2   Insulin  Pen Needle (BD PEN NEEDLE NANO 2ND GEN) 32G X 4 MM MISC USE 4 TIMES DAILY 400 each 3   irbesartan  (AVAPRO ) 75 MG tablet TAKE 1 TABLET(75 MG) BY MOUTH DAILY 90 tablet 3   LANTUS  SOLOSTAR 100 UNIT/ML Solostar Pen Inject 26 Units into the skin daily. 45 mL 1   levothyroxine  (SYNTHROID ) 100 MCG tablet TAKE 1 TABLET(100 MCG) BY MOUTH DAILY 90 tablet 3   methocarbamol  (ROBAXIN ) 500 MG tablet TAKE 1 TABLET(500 MG) BY MOUTH EVERY 8 HOURS AS NEEDED FOR MUSCLE SPASMS 60 tablet 1   NOVOLOG  FLEXPEN 100 UNIT/ML FlexPen Inject 10-30 Units into the skin 3 (three) times daily with meals. 90 mL 2   Olopatadine HCl (PATADAY OP) Apply to eye.     OVER THE COUNTER MEDICATION 1 capsule daily. Probiotic renew life      sertraline  (ZOLOFT ) 100 MG tablet  TAKE 2 TABLETS BY MOUTH EVERY DAY 60 tablet 1   No current facility-administered medications for this visit.    Allergies:   Latex, Fluticasone , Metformin  and related, and Pravastatin     Social History:  The patient  reports that she has never smoked. She has never used smokeless tobacco. She reports current alcohol use of about 1.0 standard drink of alcohol per week. She reports that she does not use drugs.   Family History:  The patient's family history includes Alzheimer's disease in her father and maternal grandmother; Bipolar disorder in her mother; Diabetes in her mother; Heart disease in her mother and paternal grandfather; Hypertension in her father and mother.    ROS:  Please see the history of present illness.   Otherwise, review of systems are positive for none.   All other systems are reviewed and negative.    PHYSICAL EXAM: VS:  BP 137/77 (BP Location: Right Arm, Patient Position: Sitting, Cuff Size: Normal)   Pulse (!) 50   Ht 5' (1.524 m)   Wt 173 lb (78.5 kg)   SpO2 98%   BMI 33.79 kg/m  , BMI Body mass index is 33.79 kg/m. GENERAL:  Well appearing HEENT:  Pupils equal round and reactive, fundi not visualized, oral mucosa unremarkable NECK:  No jugular venous distention, waveform within normal limits, carotid upstroke brisk and symmetric, no bruits, no thyromegaly LYMPHATICS:  No cervical, inguinal adenopathy LUNGS:  Clear to auscultation bilaterally BACK:  No CVA tenderness CHEST:  Unremarkable HEART:  PMI not displaced or sustained,S1 and S2 within normal limits, no S3, no S4, no clicks, no rubs, 2 out of 6 very brief right upper sternal border systolic murmur nonradiating, no diastolic murmurs ABD:  Flat, positive bowel sounds normal in frequency in pitch, no bruits, no rebound, no guarding, no midline pulsatile mass, no hepatomegaly, no splenomegaly EXT:  2 plus pulses throughout, no edema, no cyanosis no clubbing SKIN:  No rashes no nodules NEURO:  Cranial  nerves II through XII grossly intact, motor grossly intact throughout [facility]:  Cognitively intact, oriented to person place and time    EKG:  EKG Interpretation Date/Time:  Thursday February 20 2024 11:28:11 EDT Ventricular Rate:  52 PR Interval:  182 QRS Duration:  86 QT Interval:  482 QTC Calculation: 448 R Axis:   -26  Text Interpretation: Sinus bradycardia with sinus arrhythmia When compared with ECG of 03-Dec-2010 14:43, rate is slower Confirmed by Eilleen Grates ([id number]) on 02/20/2024 11:37:49 AM     Recent Labs: 07/26/2023: ALT 20; BUN 24;  Creatinine, Ser 1.01; Potassium 4.3; Sodium 137; TSH 6.85 08/20/2023: Hemoglobin 11.4; Platelets 287.0    Lipid Panel    Component Value Date/Time   CHOL 260 (H) 07/26/2023 1039   TRIG 92.0 07/26/2023 1039   HDL 82.10 07/26/2023 1039   CHOLHDL 3 07/26/2023 1039   VLDL 18.4 07/26/2023 1039   LDLCALC 160 (H) 07/26/2023 1039   LDLDIRECT 205.0 01/24/2016 1453      Wt Readings from Last 3 Encounters:  02/20/24 173 lb (78.5 kg)  12/03/23 173 lb 9.6 oz (78.7 kg)  09/24/23 173 lb 3.2 oz (78.6 kg)      Other studies Reviewed: Additional studies/ records that were reviewed today include: Labs. Review of the above records demonstrates:  Please see elsewhere in the note.     ASSESSMENT AND PLAN:   DM: The patient's blood sugar is being managed by primary care and endocrinology.  The last A1c that I see is 6.7.  HTN: Her blood pressure is well-controlled.  No change in therapy.  Dyslipidemia: Her LDL is 160.  I would agree that this is too high given her diabetes and I think she needs medical therapy.  She has been intolerant to statins.  I am going to send her to our Pharm.D. clinic to consider Repatha.  Risk reduction: For further goals of therapy and risk ratification can send her for coronary calcium  score.  Further testing will be based on this result.  Shortness of breath: She can start on an exercise regimen and if her breathing  gets worse rather than better we can consider further evaluation.  I am not strongly suspecting an acute coronary syndrome.  This has been a chronic problem.  Testing will also be guided by the results of her coronary calcium  score.  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  Current medicines are reviewed at length with the patient today.  The patient does not have concerns regarding medicines.  The following changes have been made:  no change  Labs/ tests ordered today include:   Orders Placed This Encounter  Procedures   CT CARDIAC SCORING (SELF PAY ONLY)   AMB Referral to [facility] Pharm-D   EKG 12-Lead     Disposition:   FU with me as needed based on the results of the above.     Signed, Eilleen Grates, MD  02/20/2024 12:42 PM    [facility]

## 2024-02-20 ENCOUNTER — Encounter: Payer: Self-pay | Admitting: Cardiology

## 2024-02-20 ENCOUNTER — Ambulatory Visit: Attending: Cardiology | Admitting: Cardiology

## 2024-02-20 VITALS — BP 137/77 | HR 50 | Ht 60.0 in | Wt 173.0 lb

## 2024-02-20 DIAGNOSIS — I10 Essential (primary) hypertension: Secondary | ICD-10-CM

## 2024-02-20 DIAGNOSIS — E1165 Type 2 diabetes mellitus with hyperglycemia: Secondary | ICD-10-CM

## 2024-02-20 DIAGNOSIS — E785 Hyperlipidemia, unspecified: Secondary | ICD-10-CM | POA: Diagnosis not present

## 2024-02-20 NOTE — Patient Instructions (Signed)
 Medication Instructions:  Your physician recommends that you continue on your current medications as directed. Please refer to the Current Medication list given to you today.  *If you need a refill on your cardiac medications before your next appointment, please call your pharmacy*  Lab Work: NONE If you have labs (blood work) drawn today and your tests are completely normal, you will receive your results only by: MyChart Message (if you have MyChart) OR A paper copy in the mail If you have any lab test that is abnormal or we need to change your treatment, we will call you to review the results.  Testing/Procedures: Coronary Calcium  Score Your physician has requested that you have a coronary calcium  score performed. This is not covered by insurance and will be an out-of-pocket cost of approximately $99.   Follow-Up: At [facility], you and your health needs are our priority.  As part of our continuing mission to provide you with exceptional heart care, our providers are all part of one team.  This team includes your primary Cardiologist (physician) and Advanced Practice Providers or APPs (Physician Assistants and Nurse Practitioners) who all work together to provide you with the care you need, when you need it.  Your next appointment:   As needed  Provider:   Lavonne Prairie, MD  We recommend signing up for the patient portal called "MyChart".  Sign up information is provided on this After Visit Summary.  MyChart is used to connect with patients for Virtual Visits (Telemedicine).  Patients are able to view lab/test results, encounter notes, upcoming appointments, etc.  Non-urgent messages can be sent to your provider as well.   To learn more about what you can do with MyChart, go to [email or web address].   Other Instructions You have been referred to our Lipid Clinic for cholesterol management. Someone will reach out to you to make an appointment.

## 2024-02-25 ENCOUNTER — Ambulatory Visit (INDEPENDENT_AMBULATORY_CARE_PROVIDER_SITE_OTHER): Admitting: Psychology

## 2024-02-25 DIAGNOSIS — F4310 Post-traumatic stress disorder, unspecified: Secondary | ICD-10-CM | POA: Diagnosis not present

## 2024-02-25 DIAGNOSIS — F411 Generalized anxiety disorder: Secondary | ICD-10-CM

## 2024-02-25 DIAGNOSIS — F3341 Major depressive disorder, recurrent, in partial remission: Secondary | ICD-10-CM | POA: Diagnosis not present

## 2024-02-25 NOTE — Progress Notes (Signed)
 [facility] Counselor/Therapist Progress Note  Patient ID: [name], MRN: [id number]    Date: 02/25/24  Time Spent: 12:59  pm - 2:01 pm : 62 Minutes  Treatment Type: Individual Therapy.  Reported Symptoms: Depression, anxiety, and PTSD.   Mental Status Exam: Appearance:  Neat and Well Groomed     Behavior: Appropriate  Motor: Normal  Speech/Language:  Clear and Coherent  Affect: Appropriate  Mood: anxious  Thought process: normal  Thought content:   WNL  Sensory/Perceptual disturbances:   WNL  Orientation: oriented to person, place, time/date, and situation  Attention: Good  Concentration: Good  Memory: WNL  Fund of knowledge:  Good  Insight:   Good  Judgment:  Good  Impulse Control: Good   Risk Assessment: Danger to Self:  No Self-injurious Behavior: No Danger to Others: No Duty to Warn:no Physical Aggression / Violence:No  Access to Firearms a concern: No  Gang Involvement:No   Subjective:   MCKENSEY BERGHUIS participated in the session, in person in the office with the therapist, and consented to treatment Damia reviewed the events of the past week. She noted feeling proud of herself for setting boundaries with her roommate and asking him to leave the home and providing a timeline to leave the home. She noted her efforts to be very clear, concise, and assertive. Therapist praised Jearldine for her effort in this area. She noted her roommate returning home, after leaving within the notice area, and noted his reaction being quite poor and this necessitating contacting the police. She noted working on conflict resolution, during that time, and noted her efforts to be assertive. She noted this being triggering of her PTSD symptoms. She noted this resulting in an aggravation in her IBS. She noted her efforts, at this time, to be more social and get additional support from her friends. She noted her effort to work on addressing her needs going forward, in regards to  feeling safe at home, proactively. Therapist praised Chelcea for her effort during the session and between sessions. Therapist encouraged Genola to continue engaging in self-care, boundaries, and maintaining environmental  safety. A follow-up was scheduled for continued treatment which she benefits from.   Interventions: CBT & Interpersonal.   Diagnosis:   Major depressive disorder, recurrent episode, in partial remission (HCC)  GAD (generalized anxiety disorder)  Posttraumatic stress disorder  Psychiatric Treatment: Yes , Dr. Classie Cruise. Please see chart for medication list. She continues to see his psychiatric provider regularly.   Treatment Plan:  Client Abilities/Strengths Alliya is intelligent, self-aware, and motivated for change.    Support System: Father in [place]. Friends in area.   Client Treatment Preferences Outpatient Therapy.   Client Statement of Needs Renesme would like to increase frequency of socialization, building local support group, engaging in consistent self-care including exercise, addressing difficulty with engagement in activities including health barriers, improving her overall health, processing past events, processing losses (mother, partner), managing symptoms, balancing responsibilities of day-to-day life.     Treatment Level Weekly  Symptoms  Anxiety: Feeling anxious, difficulty managing worry, worrying about different things, trouble relaxing, restlessness, irritability, and feeling afraid something awful might happen.    (Status: maintained) Depression: Feeling down, trouble sleeping, lethargy, over eating, difficulty with concentration.    (Status: maintained)  Goals:   Jaquita experiences symptoms of depression, anxiety, and PTSD.   Treatment plan signed and available on s-drive:  No, pending signature.    Target Date: 12/30/24 Frequency: Weekly  Progress: 0 Modality: individual  Therapist will provide referrals for additional resources as appropriate.   Therapist will provide psycho-education regarding Aubreanna's diagnosis and corresponding treatment approaches and interventions. Belva Boyden, [name] will support the patient's ability to achieve the goals identified. will employ CBT, BA, Problem-solving, Solution Focused, Mindfulness,  coping skills, & other evidenced-based practices will be used to promote progress towards healthy functioning to help manage decrease symptoms associated with her diagnosis.   Reduce overall level, frequency, and intensity of the feelings of depression and anxiety evidenced by decreased overall symptoms from 6 to 7 days/week to 0 to 1 days/week per client report for at least 3 consecutive months. Verbally express understanding of the relationship between feelings of depression, anxiety and their impact on thinking patterns and behaviors. Verbalize an understanding of the role that distorted thinking plays in creating fears, excessive worry, and ruminations.  Abraham Hoffmann participated in the creation of the treatment plan)   Belva Boyden, [name]

## 2024-02-28 ENCOUNTER — Telehealth: Payer: Self-pay | Admitting: Internal Medicine

## 2024-02-28 DIAGNOSIS — E1165 Type 2 diabetes mellitus with hyperglycemia: Secondary | ICD-10-CM

## 2024-02-28 MED ORDER — FREESTYLE LIBRE 3 PLUS SENSOR MISC: 1.0000 | 1 refills | Status: DC

## 2024-02-28 NOTE — Telephone Encounter (Signed)
 Requested Prescriptions   Signed Prescriptions Disp Refills   Continuous Glucose Sensor (FREESTYLE LIBRE 3 PLUS SENSOR) MISC 6 each 1    Sig: Inject 1 Device into the skin continuous. Change every 15 days    Authorizing Provider: Emilie Harden    Ordering User: Vernon Goodpasture

## 2024-02-28 NOTE — Addendum Note (Signed)
 Addended by: Vernon Goodpasture on: 02/28/2024 03:47 PM   Modules accepted: Orders

## 2024-02-28 NOTE — Telephone Encounter (Signed)
 Patient called and can not find her glucose monitor to no fault of her own.She is asking if an RX can be written for a new one or a sample possibly.It is for the Freestyle 2 or 3,she wasn't entirely sure.The best contact number is [phone]

## 2024-02-28 NOTE — Telephone Encounter (Signed)
 Patient is calling and can not find her glucose meter due to no fault of her own.She is asking if there is a sample or RX of  freestyle libre 2 possibly 3?

## 2024-03-04 ENCOUNTER — Ambulatory Visit (HOSPITAL_COMMUNITY)
Admission: RE | Admit: 2024-03-04 | Discharge: 2024-03-04 | Disposition: A | Discharge status: Home / Self Care | Payer: Self-pay | Source: Ambulatory Visit | Attending: Cardiology | Admitting: Cardiology

## 2024-03-04 ENCOUNTER — Ambulatory Visit: Payer: Self-pay | Admitting: Cardiology

## 2024-03-04 DIAGNOSIS — I2510 Atherosclerotic heart disease of native coronary artery without angina pectoris: Secondary | ICD-10-CM

## 2024-03-04 DIAGNOSIS — E785 Hyperlipidemia, unspecified: Secondary | ICD-10-CM | POA: Insufficient documentation

## 2024-03-04 DIAGNOSIS — R0602 Shortness of breath: Secondary | ICD-10-CM

## 2024-03-05 NOTE — Telephone Encounter (Signed)
 The patient has been notified of the result and verbalized understanding.  All questions (if any) were answered. Isobel Marie, RN 03/05/2024 3:14 PM  PET scan ordered. Pt aware that they will call to schedule. Instructions sent via MyChart per pt's request.

## 2024-03-05 NOTE — Telephone Encounter (Signed)
-----   Message from Eilleen Grates sent at 03/04/2024  5:07 PM EDT ----- Her coronary calcium  score is elevated.  Given her shortness of breath and this I would like for her to have a PET scan.  I think the pretest probability of obstructive coronary disease is at least  moderate.   ----- Message ----- From: Interface, Rad Results In Sent: 03/04/2024   4:18 PM EDT To: Eilleen Grates, MD

## 2024-03-08 ENCOUNTER — Other Ambulatory Visit: Payer: Self-pay | Admitting: Family

## 2024-03-10 ENCOUNTER — Ambulatory Visit (INDEPENDENT_AMBULATORY_CARE_PROVIDER_SITE_OTHER): Admitting: Psychology

## 2024-03-10 DIAGNOSIS — F411 Generalized anxiety disorder: Secondary | ICD-10-CM

## 2024-03-10 DIAGNOSIS — F3341 Major depressive disorder, recurrent, in partial remission: Secondary | ICD-10-CM

## 2024-03-10 DIAGNOSIS — F4310 Post-traumatic stress disorder, unspecified: Secondary | ICD-10-CM

## 2024-03-10 NOTE — Progress Notes (Signed)
 [facility] Counselor/Therapist Progress Note  Patient ID: [name], MRN: [id number]    Date: 03/10/24  Time Spent: 1:00 pm - 1:53 pm : 53 Minutes  Treatment Type: Individual Therapy.  Reported Symptoms: Depression, anxiety, and PTSD.   Mental Status Exam: Appearance:  Neat and Well Groomed     Behavior: Appropriate  Motor: Normal  Speech/Language:  Clear and Coherent  Affect: Appropriate  Mood: anxious  Thought process: normal  Thought content:   WNL  Sensory/Perceptual disturbances:   WNL  Orientation: oriented to person, place, time/date, and situation  Attention: Good  Concentration: Good  Memory: WNL  Fund of knowledge:  Good  Insight:   Good  Judgment:  Good  Impulse Control: Good   Risk Assessment: Danger to Self:  No Self-injurious Behavior: No Danger to Others: No Duty to Warn:no Physical Aggression / Violence:No  Access to Firearms a concern: No  Gang Involvement:No   Subjective:   [name] participated in the session, in person in the office with the therapist, and consented to treatment [name] reviewed the events of the past week.She noted feeling more positive about her roommate stressors due to starting the necessary legal steps. She noted being provided support during this time. She noted feeling accomplished regarding various home improvement projects. Additionally, she noted working on organizational tasks in the home, as well. She noted recently making a new friend and is hopeful to continue spending time together going forward. She noted missing various activities in her life including travel. She noted her interest in engagement going forward. Therapist praised [name] for her effort to address various tasks and socialize. Therapist encouraged continued effort in this area going forward. Therapist praised [name] for her effort to identify areas of gratitude during the session. We worked on identifying ways to engage in travel, which she  noted being enjoyable and meaningful to her, and identified current barriers and shorter term goals to address via planning and problem-solving. [name] was engaged and motivated during the session. She expressed commitment towards goals. Therapist validated [name]'s feelings and experience during the session and provided supportive therapy. A follow-up was scheduled for continued treatment which [name] benefits from.   Interventions: CBT   Diagnosis:   Major depressive disorder, recurrent episode, in partial remission (HCC)  GAD (generalized anxiety disorder)  Posttraumatic stress disorder  Psychiatric Treatment: Yes , Dr. Aktar. Please see chart for medication list. She continues to see his psychiatric provider regularly.   Treatment Plan:  Client Abilities/Strengths [name] is intelligent, self-aware, and motivated for change.    Support System: Father in [place]. Friends in area.   Client Treatment Preferences Outpatient Therapy.   Client Statement of Needs [name] would like to increase frequency of socialization, building local support group, engaging in consistent self-care including exercise, addressing difficulty with engagement in activities including health barriers, improving her overall health, processing past events, processing losses (mother, partner), managing symptoms, balancing responsibilities of day-to-day life.     Treatment Level Weekly  Symptoms  Anxiety: Feeling anxious, difficulty managing worry, worrying about different things, trouble relaxing, restlessness, irritability, and feeling afraid something awful might happen.    (Status: maintained) Depression: Feeling down, trouble sleeping, lethargy, over eating, difficulty with concentration.    (Status: maintained)  Goals:   [name] experiences symptoms of depression, anxiety, and PTSD.   Treatment plan signed and available on s-drive:  No, pending signature.    Target Date: 12/30/24 Frequency: Weekly  Progress: 0  Modality: individual  Therapist will provide referrals for additional resources as appropriate.  Therapist will provide psycho-education regarding [name]'s diagnosis and corresponding treatment approaches and interventions. Abigail Mullet, [name] will support the patient's ability to achieve the goals identified. will employ CBT, BA, Problem-solving, Solution Focused, Mindfulness,  coping skills, & other evidenced-based practices will be used to promote progress towards healthy functioning to help manage decrease symptoms associated with her diagnosis.   Reduce overall level, frequency, and intensity of the feelings of depression and anxiety evidenced by decreased overall symptoms from 6 to 7 days/week to 0 to 1 days/week per client report for at least 3 consecutive months. Verbally express understanding of the relationship between feelings of depression, anxiety and their impact on thinking patterns and behaviors. Verbalize an understanding of the role that distorted thinking plays in creating fears, excessive worry, and ruminations.  [name] participated in the creation of the treatment plan)   Abigail Mullet, [name]

## 2024-03-24 ENCOUNTER — Ambulatory Visit (INDEPENDENT_AMBULATORY_CARE_PROVIDER_SITE_OTHER): Admitting: Psychology

## 2024-03-24 DIAGNOSIS — F4310 Post-traumatic stress disorder, unspecified: Secondary | ICD-10-CM

## 2024-03-24 DIAGNOSIS — F3341 Major depressive disorder, recurrent, in partial remission: Secondary | ICD-10-CM | POA: Diagnosis not present

## 2024-03-24 DIAGNOSIS — F411 Generalized anxiety disorder: Secondary | ICD-10-CM
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
## 2024-04-03 ENCOUNTER — Ambulatory Visit (INDEPENDENT_AMBULATORY_CARE_PROVIDER_SITE_OTHER): Admitting: Internal Medicine

## 2024-04-03 ENCOUNTER — Encounter: Payer: Self-pay | Admitting: Internal Medicine

## 2024-04-03 VITALS — BP 120/72 | HR 47 | Ht 60.0 in | Wt 177.2 lb

## 2024-04-03 DIAGNOSIS — E039 Hypothyroidism, unspecified: Secondary | ICD-10-CM

## 2024-04-03 DIAGNOSIS — E1165 Type 2 diabetes mellitus with hyperglycemia: Secondary | ICD-10-CM

## 2024-04-03 DIAGNOSIS — E782 Mixed hyperlipidemia: Secondary | ICD-10-CM

## 2024-04-03 DIAGNOSIS — Z794 Long term (current) use of insulin: Secondary | ICD-10-CM

## 2024-04-03 LAB — POCT GLYCOSYLATED HEMOGLOBIN (HGB A1C): Hemoglobin A1C: 6.4 % — AB (ref 4.0–5.6)

## 2024-04-03 NOTE — Progress Notes (Addendum)
 Patient ID: [name], female   DOB: [birth year], 58 y.o.   MRN: [id number]  HPI: [name] is a 58 y.o.-year-old female, returning for f/u for DM2, dx 2013, insulin -dependent, uncontrolled, with complications (diabetic retinopathy) and also hypothyroidism. Last visit 4 months ago.  Interim history: No increased urination, blurry vision, chest pain.  She has chronic nausea.  No acid reflux.  She continues to have a relaxed diet, including sweets. She hds PT/OT for wrist and neck pain before last visit- this worked.  DM2: Reviewed HbA1c levels: Lab Results  Component Value Date   HGBA1C 6.7 (A) 12/03/2023   HGBA1C 6.6 (A) 07/26/2023   HGBA1C 6.5 03/19/2023   Pt is on a regimen of: - Lantus  12 >> ... 20 >> 25 >> 20 >> 20 units at bedtime >> 24 >> 20 >> 26 units in am - NovoLog  15-20 units 15 minutes before each meal, 24-26 >> 10-12 units for protein shake; 16-17 units before a regular meal; 22-25 for a high carb meal >>  10-12 units before a smaller meal 16-17 units before a regular meal 22-26 units before a high-carb meal If you take insulin  after the meal, take 2/3 of the insulin  dose depending on the time since the meal. She is not using the following sliding scale: We stopped Cycloset  in 06/2020. She did not start Welchol  2 tabs 2x a day - added 03/2017 - too expensive On Turmeric, alpha-lipoic acid + 660 mcg biotin per day, Garlicin. We stopped Glipizide  when we started NovoLog .  She has various intolerances to different diabetic medicines: Metformin  ER >> in the past, she had gastric discomfort and severe diarrhea (loss of bowel control) Januvia >> tried for 1-2 months >> left chest pain She was wondering if she could take Invokana, however she had multiple vaginal yeast infections. She Also had nocturia 3-4 x a night.  She had reactive hypoglycemia symptoms while on Quercetin (was taking it for allergies)  She stopped Rybelsus  7 mg >> stopped 2/2 N and D   She checks  her sugars more than 4 times a day with her libre 3+ CGM (now with phone):  Previously:  Previously:   Lowest: LO x2 >> .SABRA. 40 >> 50 >> 63 (at night). Highest: 455 >> .SABRA.  350 >> 300s >> 300s.  She has many food intolerances: - Gluten >> cannot eat bread or pastry.  - raw vegetables, beans, cruciferous vegetables, fruit >> diarrhea.  - No dairy but, cereals  -at night -now off ice cream In the past, no she was taking up to 36 units of NovoLog  for ice cream.  I strongly advised her to stop this in the past. She does not digest fat well after her cholecystectomy in 2011.   Meter: One Touch Mini  No CKD, last BUN/creatinine was:  Lab Results  Component Value Date   BUN 24 (H) 07/26/2023   CREATININE 1.01 07/26/2023   No results found for: MICRALBCREAT On irbesartan  75.  + HL: Lab Results  Component Value Date   CHOL 260 (H) 07/26/2023   HDL 82.10 07/26/2023   LDLCALC 160 (H) 07/26/2023   LDLDIRECT 205.0 01/24/2016   TRIG 92.0 07/26/2023   CHOLHDL 3 07/26/2023  Previous cholesterol levels were: 325/197/132/154.  She could not tolerate pravastatin  or Livalo  due to muscle aches.  She refused a referral to lipid clinic in the past for PCSK9 inhibitors.  On fluvastatin  XL + CoQ10. On Fish oil now.  I suggested Zetia  but she refused due to the possibility of developing diarrhea on it. At last visit I suggested Red Yeast Rice try 600 mg twice a day >> did not try it yet. Now seen in the lipid clinic.  Planning to start Repatha.  - last dilated eye exam was on 07/08/2023: + DR. Dr. Buren.   - + Numbness and tingling in her legs.  She is on the R enantiomer of alpha-lipoic acid.  Last foot exam 07/26/2023.  She also has a history of PCOS- she has seen Dr. Lauraine Gaines at [facility] in the past - note from 08/26/2012: perimenopause, and at that time she was on Yasmin, subsequently changed to Necon. She sees Dr. Rosaline Perches with OB/GYN. She has PTSD, MDD, insomnia, anxiety, asthma,  HTN, GERD, status post cholecystectomy, anemia, transaminitis. She also had increased uterine bleeding (on Necon). She sawan acupuncturist. She takes Nettle powder >> helped her with diarrhea, anemia, insomnia. 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 Also, Undecylenic acid and mastic gum, no carbs x 1-2 months.  She has IBS >> better on L- plantarum, S.bulardii - Brevibacillus Laterosporus 20 min before b'fast.   Hypothyroidism  -Diagnosed in 02/2018. We started levothyroxine  03/2018.  Pt is on levothyroxine  100 mcg daily: - in am >> but wakes up late >> previously missing it 50% of the time >> now missing 25% of the doses - fasting - at least 30 min from b'fast - + butyrate (with Ca) later in the day - no iron - no multivitamins - no PPIs - not on Biotin  Reviewed her TFTs: Lab Results  Component Value Date   TSH 6.85 (H) 07/26/2023   TSH 1.78 09/27/2022   TSH 6.06 (H) 06/27/2022   TSH 8.33 (H) 02/22/2022   TSH 2.02 02/06/2021   TSH 2.82 07/01/2020   TSH 6.44 (H) 03/22/2020   TSH 2.96 04/01/2019   TSH 1.26 07/30/2018   TSH 10.64 (H) 04/04/2018   FREET4 0.80 07/26/2023   FREET4 0.88 06/27/2022   FREET4 0.91 02/22/2022   FREET4 0.63 02/06/2021   FREET4 0.78 07/01/2020   FREET4 0.91 03/22/2020   FREET4 0.60 04/01/2019   FREET4 0.84 07/30/2018   FREET4 0.72 04/04/2018   T3FREE 2.6 04/04/2018   Her antithyroid antibodies were not elevated: Component     Latest Ref Rng & Units 04/04/2018  Thyroglobulin Ab     < or = 1 IU/mL <1  Thyroperoxidase Ab SerPl-aCnc     <9 IU/mL 2   No FH of thyroid  cancer. No h/o radiation tx to head or neck. No Biotin use. No recent steroids use.   Pt denies: - feeling nodules in neck - hoarseness - dysphagia - choking  She takes vitamin D 5000 units.  ROS: + See HPI  I reviewed pt's medications, allergies, PMH, social hx, family hx, and changes were documented in the history of present  illness. Otherwise, unchanged from my initial visit note.  Past Medical History:  Diagnosis Date   Abnormal Papanicolaou smear of cervix with positive human papilloma virus (HPV) test 02/2017   Anxiety    Asthma    Depression    Diabetes mellitus type II    High blood pressure    IBS (irritable bowel syndrome)    Insomnia    Migraines    PCOS (polycystic ovarian syndrome)    PTSD (post-traumatic stress disorder)    Seasonal allergies    Thyroid  disease  Past Surgical History:  Procedure Laterality Date   CHOLECYSTECTOMY     GALLBLADDER SURGERY     Social History   Socioeconomic History   Marital status: Single    Spouse name: Not on file   Number of children: 0   Years of education: 9   Highest education level: Master's degree (e.g., MA, MS, MEng, MEd, MSW, MBA)  Occupational History   Occupation: Long Term Disability    Comment: PTSD  Tobacco Use   Smoking status: Never   Smokeless tobacco: Never  Vaping Use   Vaping status: Never Used  Substance and Sexual Activity   Alcohol use: Yes    Alcohol/week: 1.0 standard drink of alcohol    Types: 1 Standard drinks or equivalent per week    Comment: 1 drink a month   Drug use: No   Sexual activity: Not Currently    Partners: Male    Birth control/protection: Abstinence    Comment: 1st intercourse- 16, partners more than 5  Other Topics Concern   Not on file  Social History Narrative   Regular exercise: yes, walk   Caffeine use: occasionally tea   Fun: Social group events, walking her dogs, movies   Denies abuse and feels safe where she lives.    Social Drivers of Corporate investment banker Strain: Low Risk  (01/08/2022)   Overall Financial Resource Strain (CARDIA)    Difficulty of Paying Living Expenses: Not hard at all  Food Insecurity: No Food Insecurity (01/08/2022)   Hunger Vital Sign    Worried About Running Out of Food in the Last Year: Never true    Ran Out of Food in the Last Year: Never true   Transportation Needs: No Transportation Needs (01/08/2022)   PRAPARE - Administrator, Civil Service (Medical): No    Lack of Transportation (Non-Medical): No  Physical Activity: Inactive (01/08/2022)   Exercise Vital Sign    Days of Exercise per Week: 0 days    Minutes of Exercise per Session: 0 min  Stress: No Stress Concern Present (01/08/2022)   [employer] of Occupational Health - Occupational Stress Questionnaire    Feeling of Stress : Not at all  Social Connections: Socially Isolated (01/08/2022)   Social Connection and Isolation Panel    Frequency of Communication with Friends and Family: Twice a week    Frequency of Social Gatherings with Friends and Family: More than three times a week    Attends Religious Services: Never    Database administrator or Organizations: No    Attends Banker Meetings: Never    Marital Status: Never married  Intimate Partner Violence: Not At Risk (01/08/2022)   Humiliation, Afraid, Rape, and Kick questionnaire    Fear of Current or Ex-Partner: No    Emotionally Abused: No    Physically Abused: No    Sexually Abused: No   Current Outpatient Medications on File Prior to Visit  Medication Sig Dispense Refill   albuterol  (VENTOLIN  HFA) 108 (90 Base) MCG/ACT inhaler Inhale 2 puffs into the lungs every 4 (four) hours as needed for wheezing or shortness of breath. 18 g 5   Alpha-Lipoic Acid 600 MG CAPS Take 300 mg by mouth 2 (two) times daily.      atenolol  (TENORMIN ) 100 MG tablet TAKE 1 TABLET(100 MG) BY MOUTH TWICE DAILY 180 tablet 3   Blood Glucose Monitoring Suppl (ONE TOUCH ULTRA MINI) w/Device KIT Use to check blood sugar 2 times  a day. 1 kit 0   Blood Glucose Monitoring Suppl (ONETOUCH VERIO) w/Device KIT Use as instructed to check blood sugar 4X daily 1 kit 0   clobetasol  cream (TEMOVATE ) 0.05 % Apply 1 application topically 2 (two) times daily. 30 g 1   clonazePAM  (KLONOPIN ) 0.5 MG tablet Take 0.5 tablets (0.25 mg  total) by mouth daily. 15 tablet 1   Continuous Glucose Receiver (FREESTYLE LIBRE 2 READER) DEVI 1 each by Does not apply route daily. 1 each 0   Continuous Glucose Sensor (FREESTYLE LIBRE 3 PLUS SENSOR) MISC Inject 1 Device into the skin continuous. Change every 15 days 6 each 1   Digestive Enzymes (ENZYME DIGEST PO) Take by mouth.     estradiol  (ESTRACE  VAGINAL) 0.1 MG/GM vaginal cream Place 1 Applicatorful vaginally at bedtime. Use 3 times a week 42.5 g 12   fexofenadine  (ALLEGRA ) 180 MG tablet Take 120 mg by mouth in the morning and at bedtime.     fluvastatin  XL (LESCOL  XL) 80 MG 24 hr tablet TAKE 1 TABLET(80 MG) BY MOUTH DAILY 90 tablet 3   glucose blood (ONETOUCH ULTRA) test strip Use to check blood sugar once a day. 100 strip 11   glucose blood (ONETOUCH VERIO) test strip Use as instructed to check blood sugar 4X daily 300 each 2   Insulin  Pen Needle (BD PEN NEEDLE NANO 2ND GEN) 32G X 4 MM MISC USE 4 TIMES DAILY 400 each 3   irbesartan  (AVAPRO ) 75 MG tablet TAKE 1 TABLET(75 MG) BY MOUTH DAILY 90 tablet 3   LANTUS  SOLOSTAR 100 UNIT/ML Solostar Pen Inject 26 Units into the skin daily. 45 mL 1   levothyroxine  (SYNTHROID ) 100 MCG tablet TAKE 1 TABLET(100 MCG) BY MOUTH DAILY 90 tablet 3   methocarbamol  (ROBAXIN ) 500 MG tablet TAKE 1 TABLET(500 MG) BY MOUTH EVERY 8 HOURS AS NEEDED FOR MUSCLE SPASMS 60 tablet 1   NOVOLOG  FLEXPEN 100 UNIT/ML FlexPen Inject 10-30 Units into the skin 3 (three) times daily with meals. 90 mL 2   Olopatadine HCl (PATADAY OP) Apply to eye.     OVER THE COUNTER MEDICATION 1 capsule daily. Probiotic renew life      sertraline  (ZOLOFT ) 100 MG tablet TAKE 2 TABLETS BY MOUTH EVERY DAY 60 tablet 1   [DISCONTINUED] EFFEXOR XR 150 MG 24 hr capsule 375 mg daily.  (Patient not taking: Reported on 02/20/2024)     No current facility-administered medications on file prior to visit.   Allergies  Allergen Reactions   Latex Itching and Swelling   Fluticasone      Nose bleeds    Metformin  And Related     GI side effects   Pravastatin      myalgias   Family History  Problem Relation Age of Onset   Bipolar disorder Mother    Hypertension Mother    Diabetes Mother    Heart disease Mother        CAD, pacemaker   Alzheimer's disease Father    Hypertension Father    Alzheimer's disease Maternal Grandmother    Heart disease Paternal Grandfather    PE: BP 120/72   Pulse (!) 47   Ht 5' (1.524 m)   Wt 177 lb 3.2 oz (80.4 kg)   SpO2 97%   BMI 34.61 kg/m    Wt Readings from Last 3 Encounters:  04/03/24 177 lb 3.2 oz (80.4 kg)  02/20/24 173 lb (78.5 kg)  12/03/23 173 lb 9.6 oz (78.7 kg)   Constitutional: overweight, in  NAD Eyes:  EOMI, no exophthalmos ENT: no neck masses, no cervical lymphadenopathy Cardiovascular: RRR, No MRG Respiratory: CTA B Musculoskeletal: no deformities Skin: No rashes Neurological: +tremor with outstretched hands Diabetic Foot Exam - Simple   Simple Foot Form Diabetic Foot exam was performed with the following findings: Yes 04/03/2024 11:45 AM  Visual Inspection No deformities, no ulcerations, no other skin breakdown bilaterally: Yes Sensation Testing Intact to touch and monofilament testing bilaterally: Yes Pulse Check Posterior Tibialis and Dorsalis pulse intact bilaterally: Yes Comments    ASSESSMENT: 1. DM2, insulin -dependent, fairly well controlled, with complications - DR  2. HL - Very high LDL - high HDL - slightly high TG  3.  Hypothyroidism  PLAN:  1. DM2 - Patient with insulin -dependent type 2 diabetes, with fair control, but with higher hyperglycemic values after dietary indiscretions including many sweets.  At last visit, sugars were worse, fluctuating around the upper limit of the target range but with higher blood sugars after each meal.  Sugars improved after midnight, but she had snacks afterwards and sugars were worsening again.  We discussed about the importance of improving diet and reducing sweets  but I also recommended to increase her morning dose of Lantus .  HbA1c at last visit was slightly higher, at 6.7%. CGM interpretation: -At today's visit, we reviewed her CGM downloads: It appears that 64% of values are in target range (goal >70%), while 33% are higher than 180 (goal <25%), and 3% are lower than 70 (goal <4%).  The calculated average blood sugar is 162.  The projected HbA1c for the next 3 months (GMI) is 7.2%. -Reviewing the CGM trends, sugars appear to be fluctuating, increasing their she starts eating midday, but particularly later in the day, in the afternoon and evening.  She is taking NovoLog  many times after the meal, at approximately the same dose that she would have normally taken before the meal.  As a consequence, she has hyperglycemia after meals followed by hypoglycemia episodes.  We again discussed about not taking the full dose of mealtime insulin  if she has to inject it after the meal, and definitely to go to the suggested sliding scale.  She has not used it since last visit.  Will write it out for her again.  Again strongly advised her to try to inject NovoLog  15 minutes before each meal.  Otherwise, I did not suggest to change her regimen. - I advised her to:  Patient Instructions  Please continue: - Lantus  26 units in am - Novolog  15 min before meals 10-12 units before a smaller meal 16-17 units before a regular meal 22-26 units before a high-carb meal  Use: - Sliding scale for NovoLog : - 150- 165: + 1 unit  - 166- 180: + 2 units  - 181- 195: + 3 units  - 196- 210: + 4 units  - 211- 225: + 5 units  - 226- 240: + 6 units  - >240 + 7 units    Please continue: - Levothyroxine  100 mg daily  Take the thyroid  hormone every day, with water, at least 30 minutes before breakfast, separated by at least 4 hours from: - acid reflux medications - calcium  - iron - multivitamins   Please return in 4 months.  - we checked her HbA1c: 6.4% (lower) - advised to check  sugars at different times of the day - 4x a day, rotating check times - advised for yearly eye exams >> she is UTD - return to clinic in 4  months  2. HL - Lipid panel was reviewed: Very high LDL: Lab Results  Component Value Date   CHOL 260 (H) 07/26/2023   HDL 82.10 07/26/2023   LDLCALC 160 (H) 07/26/2023   LDLDIRECT 205.0 01/24/2016   TRIG 92.0 07/26/2023   CHOLHDL 3 07/26/2023  -She previous she could not tolerate Livalo . I Advised her to try Zetia but she declined that she read that this could cause diarrhea. -She declined a referral to the lipid clinic for PCSK9 inhibitors -She continues to take fluvastatin  XL inconsistently -She used to eat ice cream and other sweets -She started fish oil 2400 mg daily -I then suggested red yeast rice but she did not start this - She was then seen by Dr. Lavona and referred to [facility] for starting Repatha.  She will also have a coronary calcium  score checked.  3.   Hypothyroidism - latest thyroid  labs reviewed with pt. >> TSH was elevated (see below), but she mentioned that she was missing approximately 50% of the doses as she was staying up at night and going to bed late.  I advised her to take it whenever she woke up. Lab Results  Component Value Date   TSH 6.85 (H) 07/26/2023  - she continues on LT4 100 mcg daily - pt feels good on this dose. - we discussed about taking the thyroid  hormone every day, with water, >30 minutes before breakfast, separated by >4 hours from acid reflux medications, calcium , iron, multivitamins. Pt. is taking it correctly but still misses many doses.  She estimates missing 25% of the doses.  We again discussed about the importance of taking it daily, even if she wakes up late. - will check thyroid  tests today: TSH and fT4 - If labs are abnormal, she will need to return for repeat TFTs in 1.5 months  Orders Placed This Encounter  Procedures   TSH   T4, free   Microalbumin / creatinine urine ratio   POCT  glycosylated hemoglobin (Hb A1C)   Component     Latest Ref Rng 04/03/2024  Hemoglobin A1C     4.0 - 5.6 % 6.4 !   TSH     0.40 - 4.50 mIU/L 1.74   T4,Free(Direct)     0.8 - 1.8 ng/dL 1.4   Normal TFTs.  Lela Fendt, MD PhD [facility]

## 2024-04-03 NOTE — Patient Instructions (Addendum)
 Please continue: - Lantus  26 units in am - Novolog  15 min before meals 10-12 units before a smaller meal 16-17 units before a regular meal 22-26 units before a high-carb meal  Use: - Sliding scale for NovoLog : - 150- 165: + 1 unit  - 166- 180: + 2 units  - 181- 195: + 3 units  - 196- 210: + 4 units  - 211- 225: + 5 units  - 226- 240: + 6 units  - >240 + 7 units    Please continue: - Levothyroxine  100 mg daily  Take the thyroid  hormone every day, with water, at least 30 minutes before breakfast, separated by at least 4 hours from: - acid reflux medications - calcium  - iron - multivitamins   Please return in 4 months.

## 2024-04-04 LAB — TSH: TSH: 1.74 m[IU]/L (ref 0.40–4.50)

## 2024-04-04 LAB — T4, FREE: Free T4: 1.4 ng/dL (ref 0.8–1.8)

## 2024-04-06 ENCOUNTER — Ambulatory Visit: Payer: Self-pay | Admitting: Internal Medicine

## 2024-04-07 ENCOUNTER — Ambulatory Visit: Admitting: Psychology

## 2024-04-09 ENCOUNTER — Ambulatory Visit

## 2024-04-14 ENCOUNTER — Other Ambulatory Visit (HOSPITAL_COMMUNITY): Payer: Self-pay

## 2024-04-14 ENCOUNTER — Ambulatory Visit: Attending: Internal Medicine | Admitting: Pharmacist

## 2024-04-14 ENCOUNTER — Telehealth: Payer: Self-pay | Admitting: Pharmacy Technician

## 2024-04-14 DIAGNOSIS — E782 Mixed hyperlipidemia: Secondary | ICD-10-CM

## 2024-04-14 NOTE — Patient Instructions (Signed)

## 2024-04-14 NOTE — Telephone Encounter (Signed)
 Pharmacy Patient Advocate Encounter   Received notification from CoverMyMeds that prior authorization for Repatha is required/requested.   Insurance verification completed.   The patient is insured through [facility] ADVANTAGE/RX ADVANCE .   Per test claim: PA required; PA submitted to above mentioned insurance via CoverMyMeds Key/confirmation #/EOC [id number] Status is pending

## 2024-04-14 NOTE — Assessment & Plan Note (Signed)
 Assessment: LDL-C is above goal of less than 70 due to diabetes and coronary calcium  score  She has been intolerant to 3 different statins We discussed reason behind treating LDL cholesterol, medication options including trying a different statin or PCSK9 inhibitors.  Reviewed Repatha injection technique, side effects, cost and dosing. Not much physical activity but is working to try to increase this-we talked about both the physical and mental benefits of this No alcohol or tobacco Does have family history of heart disease and peripheral vascular disease  Plan: Submit prior authorization for Repatha Repeat labs at endocrinologist in a little over 3 months Increase physical activity

## 2024-04-14 NOTE — Progress Notes (Signed)
 Patient ID: [name]                 DOB: [birth year]                    MRN: [id number]      HPI: [name] is a 58 y.o. female patient referred to lipid clinic by Dr. Lavona. PMH is significant for HTN, DM, depression, Coronary calcium  score of 122. This was 95 percentile for age-, race-, and sex-matched controls.  History of statin intolerance and was referred to lipid clinic.  Patient presents today to lipid clinic.  Her last LDL-C was 161 no statin medication.  She did report that she was taking Coromega at the time.  This is just a DHA EPA supplement.  But it is sold out everywhere and she cannot find it.  She has a lot of digestive issues and therefore has a lot of food restrictions but overall tries to eat a healthy diet.  She does not get a lot of exercise due to her depression but she is working with her therapist to get her up and moving more.  We discussed reason behind treating LDL cholesterol, medication options including trying a different statin or PCSK9 inhibitors.  Reviewed Repatha injection technique, side effects, cost and dosing.  Current Medications: None Intolerances: pravastatin , pitavastatin  (leg aches), fluvastatin  (felt weird, didn't feel good) Risk Factors: DM, HTN, coronary calcium  score 122 which is 95th percentile LDL-C goal: <70 ApoB goal: <80  Diet:  Has a lot of food intolerances (diarrhea) Avoids gluten and dairy Can't eat broccoli, lettuce Allergic to peanuts Lean meats, fresh fruit, some cooked veggies (squash) Drink: water, kambusha  Exercise: working on getting more movement into her life  Family History: grandfather died of MI at 52 (paternal), mother (33) passed of DM complications, PVD,   Social History: No ETOH, no tobacco  Labs: Lipid Panel     Component Value Date/Time   CHOL 260 (H) 07/26/2023 1039   TRIG 92.0 07/26/2023 1039   HDL 82.10 07/26/2023 1039   CHOLHDL 3 07/26/2023 1039   VLDL 18.4 07/26/2023 1039   LDLCALC  160 (H) 07/26/2023 1039   LDLDIRECT 205.0 01/24/2016 1453    Past Medical History:  Diagnosis Date   Abnormal Papanicolaou smear of cervix with positive human papilloma virus (HPV) test 02/2017   Anxiety    Asthma    Depression    Diabetes mellitus type II    High blood pressure    IBS (irritable bowel syndrome)    Insomnia    Migraines    PCOS (polycystic ovarian syndrome)    PTSD (post-traumatic stress disorder)    Seasonal allergies    Thyroid  disease     Current Outpatient Medications on File Prior to Visit  Medication Sig Dispense Refill   albuterol  (VENTOLIN  HFA) 108 (90 Base) MCG/ACT inhaler Inhale 2 puffs into the lungs every 4 (four) hours as needed for wheezing or shortness of breath. 18 g 5   Alpha-Lipoic Acid 600 MG CAPS Take 300 mg by mouth 2 (two) times daily.      atenolol  (TENORMIN ) 100 MG tablet TAKE 1 TABLET(100 MG) BY MOUTH TWICE DAILY 180 tablet 3   Blood Glucose Monitoring Suppl (ONE TOUCH ULTRA MINI) w/Device KIT Use to check blood sugar 2 times a day. 1 kit 0   Blood Glucose Monitoring Suppl (ONETOUCH VERIO) w/Device KIT Use as instructed to check blood sugar 4X daily 1 kit 0  clobetasol  cream (TEMOVATE ) 0.05 % Apply 1 application topically 2 (two) times daily. 30 g 1   clonazePAM  (KLONOPIN ) 0.5 MG tablet Take 0.5 tablets (0.25 mg total) by mouth daily. 15 tablet 1   Continuous Glucose Receiver (FREESTYLE LIBRE 2 READER) DEVI 1 each by Does not apply route daily. 1 each 0   Continuous Glucose Sensor (FREESTYLE LIBRE 3 PLUS SENSOR) MISC Inject 1 Device into the skin continuous. Change every 15 days 6 each 1   Digestive Enzymes (ENZYME DIGEST PO) Take by mouth.     estradiol  (ESTRACE  VAGINAL) 0.1 MG/GM vaginal cream Place 1 Applicatorful vaginally at bedtime. Use 3 times a week 42.5 g 12   fexofenadine  (ALLEGRA ) 180 MG tablet Take 120 mg by mouth in the morning and at bedtime.     fluvastatin  XL (LESCOL  XL) 80 MG 24 hr tablet TAKE 1 TABLET(80 MG) BY MOUTH  DAILY 90 tablet 3   glucose blood (ONETOUCH ULTRA) test strip Use to check blood sugar once a day. 100 strip 11   glucose blood (ONETOUCH VERIO) test strip Use as instructed to check blood sugar 4X daily 300 each 2   Insulin  Pen Needle (BD PEN NEEDLE NANO 2ND GEN) 32G X 4 MM MISC USE 4 TIMES DAILY 400 each 3   irbesartan  (AVAPRO ) 75 MG tablet TAKE 1 TABLET(75 MG) BY MOUTH DAILY 90 tablet 3   LANTUS  SOLOSTAR 100 UNIT/ML Solostar Pen Inject 26 Units into the skin daily. 45 mL 1   levothyroxine  (SYNTHROID ) 100 MCG tablet TAKE 1 TABLET(100 MCG) BY MOUTH DAILY 90 tablet 3   methocarbamol  (ROBAXIN ) 500 MG tablet TAKE 1 TABLET(500 MG) BY MOUTH EVERY 8 HOURS AS NEEDED FOR MUSCLE SPASMS 60 tablet 1   NOVOLOG  FLEXPEN 100 UNIT/ML FlexPen Inject 10-30 Units into the skin 3 (three) times daily with meals. 90 mL 2   Olopatadine HCl (PATADAY OP) Apply to eye.     OVER THE COUNTER MEDICATION 1 capsule daily. Probiotic renew life      sertraline  (ZOLOFT ) 100 MG tablet TAKE 2 TABLETS BY MOUTH EVERY DAY 60 tablet 1   [DISCONTINUED] EFFEXOR XR 150 MG 24 hr capsule 375 mg daily.  (Patient not taking: Reported on 02/20/2024)     No current facility-administered medications on file prior to visit.    Allergies  Allergen Reactions   Latex Itching and Swelling   Fluticasone      Nose bleeds   Metformin  And Related     GI side effects   Pravastatin      myalgias    Assessment/Plan:  1. Hyperlipidemia -  Hyperlipidemia Assessment: LDL-C is above goal of less than 70 due to diabetes and coronary calcium  score  She has been intolerant to 3 different statins We discussed reason behind treating LDL cholesterol, medication options including trying a different statin or PCSK9 inhibitors.  Reviewed Repatha injection technique, side effects, cost and dosing. Not much physical activity but is working to try to increase this-we talked about both the physical and mental benefits of this No alcohol or tobacco Does have  family history of heart disease and peripheral vascular disease  Plan: Submit prior authorization for Repatha Repeat labs at endocrinologist in a little over 3 months Increase physical activity     Thank you,  Tejay Hubert D Lorian Yaun, Pharm.[name], CPP [facility] of [facility] [address] [place], [place] [address]  Phone: [phone]; Fax: [phone]

## 2024-04-15 NOTE — Telephone Encounter (Signed)
 Pharmacy Patient Advocate Encounter  Received notification from [employer] that Prior Authorization for Repatha has been DENIED.  Full denial letter will be uploaded to the media tab. See denial reason below.    PA #/Case ID/Reference #: [id number]

## 2024-04-15 NOTE — Telephone Encounter (Signed)
 Called pt and LVM for her to call back. She will need to come for updated labs. Orders placed.

## 2024-04-15 NOTE — Addendum Note (Signed)
 Addended by: Adaja Wander D on: 04/15/2024 10:28 AM   Modules accepted: Orders

## 2024-04-16 ENCOUNTER — Encounter: Payer: Self-pay | Admitting: Pharmacist

## 2024-04-21 ENCOUNTER — Ambulatory Visit (INDEPENDENT_AMBULATORY_CARE_PROVIDER_SITE_OTHER): Admitting: Psychology

## 2024-04-21 DIAGNOSIS — F3341 Major depressive disorder, recurrent, in partial remission: Secondary | ICD-10-CM

## 2024-04-21 DIAGNOSIS — F411 Generalized anxiety disorder: Secondary | ICD-10-CM | POA: Diagnosis not present

## 2024-04-21 DIAGNOSIS — F4310 Post-traumatic stress disorder, unspecified: Secondary | ICD-10-CM

## 2024-04-21 NOTE — Progress Notes (Signed)
 [facility] Counselor/Therapist Progress Note  Patient ID: [name], MRN: [id number]    Date: 04/21/24  Time Spent: 1:05 pm - 1:56 pm : 51 Minutes  Treatment Type: Individual Therapy.  Reported Symptoms: Depression, anxiety, and PTSD.   Mental Status Exam: Appearance:  Neat and Well Groomed     Behavior: Appropriate  Motor: Normal  Speech/Language:  Clear and Coherent  Affect: Appropriate  Mood: dysthymic  Thought process: normal  Thought content:   WNL  Sensory/Perceptual disturbances:   WNL  Orientation: oriented to person, place, time/date, and situation  Attention: Good  Concentration: Good  Memory: WNL  Fund of knowledge:  Good  Insight:   Good  Judgment:  Good  Impulse Control: Good   Risk Assessment: Danger to Self:  No Self-injurious Behavior: No Danger to Others: No Duty to Warn:no Physical Aggression / Violence:No  Access to Firearms a concern: No  Gang Involvement:No   Subjective:   [name] participated in the session, in person in the office with the therapist, and consented to treatment [name] reviewed the events of the past week. [name] noted feeling really down. We worked on exploring this during the session. She noted the unavailability of a certain supplement, a lack of social support and engagement, weather change. She noted a lack of social engagement being the primarily contributing factor to her mood. She noted numerous losses in the past 5 years including family, friends, and pets. She noted a lack of close friends as an intimate circle. She noted barriers to this include discomfort leaving the home (the process of ~ 3-10), the distress of meeting new people, disrupting her typical daily routine (8-10 in distress), duration of experience. She noted possible avenues for socializing friend's group, senior center, connecting with acquaintance - [name]. We worked on [employer] to create a schedule, identify barriers,  preparation, managing self-talk. Therapist modeled this during the session. We worked on identifying ways to break down this task into more manageable tasks. We worked on processing this process, as a whole, during the session. Therapist validated [name]'s feelings and experience including the stress that people can experience with change. Therapist provided supportive therapy. A follow-up was scheduled for continued treatment which [name] benefits from.   Interventions: CBT   Diagnosis:   Major depressive disorder, recurrent episode, in partial remission (HCC)  GAD (generalized anxiety disorder)  Posttraumatic stress disorder  Psychiatric Treatment: Yes , Dr. Aktar. Please see chart for medication list. She continues to see his psychiatric provider regularly.   Treatment Plan:  Client Abilities/Strengths [name] is intelligent, self-aware, and motivated for change.    Support System: Father in [place]. Friends in area.   Client Treatment Preferences Outpatient Therapy.   Client Statement of Needs [name] would like to increase frequency of socialization, building local support group, engaging in consistent self-care including exercise, addressing difficulty with engagement in activities including health barriers, improving her overall health, processing past events, processing losses (mother, partner), managing symptoms, balancing responsibilities of day-to-day life.     Treatment Level Weekly  Symptoms  Anxiety: Feeling anxious, difficulty managing worry, worrying about different things, trouble relaxing, restlessness, irritability, and feeling afraid something awful might happen.    (Status: maintained) Depression: Feeling down, trouble sleeping, lethargy, over eating, difficulty with concentration.    (Status: maintained)  Goals:   [name] experiences symptoms of depression, anxiety, and PTSD.   Treatment plan signed and available on s-drive:  No, pending signature.  Target Date:  12/30/24 Frequency: Weekly  Progress: 0 Modality: individual    Therapist will provide referrals for additional resources as appropriate.  Therapist will provide psycho-education regarding [name]'s diagnosis and corresponding treatment approaches and interventions. Abigail Mullet, [name] will support the patient's ability to achieve the goals identified. will employ CBT, BA, Problem-solving, Solution Focused, Mindfulness,  coping skills, & other evidenced-based practices will be used to promote progress towards healthy functioning to help manage decrease symptoms associated with her diagnosis.   Reduce overall level, frequency, and intensity of the feelings of depression and anxiety evidenced by decreased overall symptoms from 6 to 7 days/week to 0 to 1 days/week per client report for at least 3 consecutive months. Verbally express understanding of the relationship between feelings of depression, anxiety and their impact on thinking patterns and behaviors. Verbalize an understanding of the role that distorted thinking plays in creating fears, excessive worry, and ruminations.  [name] participated in the creation of the treatment plan)   Abigail Mullet, [name]

## 2024-05-04 NOTE — Telephone Encounter (Signed)
 Spoke to patient. She will come in for updated labs

## 2024-05-05 ENCOUNTER — Ambulatory Visit (INDEPENDENT_AMBULATORY_CARE_PROVIDER_SITE_OTHER): Admitting: Psychology

## 2024-05-05 DIAGNOSIS — E039 Hypothyroidism, unspecified: Secondary | ICD-10-CM | POA: Diagnosis not present

## 2024-05-05 DIAGNOSIS — F3341 Major depressive disorder, recurrent, in partial remission: Secondary | ICD-10-CM | POA: Diagnosis not present

## 2024-05-05 DIAGNOSIS — F411 Generalized anxiety disorder: Secondary | ICD-10-CM

## 2024-05-05 DIAGNOSIS — E1165 Type 2 diabetes mellitus with hyperglycemia: Secondary | ICD-10-CM | POA: Diagnosis not present

## 2024-05-05 DIAGNOSIS — F4310 Post-traumatic stress disorder, unspecified: Secondary | ICD-10-CM

## 2024-05-05 DIAGNOSIS — Z794 Long term (current) use of insulin: Secondary | ICD-10-CM | POA: Diagnosis not present

## 2024-05-05 DIAGNOSIS — E782 Mixed hyperlipidemia: Secondary | ICD-10-CM | POA: Diagnosis not present

## 2024-05-05 NOTE — Progress Notes (Signed)
 [facility] Counselor/Therapist Progress Note  Patient ID: [name], MRN: [id number]    Date: 05/05/24  Time Spent: 1:00 pm - 2:03  pm : 63 Minutes  Treatment Type: Individual Therapy.  Reported Symptoms: Depression, anxiety, and PTSD.   Mental Status Exam: Appearance:  Neat and Well Groomed     Behavior: Appropriate  Motor: Normal  Speech/Language:  Clear and Coherent  Affect: Appropriate  Mood: dysthymic  Thought process: normal  Thought content:   WNL  Sensory/Perceptual disturbances:   WNL  Orientation: oriented to person, place, time/date, and situation  Attention: Good  Concentration: Good  Memory: WNL  Fund of knowledge:  Good  Insight:   Good  Judgment:  Good  Impulse Control: Good   Risk Assessment: Danger to Self:  No Self-injurious Behavior: No Danger to Others: No Duty to Warn:no Physical Aggression / Violence:No  Access to Firearms a concern: No  Gang Involvement:No   Subjective:   [name] participated in the session, in person in the office with the therapist, and consented to treatment [name] reviewed the events of the past week. She noted some improvement in her mood since restarting a specific supplement. She noted some improvement in her energy. She noted this translating into some energy towards de-cluttering. She noted recently reading some writing about de-cluttering can be cathartic and that clutter is often a barrier to engaging in activities that are important and valuable. She noted this resonating with her. She noted frustration regarding her previous roommate's destruction of the room and furniture. She noted making progress in this area but noted more work needing to be done. She provided additional information regarding the scope of her task and noted this task being significant. She noted her drive to be more social and noted that a cluttered home being a barrier. She noted being motivated to downsize her belongings but  noted need to help with this process. She noted her interest in reconnecting with a community that she previously was a part of in the past and noted her hope to participate in this next year during their annual conference. Therapist praised [name] for her effort between session and her intent to engage in social activities that are meaningful to her. Therapist encouraged [name] to continue her work towards organizing and de-cluttering. Therapist validated [name]'s feeling and experience and provided supportive therapy. A follow-up was scheduled for continued treatment, which she benefits from.   Interventions: CBT   Diagnosis:   Major depressive disorder, recurrent episode, in partial remission (HCC)  GAD (generalized anxiety disorder)  Posttraumatic stress disorder  Psychiatric Treatment: Yes , Dr. Aktar. Please see chart for medication list. She continues to see his psychiatric provider regularly.   Treatment Plan:  Client Abilities/Strengths [name] is intelligent, self-aware, and motivated for change.    Support System: Father in [place]. Friends in area.   Client Treatment Preferences Outpatient Therapy.   Client Statement of Needs [name] would like to increase frequency of socialization, building local support group, engaging in consistent self-care including exercise, addressing difficulty with engagement in activities including health barriers, improving her overall health, processing past events, processing losses (mother, partner), managing symptoms, balancing responsibilities of day-to-day life.     Treatment Level Weekly  Symptoms  Anxiety: Feeling anxious, difficulty managing worry, worrying about different things, trouble relaxing, restlessness, irritability, and feeling afraid something awful might happen.    (Status: maintained) Depression: Feeling down, trouble sleeping, lethargy, over eating, difficulty with concentration.    (  Status: maintained)  Goals:   [name] experiences  symptoms of depression, anxiety, and PTSD.   Treatment plan signed and available on s-drive:  No, pending signature.    Target Date: 12/30/24 Frequency: Weekly  Progress: 0 Modality: individual    Therapist will provide referrals for additional resources as appropriate.  Therapist will provide psycho-education regarding [name]'s diagnosis and corresponding treatment approaches and interventions. Abigail Mullet, [name] will support the patient's ability to achieve the goals identified. will employ CBT, BA, Problem-solving, Solution Focused, Mindfulness,  coping skills, & other evidenced-based practices will be used to promote progress towards healthy functioning to help manage decrease symptoms associated with her diagnosis.   Reduce overall level, frequency, and intensity of the feelings of depression and anxiety evidenced by decreased overall symptoms from 6 to 7 days/week to 0 to 1 days/week per client report for at least 3 consecutive months. Verbally express understanding of the relationship between feelings of depression, anxiety and their impact on thinking patterns and behaviors. Verbalize an understanding of the role that distorted thinking plays in creating fears, excessive worry, and ruminations.  [name] participated in the creation of the treatment plan)   Abigail Mullet, [name]

## 2024-05-06 ENCOUNTER — Telehealth: Payer: Self-pay | Admitting: Pharmacy Technician

## 2024-05-06 LAB — LIPID PANEL
Chol/HDL Ratio: 2.7 ratio (ref 0.0–4.4)
Cholesterol, Total: 269 mg/dL — ABNORMAL HIGH (ref 100–199)
HDL: 99 mg/dL (ref >39)
LDL Chol Calc (NIH): 157 mg/dL — ABNORMAL HIGH (ref 0–99)
Triglycerides: 82 mg/dL (ref 0–149)
VLDL Cholesterol Cal: 13 mg/dL (ref 5–40)

## 2024-05-06 LAB — MICROALBUMIN / CREATININE URINE RATIO
Creatinine, Urine: 51 mg/dL (ref 20–275)
Microalb Creat Ratio: 43 mg/g{creat} — ABNORMAL HIGH (ref <30)
Microalb, Ur: 2.2 mg/dL

## 2024-05-06 NOTE — Telephone Encounter (Signed)
 Pharmacy Patient Advocate Encounter   Received notification from Physician's Office that prior authorization for repatha resubmission with updted labs is required/requested.   Insurance verification completed.   The patient is insured through [facility] ADVANTAGE/RX ADVANCE .   Per test claim: PA required; PA submitted to above mentioned insurance via CoverMyMeds Key/confirmation #/EOC [id number] Status is pending

## 2024-05-07 ENCOUNTER — Telehealth: Payer: Self-pay

## 2024-05-07 NOTE — Telephone Encounter (Signed)
 Copied from CRM #[id number]. Topic: General - Other >> May 07, 2024  3:03 PM Aisha D wrote: Reason for CRM: Feliciano with [facility] is requesting to speak with Leita Jason BRISTLE, in regards to the pt's mental well being. Feliciano stated that she spoke with pt over the phone but has not seen her in person, and is concerned about mental well being. Feliciano stated that the pt sounded unhinged and in a manic state. Feliciano stated that she is just worried about the pt and wondered and felt like the provider should be informed of this information.

## 2024-05-08 NOTE — Telephone Encounter (Signed)
 Spoke with pt, pt did sound very mantic. Pt was repeating herself over and over asking me to google my grandparents so you can see where I'm coming from pt also stated  a friend of mine called because he said he couldn't find me but everyone knows where I am advised pt I was calling to check on her and insure pt in not suicidal or homicidal, pt stated she is too intelligent to be suicidal or homicidal and they are not coming and taking my agency. Advised pt I would advise PCP and for any further questions or concerns to not hesitate to give us  a call back.

## 2024-05-08 NOTE — Telephone Encounter (Signed)
 Patient's psychiatrist has been notified and received information back from that office that they would also be reaching out to check on the patient today.

## 2024-05-08 NOTE — Telephone Encounter (Signed)
 I verified with [employer] that welfare check was done at patient's house yesterday and she was not deemed a threat to herself or other and she refused to be taken anywhere for further evaluation.  As patient informed our office again this morning that she is not suicidal or homicidal and does not want to be transported anywhere, do not feel another welfare check is necessary. I will reach out to patient's psychiatrist as well.

## 2024-05-11 ENCOUNTER — Other Ambulatory Visit (HOSPITAL_COMMUNITY): Payer: Self-pay

## 2024-05-11 NOTE — Telephone Encounter (Signed)
 Pharmacy Patient Advocate Encounter  Received notification from [employer] that Prior Authorization for repatha has been APPROVED from 05/08/24 to 11/06/24 $47.00 one month  PA #/Case ID/Reference #: [id number]

## 2024-05-11 NOTE — Telephone Encounter (Signed)
 Called pt to let her know of approval. Mailbox full. Will send mychart message.

## 2024-05-13 NOTE — Telephone Encounter (Signed)
 During these conversation was she notified of LM's departure? I do not want to further confuse her. Please advise.

## 2024-05-19 ENCOUNTER — Ambulatory Visit: Admitting: Psychology

## 2024-05-20 ENCOUNTER — Emergency Department (HOSPITAL_COMMUNITY)
Admission: EM | Admit: 2024-05-20 | Discharge: 2024-05-21 | Disposition: A | Discharge status: Other Acute Inpatient Hospital | Attending: Emergency Medicine | Admitting: Emergency Medicine

## 2024-05-20 ENCOUNTER — Other Ambulatory Visit: Payer: Self-pay

## 2024-05-20 ENCOUNTER — Emergency Department (HOSPITAL_COMMUNITY)

## 2024-05-20 ENCOUNTER — Encounter (HOSPITAL_COMMUNITY): Payer: Self-pay | Admitting: *Deleted

## 2024-05-20 DIAGNOSIS — E119 Type 2 diabetes mellitus without complications: Secondary | ICD-10-CM | POA: Diagnosis not present

## 2024-05-20 DIAGNOSIS — R451 Restlessness and agitation: Secondary | ICD-10-CM | POA: Diagnosis present

## 2024-05-20 DIAGNOSIS — E039 Hypothyroidism, unspecified: Secondary | ICD-10-CM | POA: Insufficient documentation

## 2024-05-20 DIAGNOSIS — R4689 Other symptoms and signs involving appearance and behavior: Secondary | ICD-10-CM

## 2024-05-20 DIAGNOSIS — E876 Hypokalemia: Secondary | ICD-10-CM | POA: Insufficient documentation

## 2024-05-20 DIAGNOSIS — F329 Major depressive disorder, single episode, unspecified: Secondary | ICD-10-CM | POA: Diagnosis not present

## 2024-05-20 DIAGNOSIS — F29 Unspecified psychosis not due to a substance or known physiological condition: Secondary | ICD-10-CM | POA: Diagnosis not present

## 2024-05-20 DIAGNOSIS — Z91040 Latex allergy status: Secondary | ICD-10-CM | POA: Insufficient documentation

## 2024-05-20 DIAGNOSIS — F4310 Post-traumatic stress disorder, unspecified: Secondary | ICD-10-CM | POA: Diagnosis not present

## 2024-05-20 DIAGNOSIS — Z79899 Other long term (current) drug therapy: Secondary | ICD-10-CM | POA: Insufficient documentation

## 2024-05-20 DIAGNOSIS — J45909 Unspecified asthma, uncomplicated: Secondary | ICD-10-CM | POA: Insufficient documentation

## 2024-05-20 DIAGNOSIS — F419 Anxiety disorder, unspecified: Secondary | ICD-10-CM | POA: Insufficient documentation

## 2024-05-20 DIAGNOSIS — I10 Essential (primary) hypertension: Secondary | ICD-10-CM | POA: Diagnosis not present

## 2024-05-20 DIAGNOSIS — Z794 Long term (current) use of insulin: Secondary | ICD-10-CM | POA: Diagnosis not present

## 2024-05-20 DIAGNOSIS — Z79890 Hormone replacement therapy (postmenopausal): Secondary | ICD-10-CM | POA: Diagnosis not present

## 2024-05-20 DIAGNOSIS — E8889 Other specified metabolic disorders: Secondary | ICD-10-CM

## 2024-05-20 DIAGNOSIS — F309 Manic episode, unspecified: Secondary | ICD-10-CM | POA: Insufficient documentation

## 2024-05-20 DIAGNOSIS — R4182 Altered mental status, unspecified: Secondary | ICD-10-CM | POA: Diagnosis not present

## 2024-05-20 LAB — CBC WITH DIFFERENTIAL/PLATELET
Abs Immature Granulocytes: 0.04 K/uL (ref 0.00–0.07)
Basophils Absolute: 0 K/uL (ref 0.0–0.1)
Basophils Relative: 0 %
Eosinophils Absolute: 0.1 K/uL (ref 0.0–0.5)
Eosinophils Relative: 1 %
HCT: 35.4 % — ABNORMAL LOW (ref 36.0–46.0)
Hemoglobin: 11.1 g/dL — ABNORMAL LOW (ref 12.0–15.0)
Immature Granulocytes: 0 %
Lymphocytes Relative: 8 %
Lymphs Abs: 0.7 K/uL (ref 0.7–4.0)
MCH: 27.9 pg (ref 26.0–34.0)
MCHC: 31.4 g/dL (ref 30.0–36.0)
MCV: 88.9 fL (ref 80.0–100.0)
Monocytes Absolute: 0.5 K/uL (ref 0.1–1.0)
Monocytes Relative: 6 %
Neutro Abs: 7.9 K/uL — ABNORMAL HIGH (ref 1.7–7.7)
Neutrophils Relative %: 85 %
Platelets: 177 K/uL (ref 150–400)
RBC: 3.98 MIL/uL (ref 3.87–5.11)
RDW: 14.8 % (ref 11.5–15.5)
WBC: 9.3 K/uL (ref 4.0–10.5)
nRBC: 0 % (ref 0.0–0.2)

## 2024-05-20 LAB — COMPREHENSIVE METABOLIC PANEL WITH GFR
ALT: 32 U/L (ref 0–44)
AST: 37 U/L (ref 15–41)
Albumin: 3.9 g/dL (ref 3.5–5.0)
Alkaline Phosphatase: 75 U/L (ref 38–126)
Anion gap: 29 — ABNORMAL HIGH (ref 5–15)
BUN: 8 mg/dL (ref 6–20)
CO2: 15 mmol/L — ABNORMAL LOW (ref 22–32)
Calcium: 9.8 mg/dL (ref 8.9–10.3)
Chloride: 97 mmol/L — ABNORMAL LOW (ref 98–111)
Creatinine, Ser: 1.06 mg/dL — ABNORMAL HIGH (ref 0.44–1.00)
GFR, Estimated: >60 mL/min (ref >60)
Glucose, Bld: 147 mg/dL — ABNORMAL HIGH (ref 70–99)
Potassium: 2.8 mmol/L — ABNORMAL LOW (ref 3.5–5.1)
Sodium: 141 mmol/L (ref 135–145)
Total Bilirubin: 0.4 mg/dL (ref 0.0–1.2)
Total Protein: 6.9 g/dL (ref 6.5–8.1)

## 2024-05-20 LAB — SALICYLATE LEVEL: Salicylate Lvl: <7 mg/dL — ABNORMAL LOW (ref 7.0–30.0)

## 2024-05-20 LAB — ACETAMINOPHEN LEVEL: Acetaminophen (Tylenol), Serum: <10 ug/mL — ABNORMAL LOW (ref 10–30)

## 2024-05-20 LAB — CBG MONITORING, ED: Glucose-Capillary: 120 mg/dL — ABNORMAL HIGH (ref 70–99)

## 2024-05-20 MED ORDER — SODIUM CHLORIDE 0.9 % IV BOLUS
1000.0000 mL | Freq: Once | INTRAVENOUS | Status: AC
  Administered 2024-05-20: 1000 mL via INTRAVENOUS

## 2024-05-20 MED ORDER — ZIPRASIDONE MESYLATE 20 MG IM SOLR
20.0000 mg | Freq: Once | INTRAMUSCULAR | Status: AC
  Administered 2024-05-20: 20 mg via INTRAMUSCULAR
  Filled 2024-05-20: qty 20

## 2024-05-20 MED ORDER — POTASSIUM CHLORIDE 10 MEQ/100ML IV SOLN
10.0000 meq | INTRAVENOUS | Status: AC
  Administered 2024-05-20 (×2): 10 meq via INTRAVENOUS
  Filled 2024-05-20 (×2): qty 100

## 2024-05-20 MED ORDER — STERILE WATER FOR INJECTION IJ SOLN
INTRAMUSCULAR | Status: AC
  Administered 2024-05-20: 10 mL
  Filled 2024-05-20: qty 10

## 2024-05-20 NOTE — ED Notes (Signed)
 Unable to obtain VS, assess or treat. Remains uncooperative, manic. PD present. EDPA into see pt. Orders received for Geodon .
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
## 2024-05-20 NOTE — ED Notes (Signed)
 Patient phone has been placed in a patient belongings bag and placed in patient belongings 16-18 cabinet.

## 2024-05-20 NOTE — ED Triage Notes (Signed)
 [facility] [employer] [facility] for agitated, beligerent, uncooperative, delusional, manic, paranoid. [employer] and [facility] ([facility] w/ [facility]) present. IVC'd by boyfriend. Escalating over last month.

## 2024-05-20 NOTE — ED Notes (Signed)
 Extensive de escalation attempted for an hour with patient prior to requiring further escalation. Medication administered to patient with help of security and then patient was then restrained to the bed. Patient denies any current needs at this time after using the floor to relieve herself during de escalation. Lights dimmed for patient comfort.

## 2024-05-21 ENCOUNTER — Encounter (HOSPITAL_COMMUNITY): Payer: Self-pay | Admitting: Psychiatry

## 2024-05-21 ENCOUNTER — Encounter (HOSPITAL_COMMUNITY): Payer: Self-pay

## 2024-05-21 ENCOUNTER — Other Ambulatory Visit: Payer: Self-pay

## 2024-05-21 ENCOUNTER — Inpatient Hospital Stay (HOSPITAL_COMMUNITY)
Admission: AD | Admit: 2024-05-21 | Discharge: 2024-06-08 | DRG: 885 | Disposition: A | Discharge status: Home / Self Care | Source: Intra-hospital

## 2024-05-21 DIAGNOSIS — Z794 Long term (current) use of insulin: Secondary | ICD-10-CM

## 2024-05-21 DIAGNOSIS — F312 Bipolar disorder, current episode manic severe with psychotic features: Principal | ICD-10-CM | POA: Diagnosis present

## 2024-05-21 DIAGNOSIS — Z91148 Patient's other noncompliance with medication regimen for other reason: Secondary | ICD-10-CM

## 2024-05-21 DIAGNOSIS — F419 Anxiety disorder, unspecified: Secondary | ICD-10-CM | POA: Diagnosis present

## 2024-05-21 DIAGNOSIS — F4310 Post-traumatic stress disorder, unspecified: Secondary | ICD-10-CM

## 2024-05-21 DIAGNOSIS — Z818 Family history of other mental and behavioral disorders: Secondary | ICD-10-CM | POA: Diagnosis not present

## 2024-05-21 DIAGNOSIS — Z820 Family history of epilepsy and other diseases of the nervous system: Secondary | ICD-10-CM

## 2024-05-21 DIAGNOSIS — K219 Gastro-esophageal reflux disease without esophagitis: Secondary | ICD-10-CM | POA: Diagnosis present

## 2024-05-21 DIAGNOSIS — F3110 Bipolar disorder, current episode manic without psychotic features, unspecified: Secondary | ICD-10-CM | POA: Diagnosis not present

## 2024-05-21 DIAGNOSIS — E119 Type 2 diabetes mellitus without complications: Secondary | ICD-10-CM | POA: Diagnosis not present

## 2024-05-21 DIAGNOSIS — F329 Major depressive disorder, single episode, unspecified: Secondary | ICD-10-CM

## 2024-05-21 DIAGNOSIS — Z79899 Other long term (current) drug therapy: Secondary | ICD-10-CM | POA: Diagnosis not present

## 2024-05-21 DIAGNOSIS — I10 Essential (primary) hypertension: Secondary | ICD-10-CM | POA: Diagnosis not present

## 2024-05-21 DIAGNOSIS — F23 Brief psychotic disorder: Secondary | ICD-10-CM | POA: Diagnosis not present

## 2024-05-21 DIAGNOSIS — F29 Unspecified psychosis not due to a substance or known physiological condition: Secondary | ICD-10-CM

## 2024-05-21 DIAGNOSIS — Z888 Allergy status to other drugs, medicaments and biological substances status: Secondary | ICD-10-CM

## 2024-05-21 DIAGNOSIS — Z91040 Latex allergy status: Secondary | ICD-10-CM

## 2024-05-21 DIAGNOSIS — Z833 Family history of diabetes mellitus: Secondary | ICD-10-CM

## 2024-05-21 DIAGNOSIS — Z560 Unemployment, unspecified: Secondary | ICD-10-CM | POA: Diagnosis not present

## 2024-05-21 DIAGNOSIS — E039 Hypothyroidism, unspecified: Secondary | ICD-10-CM | POA: Diagnosis present

## 2024-05-21 DIAGNOSIS — E669 Obesity, unspecified: Secondary | ICD-10-CM | POA: Diagnosis not present

## 2024-05-21 DIAGNOSIS — F309 Manic episode, unspecified: Secondary | ICD-10-CM | POA: Insufficient documentation

## 2024-05-21 DIAGNOSIS — Z8249 Family history of ischemic heart disease and other diseases of the circulatory system: Secondary | ICD-10-CM | POA: Diagnosis not present

## 2024-05-21 DIAGNOSIS — Z79890 Hormone replacement therapy (postmenopausal): Secondary | ICD-10-CM

## 2024-05-21 DIAGNOSIS — G4700 Insomnia, unspecified: Secondary | ICD-10-CM | POA: Diagnosis present

## 2024-05-21 DIAGNOSIS — E1165 Type 2 diabetes mellitus with hyperglycemia: Secondary | ICD-10-CM | POA: Diagnosis not present

## 2024-05-21 DIAGNOSIS — E611 Iron deficiency: Secondary | ICD-10-CM | POA: Diagnosis present

## 2024-05-21 LAB — BASIC METABOLIC PANEL WITH GFR
Anion gap: 24 — ABNORMAL HIGH (ref 5–15)
BUN: 7 mg/dL (ref 6–20)
CO2: 17 mmol/L — ABNORMAL LOW (ref 22–32)
Calcium: 8.9 mg/dL (ref 8.9–10.3)
Chloride: 101 mmol/L (ref 98–111)
Creatinine, Ser: 0.97 mg/dL (ref 0.44–1.00)
GFR, Estimated: >60 mL/min (ref >60)
Glucose, Bld: 133 mg/dL — ABNORMAL HIGH (ref 70–99)
Potassium: 3.5 mmol/L (ref 3.5–5.1)
Sodium: 141 mmol/L (ref 135–145)

## 2024-05-21 LAB — URINE DRUG SCREEN
Amphetamines: NEGATIVE
Barbiturates: NEGATIVE
Benzodiazepines: NEGATIVE
Cocaine: NEGATIVE
Fentanyl: NEGATIVE
Methadone Scn, Ur: NEGATIVE
Opiates: NEGATIVE
Tetrahydrocannabinol: NEGATIVE

## 2024-05-21 LAB — CK: Total CK: 310 U/L — ABNORMAL HIGH (ref 38–234)

## 2024-05-21 LAB — ETHANOL: Alcohol, Ethyl (B): <15 mg/dL

## 2024-05-21 LAB — BLOOD GAS, VENOUS
Acid-base deficit: 5.8 mmol/L — ABNORMAL HIGH (ref 0.0–2.0)
Bicarbonate: 20.2 mmol/L (ref 20.0–28.0)
O2 Saturation: 55 %
Patient temperature: 37
pCO2, Ven: 41 mmHg — ABNORMAL LOW (ref 44–60)
pH, Ven: 7.3 (ref 7.25–7.43)
pO2, Ven: 35 mmHg (ref 32–45)

## 2024-05-21 LAB — PREGNANCY, URINE: Preg Test, Ur: NEGATIVE

## 2024-05-21 LAB — BETA-HYDROXYBUTYRIC ACID: Beta-Hydroxybutyric Acid: >8 mmol/L — ABNORMAL HIGH (ref 0.05–0.27)

## 2024-05-21 LAB — LACTIC ACID, PLASMA: Lactic Acid, Venous: 0.6 mmol/L (ref 0.5–1.9)

## 2024-05-21 MED ORDER — MAGNESIUM SULFATE 2 GM/50ML IV SOLN
2.0000 g | Freq: Once | INTRAVENOUS | Status: AC
  Administered 2024-05-21: 2 g via INTRAVENOUS
  Filled 2024-05-21: qty 50

## 2024-05-21 MED ORDER — POTASSIUM CHLORIDE 10 MEQ/100ML IV SOLN
10.0000 meq | INTRAVENOUS | Status: AC
  Administered 2024-05-21 (×2): 10 meq via INTRAVENOUS
  Filled 2024-05-21 (×2): qty 100

## 2024-05-21 MED ORDER — POTASSIUM CHLORIDE 20 MEQ PO PACK
40.0000 meq | PACK | Freq: Once | ORAL | Status: DC
  Filled 2024-05-21: qty 2

## 2024-05-21 MED ORDER — OLANZAPINE 5 MG PO TABS
5.0000 mg | ORAL_TABLET | Freq: Every day | ORAL | Status: DC
Start: 2024-05-21 — End: 2024-05-21

## 2024-05-21 MED ORDER — MAGNESIUM HYDROXIDE 400 MG/5ML PO SUSP: 30.0000 mL | Freq: Every day | ORAL | Status: DC | PRN

## 2024-05-21 MED ORDER — LORAZEPAM 1 MG PO TABS
2.0000 mg | ORAL_TABLET | Freq: Once | ORAL | Status: AC
  Administered 2024-05-21: 2 mg via ORAL
  Filled 2024-05-21: qty 2

## 2024-05-21 MED ORDER — ALUM & MAG HYDROXIDE-SIMETH 200-200-20 MG/5ML PO SUSP: 30.0000 mL | ORAL | Status: DC | PRN

## 2024-05-21 MED ORDER — HYDROXYZINE HCL 25 MG PO TABS
25.0000 mg | ORAL_TABLET | Freq: Three times a day (TID) | ORAL | Status: DC | PRN
  Administered 2024-05-23 – 2024-06-04 (×5): 25 mg via ORAL
  Filled 2024-05-21 (×9): qty 1

## 2024-05-21 MED ORDER — OLANZAPINE 5 MG PO TBDP
5.0000 mg | ORAL_TABLET | Freq: Three times a day (TID) | ORAL | Status: DC | PRN
  Administered 2024-05-22 – 2024-05-24 (×2): 5 mg via ORAL

## 2024-05-21 MED ORDER — OLANZAPINE 5 MG PO TBDP
5.0000 mg | ORAL_TABLET | Freq: Three times a day (TID) | ORAL | Status: DC | PRN
  Filled 2024-05-21 (×2): qty 1

## 2024-05-21 MED ORDER — OLANZAPINE 5 MG PO TABS
5.0000 mg | ORAL_TABLET | Freq: Every day | ORAL | Status: DC
  Filled 2024-05-21: qty 2
  Filled 2024-05-21: qty 1

## 2024-05-21 MED ORDER — TRAZODONE HCL 50 MG PO TABS
50.0000 mg | ORAL_TABLET | Freq: Every evening | ORAL | Status: DC | PRN
  Filled 2024-05-21 (×2): qty 1

## 2024-05-21 MED ORDER — ACETAMINOPHEN 325 MG PO TABS: 650.0000 mg | ORAL_TABLET | Freq: Four times a day (QID) | ORAL | Status: DC | PRN

## 2024-05-21 NOTE — ED Notes (Signed)
Father provided with update

## 2024-05-21 NOTE — Group Note (Signed)
 Date:  05/21/2024 Time:  6:33 PM  Group Topic/Focus:  Goals Group:   The focus of this group is to help patients establish daily goals to achieve during treatment and discuss how the patient can incorporate goal setting into their daily lives to aide in recovery. Orientation:   The focus of this group is to educate the patient on the purpose and policies of crisis stabilization and provide a format to answer questions about their admission.  The group details unit policies and expectations of patients while admitted.    Participation Level:  Did Not Attend   [name] Molly 05/21/2024, 6:33 PM

## 2024-05-21 NOTE — Progress Notes (Signed)
 Patient admitted to unit, patient is disorganized, requiring a lot of redirection to answer admission questions. Patient states that she is here because of God who brought me to [facility] to help the people. Patient denies SI, HI, AVH, tangentiality noted during interview with patient. Patient oriented to unit and her room. Patient given dinner, she is in her room eating, safety checks in place, pt safe in unit.

## 2024-05-21 NOTE — ED Notes (Signed)
 Mickiel called and received report. [facility] called at this time as well.

## 2024-05-21 NOTE — Progress Notes (Signed)
(  Sleep Hours) -5.5  (Any PRNs that were needed, meds refused, or side effects to meds)- Pt refused HS Zyprexa   (Any disturbances and when (visitation, over night)- pt spent a lot of time in the bathroom  spilling excessive water  on the floor that needed to be cleaned up by staff. Pt appeared with manic behaviors , pt questioned writer's credentials even after being told numerous times.    (Concerns raised by the patient)- pt did not want to take medication  (SI/HI/AVH)- +ve AH , denies SI / HI / VH

## 2024-05-21 NOTE — Plan of Care (Signed)
   Problem: Activity: Goal: Sleeping patterns will improve Outcome: Progressing   Problem: Safety: Goal: Periods of time without injury will increase Outcome: Progressing

## 2024-05-21 NOTE — Tx Team (Signed)
 Initial Treatment Plan 05/21/2024 6:34 PM [name] FMW:[id number]    PATIENT STRESSORS: Other: God Brought me here     PATIENT STRENGTHS: Religious Affiliation    PATIENT IDENTIFIED PROBLEMS: Manic                     DISCHARGE CRITERIA:  Improved stabilization in mood, thinking, and/or behavior Verbal commitment to aftercare and medication compliance  PRELIMINARY DISCHARGE PLAN: Return to previous living arrangement Return to previous work or school arrangements  PATIENT/FAMILY INVOLVEMENT: This treatment plan has been presented to and reviewed with the patient, [name].  The patient has been given the opportunity to ask questions and make suggestions.  Huel Mall, RN 05/21/2024, 6:34 PM

## 2024-05-21 NOTE — BH Assessment (Signed)
@  610; Per IRIS coordinator, J. Wing, deferring this pt back to in-house provider in the AM.

## 2024-05-21 NOTE — Group Note (Signed)
 Date:  05/21/2024 Time:  8:54 PM  Group Topic/Focus:  Wrap-Up Group:   The focus of this group is to help patients review their daily goal of treatment and discuss progress on daily workbooks.    Participation Level:  Did Not Attend   Damali Broadfoot Dacosta 05/21/2024, 8:54 PM

## 2024-05-21 NOTE — ED Notes (Signed)
 Two bags of belongings placed in locker 29

## 2024-05-21 NOTE — Progress Notes (Signed)
 Patient unable to sign consents at this time, unable to redirect patient to sign, continues with tangential speech, refuses to sign consents.
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
## 2024-05-21 NOTE — Consult Note (Signed)
 [facility] Psychiatric Consult Initial  Patient Name: .[name]  MRN: [id number]  DOB: [birth year]  Consult Order details:  Orders (From admission, onward)     Start     Ordered   05/20/24 2345  CONSULT TO CALL ACT TEAM       Ordering Provider: Minnie Tinnie FORBES, PA  Provider:  (Not yet assigned)  Question:  Reason for Consult?  Answer:  manic   05/20/24 2344             Mode of Visit: In person    Psychiatry Consult Evaluation  Service Date: May 21, 2024 LOS:  LOS: 0 days  Chief Complaint  I am having the best time I can  Primary Psychiatric Diagnoses  MDD 2.  Anxiety 3. psychosis 4. PTSD  Assessment  [name] is a 58 y.o. female admitted: Presented to the EDfor 05/20/2024  5:36 PM for increased manic behaviors. She carries the psychiatric diagnoses of MDD and has a past medical history of  DM type 2, controlled.   Her current presentation of Manic state  is most consistent with her reported behaviors for the past couple of weeks. She meets criteria for Inpatient  for stabilization,  based on presenting symptoms.  Current outpatient psychotropic medications include  Klonopin ,  and sertraline  and historically she has had a therapeutic  response to these medications. Per petitioner, patient was probably not  compliant with medications prior to admission as evidenced by her increasing manic symptoms along with unsafe behaviors. On initial examination, patient is manic, anxious, labile, talkative and appears to be exhausted. Please see plan below for detailed recommendations.   Diagnoses:  Active Hospital problems: Active Problems:   Manic state [facility])  Psychosis   Plan   ## Psychiatric Medication Recommendations:  To be determined  ## Medical Decision Making Capacity: Not specifically addressed in this encounter  ## Further Work-up:  -- To be determined  -- Pertinent labwork reviewed earlier this admission includes: All labs reviewed per ED  protocol   ## Disposition:-- We recommend inpatient psychiatric hospitalization after medical hospitalization. Patient has been involuntarily committed on 05/20/2024 at 3 pm.   ## Behavioral / Environmental: - No specific recommendations at this time.     ## Safety and Observation Level:  - Based on my clinical evaluation, I estimate the patient to be at low risk of self harm in the current setting. - At this time, we recommend  routine. This decision is based on my review of the chart including patient's history and current presentation, interview of the patient, mental status examination, and consideration of suicide risk including evaluating suicidal ideation, plan, intent, suicidal or self-harm behaviors, risk factors, and protective factors. This judgment is based on our ability to directly address suicide risk, implement suicide prevention strategies, and develop a safety plan while the patient is in the clinical setting. Please contact our team if there is a concern that risk level has changed.  CSSR Risk Category:C-SSRS RISK CATEGORY: No Risk  Suicide Risk Assessment: Patient has following modifiable risk factors for suicide: NA, which we are addressing by recommending routine safety precautions. Patient has following non-modifiable or demographic risk factors for suicide: NA Patient has the following protective factors against suicide: Pets in the home, no history of suicide attempts, and no history of NSSIB  Thank you for this consult request. Recommendations have been communicated to the primary team.  We will recommend Inpatient admission for stabilization  at this time,  based on presenting symptoms as well as collateral information provided by the petitioner.  Randall Bouquet, NP       History of Present Illness  Relevant Aspects of Hospital ED Course:  Admitted on 05/20/2024 for increased manic behaviors.   Patient Report:  [name] is a 58 year-old female who  presented to [facility] under involuntary commitment that states:  Respondent has been diagnosed with PTSD and does not take prescribed medication. Respondent is not eating or sleeping. Respondent is delusional and thinks neighbors are out to get her. Respondent knocks on her neighbors' doors speaking incoherently. Respondent allows strange people in her home stating that they will help her save the world.   Upon face to face assessment: patient is in bed awake. She is casually dressed and groomed. She is manic, talkative, labile,  and intrusive, but pleasant. Patient is incoherent with disorganized thought process. She is endorsing some grandiosity, reporting that she has been working on finding the cure for different diseases. Patient states I am having the best time I can.  Patient states I am the daughter of a [place] Jewish immigrant and, and, we have done great so fat, its been a journey. Patient has good eye contact but appears to be exhausted.  She does admits to hx of PTSD and depression/anxiety. She reports a hx of taking medications including Klonopin  and Atenolol . She reports feeling healthy, that her medical condition is stabilized.  Speech is pressured. Patient reports there is nothing wrong with her, that she is enjoying her life.  She reports that she has a friend in her neighborhood who checks on her but no family around. Patient denies suicidal/homicidal ideations. She denies hearing voices and does not appear to be responding to internal stimuli.  Psych ROS:  Depression: Reported Anxiety:  Reported Mania (lifetime and current): Currently active Psychosis: (lifetime and current): Currently active  Collateral information:  Contacted petitioner Mal Davies (friend) [phone] who reported:  Patient had not been eating or sleeping for many days. Was manic and talkative, intrusive to neighbors, wandering around, banging at neighbors' doors, and bringing strangers into her home, unaware  of the risks. Patient was reporting to neighbors that she wants to heal chronic diseases. She was hugging  everybody on the street, reporting that she is in contact with [employer]. Patient was self-medicating and was spending time looking up medications online. She might not be taking her psychotropics. Patient is usually a nice person. She has 2 dogs at home and has no children. Her other family members live out of state.   Review of Systems  Constitutional: Negative.   HENT: Negative.    Eyes: Negative.   Respiratory: Negative.    Cardiovascular: Negative.   Gastrointestinal: Negative.   Genitourinary: Negative.   Musculoskeletal: Negative.   Skin: Negative.   Neurological: Negative.   Endo/Heme/Allergies: Negative.   Psychiatric/Behavioral:  Positive for depression. The patient is nervous/anxious and has insomnia.      Psychiatric and Social History  Psychiatric History:  Information collected from patient's neighbor/friend  Mal Davies [phone]  Prev Dx/Sx: MDD, PTSD Current Psych Provider: NA Home Meds (current): NA Previous Med Trials: Zoloft , Klonopin  Therapy: NA  Prior Psych Hospitalization: NA  Prior Self Harm: NA Prior Violence: NA  Family Psych History: NA Family Hx suicide: NA  Social History:  Developmental Hx: NA Educational Hx: NA Occupational Hx: NA Legal Hx: NA Living Situation: Lives alone. Has 2 pets Spiritual Hx: NA Access to weapons/lethal means: NA  Substance History Alcohol: NA  Type of alcohol NA Last Drink NA Number of drinks per day NANA History of alcohol withdrawal seizures NA History of DT's NA Tobacco: NA Illicit drugs: NA Prescription drug abuse: NA Rehab hx: NA  Exam Findings  Physical Exam:  Vital Signs:  Temp:  [97.6 F (36.4 C)-98.5 F (36.9 C)] 97.6 F (36.4 C) (09/04 1036) Pulse Rate:  [74-114] 102 (09/04 0840) Resp:  [15-21] 18 (09/04 0840) BP: (114-163)/(55-105) 150/78 (09/04 0840) SpO2:  [90 %-100 %] 100 %  (09/04 0840) Weight:  [80.3 kg] 80.3 kg (09/03 1755) Blood pressure (!) 150/78, pulse (!) 102, temperature 97.6 F (36.4 C), temperature source Oral, resp. rate 18, weight 80.3 kg, SpO2 100%. Body mass index is 34.57 kg/m.  Physical Exam Vitals and nursing note reviewed.  Constitutional:      Appearance: Normal appearance.  HENT:     Head: Normocephalic and atraumatic.     Right Ear: Tympanic membrane normal.     Left Ear: Tympanic membrane normal.     Nose: Nose normal.     Mouth/Throat:     Mouth: Mucous membranes are moist.  Eyes:     Extraocular Movements: Extraocular movements intact.     Pupils: Pupils are equal, round, and reactive to light.  Cardiovascular:     Rate and Rhythm: Normal rate.     Pulses: Normal pulses.  Pulmonary:     Effort: Pulmonary effort is normal.  Musculoskeletal:        General: Normal range of motion.     Cervical back: Normal range of motion.  Skin:    General: Skin is warm.  Neurological:     General: No focal deficit present.     Mental Status: She is alert.     Mental Status Exam: General Appearance: Casual  Orientation:  Other:  disoriented to situation  Memory:  Immediate;   Fair Recent;   Poor Remote;   Poor  Concentration:  Concentration: Poor and Attention Span: Poor  Recall:  Poor  Attention  Fair  Eye Contact:  Good  Speech:  Pressured  Language:  Fair  Volume:  Increased  Mood: labile, manic, anxious  Affect:  Labile  Thought Process:  Disorganized  Thought Content:  Illogical, Paranoid Ideation, and Tangential  Suicidal Thoughts:  No  Homicidal Thoughts:  No  Judgement:  Poor  Insight:  Shallow  Psychomotor Activity:  Restlessness  Akathisia:  NA  Fund of Knowledge:  Fair      Assets:  Manufacturing systems engineer Desire for Improvement Housing Resilience  Cognition:  WNL  ADL's:  Intact  AIMS (if indicated):   NA     Other History   These have been pulled in through the EMR, reviewed, and updated if  appropriate.  Family History:  The patient's family history includes Alzheimer's disease in her father and maternal grandmother; Bipolar disorder in her mother; Diabetes in her mother; Heart disease in her mother and paternal grandfather; Hypertension in her father and mother.  Medical History: Past Medical History:  Diagnosis Date   Abnormal Papanicolaou smear of cervix with positive human papilloma virus (HPV) test 02/2017   Anxiety    Asthma    Depression    Diabetes mellitus type II    High blood pressure    IBS (irritable bowel syndrome)    Insomnia    Migraines    PCOS (polycystic ovarian syndrome)    PTSD (post-traumatic stress disorder)    Seasonal allergies  Thyroid  disease     Surgical History: Past Surgical History:  Procedure Laterality Date   CHOLECYSTECTOMY     GALLBLADDER SURGERY       Medications:   Current Facility-Administered Medications:    potassium chloride  (KLOR-CON ) packet 40 mEq, 40 mEq, Oral, Once, Minnie Tinnie BRAVO, PA  Current Outpatient Medications:    albuterol  (VENTOLIN  HFA) 108 (90 Base) MCG/ACT inhaler, Inhale 2 puffs into the lungs every 4 (four) hours as needed for wheezing or shortness of breath., Disp: 18 g, Rfl: 5   Alpha-Lipoic Acid 600 MG CAPS, Take 300 mg by mouth 2 (two) times daily. , Disp: , Rfl:    atenolol  (TENORMIN ) 100 MG tablet, TAKE 1 TABLET(100 MG) BY MOUTH TWICE DAILY, Disp: 180 tablet, Rfl: 3   Blood Glucose Monitoring Suppl (ONE TOUCH ULTRA MINI) w/Device KIT, Use to check blood sugar 2 times a day., Disp: 1 kit, Rfl: 0   Blood Glucose Monitoring Suppl (ONETOUCH VERIO) w/Device KIT, Use as instructed to check blood sugar 4X daily, Disp: 1 kit, Rfl: 0   clobetasol  cream (TEMOVATE ) 0.05 %, Apply 1 application topically 2 (two) times daily., Disp: 30 g, Rfl: 1   clonazePAM  (KLONOPIN ) 0.5 MG tablet, Take 0.5 tablets (0.25 mg total) by mouth daily., Disp: 15 tablet, Rfl: 1   Continuous Glucose Receiver (FREESTYLE LIBRE 2  READER) DEVI, 1 each by Does not apply route daily., Disp: 1 each, Rfl: 0   Continuous Glucose Sensor (FREESTYLE LIBRE 3 PLUS SENSOR) MISC, Inject 1 Device into the skin continuous. Change every 15 days, Disp: 6 each, Rfl: 1   Digestive Enzymes (ENZYME DIGEST PO), Take by mouth., Disp: , Rfl:    estradiol  (ESTRACE  VAGINAL) 0.1 MG/GM vaginal cream, Place 1 Applicatorful vaginally at bedtime. Use 3 times a week, Disp: 42.5 g, Rfl: 12   fexofenadine  (ALLEGRA ) 180 MG tablet, Take 120 mg by mouth in the morning and at bedtime., Disp: , Rfl:    fluvastatin  XL (LESCOL  XL) 80 MG 24 hr tablet, TAKE 1 TABLET(80 MG) BY MOUTH DAILY, Disp: 90 tablet, Rfl: 3   glucose blood (ONETOUCH ULTRA) test strip, Use to check blood sugar once a day., Disp: 100 strip, Rfl: 11   glucose blood (ONETOUCH VERIO) test strip, Use as instructed to check blood sugar 4X daily, Disp: 300 each, Rfl: 2   Insulin  Pen Needle (BD PEN NEEDLE NANO 2ND GEN) 32G X 4 MM MISC, USE 4 TIMES DAILY, Disp: 400 each, Rfl: 3   irbesartan  (AVAPRO ) 75 MG tablet, TAKE 1 TABLET(75 MG) BY MOUTH DAILY, Disp: 90 tablet, Rfl: 3   LANTUS  SOLOSTAR 100 UNIT/ML Solostar Pen, Inject 26 Units into the skin daily., Disp: 45 mL, Rfl: 1   levothyroxine  (SYNTHROID ) 100 MCG tablet, TAKE 1 TABLET(100 MCG) BY MOUTH DAILY, Disp: 90 tablet, Rfl: 3   methocarbamol  (ROBAXIN ) 500 MG tablet, TAKE 1 TABLET(500 MG) BY MOUTH EVERY 8 HOURS AS NEEDED FOR MUSCLE SPASMS, Disp: 60 tablet, Rfl: 1   NOVOLOG  FLEXPEN 100 UNIT/ML FlexPen, Inject 10-30 Units into the skin 3 (three) times daily with meals., Disp: 90 mL, Rfl: 2   Olopatadine HCl (PATADAY OP), Apply to eye., Disp: , Rfl:    OVER THE COUNTER MEDICATION, 1 capsule daily. Probiotic renew life , Disp: , Rfl:    sertraline  (ZOLOFT ) 100 MG tablet, TAKE 2 TABLETS BY MOUTH EVERY DAY, Disp: 60 tablet, Rfl: 1  Allergies: Allergies  Allergen Reactions   Latex Itching and Swelling   Fluticasone   Nose bleeds   Metformin  And Related      GI side effects   Pravastatin      myalgias    Randall Bouquet, NP

## 2024-05-21 NOTE — Progress Notes (Signed)
 Pt has been accepted to [facility] on 05/21/2024 . Bed assignment: 505-1   Pt meets inpatient criteria per Starlyn Patron, NP   Attending Physician will be Dr. Prentis  Report can be called to: -Adult unit: [phone]  Care Team Notified: [facility] Bretta Qua, RN, Ashley Shed, RN, Starlyn Patron, NP

## 2024-05-21 NOTE — BH Assessment (Signed)
 TTS consult will be completed by IRIS. IRIS Coordinator will communicate in established secure chat assessment time and provider name. Thanks

## 2024-05-21 NOTE — ED Provider Notes (Signed)
  Physical Exam  BP 123/76   Pulse 74   Temp 98.3 F (36.8 C) (Oral)   Resp (!) 21   Wt 80.3 kg   SpO2 95%   BMI 34.57 kg/m   Physical Exam  Procedures  Procedures  ED Course / MDM    Medical Decision Making Amount and/or Complexity of Data Reviewed Labs: ordered. Radiology: ordered.  Risk Prescription drug management. Decision regarding hospitalization.   Patient very disorganized.  Talking about biochemical alignment.  Becoming a little more agitated.  Pending psych eval this morning.       Patsey Lot, MD 05/21/24 [phone]

## 2024-05-21 NOTE — ED Notes (Addendum)
 Update was provided to petitioner of IVC. Information given to Psychiatric NP. Pt resting in bed with just rising and falling.

## 2024-05-21 NOTE — Progress Notes (Signed)
   05/21/24 1800  Psych Admission Type (Psych Patients Only)  Admission Status Involuntary  Psychosocial Assessment  Patient Complaints Agitation;Anxiety  Eye Contact Brief  Facial Expression Animated  Affect Euphoric  Speech Rapid  Interaction Assertive  Motor Activity Fidgety;Hyperactive  Appearance/Hygiene Disheveled;In scrubs  Behavior Characteristics Cooperative  Mood Anxious  Thought Process  Coherency Disorganized  Content Delusions  Delusions Religious  Perception UTA  Hallucination None reported or observed  Judgment Impaired  Confusion Mild  Danger to Self  Current suicidal ideation? Denies  Danger to Others  Danger to Others None reported or observed

## 2024-05-22 ENCOUNTER — Encounter (HOSPITAL_COMMUNITY): Payer: Self-pay

## 2024-05-22 ENCOUNTER — Telehealth: Payer: Self-pay

## 2024-05-22 DIAGNOSIS — F29 Unspecified psychosis not due to a substance or known physiological condition: Secondary | ICD-10-CM

## 2024-05-22 MED ORDER — LORAZEPAM 1 MG PO TABS
2.0000 mg | ORAL_TABLET | Freq: Three times a day (TID) | ORAL | Status: DC | PRN
  Administered 2024-05-22 – 2024-05-27 (×5): 2 mg via ORAL
  Filled 2024-05-22 (×5): qty 2

## 2024-05-22 MED ORDER — IRBESARTAN 75 MG PO TABS
75.0000 mg | ORAL_TABLET | Freq: Every day | ORAL | Status: DC
  Administered 2024-05-22 – 2024-06-07 (×17): 75 mg via ORAL
  Filled 2024-05-22 (×16): qty 1

## 2024-05-22 MED ORDER — LEVOTHYROXINE SODIUM 100 MCG PO TABS
100.0000 ug | ORAL_TABLET | Freq: Every day | ORAL | Status: DC
  Administered 2024-05-23 – 2024-06-08 (×17): 100 ug via ORAL
  Filled 2024-05-22 (×17): qty 1

## 2024-05-22 MED ORDER — ATENOLOL 50 MG PO TABS
50.0000 mg | ORAL_TABLET | Freq: Every day | ORAL | Status: DC
  Administered 2024-05-22 – 2024-05-23 (×2): 50 mg via ORAL
  Filled 2024-05-22: qty 2
  Filled 2024-05-22: qty 1

## 2024-05-22 MED ORDER — ALBUTEROL SULFATE HFA 108 (90 BASE) MCG/ACT IN AERS: 2.0000 | INHALATION_SPRAY | RESPIRATORY_TRACT | Status: DC | PRN

## 2024-05-22 MED ORDER — CLONAZEPAM 0.5 MG PO TABS
0.5000 mg | ORAL_TABLET | Freq: Every day | ORAL | Status: DC
  Filled 2024-05-22: qty 1

## 2024-05-22 NOTE — B[facility] Suicide Risk Assessment (Signed)
 Suicide Risk Assessment  Admission Assessment    [facility] Admission Suicide Risk Assessment   Nursing information obtained from:  Patient Demographic factors:  Living alone Current Mental Status:  NA Loss Factors:  NA Historical Factors:  Impulsivity Risk Reduction Factors:  Employed  Total Time spent with patient: 45 minutes  Principal Problem: Psychosis (HCC) Diagnosis:  Principal Problem:   Psychosis (HCC)  Subjective Data: This is the first inpatient psychiatric admission for [name] a 58 year old Caucasian female with prior psychiatric diagnoses significant for psychosis, PTSD, severe recurrent MDD, and anxiety, who presents involuntarily to [facility] from [facility] ED at [facility] for acute psychosis, agitation, belligerent behavior, delusional behavior, paranoid behavior and manic episode.  BAL less than 15, UDS negative for any substances.  Continued Clinical Symptoms:  Alcohol Use Disorder Identification Test Final Score (AUDIT): 0 The Alcohol Use Disorders Identification Test, Guidelines for Use in Primary Care, Second Edition.  World Science writer [facility]). Score between 0-7:  no or low risk or alcohol related problems. Score between 8-15:  moderate risk of alcohol related problems. Score between 16-19:  high risk of alcohol related problems. Score 20 or above:  warrants further diagnostic evaluation for alcohol dependence and treatment.  CLINICAL FACTORS:   Severe Anxiety and/or Agitation Bipolar Disorder:   Bipolar II More than one psychiatric diagnosis Previous Psychiatric Diagnoses and Treatments Medical Diagnoses and Treatments/Surgeries  Musculoskeletal: Strength & Muscle Tone: within normal limits Gait & Station: normal Patient leans: N/A  Psychiatric Specialty Exam:  Presentation  General Appearance:  Appropriate for Environment; Casual  Eye Contact: Good  Speech:No data recorded Speech  Volume: Normal  Handedness: Right  Mood and Affect  Mood: Euphoric  Affect: Congruent  Thought Process  Thought Processes: Disorganized; Irrevelant; Linear  Descriptions of Associations:Loose  Orientation:Full (Time, Place and Person)  Thought Content:Illogical; Scattered  History of Schizophrenia/Schizoaffective disorder:No data recorded Duration of Psychotic Symptoms:No data recorded Hallucinations:Hallucinations: None  Ideas of Reference:None  Suicidal Thoughts:Suicidal Thoughts: No  Homicidal Thoughts:Homicidal Thoughts: No  Sensorium  Memory: Immediate Poor; Recent Poor  Judgment: Poor  Insight: Poor  Executive Functions  Concentration: Poor  Attention Span: Poor  Recall: Poor  Fund of Knowledge: Poor  Language: Fair  Psychomotor Activity  Psychomotor Activity: Psychomotor Activity: Increased  Assets  Assets: Physical Health; Resilience  Sleep  Sleep: Sleep: Good Number of Hours of Sleep: 5.5  Physical Exam: Physical Exam Vitals and nursing note reviewed.  Constitutional:      General: She is not in acute distress.    Appearance: She is not ill-appearing.  HENT:     Head: Normocephalic.     Right Ear: External ear normal.     Left Ear: External ear normal.     Nose: Nose normal.     Mouth/Throat:     Mouth: Mucous membranes are moist.     Pharynx: Oropharynx is clear.  Eyes:     Extraocular Movements: Extraocular movements intact.  Cardiovascular:     Rate and Rhythm: Tachycardia present.  Pulmonary:     Effort: Pulmonary effort is normal. No respiratory distress.  Abdominal:     Comments: Deferred  Genitourinary:    Comments: Deferred Musculoskeletal:        General: Normal range of motion.     Cervical back: Normal range of motion.  Skin:    General: Skin is dry.  Neurological:     Mental Status: She is alert and oriented to person, place,  and time.  Psychiatric:     Comments: Floridly manic    Review of  Systems  Constitutional:  Negative for chills and fever.  HENT:  Negative for sore throat.   Eyes:  Negative for blurred vision.  Respiratory:  Negative for cough, sputum production, shortness of breath and wheezing.   Cardiovascular:  Negative for chest pain and palpitations.  Gastrointestinal:  Negative for abdominal pain, constipation, diarrhea, heartburn, nausea and vomiting.  Genitourinary:  Negative for dysuria.  Musculoskeletal:  Negative for falls.  Skin:  Negative for itching and rash.  Neurological:  Negative for dizziness and headaches.  Endo/Heme/Allergies:        See allergy  testing  Psychiatric/Behavioral:  Negative for depression, hallucinations, substance abuse and suicidal ideas. The patient is nervous/anxious. The patient does not have insomnia.    Blood pressure (!) 155/97, pulse (!) 105, temperature 98.3 F (36.8 C), temperature source Oral, resp. rate 18, height 5' 3 (1.6 m), weight 80 kg, SpO2 99%. Body mass index is 31.24 kg/m.  COGNITIVE FEATURES THAT CONTRIBUTE TO RISK:  Polarized thinking    SUICIDE RISK:   Severe:  Frequent, intense, and enduring suicidal ideation, specific plan, no subjective intent, but some objective markers of intent (i.e., choice of lethal method), the method is accessible, some limited preparatory behavior, evidence of impaired self-control, severe dysphoria/symptomatology, multiple risk factors present, and few if any protective factors, particularly a lack of social support.  PLAN OF CARE: Treatment Plan Summary: Daily contact with patient to assess and evaluate symptoms and progress in treatment and Medication management  Observation Level/Precautions:  15 minute checks  Laboratory:  CBC: Hemoglobin 11.1, HCT 35.4, otherwise normal Chemistry Profile: 217, glucose 133, anion gap 24, otherwise normal Folic Acid: Ordered HbAIC: 6.4 Lipid panel: Cholesterol 269, LDL 157, otherwise normal HCG: Not applicable UDS: Negative for any  substances UA: Ordered TSH ordered Vitamin B-12: Ordered Vitamin D 25-hydroxy: Ordered  Psychotherapy: Yes  Medications: See MAR  Consultations: Pending  Discharge Concerns: Safety  Estimated LOS: 3 to 7 days  Other:     Assessment: This is the first inpatient psychiatric admission for [name] a 58 year old Caucasian female with prior psychiatric diagnoses significant for psychosis, PTSD, severe recurrent MDD, and anxiety, who presents involuntarily to [facility] from [facility] ED at [facility] for acute psychosis, agitation, belligerent behavior, delusional behavior, paranoid behavior and manic episode.  BAL less than 15, UDS negative for any substances.  Physician Treatment Plan for Primary Diagnosis: Psychosis (HCC) and anxiety  Plan: Medications: --Continue olanzapine  5 mg p.o. nightly for psychosis --Continue hydroxyzine  tablet 25 mg p.o. 3 times daily as needed for anxiety -- Continue trazodone  tablet 50 mg p.o. q. nightly as needed for insomnia -- Continue Ativan  tablet 2 mg p.o. every 8 hours as needed for anxiety, agitation, second-line after olanzapine   Continue BH Agitation Protocol   Medication for other medical problems: --Albuterol  108 mcg/ACT 2 puffs every 4 hours as needed for wheezing or shortness of breath --Atenolol  tablet 50 mg p.o. daily for high blood pressure --Avapro  tablet 75 mg p.o. daily for high blood pressure --Synthroid  tablet 100 mcg p.o. daily for hypothyroidism administered at 6:00 in the morning   Other PRN Medications  -Acetaminophen  650 mg every 6 as needed/mild pain  -Maalox 30 mL oral every 4 as needed/digestion  -Magnesium  hydroxide 30 mL daily as needed/mild constipation   --The risks/benefits/side-effects/alternatives to this medication were discussed in detail with the patient and  time was given for questions. The patient consents to medication trial.   -- Metabolic profile and EKG monitoring  obtained while on an atypical antipsychotic (BMI: Lipid Panel: HbgA1c: QTc:)   -- Encouraged patient to participate in unit milieu and in scheduled group therapies   Safety and Monitoring:  Voluntary admission to inpatient psychiatric unit for safety, stabilization and treatment  Daily contact with patient to assess and evaluate symptoms and progress in treatment  Patient's case to be discussed in multi-disciplinary team meeting  Observation Level : q15 minute checks  Vital signs: q12 hours  Precautions: suicide, but pt currently verbally contracts for safety on unit?   Discharge Planning:  Social work and case management to assist with discharge planning and identification of hospital follow-up needs prior to discharge  Estimated LOS: 5-7?days  Discharge Concerns: Need to establish Safety plan; Medication compliance and effectiveness  Discharge Goals: Return home with outpatient referrals for mental health follow-up including medication management/psychotherapy.    Long Term Goal(s): Improvement in symptoms so as ready for discharge  Short Term Goals: Ability to identify changes in lifestyle to reduce recurrence of condition will improve, Ability to verbalize feelings will improve, Ability to disclose and discuss suicidal ideas, Ability to demonstrate self-control will improve, Ability to identify and develop effective coping behaviors will improve, Ability to maintain clinical measurements within normal limits will improve, Compliance with prescribed medications will improve, and Ability to identify triggers associated with substance abuse/mental health issues will improve  Physician Treatment Plan for Secondary Diagnosis: Principal Problem:   Psychosis (HCC)  I certify that inpatient services furnished can reasonably be expected to improve the patient's condition.   Kamalani Mastro C Shevonne Wolf, FNP 05/22/2024, 4:49 PM

## 2024-05-22 NOTE — Progress Notes (Signed)
 Pt in dayroom; calm and composed.

## 2024-05-22 NOTE — Progress Notes (Signed)
Pt asleep in her bed 

## 2024-05-22 NOTE — Progress Notes (Signed)
 Nursing 1:1 note- Patient noted to be irritable, hyper verbal, argumentative and speaking loudly. Zyprexa  5 mg SL administered for increasing agitation. Pt refused offered Vistaril  25 mg. 1: 1 continues as ordered. Safety maintained.

## 2024-05-22 NOTE — Progress Notes (Signed)
 Nursing 1:1 Note- Pt resting quietly in bed at this time . Ativan  2 mg po administered at 1207 for increasing agitation with effective result. 1:1 observation by staff continues. Safety maintained.

## 2024-05-22 NOTE — Progress Notes (Signed)
 Pt attended rec group and was loud and talking over her peers. Pt currently in dayroom with 2 other peers watching Finding Nemo.

## 2024-05-22 NOTE — Progress Notes (Signed)
 Pt refusing lunch options of lasagna or pulled pork - states she is allergic to cheese and doesn't eat pork per her religion. I gave her some pound cake and she yelled at me and said she can't eat that because it has wheat. When asked again, this time in front of [place], RN, pt declined PBJ b/c I'm allergic to peanut butter and also denied a salad b/c I get instant diarrhea.

## 2024-05-22 NOTE — Progress Notes (Signed)
(  Sleep Hours) -4.75  (Any PRNs that were needed, meds refused, or side effects to meds)- Pt refused HS medication , pt encouraged to talk to doctor  (Any disturbances and when (visitation, over night)- Pt continues to be on 1:1 for intrusive behaviors. Pt continue to be hyper-verbal and intrusive with no insight into Tx.   (Concerns raised by the patient)- Pt stated she does not take medication. Pt encouraged to talk to the doctor about her medications.   (SI/HI/AVH)- denies

## 2024-05-22 NOTE — Progress Notes (Signed)
 Pt asleep in her bed. I am present at bedside.

## 2024-05-22 NOTE — BHH Counselor (Signed)
 Adult Comprehensive Assessment  Patient ID: [name], female   DOB: [birth year], 58 y.o.   MRN: [id number]   First attempt:  In the morning, patient exhibited a disorganized though process.  Later, patient received an Ativan  injection and was asleep.  Patient was unable to participate in an assessment today.   Elsie Sakuma O Kavin Weckwerth, LCSWA  05/22/2024

## 2024-05-22 NOTE — Progress Notes (Signed)
 Pt took paper scrub top and pants off and wrapped up in a thin white sheet again. Pt needing frequent redirection to not come past her doorway as other pt's could see her. Pt responds well to redirection; just needs it constantly.

## 2024-05-22 NOTE — Progress Notes (Signed)
 Pt laying in bed, calm and composed.

## 2024-05-22 NOTE — Progress Notes (Signed)
 The patient was found in the wrong bedroom (Room 507) and was carrying out a conversation with one of her peers. She did not respond to verbal redirection and was encouraged to leave the bedroom on several occasions before finally complying. The nurse has been notified.

## 2024-05-22 NOTE — Telephone Encounter (Signed)
 Dr Glennon placed order for mmg for patient last year & it is now showing expired. I called patient to see if she has had her mmg or if she plans to go. If she plans to make appt we will need to update her screening mmg order. Left message for her to callback.

## 2024-05-22 NOTE — BH IP Treatment Plan (Signed)
 Interdisciplinary Treatment and Diagnostic Plan Update  05/22/2024 Time of Session: 11:10 AM [name] MRN: [id number]  Principal Diagnosis: Psychosis [facility])  Secondary Diagnoses: Principal Problem:   Psychosis (HCC)   Current Medications:  Current Facility-Administered Medications  Medication Dose Route Frequency Provider Last Rate Last Admin   acetaminophen  (TYLENOL ) tablet 650 mg  650 mg Oral Q6H PRN Randall Starlyn HERO, NP       alum & mag hydroxide-simeth (MAALOX/MYLANTA) 200-200-20 MG/5ML suspension 30 mL  30 mL Oral Q4H PRN Randall, Veronique M, NP       hydrOXYzine  (ATARAX ) tablet 25 mg  25 mg Oral TID PRN Randall Starlyn HERO, NP       LORazepam  (ATIVAN ) tablet 2 mg  2 mg Oral Q8H PRN Prentis Kitchens A, DO   2 mg at 05/22/24 1207   magnesium  hydroxide (MILK OF MAGNESIA) suspension 30 mL  30 mL Oral Daily PRN Randall Starlyn HERO, NP       OLANZapine  (ZYPREXA ) tablet 5 mg  5 mg Oral QHS Randall, Veronique M, NP       OLANZapine  zydis (ZYPREXA ) disintegrating tablet 5 mg  5 mg Oral TID PRN Byungura, Veronique M, NP       OLANZapine  zydis (ZYPREXA ) disintegrating tablet 5 mg  5 mg Oral TID PRN Randall Starlyn HERO, NP   5 mg at 05/22/24 0950   traZODone  (DESYREL ) tablet 50 mg  50 mg Oral QHS PRN Byungura, Veronique M, NP       PTA Medications: Medications Prior to Admission  Medication Sig Dispense Refill Last Dose/Taking   albuterol  (VENTOLIN  HFA) 108 (90 Base) MCG/ACT inhaler Inhale 2 puffs into the lungs every 4 (four) hours as needed for wheezing or shortness of breath. 18 g 5 Taking As Needed   atenolol  (TENORMIN ) 100 MG tablet TAKE 1 TABLET(100 MG) BY MOUTH TWICE DAILY 180 tablet 3 Taking   clonazePAM  (KLONOPIN ) 0.5 MG tablet Take 0.5 tablets (0.25 mg total) by mouth daily. 15 tablet 1 Taking   Digestive Enzymes (ENZYME DIGEST PO) Take by mouth.   Taking   fexofenadine  (ALLEGRA ) 60 MG tablet Take 120 mg by mouth 2 (two) times daily.   Taking   irbesartan  (AVAPRO )  75 MG tablet TAKE 1 TABLET(75 MG) BY MOUTH DAILY 90 tablet 3 Taking   LANTUS  SOLOSTAR 100 UNIT/ML Solostar Pen Inject 26 Units into the skin daily. 45 mL 1 Taking   levothyroxine  (SYNTHROID ) 100 MCG tablet TAKE 1 TABLET(100 MCG) BY MOUTH DAILY 90 tablet 3 Taking   methocarbamol  (ROBAXIN ) 500 MG tablet Take 500 mg by mouth every 8 (eight) hours as needed for muscle spasms.   Taking As Needed   NOVOLOG  FLEXPEN 100 UNIT/ML FlexPen Inject 10-30 Units into the skin 3 (three) times daily with meals. 90 mL 2 Taking   Olopatadine HCl (PATADAY OP) Apply to eye.   Taking   OVER THE COUNTER MEDICATION 1 capsule daily. Probiotic renew life    Taking   sertraline  (ZOLOFT ) 100 MG tablet TAKE 2 TABLETS BY MOUTH EVERY DAY 60 tablet 1 Taking   Blood Glucose Monitoring Suppl (ONE TOUCH ULTRA MINI) w/Device KIT Use to check blood sugar 2 times a day. 1 kit 0    Blood Glucose Monitoring Suppl (ONETOUCH VERIO) w/Device KIT Use as instructed to check blood sugar 4X daily 1 kit 0    Continuous Glucose Sensor (FREESTYLE LIBRE 3 PLUS SENSOR) MISC Inject 1 Device into the skin continuous. Change every 15 days 6 each 1  glucose blood (ONETOUCH VERIO) test strip Use as instructed to check blood sugar 4X daily 300 each 2    Insulin  Pen Needle (BD PEN NEEDLE NANO 2ND GEN) 32G X 4 MM MISC USE 4 TIMES DAILY 400 each 3     Patient Stressors: Other: God Brought me here    Patient Strengths: Religious Affiliation   Treatment Modalities: Medication Management, Group therapy, Case management,  1 to 1 session with clinician, Psychoeducation, Recreational therapy.   Physician Treatment Plan for Primary Diagnosis: Psychosis (HCC) Long Term Goal(s):     Short Term Goals:    Medication Management: Evaluate patient's response, side effects, and tolerance of medication regimen.  Therapeutic Interventions: 1 to 1 sessions, Unit Group sessions and Medication administration.  Evaluation of Outcomes: Not  Progressing  Physician Treatment Plan for Secondary Diagnosis: Principal Problem:   Psychosis (HCC)  Long Term Goal(s):     Short Term Goals:       Medication Management: Evaluate patient's response, side effects, and tolerance of medication regimen.  Therapeutic Interventions: 1 to 1 sessions, Unit Group sessions and Medication administration.  Evaluation of Outcomes: Not Progressing   RN Treatment Plan for Primary Diagnosis: Psychosis (HCC) Long Term Goal(s): Knowledge of disease and therapeutic regimen to maintain health will improve  Short Term Goals: Ability to remain free from injury will improve, Ability to verbalize frustration and anger appropriately will improve, Ability to demonstrate self-control, Ability to participate in decision making will improve, Ability to verbalize feelings will improve, Ability to disclose and discuss suicidal ideas, Ability to identify and develop effective coping behaviors will improve, and Compliance with prescribed medications will improve  Medication Management: RN will administer medications as ordered by provider, will assess and evaluate patient's response and provide education to patient for prescribed medication. RN will report any adverse and/or side effects to prescribing provider.  Therapeutic Interventions: 1 on 1 counseling sessions, Psychoeducation, Medication administration, Evaluate responses to treatment, Monitor vital signs and CBGs as ordered, Perform/monitor CIWA, COWS, AIMS and Fall Risk screenings as ordered, Perform wound care treatments as ordered.  Evaluation of Outcomes: Not Progressing   [name] Treatment Plan for Primary Diagnosis: Psychosis (HCC) Long Term Goal(s): Safe transition to appropriate next level of care at discharge, Engage patient in therapeutic group addressing interpersonal concerns.  Short Term Goals: Engage patient in aftercare planning with referrals and resources, Increase social support, Increase  ability to appropriately verbalize feelings, Increase emotional regulation, Facilitate acceptance of mental health diagnosis and concerns, Facilitate patient progression through stages of change regarding substance use diagnoses and concerns, Identify triggers associated with mental health/substance abuse issues, and Increase skills for wellness and recovery  Therapeutic Interventions: Assess for all discharge needs, 1 to 1 time with Social worker, Explore available resources and support systems, Assess for adequacy in community support network, Educate family and significant other(s) on suicide prevention, Complete Psychosocial Assessment, Interpersonal group therapy.  Evaluation of Outcomes: Not Progressing   Progress in Treatment: Attending groups: No. Participating in groups: No. Taking medication as prescribed: Yes. Toleration medication: Yes. Family/Significant other contact made: consents are pending Patient understands diagnosis: No. Discussing patient identified problems/goals with staff: No. Medical problems stabilized or resolved: Yes. Denies suicidal/homicidal ideation: Yes. Issues/concerns per patient self-inventory: No.  New problem(s) identified:  No  New Short Term/Long Term Goal(s):    medication stabilization, elimination of SI thoughts, development of comprehensive mental wellness plan.    Patient Goals: I feel like I was forced to come here, and I  didn't come by choice.   Discharge Plan or Barriers:  Patient recently admitted. CSW will continue to follow and assess for appropriate referrals and possible discharge planning.    Reason for Continuation of Hospitalization: Delusions  Suicidal ideation Paranoia  Estimated Length of Stay:  5 - 7 days  Last 3 [place] Suicide Severity Risk Score: Flowsheet Row Ad[facility] (Current[facility]20/2024 in [facility] at [facility] Office Visit from 11/28/2023 in [facility] at [facility]  C-SSRS RISK CATEGORY No Risk No Risk No Risk    Last PHQ 2/9 Scores:    08/17/2022   10:41 AM 08/17/2022   10:35 AM 01/08/2022    2:06 PM  Depression screen PHQ 2/9  Decreased Interest 0 0 0  Down, Depressed, Hopeless 1 1 0  PHQ - 2 Score 1 1 0  Altered sleeping 1    Tired, decreased energy 1    Change in appetite 0    Feeling bad or failure about yourself  0    Trouble concentrating 1    Moving slowly or fidgety/restless 0    Suicidal thoughts 0    PHQ-9 Score 4    Difficult doing work/chores Somewhat difficult      Scribe for Treatment Team: [name], LCSWA 05/22/2024 5:51 PM

## 2024-05-22 NOTE — Progress Notes (Signed)
 Nursing Note 1:1- Pt sitting in her room  at this time. No distress noted or complaints made. Pt continues to be hyper verbal, staff able to redirect pt. 1:1 in progress. Safety maintained.

## 2024-05-22 NOTE — Progress Notes (Signed)
   05/22/24 1020  Psych Admission Type (Psych Patients Only)  Admission Status Involuntary  Psychosocial Assessment  Patient Complaints Anxiety;Irritability;Hyperactivity  Eye Contact Fair  Facial Expression Anxious  Affect Preoccupied;Anxious  Speech Rapid  Interaction Intrusive;Assertive  Motor Activity Fidgety;Hyperactive  Appearance/Hygiene Unremarkable  Behavior Characteristics Anxious;Fidgety;Hyperactive;Intrusive;Impulsive  Mood Anxious;Irritable;Preoccupied  Aggressive Behavior  Effect No apparent injury  Thought Process  Coherency Disorganized  Content Delusions  Delusions Religious  Perception Hallucinations (Pt observed responding to internal stimuli.)  Hallucination Auditory  Judgment Impaired  Confusion Mild  Danger to Self  Current suicidal ideation? Denies  Danger to Others  Danger to Others None reported or observed

## 2024-05-22 NOTE — Plan of Care (Signed)
  Problem: Activity: Goal: Interest or engagement in activities will improve Outcome: Progressing Goal: Sleeping patterns will improve Outcome: Progressing   Problem: Coping: Goal: Ability to verbalize frustrations and anger appropriately will improve Outcome: Progressing Goal: Ability to demonstrate self-control will improve Outcome: Progressing   Problem: Safety: Goal: Periods of time without injury will increase Outcome: Progressing

## 2024-05-22 NOTE — Progress Notes (Signed)
 Pt back in bed, attempting to nap.

## 2024-05-22 NOTE — Progress Notes (Signed)
 Pt is awake and sitting on edge of bed. She keeps repeating that she needs her cell phone and rings out of her locker.

## 2024-05-22 NOTE — H&P (Signed)
 Psychiatric Admission Assessment Adult  Patient Identification: [name] MRN:  [id number] Date of Evaluation:  05/22/2024 Chief Complaint:  Psychosis [facility]) [F29] Principal Diagnosis: Psychosis (HCC) Diagnosis:  Principal Problem:   Psychosis (HCC)  CC:  Agitated, beligerent, uncooperative, delusional, manic, paranoid behavior.    History of Present Illness: This is the first inpatient psychiatric admission for [name] a 58 year old Caucasian female with prior psychiatric diagnoses significant for psychosis, PTSD, severe recurrent MDD, and anxiety, who presents involuntarily to [facility] from [facility] ED at [facility] for acute psychosis, agitation, belligerent behavior, delusional behavior, paranoid behavior and manic episode.  BAL less than 15, UDS negative for any substances.  As per IVC paperwork:  Patient presents with IVC that states that patient has not been taking prescribed medication nor eating or sleeping.  IVC states that patient has been knocking on her neighbors doors speaking incoherently and allowing unknown people in her home stating that they will help me save the world.  These behaviors have been worsening over past month per brother. When [employer] attempted to get patient from home, she became aggressive and bit one of the [employer] officers so was placed in handcuffs and arrived to hospital in handcuffs.   During this admission assessment, patient presents disorganized, uncooperative, delusional, agitated, and floridly manic. Unable to provide meaningful reason for being brought to the hospital.  When asked what brought her to the hospital states, I was having fun time at my backyard with my dog and the police came and brought me to the hospital.  Patient was able to confirm her name and birthday and orientation questions but will not answer any additional questions.  She reports that she is not supposed to be here, further and that she  knows the law, and she will contact our lawyers.  Objective: Patient presents alert, awake and oriented to person, time, place, and partially situation. She is casually dressed and groomed.  Floridly manic, talkative, labile, and intrusive, but pleasant. Patient is incoherent with disorganized thought process. Patient states I was having the best time I can, and the police dragged me to the hospital. Patient has good eye contact but walking up and down in the room. She does admits to hx of PTSD and depression/anxiety. She reports a hx of taking medications including Klonopin  and Atenolol . She reports feeling healthy, that her medical condition is stabilized. Speech is pressured. Patient reports there is nothing wrong with her, that she is enjoying her life. Patient denies suicidal/homicidal ideations or AVH. She denies hearing voices and does not appear to be responding to internal stimuli.  Vital signs reviewed with elevated pulse of 105, and blood pressure 155/97.  Home blood pressure medications resumed.  Labs and EKG reviewed as indicated in the treatments plan.  Patient is admitted for safety, medication management, and stabilization.  Mode of transport to Hospital: Save transport Current Outpatient (Home) Medication List: See home medication listing PRN medication prior to evaluation:See home medication listing  ED course: Labs and EKG were obtained and analyzed Collateral Information: Not obtained at this time POA/Legal Guardian: Patient is her own legal guardian  Past Psychiatric Hx: Previous Psych Diagnoses: Psychosis, PTSD, Severe recurrent MDD and anxiety Prior inpatient treatment: Denies Current/prior outpatient treatment: Unable to determine Prior rehab hx: Unable to determine Psychotherapy hx: Yes History of suicide: Denies History of homicide or aggression: Denies Psychiatric medication history: Unable to determine Psychiatric medication compliance history: As per chart review  noncompliance  with meds Neuromodulation history: Denies Current Psychiatrist: Unable to determine Current therapist: Unable to determine  Substance Abuse Hx: Alcohol: Denies Tobacco: Denies Illicit drugs: Denies Rx drug abuse: Denies Rehab hx: Denies  Past Medical History: Medical Diagnoses: GERD, asthma, diabetes, migraine, hypertension, hypothyroid, and anemia Home Rx: Yes Prior Hosp: Denies Prior Surgeries/Trauma: Denies Head trauma, LOC, concussions, seizures: Denies Allergies: Allergies    Latex  Drug Ingredient Itching, Swelling High Allergy  07/27/2011 Past Updates    Fluticasone   Drug Ingredient  Not Specified  01/07/2018 Past Updates  Nose bleeds  Pravastatin   Drug Ingredient  Not Specified  04/21/2019 Past Updates  myalgias  Adverse Reactions/Drug Intolerances    Metformin  And Related  Drug Class  Not Specified Intolerance 04/21/2019 Past Updates   LMP: Denies Contraception: Denies PCP: Yes, however, does not remember the name of the doctor  Family History: Medical: Patient unsure Psych: Uncle committed suicide.  Mother has history of bipolar disorder Psych Rx: Unsure SA/HA: Uncle Committed suicide Substance use family hx: Patient unsure  Social History: Childhood (bring, raised, lives now, parents, siblings, schooling, education): Bachelor degree in journalism Abuse: Denies history of abuse Marital Status: Single Sexual orientation: Female from birth Children: No children Employment: Unemployed Peer Group: Denies peer group Housing: Current housing Finances: Some Water quality scientist: Denies Special educational needs teacher: Denies affiliation with the [employer]  Associated Signs/Symptoms: Depression Symptoms:  difficulty concentrating, anxiety, disturbed sleep, (Hypo) Manic Symptoms:  Delusions, Distractibility, Elevated Mood, Flight of Ideas, Grandiosity, Impulsivity, Irritable Mood, Labiality of Mood, Anxiety Symptoms:  Excessive Worry, Panic  Symptoms, Psychotic Symptoms:  Not applicable PTSD Symptoms: NA Total Time spent with patient: 1.5 hours  Is the patient at risk to self? Yes.    Has the patient been a risk to self in the past 6 months? Yes.    Has the patient been a risk to self within the distant past? Yes.    Is the patient a risk to others? Yes.    Has the patient been a risk to others in the past 6 months? Yes.    Has the patient been a risk to others within the distant past? No.   [place] Scale:  Flowsheet Row [facility]) [facility]20/2024 in [facility] Department at [facility] Office Visit from 11/28/2023 in [facility] at [facility]  C-SSRS RISK CATEGORY No Risk No Risk No Risk   Alcohol Screening: 1. How often do you have a drink containing alcohol?: Never 2. How many drinks containing alcohol do you have on a typical day when you are drinking?: 1 or 2 3. How often do you have six or more drinks on one occasion?: Never AUDIT-C Score: 0 4. How often during the last year have you found that you were not able to stop drinking once you had started?: Never 5. How often during the last year have you failed to do what was normally expected from you because of drinking?: Never 6. How often during the last year have you needed a first drink in the morning to get yourself going after a heavy drinking session?: Never 7. How often during the last year have you had a feeling of guilt of remorse after drinking?: Never 8. How often during the last year have you been unable to remember what happened the night before because you had been drinking?: Never 9. Have you or someone else been injured as a result  of your drinking?: No 10. Has a relative or friend or a doctor or another health worker been concerned about your drinking or suggested you cut down?: No Alcohol Use Disorder Identification Test  Final Score (AUDIT): 0 Alcohol Brief Interventions/Follow-up: Alcohol education/Brief advice Substance Abuse History in the last 12 months:  No. Consequences of Substance Abuse: NA Previous Psychotropic Medications: Yes  Psychological Evaluations: Yes  Past Medical History:  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   PTSD (post-traumatic stress disorder)    Seasonal allergies    Thyroid  disease     Past Surgical History:  Procedure Laterality Date   CHOLECYSTECTOMY     GALLBLADDER SURGERY     Family History:  Family History  Problem Relation Age of Onset   Bipolar disorder Mother    Hypertension Mother    Diabetes Mother    Heart disease Mother        CAD, pacemaker   Alzheimer's disease Father    Hypertension Father    Alzheimer's disease Maternal Grandmother    Heart disease Paternal Grandfather    Tobacco Screening:  Social History   Tobacco Use  Smoking Status Never  Smokeless Tobacco Never    BH Tobacco Counseling     Are you interested in Tobacco Cessation Medications?  No value filed. Counseled patient on smoking cessation:  No value filed. Reason Tobacco Screening Not Completed: No value filed.    Social History:  Social History   Substance and Sexual Activity  Alcohol Use Yes   Alcohol/week: 1.0 standard drink of alcohol   Types: 1 Standard drinks or equivalent per week   Comment: 1 drink a month     Social History   Substance and Sexual Activity  Drug Use No    Additional Social History:  Allergies:   Allergies  Allergen Reactions   Latex Itching and Swelling   Fluticasone      Nose bleeds   Metformin  And Related     GI side  effects   Pravastatin      myalgias   Lab Results:  Results for orders placed or performed during the hospital encounter of 05/20/24 (from the past 48 hours)  Rapid urine drug screen (hospital performed)     Status: None   Collection Time: 05/20/24  6:03 PM  Result Value Ref Range   Opiates NEGATIVE NEGATIVE   Cocaine NEGATIVE NEGATIVE   Benzodiazepines NEGATIVE NEGATIVE   Amphetamines NEGATIVE NEGATIVE   Tetrahydrocannabinol NEGATIVE NEGATIVE   Barbiturates NEGATIVE NEGATIVE   Methadone Scn, Ur NEGATIVE NEGATIVE   Fentanyl NEGATIVE NEGATIVE    Comment: (NOTE) Drug screen is for Medical Purposes only. Positive results are preliminary only. If confirmation is needed, notify lab within 5 days.  Drug Class                 Cutoff (ng/mL) Amphetamine and metabolites 1000 Barbiturate and metabolites 200 Benzodiazepine              200 Opiates and metabolites     300 Cocaine and metabolites     300 THC  50 Fentanyl                    5 Methadone                   300  Trazodone  is metabolized in vivo to several metabolites,  including pharmacologically active m-CPP, which is excreted in the  urine.  Immunoassay screens for amphetamines and MDMA have potential  cross-reactivity with these compounds and may provide false positive  result.  Performed at [facility], [address], [place], [place] [address]   Comprehensive metabolic panel     Status: Abnormal   Collection Time: 05/20/24  7:59 PM  Result Value Ref Range   Sodium 141 135 - 145 mmol/L   Potassium 2.8 (L) 3.5 - 5.1 mmol/L   Chloride 97 (L) 98 - 111 mmol/L   CO2 15 (L) 22 - 32 mmol/L   Glucose, Bld 147 (H) 70 - 99 mg/dL    Comment: Glucose reference range applies only to samples taken after fasting for at least 8 hours.   BUN 8 6 - 20 mg/dL   Creatinine, Ser 8.93 (H) 0.44 - 1.00 mg/dL   Calcium  9.8 8.9 - 10.3 mg/dL   Total Protein 6.9 6.5 - 8.1 g/dL   Albumin 3.9 3.5  - 5.0 g/dL   AST 37 15 - 41 U/L   ALT 32 0 - 44 U/L   Alkaline Phosphatase 75 38 - 126 U/L   Total Bilirubin 0.4 0.0 - 1.2 mg/dL   GFR, Estimated >39 >39 mL/min    Comment: (NOTE) Calculated using the CKD-EPI Creatinine Equation (2021)    Anion gap 29 (H) 5 - 15    Comment: Performed at [facility], [address], [place], [place] [address]  Ethanol     Status: None   Collection Time: 05/20/24  7:59 PM  Result Value Ref Range   Alcohol, Ethyl (B) <15 <15 mg/dL    Comment: (NOTE) For medical purposes only. Performed at [facility], [address], [place], [place] [address]   CBC with Differential     Status: Abnormal   Collection Time: 05/20/24  7:59 PM  Result Value Ref Range   WBC 9.3 4.0 - 10.5 K/uL   RBC 3.98 3.87 - 5.11 MIL/uL   Hemoglobin 11.1 (L) 12.0 - 15.0 g/dL   HCT 64.5 (L) 63.9 - 53.9 %   MCV 88.9 80.0 - 100.0 fL   MCH 27.9 26.0 - 34.0 pg   MCHC 31.4 30.0 - 36.0 g/dL   RDW 85.1 88.4 - 84.4 %   Platelets 177 150 - 400 K/uL   nRBC 0.0 0.0 - 0.2 %   Neutrophils Relative % 85 %   Neutro Abs 7.9 (H) 1.7 - 7.7 K/uL   Lymphocytes Relative 8 %   Lymphs Abs 0.7 0.7 - 4.0 K/uL   Monocytes Relative 6 %   Monocytes Absolute 0.5 0.1 - 1.0 K/uL   Eosinophils Relative 1 %   Eosinophils Absolute 0.1 0.0 - 0.5 K/uL   Basophils Relative 0 %   Basophils Absolute 0.0 0.0 - 0.1 K/uL   Immature Granulocytes 0 %   Abs Immature Granulocytes 0.04 0.00 - 0.07 K/uL    Comment: Performed at [facility], [address] [place], [place] [address]  Acetaminophen  level     Status: Abnormal   Collection Time: 05/20/24  7:59 PM  Result Value Ref Range  Acetaminophen  (Tylenol ), Serum <10 (L) 10 - 30 ug/mL    Comment: (NOTE) Toxic concentrations can be more effectively related to post dose interval; > 200, > 100, and > 50 ug/mL serum concentrations correspond to toxic concentrations at 4, 8, and 12 hours post  dose, respectively.  Performed at [facility], [address], [place], [place] [address]   Salicylate level     Status: Abnormal   Collection Time: 05/20/24  7:59 PM  Result Value Ref Range   Salicylate Lvl <7.0 (L) 7.0 - 30.0 mg/dL    Comment: Performed at [facility], [address], [place], [place] [address]  POC CBG, ED     Status: Abnormal   Collection Time: 05/20/24  8:14 PM  Result Value Ref Range   Glucose-Capillary 120 (H) 70 - 99 mg/dL    Comment: Glucose reference range applies only to samples taken after fasting for at least 8 hours.  Pregnancy, urine     Status: None   Collection Time: 05/20/24 11:44 PM  Result Value Ref Range   Preg Test, Ur NEGATIVE NEGATIVE    Comment:        THE SENSITIVITY OF THIS METHODOLOGY IS >20 mIU/mL. Performed at [facility], [address], [place], [place] [address]   Lactic acid, plasma     Status: None   Collection Time: 05/21/24 12:10 AM  Result Value Ref Range   Lactic Acid, Venous 0.6 0.5 - 1.9 mmol/L    Comment: Performed at [facility], [address], [place], [place] [address]  Blood gas, venous (at [facility])     Status: Abnormal   Collection Time: 05/21/24 12:10 AM  Result Value Ref Range   pH, Ven 7.3 7.25 - 7.43   pCO2, Ven 41 (L) 44 - 60 mmHg   pO2, Ven 35 32 - 45 mmHg   Bicarbonate 20.2 20.0 - 28.0 mmol/L   Acid-base deficit 5.8 (H) 0.0 - 2.0 mmol/L   O2 Saturation 55 %   Patient temperature 37.0     Comment: Performed at [facility], [address] [place], [place] [address]  Beta-hydroxybutyric acid     Status: Abnormal   Collection Time: 05/21/24 12:10 AM  Result Value Ref Range   Beta-Hydroxybutyric Acid >8.00 (H) 0.05 - 0.27 mmol/L    Comment: RESULT CONFIRMED BY MANUAL DILUTION Performed at [facility], [address] [place], [place] [address]   CK     Status: Abnormal   Collection Time:  05/21/24 12:10 AM  Result Value Ref Range   Total CK 310 (H) 38 - 234 U/L    Comment: Performed at [facility], [address], [place], [place] [address]  Basic metabolic panel     Status: Abnormal   Collection Time: 05/21/24 12:10 AM  Result Value Ref Range   Sodium 141 135 - 145 mmol/L   Potassium 3.5 3.5 - 5.1 mmol/L    Comment: Delta check noted    Chloride 101 98 - 111 mmol/L   CO2 17 (L) 22 - 32 mmol/L   Glucose, Bld 133 (H) 70 - 99 mg/dL    Comment: Glucose reference range applies only to samples taken after fasting for at least 8 hours.   BUN 7 6 - 20 mg/dL   Creatinine, Ser 9.02 0.44 - 1.00 mg/dL   Calcium  8.9 8.9 - 10.3 mg/dL   GFR, Estimated >39 >39 mL/min  Comment: (NOTE) Calculated using the CKD-EPI Creatinine Equation (2021)    Anion gap 24 (H) 5 - 15    Comment: Performed at [facility], [address] [place], [place] [address]   Blood Alcohol level:  Lab Results  Component Value Date   [facility] <15 05/20/2024   Metabolic Disorder Labs:  Lab Results  Component Value Date   HGBA1C 6.4 (A) 04/03/2024   MPG 157 (H) 02/26/2013   MPG 166 (H) 08/07/2012   No results found for: PROLACTIN Lab Results  Component Value Date   CHOL 269 (H) 05/05/2024   TRIG 82 05/05/2024   HDL 99 05/05/2024   CHOLHDL 2.7 05/05/2024   VLDL 81.5 07/26/2023   LDLCALC 157 (H) 05/05/2024   LDLCALC 160 (H) 07/26/2023   Current Medications: Current Facility-Administered Medications  Medication Dose Route Frequency Provider Last Rate Last Admin   acetaminophen  (TYLENOL ) tablet 650 mg  650 mg Oral Q6H PRN Randall Starlyn HERO, NP       alum & mag hydroxide-simeth (MAALOX/MYLANTA) 200-200-20 MG/5ML suspension 30 mL  30 mL Oral Q4H PRN Randall, Veronique M, NP       hydrOXYzine  (ATARAX ) tablet 25 mg  25 mg Oral TID PRN Randall Starlyn HERO, NP       LORazepam  (ATIVAN ) tablet 2 mg  2 mg Oral Q8H PRN Prentis Kitchens A, DO   2 mg at 05/22/24 1207    magnesium  hydroxide (MILK OF MAGNESIA) suspension 30 mL  30 mL Oral Daily PRN Randall Starlyn HERO, NP       OLANZapine  (ZYPREXA ) tablet 5 mg  5 mg Oral QHS Randall, Veronique M, NP       OLANZapine  zydis (ZYPREXA ) disintegrating tablet 5 mg  5 mg Oral TID PRN Byungura, Veronique M, NP       OLANZapine  zydis (ZYPREXA ) disintegrating tablet 5 mg  5 mg Oral TID PRN Randall Starlyn HERO, NP   5 mg at 05/22/24 0950   traZODone  (DESYREL ) tablet 50 mg  50 mg Oral QHS PRN Byungura, Veronique M, NP       PTA Medications: Medications Prior to Admission  Medication Sig Dispense Refill Last Dose/Taking   albuterol  (VENTOLIN  HFA) 108 (90 Base) MCG/ACT inhaler Inhale 2 puffs into the lungs every 4 (four) hours as needed for wheezing or shortness of breath. 18 g 5 Taking As Needed   atenolol  (TENORMIN ) 100 MG tablet TAKE 1 TABLET(100 MG) BY MOUTH TWICE DAILY 180 tablet 3 Taking   clonazePAM  (KLONOPIN ) 0.5 MG tablet Take 0.5 tablets (0.25 mg total) by mouth daily. 15 tablet 1 Taking   Digestive Enzymes (ENZYME DIGEST PO) Take by mouth.   Taking   fexofenadine  (ALLEGRA ) 60 MG tablet Take 120 mg by mouth 2 (two) times daily.   Taking   irbesartan  (AVAPRO ) 75 MG tablet TAKE 1 TABLET(75 MG) BY MOUTH DAILY 90 tablet 3 Taking   LANTUS  SOLOSTAR 100 UNIT/ML Solostar Pen Inject 26 Units into the skin daily. 45 mL 1 Taking   levothyroxine  (SYNTHROID ) 100 MCG tablet TAKE 1 TABLET(100 MCG) BY MOUTH DAILY 90 tablet 3 Taking   methocarbamol  (ROBAXIN ) 500 MG tablet Take 500 mg by mouth every 8 (eight) hours as needed for muscle spasms.   Taking As Needed   NOVOLOG  FLEXPEN 100 UNIT/ML FlexPen Inject 10-30 Units into the skin 3 (three) times daily with meals. 90 mL 2 Taking   Olopatadine HCl (PATADAY OP) Apply to eye.   Taking   OVER THE COUNTER MEDICATION  1 capsule daily. Probiotic renew life    Taking   sertraline  (ZOLOFT ) 100 MG tablet TAKE 2 TABLETS BY MOUTH EVERY DAY 60 tablet 1 Taking   Blood Glucose Monitoring  Suppl (ONE TOUCH ULTRA MINI) w/Device KIT Use to check blood sugar 2 times a day. 1 kit 0    Blood Glucose Monitoring Suppl (ONETOUCH VERIO) w/Device KIT Use as instructed to check blood sugar 4X daily 1 kit 0    Continuous Glucose Sensor (FREESTYLE LIBRE 3 PLUS SENSOR) MISC Inject 1 Device into the skin continuous. Change every 15 days 6 each 1    glucose blood (ONETOUCH VERIO) test strip Use as instructed to check blood sugar 4X daily 300 each 2    Insulin  Pen Needle (BD PEN NEEDLE NANO 2ND GEN) 32G X 4 MM MISC USE 4 TIMES DAILY 400 each 3    AIMS:  ,  ,  ,  ,  ,  ,    Musculoskeletal: Strength & Muscle Tone: within normal limits Gait & Station: normal Patient leans: N/A  Psychiatric Specialty Exam:  Presentation  General Appearance:  Appropriate for Environment; Casual  Eye Contact: Good  Speech:No data recorded Speech Volume: Normal  Handedness: Right  Mood and Affect  Mood: Euphoric  Affect: Congruent  Thought Process  Thought Processes: Disorganized; Irrevelant; Linear  Duration of Psychotic Symptoms:N/A Past Diagnosis of Schizophrenia or Psychoactive disorder: None Descriptions of Associations:Loose  Orientation:Full (Time, Place and Person)  Thought Content:Illogical; Scattered  Hallucinations:Hallucinations: None  Ideas of Reference:None  Suicidal Thoughts:Suicidal Thoughts: No  Homicidal Thoughts:Homicidal Thoughts: No  Sensorium  Memory: Immediate Poor; Recent Poor  Judgment: Poor  Insight: Poor  Executive Functions  Concentration: Poor  Attention Span: Poor  Recall: Poor  Fund of Knowledge: Poor  Language: Fair  Psychomotor Activity  Psychomotor Activity: Psychomotor Activity: Increased  Assets  Assets: Physical Health; Resilience  Sleep  Sleep: Sleep: Good  Estimated Sleeping Duration (Last 24 Hours): 5.75-6.50 hours  Physical Exam: Physical Exam Vitals and nursing note reviewed.  Constitutional:       Appearance: She is obese.  HENT:     Head: Normocephalic.     Right Ear: External ear normal.     Left Ear: External ear normal.     Nose: Nose normal.     Mouth/Throat:     Mouth: Mucous membranes are moist.     Pharynx: Oropharynx is clear.  Eyes:     Extraocular Movements: Extraocular movements intact.  Cardiovascular:     Rate and Rhythm: Tachycardia present.  Pulmonary:     Effort: Pulmonary effort is normal. No respiratory distress.  Abdominal:     Comments: Deferred  Genitourinary:    Comments: Deferred Musculoskeletal:        General: Normal range of motion.     Cervical back: Normal range of motion.  Skin:    General: Skin is dry.  Neurological:     Mental Status: She is alert and oriented to person, place, and time.  Psychiatric:     Comments: Floridly manic    Review of Systems  Constitutional:  Negative for chills and fever.  HENT:  Negative for sore throat.   Eyes:  Negative for blurred vision.  Respiratory:  Negative for cough, sputum production, shortness of breath and wheezing.   Cardiovascular:  Negative for chest pain and palpitations.  Gastrointestinal:  Negative for abdominal pain, constipation, diarrhea, heartburn, nausea and vomiting.  Genitourinary:  Negative for dysuria, frequency and urgency.  Musculoskeletal:  Negative for falls and myalgias.  Skin:  Negative for itching and rash.  Neurological:  Negative for dizziness and headaches.  Endo/Heme/Allergies:        See allergy  list  Psychiatric/Behavioral:  Negative for depression, hallucinations, substance abuse and suicidal ideas. The patient is nervous/anxious. The patient does not have insomnia.    Blood pressure (!) 155/97, pulse (!) 105, temperature 98.3 F (36.8 C), temperature source Oral, resp. rate 18, height 5' 3 (1.6 m), weight 80 kg, SpO2 99%. Body mass index is 31.24 kg/m.  Treatment Plan Summary: Daily contact with patient to assess and evaluate symptoms and progress in  treatment and Medication management  Observation Level/Precautions:  15 minute checks  Laboratory:  CBC: Hemoglobin 11.1, HCT 35.4, otherwise normal Chemistry Profile: 217, glucose 133, anion gap 24, otherwise normal Folic Acid: Ordered HbAIC: 6.4 Lipid panel: Cholesterol 269, LDL 157, otherwise normal HCG: Not applicable UDS: Negative for any substances UA: Ordered TSH ordered Vitamin B-12: Ordered Vitamin D 25-hydroxy: Ordered  Psychotherapy: Yes  Medications: See MAR  Consultations: Pending  Discharge Concerns: Safety  Estimated LOS: 3 to 7 days  Other:     Assessment: This is the first inpatient psychiatric admission for [name] a 58 year old Caucasian female with prior psychiatric diagnoses significant for psychosis, PTSD, severe recurrent MDD, and anxiety, who presents involuntarily to [facility] from [facility] ED at [facility] for acute psychosis, agitation, belligerent behavior, delusional behavior, paranoid behavior and manic episode.  BAL less than 15, UDS negative for any substances.  Physician Treatment Plan for Primary Diagnosis: Psychosis (HCC) and anxiety  Plan: Medications: --Continue olanzapine  5 mg p.o. nightly for psychosis --Continue hydroxyzine  tablet 25 mg p.o. 3 times daily as needed for anxiety -- Continue trazodone  tablet 50 mg p.o. q. nightly as needed for insomnia -- Continue Ativan  tablet 2 mg p.o. every 8 hours as needed for anxiety, agitation, second-line after olanzapine   Continue BH Agitation Protocol   Medication for other medical problems: --Albuterol  108 mcg/ACT 2 puffs every 4 hours as needed for wheezing or shortness of breath --Atenolol  tablet 50 mg p.o. daily for high blood pressure --Avapro  tablet 75 mg p.o. daily for high blood pressure --Synthroid  tablet 100 mcg p.o. daily for hypothyroidism administered at 6:00 in the morning   Other PRN Medications  -Acetaminophen  650 mg every 6 as needed/mild  pain  -Maalox 30 mL oral every 4 as needed/digestion  -Magnesium  hydroxide 30 mL daily as needed/mild constipation   --The risks/benefits/side-effects/alternatives to this medication were discussed in detail with the patient and time was given for questions. The patient consents to medication trial.   -- Metabolic profile and EKG monitoring obtained while on an atypical antipsychotic (BMI: Lipid Panel: HbgA1c: QTc:)   -- Encouraged patient to participate in unit milieu and in scheduled group therapies   Safety and Monitoring:  Voluntary admission to inpatient psychiatric unit for safety, stabilization and treatment  Daily contact with patient to assess and evaluate symptoms and progress in treatment  Patient's case to be discussed in multi-disciplinary team meeting  Observation Level : q15 minute checks  Vital signs: q12 hours  Precautions: suicide, but pt currently verbally contracts for safety on unit?   Discharge Planning:  Social work and case management to assist with discharge planning and identification of hospital follow-up needs prior to discharge  Estimated LOS: 5-7?days  Discharge Concerns: Need to establish Safety plan; Medication compliance and effectiveness  Discharge Goals: Return home with outpatient  referrals for mental health follow-up including medication management/psychotherapy.    Long Term Goal(s): Improvement in symptoms so as ready for discharge  Short Term Goals: Ability to identify changes in lifestyle to reduce recurrence of condition will improve, Ability to verbalize feelings will improve, Ability to disclose and discuss suicidal ideas, Ability to demonstrate self-control will improve, Ability to identify and develop effective coping behaviors will improve, Ability to maintain clinical measurements within normal limits will improve, Compliance with prescribed medications will improve, and Ability to identify triggers associated with substance abuse/mental health  issues will improve  Physician Treatment Plan for Secondary Diagnosis: Principal Problem:   Psychosis (HCC)  I certify that inpatient services furnished can reasonably be expected to improve the patient's condition.    Krystofer Hevener C Jonerik Sliker, FNP 9/5/20255:00 PM

## 2024-05-22 NOTE — Group Note (Signed)
 Date:  05/22/2024 Time:  8:52 PM  Group Topic/Focus:  Wrap-Up Group:   The focus of this group is to help patients review their daily goal of treatment and discuss progress on daily workbooks.    Participation Level:  Active  Participation Quality:  Appropriate  Affect:  Appropriate  Cognitive:  Appropriate  Insight: Appropriate  Engagement in Group:  Engaged  Modes of Intervention:  Education and Exploration  Additional Comments:  Patient attended and participated in group tonight.  She reports that her goal today was to get her rings that are locked up.   She was able to get only one.  Gwenn Chillington Dacosta 05/22/2024, 8:52 PM

## 2024-05-22 NOTE — Progress Notes (Signed)
 Pt awake

## 2024-05-22 NOTE — Progress Notes (Signed)
 The patient was found coming out of another patient room. (506) She was directed to her room.  She stated that she did not know where her room was.  This Clinical research associate told her that her name was on her door and that is the only room she should be going into.

## 2024-05-22 NOTE — Plan of Care (Signed)
  Problem: Education: Goal: Emotional status will improve Outcome: Progressing   Problem: Activity: Goal: Interest or engagement in activities will improve Outcome: Progressing   Problem: Safety: Goal: Periods of time without injury will increase Outcome: Progressing

## 2024-05-22 NOTE — Progress Notes (Signed)
 [name] brought clothes from clothing closet and pt is now dressed in a yellow dress, appropriate length. Pt currently cleaning up trash and linens in her room.

## 2024-05-22 NOTE — Progress Notes (Addendum)
 Pt found in another pt's room at approx 0800. Pt denies any physical contact with other pt. Pt reminded of the rules and that going into any other pt's room is not allowed. Pt insisted she is allowed and that the other pt gave her permission to go into the room. Pt again reminded that this is not allowed regardless of being invited by another pt. Pt's nurse Jinnie Jane PEAK  notified. Almarie Lowers, RN charge nurse notified.  [employer], RN notified.  Dr. Oliva Salmon notified. Leita Arts, MD notified. Pt is on 1:1 observation with Kristi, MHT

## 2024-05-22 NOTE — Progress Notes (Signed)
 Pt is in bedroom.

## 2024-05-22 NOTE — Progress Notes (Signed)
 Recreation Therapy Notes  Patient unable to be assessed at this time due to her being manic and unable to focus. Patient is also very hyper-verbal. LRT will attempt to complete assessment at a later time.   [name], LRT,CTRS [name] A [name] 05/22/2024 2:07 PM

## 2024-05-22 NOTE — Group Note (Signed)
 Recreation Therapy Group Note   Group Topic:Coping Skills  Group Date: 05/22/2024 Start Time: 1015 End Time: 1045 Facilitators: Amadi Frady-McCall, LRT,CTRS Location: 500 Hall Dayroom   Group Topic: Coping Skills    Goal Area(s) Addresses: Patient will define what a coping skill is. Patient will create a list of healthy coping skills beginning with each letter of the alphabet. Patient will successfully identify positive coping skills they can use post d/c.  Patient will acknowledge benefit(s) of using learned coping skills post d/c.   Behavioral Response: Manic   Intervention: Worksheet   Activity: Coping A to Z. Patient asked to identify what a coping skill is and when they use them. Patients with Clinical research associate discussed healthy versus unhealthy coping skills. Next patients were given a blank worksheet titled Coping Skills A-Z. Patients were instructed to come up with at least one positive coping skill per letter of the alphabet.Patients were given 15 minutes to brainstorm before ideas were presented to the large group. Patients and LRT debriefed on the importance of coping skill selection based on situation and back-up plans when a skill tried is not effective. At the end of group, patients were given an handout of alphabetized strategies to keep for future reference.   Education: Pharmacologist, Scientist, physiological, Discharge Planning.    Education Outcome: Acknowledges education/Verbalizes understanding/In group clarification offered/Additional education needed   Affect/Mood: Manic   Participation Level: Minimal   Participation Quality: Independent   Behavior: Bizarre   Speech/Thought Process: Delusional   Insight: Lacking   Judgement: Lacking    Modes of Intervention: Worksheet   Patient Response to Interventions:  Disengaged   Education Outcome:  In group clarification offered    Clinical Observations/Individualized Feedback: Pt was hyper-verbal and religious  focused. Pt needed constant redirection due to over talking peers and not being able to focus.    Plan: Continue to engage patient in RT group sessions 2-3x/week.   [name], LRT,CTRS  05/22/2024 1:30 PM

## 2024-05-22 NOTE — Progress Notes (Signed)
 Pt put on paper scrubs I brought her. She proceeded to rip the sides of the shirt because she is hot. Encouraged to turn heat off in bedroom. Approx 10 min later pt took her top off completely.

## 2024-05-22 NOTE — Progress Notes (Signed)
Nursing 1:1 note D:Pt observed sitting in dayroom RR even and unlabored. No distress noted. A: 1:1 observation continues for safety  R: pt remains safe

## 2024-05-23 DIAGNOSIS — F29 Unspecified psychosis not due to a substance or known physiological condition: Secondary | ICD-10-CM | POA: Diagnosis not present

## 2024-05-23 LAB — TSH: TSH: 5.38 u[IU]/mL — ABNORMAL HIGH (ref 0.350–4.500)

## 2024-05-23 LAB — FOLATE: Folate: 9.2 ng/mL (ref >5.9)

## 2024-05-23 LAB — VITAMIN D 25 HYDROXY (VIT D DEFICIENCY, FRACTURES): Vit D, 25-Hydroxy: 71.02 ng/mL (ref 30–100)

## 2024-05-23 LAB — VITAMIN B12: Vitamin B-12: 1669 pg/mL — ABNORMAL HIGH (ref 180–914)

## 2024-05-23 MED ORDER — ATENOLOL 50 MG PO TABS
100.0000 mg | ORAL_TABLET | Freq: Every day | ORAL | Status: DC
Start: 2024-05-24 — End: 2024-06-03
  Administered 2024-05-24 – 2024-06-03 (×11): 100 mg via ORAL
  Filled 2024-05-23: qty 2
  Filled 2024-05-23: qty 4
  Filled 2024-05-23 (×9): qty 2

## 2024-05-23 MED ORDER — CHLORPROMAZINE HCL 25 MG PO TABS
50.0000 mg | ORAL_TABLET | Freq: Three times a day (TID) | ORAL | Status: DC
  Filled 2024-05-23 (×2): qty 2

## 2024-05-23 MED ORDER — OLANZAPINE 10 MG PO TBDP
10.0000 mg | ORAL_TABLET | Freq: Two times a day (BID) | ORAL | Status: DC
  Administered 2024-05-23 – 2024-05-24 (×2): 10 mg via ORAL
  Filled 2024-05-23 (×2): qty 1

## 2024-05-23 NOTE — Progress Notes (Signed)
 Pt noted on hallway phone, crying with rapid, pressured speech. Noted to be defensive, argumentative and oppositional on interactions. Refused scheduled Thorazine  at 1700 when offered.  1:1 maintained with assigned staff in attendance. Continued support, encouragement and reassurance offered.

## 2024-05-23 NOTE — Progress Notes (Signed)
 Pt tolerated lunch and fluids well. Observed pacing in room naked after taking her shower; refusing to put her clothes on. Tolerated lunch and fluids well. Remains restless, hyperactive, refusing to take oral PRNs when offered on multiple attempts. 1:1 maintained with assigned staff in attendance. Continued support, encouragement and reassurance offered.

## 2024-05-23 NOTE — Plan of Care (Signed)
   Problem: Education: Goal: Knowledge of [facility] Education information/materials will improve Outcome: Progressing   Problem: Activity: Goal: Interest or engagement in activities will improve Outcome: Progressing   Problem: Coping: Goal: Ability to verbalize frustrations and anger appropriately will improve Outcome: Progressing   Problem: Safety: Goal: Periods of time without injury will increase Outcome: Progressing

## 2024-05-23 NOTE — Plan of Care (Signed)
  Problem: Activity: Goal: Interest or engagement in activities will improve Outcome: Progressing   Problem: Safety: Goal: Periods of time without injury will increase Outcome: Progressing   Problem: Coping: Goal: Ability to demonstrate self-control will improve Outcome: Not Progressing

## 2024-05-23 NOTE — Progress Notes (Signed)
 [facility] Inpatient Psychiatry Progress Note  Date: 05/23/24 Patient: [name] MRN: [id number]  Assessment and Plan: [name] is a 58 y.o. female admitted involuntarily for acute psychosis, agitation, and mania. Her UDS was negative for tested substances.     # Psychosis (HCC) - Consistent with acute mania, suggesting bipolar disorder - Olanzapine  10 mg BID - Thorazine  50 mg TID for manic behaviors - lorazepam  2 mg q8h prn for acute mania and agitation   # Hypothyroidism - Synthroid  100 mcg daily - TSH elevated at admission, suspect poor compliance    Risk Assessment - High due to decompensated psychosis/acute mania  Discharge Planning Barriers to discharge: ongoing mania Estimated length of stay: 5-10 days Predicted Discharge location: Home     Interval History and update: Patient slept little last night. She continues to be grossly psychotic and manic. She remains loud, restless, intrusive, and requiring frequent redirection. Assessment was largely pointless due to the severity of her symptoms. She was unable meaningfully participate in assessment.       Physical Exam MSK/Neuro - Normal gait and station Mental Status Exam Appearance - Disheveled Attitude - guarded Speech - pressured Mood - No clear mood given Affect - Labile Thought Process - Disorganized Thought Content - Various paranoid delusions and ideation SI/HI - Denies Perceptions - Denies Judgement/Insight - Grossly impaired Fund of knowledge - WNL Language - No impairments      Lab Results:  Admission on 05/21/2024  Component Date Value Ref Range Status   TSH 05/23/2024 5.380 (H)  0.350 - 4.500 uIU/mL Final   Vitamin B-12 05/23/2024 1,669 (H)  180 - 914 pg/mL Final   Folate 05/23/2024 9.2  >5.9 ng/mL Final   Vit D, 25-Hydroxy 05/23/2024 71.02  30 - 100 ng/mL Final     Vitals: Blood pressure 130/87, pulse 70, temperature 97.8 F (36.6 C), temperature source  Oral, resp. rate 18, height 5' 3 (1.6 m), weight 80 kg, SpO2 100%.    Oliva DELENA Salmon, DO

## 2024-05-23 NOTE — [facility] Group Notes (Signed)
 Adult Psychoeducational Group Note  Date:  05/23/2024 Time:  7:37 PM  Group Topic/Focus:  Goals Group:   The focus of this group is to help patients establish daily goals to achieve during treatment and discuss how the patient can incorporate goal setting into their daily lives to aide in recovery. Orientation:   The focus of this group is to educate the patient on the purpose and policies of crisis stabilization and provide a format to answer questions about their admission.  The group details unit policies and expectations of patients while admitted.  Participation Level:  Did Not Attend  Participation Quality:    Affect:    Cognitive:    Insight:   Engagement in Group:    Modes of Intervention:    Additional Comments:    [name] O 05/23/2024, 7:37 PM

## 2024-05-23 NOTE — Progress Notes (Signed)
 Pt observed to be restless, hyperactive, hyper-verbal with tangential speech, pacing in milieu, intrusive towards peers and staff and impulsive on interactions. Denies SI, HI, AVH and pain when assessed. Reports she slept well last night with good appetite. Compliant with medications when offered. Received PRN Vistaril  as ordered for anxiety with minimal effect. Tolerates meals well. Required multiple verbal redirections during structured activities as she intrudes in both peers and staff personal spaces. Tolerates fluids and breakfast well. Assigned staff in attendance with ordered 1:1. Emotional support, encouragement and reassurance offered.

## 2024-05-23 NOTE — Group Note (Signed)
 Date:  05/23/2024 Time:  8:52 PM  Group Topic/Focus:  Wrap-Up Group:   The focus of this group is to help patients review their daily goal of treatment and discuss progress on daily workbooks.    Participation Level:  Did Not Attend   [name] 05/23/2024, 8:52 PM

## 2024-05-23 NOTE — Progress Notes (Signed)
 1:1 Note Pt observed in the milieu interacting well with peers./ Pt had an crying episode during visitation, pt then left and went to the shower sating hot shower calm her down. Pt took her evening medication and right now sitting in the dayroom talking to peers. Remains on 1:1 for safety, will continue to monitor.

## 2024-05-23 NOTE — Progress Notes (Addendum)
 Nursing 1:1 note D:Pt in bathroom appearing to take a shower. RR even and unlabored. No distress noted. A: 1:1 observation continues for safety  R: pt remains safe

## 2024-05-24 DIAGNOSIS — F29 Unspecified psychosis not due to a substance or known physiological condition: Secondary | ICD-10-CM | POA: Diagnosis not present

## 2024-05-24 MED ORDER — WHITE PETROLATUM EX OINT
TOPICAL_OINTMENT | CUTANEOUS | Status: AC
  Administered 2024-05-25: 1
  Filled 2024-05-24: qty 5

## 2024-05-24 MED ORDER — DIPHENHYDRAMINE-ZINC ACETATE 2-0.1 % EX CREA
TOPICAL_CREAM | Freq: Three times a day (TID) | CUTANEOUS | Status: DC | PRN
  Filled 2024-05-24: qty 28

## 2024-05-24 MED ORDER — WHITE PETROLATUM EX OINT
TOPICAL_OINTMENT | CUTANEOUS | Status: AC
  Administered 2024-05-24: 1
  Filled 2024-05-24: qty 5

## 2024-05-24 MED ORDER — OLANZAPINE 5 MG PO TBDP
5.0000 mg | ORAL_TABLET | Freq: Four times a day (QID) | ORAL | Status: DC | PRN
  Administered 2024-05-24: 5 mg via ORAL
  Filled 2024-05-24 (×2): qty 1

## 2024-05-24 MED ORDER — OLANZAPINE 10 MG IM SOLR: 5.0000 mg | Freq: Four times a day (QID) | INTRAMUSCULAR | Status: DC | PRN

## 2024-05-24 MED ORDER — ZIPRASIDONE HCL 20 MG PO CAPS
20.0000 mg | ORAL_CAPSULE | Freq: Once | ORAL | Status: AC
  Administered 2024-05-24: 20 mg via ORAL
  Filled 2024-05-24: qty 1

## 2024-05-24 MED ORDER — OLANZAPINE 10 MG PO TBDP
20.0000 mg | ORAL_TABLET | Freq: Every day | ORAL | Status: DC
  Administered 2024-05-24 – 2024-06-02 (×10): 20 mg via ORAL
  Filled 2024-05-24 (×10): qty 2

## 2024-05-24 MED ORDER — OLANZAPINE 10 MG PO TBDP
10.0000 mg | ORAL_TABLET | Freq: Every day | ORAL | Status: DC
  Administered 2024-05-25 – 2024-06-07 (×14): 10 mg via ORAL
  Filled 2024-05-24 (×14): qty 1

## 2024-05-24 NOTE — B[facility] Group Notes (Signed)
 Adult Psychoeducational Group Note  Date:  05/24/2024 Time:  7:40 PM  Group Topic/Focus:  Goals Group:   The focus of this group is to help patients establish daily goals to achieve during treatment and discuss how the patient can incorporate goal setting into their daily lives to aide in recovery. Orientation:   The focus of this group is to educate the patient on the purpose and policies of crisis stabilization and provide a format to answer questions about their admission.  The group details unit policies and expectations of patients while admitted.  Participation Level:  Did Not Attend  Participation Quality:    Affect:    Cognitive:    Insight:   Engagement in Group:    Modes of Intervention:    Additional Comments:    Gilberto Stanforth O 05/24/2024, 7:40 PM

## 2024-05-24 NOTE — Progress Notes (Signed)
 Patient came to nurse asking for medication that would help her calm down. She stated  give me something because I just keep going and going. Ativan  2 mg was given for anxiety, agitation, second line after olanzapine .

## 2024-05-24 NOTE — Progress Notes (Signed)
(  Sleep Hours) -5.75  (Any PRNs that were needed, meds refused, or side effects to meds)- Pt continues to refuse sleep medication because she does not want to go to sleep  (Any disturbances and when (visitation, over night)-N/A  (Concerns raised by the patient)- Pt stated she refused the Thorazine  because her doctor on the outside did not prescribe it , writer explained to pt that she will have to take something along with  the Zyprexa . Pt encouraged to talk to the doctor. They don't have me on my medications I take 200 mg Atenolol   pt stated she wanted Vaseline to smother mites on her skin no mites were visible, writer explained the cream ordered would help her Sx. Writer explained that if there really were mites Vaseline would help by itself. Pt encouraged to discuss issue with the doctor.   (SI/HI/AVH)-denies

## 2024-05-24 NOTE — Progress Notes (Signed)
 1:1 Note Pt has been up all this time, back and forth from the shower and stay naked in the room. Pt complain of itchiness with redness  under her armpit, parts of chest and belly. Pt stated she is having scabies. Pt is refusing to take any medication apert from Zyprexa . Pt is very hyper verbal. Remain on 1:1 for safety, will continue to monitor

## 2024-05-24 NOTE — Progress Notes (Signed)
 1:1 Note Pt in the room continues to be hyper verbal, Geodon  20 mg PO given as ordered refused anti itchiness cream. Pt remains on 1:1 for safety, will continue to monitor.

## 2024-05-24 NOTE — Progress Notes (Signed)
 Pt is in 500 hallway in the nook where the phone is. Pt is hyperverbal and needing frequent redirection as she keeps wandering. Pt drank a Pearl shake but is not eating her breakfast.

## 2024-05-24 NOTE — Progress Notes (Signed)
(  Sleep Hours) - 4.25 (Any PRNs that were needed, meds refused, or side effects to meds)- Geodon , Zyprexa  (Any disturbances and when (visitation, over night)- Crying spell during visitation (Concerns raised by the patient)- Poor sleep, refusing meds (SI/HI/AVH)- Denies

## 2024-05-24 NOTE — Plan of Care (Signed)
  Problem: Activity: Goal: Interest or engagement in activities will improve Outcome: Progressing   Problem: Safety: Goal: Periods of time without injury will increase Outcome: Progressing

## 2024-05-24 NOTE — Progress Notes (Signed)
Nursing 1:1 note D:Pt observed sleeping in bed with eyes closed. RR even and unlabored. No distress noted. A: 1:1 observation continues for safety  R: pt remains safe  

## 2024-05-24 NOTE — Group Note (Signed)
 Date:  05/24/2024 Time:  8:40 PM  Group Topic/Focus:  Wrap-Up Group:   The focus of this group is to help patients review their daily goal of treatment and discuss progress on daily workbooks.    Participation Level:  Active  Participation Quality:  Appropriate  Affect:  Appropriate  Cognitive:  Appropriate  Insight: Appropriate  Engagement in Group:  Engaged  Modes of Intervention:  Education and Exploration  Additional Comments:  Patient attended and participated in group tonight.  She reports that her goal for today explore and to take in the opportunity  she has been given  [name] 05/24/2024, 8:40 PM

## 2024-05-24 NOTE — Group Note (Signed)
 [facility] [name] Group Therapy Note   Group Date: 05/24/2024 Start Time: 1100 End Time: 1200   Type of Therapy/Topic:  Group Therapy:  Balancing Life with Trust, Honesty, and Boundaries.   Participation Level:  Active   Description of Group:    This group will address the concept of balance and how it feels and looks when one is unbalanced. Patients will be encouraged to process areas in their lives that are out of balance, and identify reasons for remaining unbalanced. Facilitators will guide patients utilizing problem- solving interventions to address and correct the stressor making their life unbalanced. Understanding and applying boundaries will be explored and addressed for obtaining  and maintaining a balanced life. Patients will be encouraged to explore ways to assertively make their unbalanced needs known to significant others in their lives, using other group members and facilitator for support and feedback.  Therapeutic Goals: Patient will identify two or more emotions or situations they have that consume much of in their lives. Patient will identify signs/triggers that life has become out of balance:  Patient will identify two ways to set boundaries in order to achieve balance in their lives:  Patient will demonstrate ability to communicate their needs through discussion and/or role plays  Summary of Patient Progress: Patient shared and demonstrated what trust and honesty looks life for her traveling and with her grandmother. Patient shared ability to acknowledge fight/flight. Patient participated in role-play demonstrating trust & vulnerability with boundaries. 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   Therapeutic Modalities:   Cognitive Behavioral Therapy Solution-Focused Therapy Assertiveness Training   [place], LCSWA

## 2024-05-24 NOTE — Progress Notes (Signed)
 [facility] Inpatient Psychiatry Progress Note  Date: 05/24/24 Patient: [name] MRN: [id number]  Assessment and Plan: GRICELDA FOLAND is a 58 y.o. female admitted involuntarily for acute psychosis, agitation, and mania. Her UDS was negative for tested substances.   9/7 - Compliant with medications now. Still very manic/psychotic. Has only been taking olanzapine  since today.   # Psychosis (HCC) - Consistent with acute mania, suggesting bipolar disorder - Olanzapine  ODT 10/20 mg BID - Thorazine  50 mg TID for manic behaviors - lorazepam  2 mg q8h prn for acute mania and agitation   # Hypothyroidism - Synthroid  100 mcg daily - TSH elevated at admission, suspect poor compliance    Risk Assessment - High due to decompensated psychosis/acute mania  Discharge Planning Barriers to discharge: ongoing mania Estimated length of stay: 5-10 days Predicted Discharge location: Home     Interval History and update: Patient slept 4 hours last night. She was compliant with olanzapine  ODT today but continues to be restless, intrusive, and manic. Staff report that she is still exhibiting episodes of crying and that she continues to be generally histrionic. She also continues to exhibit hypersexual behaviors and keeps taking her clothes off.       Physical Exam MSK/Neuro - Normal gait and station Mental Status Exam Appearance - Disheveled Attitude - guarded Speech - pressured, hyperverbal Mood - No clear mood given Affect - Euphoric Thought Process - Disorganized Thought Content - Various paranoid delusions and ideation SI/HI - Denies Perceptions - Denies Judgement/Insight - Grossly impaired Fund of knowledge - WNL Language - No impairments      Lab Results:  Admission on 05/21/2024  Component Date Value Ref Range Status   TSH 05/23/2024 5.380 (H)  0.350 - 4.500 uIU/mL Final   Vitamin B-12 05/23/2024 1,669 (H)  180 - 914 pg/mL Final   Folate 05/23/2024  9.2  >5.9 ng/mL Final   Vit D, 25-Hydroxy 05/23/2024 71.02  30 - 100 ng/mL Final     Vitals: Blood pressure (!) 141/81, pulse 71, temperature 97.9 F (36.6 C), temperature source Oral, resp. rate 18, height 5' 3 (1.6 m), weight 80 kg, SpO2 99%.    Oliva DELENA Salmon, DO

## 2024-05-24 NOTE — [facility] Counselor (Signed)
 Adult Comprehensive Assessment  Patient ID: [name], female   DOB: 1966/04/12, 58 y.o.   MRN: [id number]  Information Source: Information source: Patient  Current Stressors:  Patient states their primary concerns and needs for treatment are:: I just need to take my medication on a timely manner Patient states their goals for this hospitilization and ongoing recovery are:: I just would like to go home home and be with my dogs and cats Educational / Learning stressors: none reported Employment / Job issues: none reported Family Relationships: I have a very good family support Surveyor, quantity / Lack of resources (include bankruptcy): none reported Housing / Lack of housing: none reported Physical health (include injuries & life threatening diseases): none reported Social relationships: I have great friends Substance abuse: none reported Bereavement / Loss: none reported  Living/Environment/Situation:  Living Arrangements: Alone Living conditions (as described by patient or guardian): its good, I have everything I need Who else lives in the home?: I live with mydogs and cats How long has patient lived in current situation?: 25 years What is atmosphere in current home: Loving, Comfortable  Family History:  Marital status: Single Are you sexually active?: No What is your sexual orientation?: hetrosexual Has your sexual activity been affected by drugs, alcohol, medication, or emotional stress?: none reported Does patient have children?: No  Childhood History:  By whom was/is the patient raised?: Both parents Additional childhood history information: NA Description of patient's relationship with caregiver when they were a child: my dad was gentle and loving, does not agreed with Patient's description of current relationship with people who raised him/her: My ralationship with my dad is great How were you disciplined when you got in trouble as a child/adolescent?: I get  yell at and beated by my mother Does patient have siblings?: Yes Number of Siblings: 1 Description of patient's current relationship with siblings: off and on he lives in [place] Did patient suffer any verbal/emotional/physical/sexual abuse as a child?: No Did patient suffer from severe childhood neglect?: No Has patient ever been sexually abused/assaulted/raped as an adolescent or adult?: No Was the patient ever a victim of a crime or a disaster?: No Witnessed domestic violence?: No Has patient been affected by domestic violence as an adult?: No  Education:  Highest grade of school patient has completed: college + Currently a Consulting civil engineer?: No Learning disability?: No  Employment/Work Situation:   Employment Situation: On disability Why is Patient on Disability: my mental help How Long has Patient Been on Disability: 25 years Patient's Job has Been Impacted by Current Illness: No Has Patient ever Been in the [employer]?: No  Financial Resources:   Surveyor, quantity resources: Receives SSI Does patient have a Lawyer or guardian?: No  Alcohol/Substance Abuse:   What has been your use of drugs/alcohol within the last 12 months?: none reported If attempted suicide, did drugs/alcohol play a role in this?: No Alcohol/Substance Abuse Treatment Hx: Past Tx, Inpatient If yes, describe treatment: NA Has alcohol/substance abuse ever caused legal problems?: No  Social Support System:   Conservation officer, nature Support System: Production assistant, radio System: i work and cats Type of faith/religion: Daoism How does patient's faith help to cope with current illness?: I pray and meditate  Leisure/Recreation:   Do You Have Hobbies?: Yes Leisure and Hobbies: logic puzzle, singing  Strengths/Needs:   What is the patient's perception of their strengths?: well just look at me, I am a goddess! Patient states they can use these personal strengths during their treatment  to contribute  to their recovery: I can help anybody, I am strong and willing Patient states these barriers may affect/interfere with their treatment: there's no barriers Patient states these barriers may affect their return to the community: I don't know Other important information patient would like considered in planning for their treatment: NA  Discharge Plan:   Currently receiving community mental health services: Yes (From Whom) Patient states concerns and preferences for aftercare planning are: I have no concerns Patient states they will know when they are safe and ready for discharge when: I am not sure, just as long as you keep me here Does patient have access to transportation?: Yes Does patient have financial barriers related to discharge medications?: No Patient description of barriers related to discharge medications: none reported Will patient be returning to same living situation after discharge?: Yes  Summary/Recommendations:   Summary and Recommendations (to be completed by the evaluator): [name] is a 58 year old Caucasian female. The patient was IVC by [employer] and was taken to [facility] ED and was later transport to [facility] for further evaluation for med management. The patient was pleasant and was wiping her teeth off with some rags. The patient stated that is originally from [place]  and was an Tourist information centre manager.   The patient denies substance use and does not endorse SI, HI and AVH. The patient would cry when she spoke of how her mother treated her when she was younger but was very broad. The patient share that she has a good time with her father and that he cares for her more than her mother. The patient appears to have incoherent with disorganized thought process. Patient will benefit from crisis stabilization, medication evaluation, group therapy and psychoeducation, in addition to case management for discharge planning. At discharge it is  recommended that Patient adhere to the established discharge plan and continue in treatment.  Rilynn Habel O Roe Wilner. 05/24/2024

## 2024-05-24 NOTE — Plan of Care (Signed)
 ?  Problem: Education: ?Goal: Mental status will improve ?Outcome: Progressing ?Goal: Verbalization of understanding the information provided will improve ?Outcome: Progressing ?  ?

## 2024-05-24 NOTE — Progress Notes (Signed)
 Pt requested for Geodon  since it worked for at the ED. Provider on call informed and gave a one time order plus Benadryl  cream for itchiness.

## 2024-05-25 DIAGNOSIS — F29 Unspecified psychosis not due to a substance or known physiological condition: Secondary | ICD-10-CM | POA: Diagnosis not present

## 2024-05-25 DIAGNOSIS — E039 Hypothyroidism, unspecified: Secondary | ICD-10-CM | POA: Diagnosis not present

## 2024-05-25 MED ORDER — WHITE PETROLATUM EX OINT
TOPICAL_OINTMENT | CUTANEOUS | Status: AC
  Administered 2024-05-25: 1
  Filled 2024-05-25: qty 5

## 2024-05-25 MED ORDER — DIVALPROEX SODIUM ER 500 MG PO TB24
500.0000 mg | ORAL_TABLET | Freq: Every day | ORAL | Status: DC
  Administered 2024-05-25 – 2024-05-26 (×2): 500 mg via ORAL
  Filled 2024-05-25 (×3): qty 1

## 2024-05-25 NOTE — Group Note (Signed)
 [name] Group Therapy Note   Group Date: 05/25/2024 Start Time: 1300 End Time: 1400   Participation:  patient was present and actively participated in the discussion.  Type of Therapy:  Group Therapy  Topic:  Stronger Together:  Building Healthy Relationships  Objective:  To explore loneliness, boundaries, and safe ways to build relationships.  Goals: Recognize healthy vs. unhealthy relationships. Learn safe ways to connect with others. Strengthen communication and [employer].  Summary:  Participants discussed loneliness, healthy connections, and setting boundaries. They explored safe ways to meet people and shared personal experiences. Key insights were reinforced through discussion and quotes.  Therapeutic Modalities Used: Cognitive Behavioral Therapy (CBT) Elements - Identifying unhealthy relationship patterns, challenging negative thoughts about connection. Dialectical Behavior Therapy (DBT) Elements - Interpersonal effectiveness, setting and maintaining boundaries. Supportive Group Therapy - Peer discussion, shared experiences, and emotional validation.   Santonio Speakman O Thoams Siefert, LCSWA 05/25/2024  6:07 PM

## 2024-05-25 NOTE — Group Note (Signed)
 Date:  05/25/2024 Time:  9:28 PM  Group Topic/Focus:  Wrap-Up Group:   The focus of this group is to help patients review their daily goal of treatment and discuss progress on daily workbooks.    Participation Level:  Active  Participation Quality:  Intrusive  Affect:  Labile  Cognitive:  Appropriate  Insight: Appropriate  Engagement in Group:  Engaged  Modes of Intervention:  Education and Exploration  Additional Comments:  Patient attended and participated in group tonight.  She reports that the best thing that happened for her today is meeting and communicating with friends.  Gwenn Chillington Dacosta 05/25/2024, 9:28 PM

## 2024-05-25 NOTE — Progress Notes (Signed)
(  Sleep Hours) -  (Any PRNs that were needed, meds refused, or side effects to meds)- lorazepam  2mg  po  (Any disturbances and when (visitation, over night)-n/a  (Concerns raised by the patient)- Patient feels 2mg  of Ativan  too large of a dose for her. Patient states willing to take 1mg  at a time.  (SI/HI/AVH)- denies

## 2024-05-25 NOTE — Plan of Care (Signed)
   Problem: Education: Goal: Emotional status will improve Outcome: Not Progressing Goal: Mental status will improve Outcome: Not Progressing

## 2024-05-25 NOTE — Progress Notes (Signed)
 [facility] Post 1:1 Observation Documentation  For the first (8) hours following discontinuation of 1:1 precautions, a progress note entry by nursing staff should be documented at least every 2 hours, reflecting the patient's behavior, condition, mood, and conversation.  Use the progress notes for additional entries.  Time 1:1 discontinued:  1 PM  Patient's Behavior:  Patient is calm and appropriate  Patient's Condition:  Patient visible in milieu  Patient's Conversation:  Patient interacting with peers  [name] 05/25/2024, 6:01 PM

## 2024-05-25 NOTE — Progress Notes (Signed)
 [facility] Post 1:1 Observation Documentation  For the first (8) hours following discontinuation of 1:1 precautions, a progress note entry by nursing staff should be documented at least every 2 hours, reflecting the patient's behavior, condition, mood, and conversation.  Use the progress notes for additional entries.  Time 1:1 discontinued:  1 PM  Patient's Behavior:  Patient is calm and appropriate  Patient's Condition:  Patient is calm and appropriate  Patient's Conversation:  Patient is calm and appropriate  [name] 05/25/2024, 7:09 PM

## 2024-05-25 NOTE — Progress Notes (Signed)
 [facility] Post 1:1 Observation Documentation  For the first (8) hours following discontinuation of 1:1 precautions, a progress note entry by nursing staff should be documented at least every 2 hours, reflecting the patient's behavior, condition, mood, and conversation.  Use the progress notes for additional entries.  Time 1:1 discontinued:  1 PM  Patient's Behavior:  Patient is visible in milieu   Patient's Condition:  Patient interacting with peers  Patient's Conversation:  Patient is calm and appropriate  [name] 05/25/2024, 5:48 PM

## 2024-05-25 NOTE — Plan of Care (Signed)
  Problem: Education: Goal: Emotional status will improve Outcome: Not Progressing   Problem: Education: Goal: Emotional status will improve Outcome: Not Progressing   Problem: Education: Goal: Mental status will improve Outcome: Progressing   Problem: Education: Goal: Mental status will improve Outcome: Progressing

## 2024-05-25 NOTE — Progress Notes (Signed)
 Recreation Therapy Notes  INPATIENT RECREATION THERAPY ASSESSMENT  Patient Details Name: [name] MRN: [id number] DOB: 1965/10/16 Today's Date: 05/25/2024       Information Obtained From: Patient  Able to Participate in Assessment/Interview: Yes (Pt needed redirection to complete the assessment. Pt expressed needing to make sure the room was the right temperature and things were in order before she would answer questions.)  Patient Presentation: Hyperverbal (Animated)  Reason for Admission (Per Patient): Other (Comments) (Pt stated she didn't know why she was here.)  Patient Stressors: Other (Comment) (Pt stated the police coming through her backyard for no reason.)  Coping Skills:   Isolation, Journal, Sports, Exercise, Meditate, Deep Breathing, Music, Impulsivity, Talk, Prayer, Avoidance, Dance, Read, Hot Bath/Shower  Leisure Interests (2+):  Games - Other (Comment), Individual - Reading, Individual - Other (Comment), Crafts - Other (Comment) (Logic games; [employer]; Film/video editor)  Frequency of Recreation/Participation: Other (Comment) Development worker, community; Read/Cook/Logic games- Daily; Embroidery-When younger)  Awareness of Community Resources:  Yes  Community Resources:  Park, Engineering geologist, Public affairs consultant, Research scientist (physical sciences)  Current Use: Yes  If no, Barriers?:    Expressed Interest in [employer] Information: No  [employer] of Residence:  Engineer, technical sales  Patient Main Form of Transportation: Set designer  Patient Strengths:  Occupational hygienist; Silly/Happy/Funny/Love to live  Patient Identified Areas of Improvement:  self-mastery'  Patient Goal for Hospitalization:  feels she's on her way to self-mastory with controlling her impulsive speech and delivery  Current SI (including self-harm):  No  Current HI:  No  Current AVH: No (Pt stated she is psycic.)  Staff Intervention Plan: Group Attendance, Collaborate with Interdisciplinary Treatment Team  Consent to Intern  Participation: N/A   Renezmae Canlas-McCall, LRT,CTRS [place] A Adonia Porada-McCall 05/25/2024, 1:04 PM

## 2024-05-25 NOTE — Group Note (Signed)
 Recreation Therapy Group Note   Group Topic:Stress Management  Group Date: 05/25/2024 Start Time: 1025 End Time: 1053 Facilitators: Daimon Kean-McCall, LRT,CTRS Location: [address] Dayroom   Group Focus: Stress Management  Goal Area(s) Addresses:  Patient will actively participate in stress management techniques presented during session.  Patient will successfully identify benefit of practicing stress management post d/c.   Behavioral Response: Hyper-verbal, Anxious  Intervention: Guided exercise with ambient sound and script  Activity : Progressive Muscle Relaxation. LRT provided education, instruction and demonstration on practice of Progressive Muscle Relaxation. Patients also participated in yoga poses to help stretch and release tension as well. LRT informed pts about resources to access pre-recorded scripts for PMR post d/c via Youtube and other apps or via internet with a smartphone, tablet, and/or computer.  Education:  Stress Management, Discharge Planning.   Education Outcome: Acknowledges education   Affect/Mood: Anxious   Participation Level: Engaged and Hyperverbal   Participation Quality: Maximum Cues   Behavior: Hyperverbal and Impulsive   Speech/Thought Process: Relevant   Insight: Moderate   Judgement: Moderate   Modes of Intervention: Stretch, Yoga   Patient Response to Interventions:  Engaged   Education Outcome:  In group clarification offered    Clinical Observations/Individualized Feedback: Pt was anxious and hyper verbal with rapid speech during group. Pt needed constant redirection to lower her voice and not over talk peers. Pt was constantly moving around even though she was able to complete the techniques.      Plan: Continue to engage patient in RT group sessions 2-3x/week.   [name], LRT,CTRS 05/25/2024 12:34 PM

## 2024-05-25 NOTE — Progress Notes (Signed)
 1:1 Note: Patient maintained on constant supervision for safety.  Remains hyperactive, argumentative with staff whenever she is redirected.  Refused Thorazine  after several attempts and education.  Stated, I have never taken Thorazine  before, it's not in my home med.  Denies suicidal thoughts,  auditory and visual hallucinations.  Continues to need redirection in milieu.  Routine safety checks maintained.

## 2024-05-25 NOTE — Progress Notes (Signed)
 [facility] Post 1:1 Observation Documentation  For the first (8) hours following discontinuation of 1:1 precautions, a progress note entry by nursing staff should be documented at least every 2 hours, reflecting the patient's behavior, condition, mood, and conversation.  Use the progress notes for additional entries.  Time 1:1 discontinued:  1 PM  Patient's Behavior:  Patient in bed sleeping  Patient's Condition:  Patient in bed sleeping.  Breathing even and unlabored  Patient's Conversation:  Patient in bed sleeping.  Almarie MALVA Lowers 05/25/2024, 6:02 PM

## 2024-05-25 NOTE — Progress Notes (Signed)
Nursing 1:1 note D:Pt observed sleeping in bed with eyes closed. RR even and unlabored. No distress noted. A: 1:1 observation continues for safety  R: pt remains safe  

## 2024-05-25 NOTE — Progress Notes (Signed)
 [facility] Inpatient Psychiatry Progress Note  Date: 05/25/24 Patient: [name] MRN: [id number]  Assessment and Plan: ZANOBIA GRIEBEL is a 58 y.o. female admitted involuntarily for acute psychosis, agitation, and mania. Her UDS was negative for tested substances.   24-hour chart review: Patient case discussed in interdisciplinary team meeting.  Vital signs reviewed without critical values.  Agitation protocol required x 1.  Today's assessment notes: Presents alert, restless with poor concentration, and oriented to name, place, and not situation. Patient continues to present with manic/psychotic episodes and wandering to other patients rooms to use the bathroom.  She has been taking olanzapine , however refusing Thorazine  for her manic episode.  Thorazine  50 mg p.o. 3 times daily discontinued and initiate Depakote  ER 24-hour tablet 500 mg p.o. daily for mood stabilization.  May titrate Depakote  ER 24-hour tablet to 750 mg p.o. daily starting 05/26/2024.  Report anxiety of #4/10 with 10 being more severe.  Denies depressive episodes.  Nursing staff report patient is sleeping 5.75 hours last night, and reports stable appetite.  Denies SI, HI, or AVH.  One-to-one nursing monitoring discontinued due to being abusive to the sitters.  9/7 - Compliant with medications now. Still very manic/psychotic. Has only been taking olanzapine  since today.   # Psychosis (HCC) - Consistent with acute mania, suggesting bipolar disorder - Olanzapine  ODT 10/20 mg BID -Initiate Depakote  ER 24-hour tablet 500 mg p.o. daily for mood stabilization.  May titrate to 750 mg p.o. daily starting 05/26/2024. - D/C Thorazine  50 mg TID for manic behaviors with the patient refusal - lorazepam  2 mg q8h prn for acute mania and agitation  # Hypothyroidism - Synthroid  100 mcg daily - TSH elevated at admission, suspect poor compliance  Risk Assessment - High due to decompensated psychosis/acute  mania  Discharge Planning Barriers to discharge: ongoing mania Estimated length of stay: 5-10 days Predicted Discharge location: Home  Interval History and update: Patient slept 4 hours last night. She was compliant with olanzapine  ODT today but continues to be restless, intrusive, and manic. Staff report that she is still exhibiting episodes of crying and that she continues to be generally histrionic. She also continues to exhibit hypersexual behaviors and keeps taking her clothes off.   Physical Exam MSK/Neuro - Normal gait and station Mental Status Exam Appearance - Disheveled Attitude - guarded Speech - pressured, hyperverbal Mood - No clear mood given Affect - Euphoric Thought Process - Disorganized Thought Content - Various paranoid delusions and ideation SI/HI - Denies Perceptions - Denies Judgement/Insight - Grossly impaired Fund of knowledge - WNL Language - No impairments  Lab Results:  Admission on 05/21/2024  Component Date Value Ref Range Status   TSH 05/23/2024 5.380 (H)  0.350 - 4.500 uIU/mL Final   Vitamin B-12 05/23/2024 1,669 (H)  180 - 914 pg/mL Final   Folate 05/23/2024 9.2  >5.9 ng/mL Final   Vit D, 25-Hydroxy 05/23/2024 71.02  30 - 100 ng/mL Final    Vitals: Blood pressure 121/85, pulse 72, temperature 97.6 F (36.4 C), temperature source Oral, resp. rate 18, height 5' 3 (1.6 m), weight 80 kg, SpO2 98%.   Ellouise [name] Azure, FNP  Patient ID: NIAMBI SMOAK, female   DOB: 10-27-1965, 58 y.o.   MRN: [id number]

## 2024-05-26 MED ORDER — DIVALPROEX SODIUM 500 MG PO DR TAB
500.0000 mg | DELAYED_RELEASE_TABLET | Freq: Three times a day (TID) | ORAL | Status: DC
Start: 2024-05-26 — End: 2024-05-30
  Administered 2024-05-26 – 2024-05-30 (×13): 500 mg via ORAL
  Filled 2024-05-26 (×13): qty 1

## 2024-05-26 MED ORDER — WHITE PETROLATUM EX OINT
TOPICAL_OINTMENT | CUTANEOUS | Status: AC
  Administered 2024-05-26: 1
  Filled 2024-05-26: qty 5

## 2024-05-26 NOTE — Plan of Care (Signed)
   Problem: Education: Goal: Emotional status will improve Outcome: Not Progressing Goal: Mental status will improve Outcome: Not Progressing

## 2024-05-26 NOTE — Progress Notes (Cosign Needed Addendum)
 [facility] Inpatient Psychiatry Progress Note  Date: 05/26/24 Patient: [name] MRN: [id number]  Assessment and Plan: [name] is a 58 y.o. female admitted involuntarily for acute psychosis, agitation, and mania. Her UDS was negative for tested substances.   24-hour chart review: Patient case discussed in interdisciplinary team meeting.  Vital signs reviewed without critical values.  Agitation protocol of lorazepam  x 1 required.  Today's assessment notes: Presents alert, calmer with improved concentration.  She is alert, more pleasant, and oriented to name, place, time and situation.  Presentation of manic and psychotic episode improving.  She continues on Olanzapine , and Depakote  DR for psychotic episode and mood stabilization.  Depakote  ER 24-hour tablet was titrated up to 500 mg p.o. 3 times daily with great improvement today. VPA levels to be obtained on 05/30/2024. Report anxiety of #3/10 with 10 being more severe.  Denies depressive episodes.  Nursing staff report patient is sleeping 6.5 hours last night, and reports stable appetite.  Observed attending and participating in therapeutic milieu and unit group activities.  Engaging also in one-on-one with other patients on the unit agitation.  She denies delusional thinking or paranoia.  Further denies SI, HI, or AVH  9/7 - Compliant with medications now. Still very manic/psychotic. Has only been taking olanzapine  since today.   # Psychosis (HCC) - Consistent with acute mania, suggesting bipolar disorder - Olanzapine  ODT 10/20 mg BID - Depakote  DR tablet 500 mg p.o. daily for mood stabilization.  May titrate as needed.  - Increase Depakote  DR tablet from 500 mg p.o. daily to 500 mg p.o. 3 times daily.  VPA levels to be obtained on 05/30/2024. D/C Thorazine  50 mg TID for manic behaviors with the patient refusal - lorazepam  2 mg q8h prn for acute mania and agitation  # Hypothyroidism - Synthroid  100 mcg daily -  TSH elevated at admission, suspect poor compliance  Risk Assessment - High due to decompensated psychosis/acute mania  Discharge Planning Barriers to discharge: ongoing mania Estimated length of stay: 5-10 days Predicted Discharge location: Home  Interval History and update: Patient slept 4 hours last night. She was compliant with olanzapine  ODT today but continues to be restless, intrusive, and manic. Staff report that she is still exhibiting episodes of crying and that she continues to be generally histrionic. She also continues to exhibit hypersexual behaviors and keeps taking her clothes off.   Physical Exam MSK/Neuro - Normal gait and station Mental Status Exam Appearance - Disheveled Attitude - guarded Speech - pressured, hyperverbal Mood - No clear mood given Affect - Euphoric Thought Process - Disorganized Thought Content - Various paranoid delusions and ideation SI/HI - Denies Perceptions - Denies Judgement/Insight - Grossly impaired Fund of knowledge - WNL Language - No impairments  Lab Results:  Admission on 05/21/2024  Component Date Value Ref Range Status   TSH 05/23/2024 5.380 (H)  0.350 - 4.500 uIU/mL Final   Vitamin B-12 05/23/2024 1,669 (H)  180 - 914 pg/mL Final   Folate 05/23/2024 9.2  >5.9 ng/mL Final   Vit D, 25-Hydroxy 05/23/2024 71.02  30 - 100 ng/mL Final    Vitals: Blood pressure (!) 155/85, pulse 70, temperature (!) 97 F (36.1 C), temperature source Oral, resp. rate 18, height 5' 3 (1.6 m), weight 80 kg, SpO2 99%.   Abigail [name] Azure, FNP  Patient ID: [name], female   DOB: 02-19-1966, 58 y.o.   MRN: [id number] Patient ID: [name], female   DOB:  Mar 05, 1966, 58 y.o.   MRN: [id number]

## 2024-05-26 NOTE — Group Note (Signed)
 Recreation Therapy Group Note   Group Topic:Communication  Group Date: 05/26/2024 Start Time: 1040 End Time: 1110 Facilitators: Lasaundra Riche-McCall, LRT,CTRS Location: [address] Dayroom   Group Topic: Communication, Problem Solving   Goal Area(s) Addresses:  Patient will effectively listen to complete activity.  Patient will identify communication skills used to make activity successful.  Patient will identify how skills used during activity can be used to reach post d/c goals.    Behavioral Response: Minimal   Intervention: Building surveyor Activity - Geometric pattern cards, pencils, blank paper    Activity: Geometric Drawings.  Three volunteers from the peer group will be shown an abstract picture with a particular arrangement of geometrical shapes.  Each round, one 'speaker' will describe the pattern, as accurately as possible without revealing the image to the group.  The remaining group members will listen and draw the picture to reflect how it is described to them. Patients with the role of 'listener' cannot ask clarifying questions but, may request that the speaker repeat a direction. Once the drawings are complete, the presenter will show the rest of the group the picture and compare how close each person came to drawing the picture. LRT will facilitate a post-activity discussion regarding effective communication and the importance of planning, listening, and asking for clarification in daily interactions with others.  Education: Environmental consultant, Active listening, Support systems, Discharge planning  Education Outcome: Acknowledges understanding/In group clarification offered/Needs additional education.    Affect/Mood: Appropriate   Participation Level: Minimal   Participation Quality: Independent   Behavior: Cooperative   Speech/Thought Process: Distracted and Relevant   Insight: Fair   Judgement: Fair    Modes of Intervention: Activity and  Problem-solving   Patient Response to Interventions:  Receptive   Education Outcome:  In group clarification offered    Clinical Observations/Individualized Feedback: Pt was a lot less disruptive and more cooperative. Pt identified facial expressions as a mode of communication. Pt made an attempt at completing the activity but was unable to work through it. Pt eventually left early and didn't return.      Plan: Continue to engage patient in RT group sessions 2-3x/week.   Reena Borromeo-McCall, LRT,CTRS 05/26/2024 1:25 PM

## 2024-05-26 NOTE — Progress Notes (Signed)
(  Sleep Hours) -2  (Any PRNs that were needed, meds refused, or side effects to meds)- Vistaril  25 mg, Ativan  2 mg  (Any disturbances and when (visitation, over night)-N/A  (Concerns raised by the patient)- D/C  (SI/HI/AVH)-denies

## 2024-05-26 NOTE — [facility] Group Notes (Signed)
 [facility] Group Notes:  (Nursing/MHT/Case Management/Adjunct)  Date:  05/26/2024  Time:  8:52 PM  Type of Therapy:  Wrap-up group  Participation Level:  Active  Participation Quality:  Appropriate  Affect:  Appropriate  Cognitive:  Appropriate  Insight:  Appropriate  Engagement in Group:  Engaged  Modes of Intervention:  Education  Summary of Progress/Problems: Goal to get medication right . Rated day 8/10.  Grayce LITTIE Essex 05/26/2024, 8:52 PM

## 2024-05-26 NOTE — Plan of Care (Signed)
  Problem: Education: Goal: Emotional status will improve Outcome: Progressing   Problem: Activity: Goal: Interest or engagement in activities will improve Outcome: Progressing Goal: Sleeping patterns will improve Outcome: Progressing   Problem: Coping: Goal: Ability to demonstrate self-control will improve Outcome: Progressing   Problem: Safety: Goal: Periods of time without injury will increase Outcome: Progressing

## 2024-05-26 NOTE — B[facility] Group Notes (Signed)
 Adult Psychoeducational Group Note  Date:  05/26/2024 Time:  7:48 PM  Group Topic/Focus:  Goals Group:   The focus of this group is to help patients establish daily goals to achieve during treatment and discuss how the patient can incorporate goal setting into their daily lives to aide in recovery. Orientation:   The focus of this group is to educate the patient on the purpose and policies of crisis stabilization and provide a format to answer questions about their admission.  The group details unit policies and expectations of patients while admitted.  Participation Level:  Did Not Attend  Participation Quality:    Affect:    Cognitive:    Insight:   Engagement in Group:    Modes of Intervention:    Additional Comments:    [name] O 05/26/2024, 7:48 PM

## 2024-05-26 NOTE — Progress Notes (Signed)
 Conversation with patient:  Patient said that she lives alone.  CSW asked her if she would like to provide consents but she declined, and explained that her neighbors help her.  Patient said that she doesn't have any guns or weapons.   Ladislav Caselli, LCSWA 05/26/2024

## 2024-05-26 NOTE — Progress Notes (Signed)
   05/26/24 1500  Psych Admission Type (Psych Patients Only)  Admission Status Involuntary  Psychosocial Assessment  Patient Complaints Suspiciousness  Eye Contact Fair  Facial Expression Animated  Affect Preoccupied  Speech Rapid  Interaction Assertive  Motor Activity Hyperactive  Appearance/Hygiene Improved  Behavior Characteristics Hyperactive  Mood Preoccupied  Thought Process  Coherency Flight of ideas  Content Preoccupation  Delusions Controlled  Perception WDL  Hallucination None reported or observed  Judgment Impaired  Confusion None  Danger to Self  Current suicidal ideation? Denies  Danger to Others  Danger to Others None reported or observed   Dar Note: Patient presents with anxious affect and mood.  Denies suicidal thoughts, auditory and visual hallucinations.  Medications given as prescribed.  Routine safety checks maintained.  Patient visible in milieu interacting well with peers.  Patient is safe on and off the unit.

## 2024-05-27 ENCOUNTER — Encounter (HOSPITAL_COMMUNITY): Payer: Self-pay

## 2024-05-27 DIAGNOSIS — F29 Unspecified psychosis not due to a substance or known physiological condition: Secondary | ICD-10-CM | POA: Diagnosis not present

## 2024-05-27 DIAGNOSIS — E039 Hypothyroidism, unspecified: Secondary | ICD-10-CM | POA: Diagnosis not present

## 2024-05-27 DIAGNOSIS — F3110 Bipolar disorder, current episode manic without psychotic features, unspecified: Principal | ICD-10-CM | POA: Insufficient documentation

## 2024-05-27 MED ORDER — WHITE PETROLATUM EX OINT
TOPICAL_OINTMENT | CUTANEOUS | Status: AC
  Administered 2024-05-27: 1
  Filled 2024-05-27: qty 5

## 2024-05-27 MED ORDER — LORAZEPAM 1 MG PO TABS
1.0000 mg | ORAL_TABLET | Freq: Three times a day (TID) | ORAL | Status: DC | PRN
  Administered 2024-05-29: 1 mg via ORAL
  Filled 2024-05-27: qty 1

## 2024-05-27 MED ORDER — ZOLPIDEM TARTRATE 5 MG PO TABS
10.0000 mg | ORAL_TABLET | Freq: Every day | ORAL | Status: DC
  Administered 2024-05-27 – 2024-05-30 (×4): 10 mg via ORAL
  Filled 2024-05-27 (×4): qty 2

## 2024-05-27 NOTE — Group Note (Signed)
 Recreation Therapy Group Note   Group Topic:Goal Setting  Group Date: 05/27/2024 Start Time: 1028 End Time: 1045 Facilitators: Lucetta Baehr-McCall, LRT,CTRS Location: [address] Dayroom   Group Topic: Goal Setting  Goal Area(s) Addresses:  Patient will participate in discussion of what a goal is. Patient will successfully identify goals they want to reach at different time frames.  Behavioral Response: Engaged  Intervention: Group Conversation, Worksheet  Activity: LRT and patients discussed what goals were. Patients were then given a worksheet were they identified goals they wanted to accomplish in a week, month, year and 5 years. Patient then had to identify any obstacles that would interfere with reaching those goals, what they would need to reach goals and what they can start doing now to work towards goals.  Education: Goal Setting  Education Outcome: Acknowledges education   Affect/Mood: Manic   Participation Level: Engaged   Participation Quality: Independent   Behavior: Hyperverbal   Speech/Thought Process: Grandiose   Insight: Moderate   Judgement: Moderate   Modes of Intervention: Worksheet   Patient Response to Interventions:  Engaged   Education Outcome:  In group clarification offered    Clinical Observations/Individualized Feedback: Pt was a little manic and hyper-verbal. Pt needed redirection during group session. Pt was able to complete assignment but had some grand ideas. Pt identified some goals as follows: week- go home to her pets and neighbors and also no further threats of/or acts of immobilization/incarceration; month- continue to get legal permission to take over abandoned home next door; year- build [employer]; and 5 years- offer workshops for relationships. Pt identified obstacles as being in the hospital. Pt expressed her needs and start towards goals are in progress.      Plan: Continue to engage patient in RT group  sessions 2-3x/week.   Genine Beckett-McCall, LRT,CTRS 05/27/2024 1:32 PM

## 2024-05-27 NOTE — Progress Notes (Signed)
 [facility] Inpatient Psychiatry Progress Note  Date: 05/27/24 Patient: [name] MRN: [id number]  Assessment and Plan: RUWAYDA CURET is a 58 y.o. female admitted involuntarily for acute psychosis, agitation, and mania. Her UDS was negative for tested substances.   Daily notes: Rosea is seen. Chart reviewed. The chart findings discussed with the treatment team. She presents a bit hypermanic, talkative, loud & labile. She reports, I'm having a wonderful day because I'm learning different thing that will help me stay well after discharge. The thing that made it so good is, I belong to this small group of patients who has the same goal as me. We all want to get better. I also like my own routine which will help me master this new routine. I'm sleeping well. I'm taking the medicines. Fei currently denies any SIHI, AVH, delusional thoughts or paranoia. She does not appear to be responding to any internal stimuli. However, she remains a bit manic, better than when she first got admitted to this [facility]. Her Depakote  level is due to be on 05-30-24. Continue current plan of care as already in progress.   # Psychosis (HCC) - Consistent with acute mania, suggesting bipolar disorder - Olanzapine  ODT 10/20 mg BID - Depakote  DR tablet 500 mg p.o. daily for mood stabilization.  May titrate as needed.  - Continue Depakote  DR 500 mg p.o. 3 times daily.  Valproic acid  levels to be obtained on 05/30/2024. D/C Thorazine  50 mg TID for manic behaviors with the patient refusal - lorazepam  2 mg q8h prn for acute mania and agitation  # Hypothyroidism - Synthroid  100 mcg daily - TSH elevated at admission, suspect poor compliance  Risk Assessment - High due to decompensated psychosis/acute mania  Discharge Planning Barriers to discharge: ongoing mania Estimated length of stay: 5-10 days Predicted Discharge location: Home  Interval History and update: Patient slept 4 hours last night. She  was compliant with olanzapine  ODT today but continues to be restless, intrusive, and manic. Staff report that she is still exhibiting episodes of crying and that she continues to be generally histrionic. She also continues to exhibit hypersexual behaviors and keeps taking her clothes off.   Physical Exam MSK/Neuro - Normal gait and station Mental Status Exam Appearance - Disheveled Attitude - guarded Speech - pressured, hyperverbal Mood - No clear mood given Affect - Euphoric Thought Process - Disorganized Thought Content - Various paranoid delusions and ideation SI/HI - Denies Perceptions - Denies Judgement/Insight - Grossly impaired Fund of knowledge - WNL Language - No impairments  Lab Results:  Admission on 05/21/2024  Component Date Value Ref Range Status   TSH 05/23/2024 5.380 (H)  0.350 - 4.500 uIU/mL Final   Vitamin B-12 05/23/2024 1,669 (H)  180 - 914 pg/mL Final   Folate 05/23/2024 9.2  >5.9 ng/mL Final   Vit D, 25-Hydroxy 05/23/2024 71.02  30 - 100 ng/mL Final    Vitals: Blood pressure (!) 140/71, pulse (!) 59, temperature (!) 97 F (36.1 C), temperature source Oral, resp. rate 16, height 5' 3 (1.6 m), weight 80 kg, SpO2 99%.   Mac Bolster, NP, pmhnp, fnp-bc. Patient ID: ALEXZA NORBECK, female   DOB: July 27, 1966, 58 y.o.   MRN: [id number] Patient ID: WRENN WILLCOX, female   DOB: June 27, 1966, 58 y.o.   MRN: [id number] Patient ID: DELMI FULFER, female   DOB: [birth year], 58 y.o.   MRN: [id number]

## 2024-05-27 NOTE — Progress Notes (Signed)
(  Sleep Hours) -7.75  (Any PRNs that were needed, meds refused, or side effects to meds)- N/A  (Any disturbances and when (visitation, over night)-N/A  (Concerns raised by the patient)- N/A  (SI/HI/AVH)-denies

## 2024-05-27 NOTE — Plan of Care (Signed)
   Problem: Education: Goal: Emotional status will improve Outcome: Progressing Goal: Mental status will improve Outcome: Progressing Goal: Verbalization of understanding the information provided will improve Outcome: Progressing   Problem: Activity: Goal: Interest or engagement in activities will improve Outcome: Progressing

## 2024-05-27 NOTE — BH IP Treatment Plan (Signed)
 Interdisciplinary Treatment and Diagnostic Plan Update  05/27/2024 Time of Session: 12:15 PM - UPDATE [name] MRN: [id number]  Principal Diagnosis: Psychosis [facility])  Secondary Diagnoses: Principal Problem:   Psychosis (HCC)   Current Medications:  Current Facility-Administered Medications  Medication Dose Route Frequency Provider Last Rate Last Admin   acetaminophen  (TYLENOL ) tablet 650 mg  650 mg Oral Q6H PRN Randall, Veronique M, NP       albuterol  (VENTOLIN  HFA) 108 (90 Base) MCG/ACT inhaler 2 puff  2 puff Inhalation Q4H PRN Ntuen, Tina C, FNP       alum & mag hydroxide-simeth (MAALOX/MYLANTA) 200-200-20 MG/5ML suspension 30 mL  30 mL Oral Q4H PRN Randall, Veronique M, NP       atenolol  (TENORMIN ) tablet 100 mg  100 mg Oral Daily Prentis Kitchens A, DO   100 mg at 05/26/24 9073   diphenhydrAMINE -zinc  acetate (BENADRYL ) 2-0.1 % cream   Topical TID PRN Bobbitt, Shalon E, NP       divalproex  (DEPAKOTE ) DR tablet 500 mg  500 mg Oral TID Parker, Alvin S, MD   500 mg at 05/26/24 1702   hydrOXYzine  (ATARAX ) tablet 25 mg  25 mg Oral TID PRN Byungura, Veronique M, NP   25 mg at 05/26/24 2039   irbesartan  (AVAPRO ) tablet 75 mg  75 mg Oral Daily Ntuen, Tina C, FNP   75 mg at 05/26/24 9074   levothyroxine  (SYNTHROID ) tablet 100 mcg  100 mcg Oral Q0600 Ntuen, Tina C, FNP   100 mcg at 05/27/24 0636   LORazepam  (ATIVAN ) tablet 2 mg  2 mg Oral Q8H PRN Prentis Kitchens A, DO   2 mg at 05/27/24 0018   magnesium  hydroxide (MILK OF MAGNESIA) suspension 30 mL  30 mL Oral Daily PRN Randall Starlyn HERO, NP       OLANZapine  zydis (ZYPREXA ) disintegrating tablet 5 mg  5 mg Oral Q6H PRN Prentis Kitchens A, DO   5 mg at 05/24/24 1057   Or   OLANZapine  (ZYPREXA ) injection 5 mg  5 mg Intramuscular Q6H PRN Prentis Kitchens A, DO       OLANZapine  zydis (ZYPREXA ) disintegrating tablet 10 mg  10 mg Oral Daily Prentis Kitchens A, DO   10 mg at 05/26/24 9073   OLANZapine  zydis (ZYPREXA ) disintegrating tablet 20 mg  20  mg Oral QHS Bouchard, Marc A, DO   20 mg at 05/26/24 2039   traZODone  (DESYREL ) tablet 50 mg  50 mg Oral QHS PRN Byungura, Veronique M, NP       PTA Medications: Medications Prior to Admission  Medication Sig Dispense Refill Last Dose/Taking   albuterol  (VENTOLIN  HFA) 108 (90 Base) MCG/ACT inhaler Inhale 2 puffs into the lungs every 4 (four) hours as needed for wheezing or shortness of breath. 18 g 5 Taking As Needed   atenolol  (TENORMIN ) 100 MG tablet TAKE 1 TABLET(100 MG) BY MOUTH TWICE DAILY 180 tablet 3 Taking   clonazePAM  (KLONOPIN ) 0.5 MG tablet Take 0.5 tablets (0.25 mg total) by mouth daily. 15 tablet 1 Taking   Digestive Enzymes (ENZYME DIGEST PO) Take by mouth.   Taking   fexofenadine  (ALLEGRA ) 60 MG tablet Take 120 mg by mouth 2 (two) times daily.   Taking   irbesartan  (AVAPRO ) 75 MG tablet TAKE 1 TABLET(75 MG) BY MOUTH DAILY 90 tablet 3 Taking   LANTUS  SOLOSTAR 100 UNIT/ML Solostar Pen Inject 26 Units into the skin daily. 45 mL 1 Taking   levothyroxine  (SYNTHROID ) 100 MCG tablet TAKE 1 TABLET(100  MCG) BY MOUTH DAILY 90 tablet 3 Taking   methocarbamol  (ROBAXIN ) 500 MG tablet Take 500 mg by mouth every 8 (eight) hours as needed for muscle spasms.   Taking As Needed   NOVOLOG  FLEXPEN 100 UNIT/ML FlexPen Inject 10-30 Units into the skin 3 (three) times daily with meals. 90 mL 2 Taking   Olopatadine HCl (PATADAY OP) Apply to eye.   Taking   OVER THE COUNTER MEDICATION 1 capsule daily. Probiotic renew life    Taking   sertraline  (ZOLOFT ) 100 MG tablet TAKE 2 TABLETS BY MOUTH EVERY DAY 60 tablet 1 Taking   Blood Glucose Monitoring Suppl (ONE TOUCH ULTRA MINI) w/Device KIT Use to check blood sugar 2 times a day. 1 kit 0    Blood Glucose Monitoring Suppl (ONETOUCH VERIO) w/Device KIT Use as instructed to check blood sugar 4X daily 1 kit 0    Continuous Glucose Sensor (FREESTYLE LIBRE 3 PLUS SENSOR) MISC Inject 1 Device into the skin continuous. Change every 15 days 6 each 1    glucose blood  (ONETOUCH VERIO) test strip Use as instructed to check blood sugar 4X daily 300 each 2    Insulin  Pen Needle (BD PEN NEEDLE NANO 2ND GEN) 32G X 4 MM MISC USE 4 TIMES DAILY 400 each 3     Patient Stressors: Other: God Brought me here    Patient Strengths: Religious Affiliation   Treatment Modalities: Medication Management, Group therapy, Case management,  1 to 1 session with clinician, Psychoeducation, Recreational therapy.   Physician Treatment Plan for Primary Diagnosis: Psychosis (HCC) Long Term Goal(s): Improvement in symptoms so as ready for discharge   Short Term Goals: Ability to identify changes in lifestyle to reduce recurrence of condition will improve Ability to verbalize feelings will improve Ability to disclose and discuss suicidal ideas Ability to demonstrate self-control will improve Ability to identify and develop effective coping behaviors will improve Ability to maintain clinical measurements within normal limits will improve Compliance with prescribed medications will improve Ability to identify triggers associated with substance abuse/mental health issues will improve  Medication Management: Evaluate patient's response, side effects, and tolerance of medication regimen.  Therapeutic Interventions: 1 to 1 sessions, Unit Group sessions and Medication administration.  Evaluation of Outcomes: Progressing  Physician Treatment Plan for Secondary Diagnosis: Principal Problem:   Psychosis (HCC)  Long Term Goal(s): Improvement in symptoms so as ready for discharge   Short Term Goals: Ability to identify changes in lifestyle to reduce recurrence of condition will improve Ability to verbalize feelings will improve Ability to disclose and discuss suicidal ideas Ability to demonstrate self-control will improve Ability to identify and develop effective coping behaviors will improve Ability to maintain clinical measurements within normal limits will improve Compliance  with prescribed medications will improve Ability to identify triggers associated with substance abuse/mental health issues will improve     Medication Management: Evaluate patient's response, side effects, and tolerance of medication regimen.  Therapeutic Interventions: 1 to 1 sessions, Unit Group sessions and Medication administration.  Evaluation of Outcomes: Progressing   RN Treatment Plan for Primary Diagnosis: Psychosis (HCC) Long Term Goal(s): Knowledge of disease and therapeutic regimen to maintain health will improve  Short Term Goals: Ability to remain free from injury will improve, Ability to verbalize frustration and anger appropriately will improve, Ability to verbalize feelings will improve, and Ability to disclose and discuss suicidal ideas  Medication Management: RN will administer medications as ordered by provider, will assess and evaluate patient's response and provide  education to patient for prescribed medication. RN will report any adverse and/or side effects to prescribing provider.  Therapeutic Interventions: 1 on 1 counseling sessions, Psychoeducation, Medication administration, Evaluate responses to treatment, Monitor vital signs and CBGs as ordered, Perform/monitor CIWA, COWS, AIMS and Fall Risk screenings as ordered, Perform wound care treatments as ordered.  Evaluation of Outcomes: Progressing   [name] Treatment Plan for Primary Diagnosis: Psychosis (HCC) Long Term Goal(s): Safe transition to appropriate next level of care at discharge, Engage patient in therapeutic group addressing interpersonal concerns.  Short Term Goals: Engage patient in aftercare planning with referrals and resources, Increase ability to appropriately verbalize feelings, Facilitate acceptance of mental health diagnosis and concerns, and Identify triggers associated with mental health/substance abuse issues  Therapeutic Interventions: Assess for all discharge needs, 1 to 1 time with Social  worker, Explore available resources and support systems, Assess for adequacy in community support network, Educate family and significant other(s) on suicide prevention, Complete Psychosocial Assessment, Interpersonal group therapy.  Evaluation of Outcomes: Progressing   Progress in Treatment: Attending groups: Yes Participating in groups: Yes Taking medication as prescribed: Yes. Toleration medication: Yes. Family/Significant other contact made: patient declined consents Patient understands diagnosis: No. Discussing patient identified problems/goals with staff: No. Medical problems stabilized or resolved: Yes. Denies suicidal/homicidal ideation: Yes. Issues/concerns per patient self-inventory: No.   New problem(s) identified:  No   New Short Term/Long Term Goal(s):     medication stabilization, elimination of SI thoughts, development of comprehensive mental wellness plan.      Patient Goals: I feel like I was forced to come here, and I didn't come by choice.    Discharge Plan or Barriers:  Patient recently admitted. CSW will continue to follow and assess for appropriate referrals and possible discharge planning.      Reason for Continuation of Hospitalization: Delusions  Suicidal ideation Paranoia   Estimated Length of Stay:  4 - 6 days  Last 3 [place] Suicide Severity Risk Score: Flowsheet Row [facility] 05[facility]20/2024 in [facility] Office Visit from 11/28/2023 in [facility] at [facility]  C-SSRS RISK CATEGORY No Risk No Risk No Risk    Last PHQ 2/9 Scores:    08/17/2022   10:41 AM 08/17/2022   10:35 AM 01/08/2022    2:06 PM  Depression screen PHQ 2/9  Decreased Interest 0 0 0  Down, Depressed, Hopeless 1 1 0  PHQ - 2 Score 1 1 0  Altered sleeping 1    Tired, decreased energy 1    Change in appetite 0     Feeling bad or failure about yourself  0    Trouble concentrating 1    Moving slowly or fidgety/restless 0    Suicidal thoughts 0    PHQ-9 Score 4    Difficult doing work/chores Somewhat difficult      Scribe for Treatment Team: Eren Ryser O Onofre Gains, LCSWA 05/27/2024 8:18 AM

## 2024-05-27 NOTE — Plan of Care (Signed)
  Problem: Education: Goal: Emotional status will improve Outcome: Progressing   Problem: Activity: Goal: Interest or engagement in activities will improve Outcome: Progressing   Problem: Safety: Goal: Periods of time without injury will increase Outcome: Progressing

## 2024-05-28 DIAGNOSIS — F3110 Bipolar disorder, current episode manic without psychotic features, unspecified: Secondary | ICD-10-CM

## 2024-05-28 MED ORDER — WHITE PETROLATUM EX OINT
TOPICAL_OINTMENT | CUTANEOUS | Status: AC
  Administered 2024-05-28: 1
  Filled 2024-05-28: qty 5

## 2024-05-28 NOTE — Group Note (Signed)
 Recreation Therapy Group Note   Group Topic:Self-Esteem  Group Date: 05/28/2024 Start Time: 1010 End Time: 1034 Facilitators: [name], LRT,CTRS Location: 500 [address] Dayroom   Group Topic: Self-Esteem  Goal Area(s) Addresses:  Patient will successfully identify positive attributes about themselves.  Patient will identify healthy ways to increase self-esteem. Patient will acknowledge benefit(s) of improved self-esteem.   Behavioral Response: Disruptive, Hyper-verbal  Intervention: Worksheet, Markers  Activity: Pictures of Me. LRT and patients discussed what identity is. Patients then identified at least one thing that identifies them. Patients were given a worksheet with four separate picture frame. In each frame, patients were to draw how they see themselves . Patients could represent parts of that identity through objects used in that role and symbols.  Education: Self-Esteem, Discharge Planning  Education Outcome: Acknowledges education/In group clarification offered/Needs additional education   Affect/Mood: Elevated   Participation Level: Active   Participation Quality: Independent   Behavior: Disruptive and Hyperverbal   Speech/Thought Process: Distracted   Insight: Fair   Judgement: Fair    Modes of Intervention: Art and Music   Patient Response to Interventions:  Receptive   Education Outcome:  In group clarification offered    Clinical Observations/Individualized Feedback: Pt was hyper-verbal and disruptive during group. Pt needed constant redirection. Pt was agitating peers with constant talking. Pt did manage to identify herself as vibes because she is a lover of tantra/yoga movements; second frame was a child because she is connected to her family; third frame music which was represented by playing instruments/singing and the last frame was healing and was represented through acupuncture and ear seeds.    Plan: Continue to engage patient in  RT group sessions 2-3x/week.   [name], LRT,CTRS  05/28/2024 12:01 PM

## 2024-05-28 NOTE — Progress Notes (Signed)
(  Sleep Hours) -4.5  (Any PRNs that were needed, meds refused, or side effects to meds)- N/A  (Any disturbances and when (visitation, over night)-N/A  (Concerns raised by the patient)- Pt continues to state I need my atenolol  pt informed she gets it once a day, I'm supposed to take it 2 times a day pt encouraged to talk to the doctor. Pt stated the [facility] said I need someone in my room to keep me company  (SI/HI/AVH)-denies

## 2024-05-28 NOTE — Progress Notes (Signed)
   05/28/24 1137  Psych Admission Type (Psych Patients Only)  Admission Status Involuntary  Psychosocial Assessment  Patient Complaints Restlessness  Eye Contact Fair  Facial Expression Animated;Anxious  Affect Preoccupied;Anxious  Speech Rapid;Pressured  Interaction Assertive;Needy;Intrusive  Motor Activity Hyperactive  Appearance/Hygiene Unremarkable  Behavior Characteristics Fidgety;Hyperactive  Mood Preoccupied  Thought Chartered certified accountant of ideas;Circumstantial  Content Preoccupation  Delusions Referential  Perception WDL  Hallucination None reported or observed  Judgment Impaired  Confusion WDL  Danger to Self  Current suicidal ideation? Denies  Agreement Not to Harm Self No  Danger to Others  Danger to Others None reported or observed

## 2024-05-28 NOTE — Plan of Care (Signed)
   Problem: Education: Goal: Emotional status will improve Outcome: Progressing Goal: Mental status will improve Outcome: Progressing   Problem: Activity: Goal: Interest or engagement in activities will improve Outcome: Progressing Goal: Sleeping patterns will improve Outcome: Progressing

## 2024-05-28 NOTE — Group Note (Signed)
 Date:  05/28/2024 Time:  8:53 PM  Group Topic/Focus:  Wrap-Up Group:   The focus of this group is to help patients review their daily goal of treatment and discuss progress on daily workbooks.    Participation Level:  Active  Participation Quality:  Appropriate  Affect:  Labile  Cognitive:  Appropriate  Insight: Appropriate  Engagement in Group:  Engaged  Modes of Intervention:  Education and Exploration  Additional Comments:  Patient attended and participated in group tonight.  She reports that going outside was the best part of her day. Smelling the outside air and communicating with others.  [name] 05/28/2024, 8:53 PM

## 2024-05-28 NOTE — Group Note (Signed)
 Occupational Therapy Group Note  Group Topic:Coping Skills  Group Date: 05/28/2024 Start Time: 1530 End Time: 1600 Facilitators: Dot [place] MATSU, OT   Group Description: Group encouraged increased engagement and participation through discussion and activity focused on Coping Ahead. Patients were split up into teams and selected a card from a stack of positive coping strategies. Patients were instructed to act out/charade the coping skill for other peers to guess and receive points for their team. Discussion followed with a focus on identifying additional positive coping strategies and patients shared how they were going to cope ahead over the weekend while continuing hospitalization stay.  Therapeutic Goal(s): Identify positive vs negative coping strategies. Identify coping skills to be used during hospitalization vs coping skills outside of hospital/at home Increase participation in therapeutic group environment and promote engagement in treatment   Participation Level: Engaged   Participation Quality: Independent   Behavior: Appropriate   Speech/Thought Process: Loose association    Affect/Mood: Appropriate   Insight: Limited   Judgement: Limited      Modes of Intervention: Education  Patient Response to Interventions:  Attentive   Plan: Continue to engage patient in OT groups 2 - 3x/week.  05/28/2024  [place] MATSU Dot, OT Zabian Swayne, OT

## 2024-05-28 NOTE — Plan of Care (Signed)
  Problem: Education: Goal: Emotional status will improve Outcome: Not Progressing   Problem: Activity: Goal: Interest or engagement in activities will improve Outcome: Not Progressing

## 2024-05-28 NOTE — Progress Notes (Signed)
 [facility] Inpatient Psychiatry Progress Note  Date: 05/28/24 Patient: [name] MRN: [id number]  Assessment and Plan: [name] is a 58 y.o. female admitted involuntarily for acute psychosis, agitation, and mania. Her UDS was negative for tested substances.   Daily notes:  Patient was interviewed alone in the day room.  She continues to present as floridly manic.  She exhibits decreased need for sleep, speech increased in rate and amount and volume and difficult to interrupt, racing thoughts, distractibility, and increased goal-directed activity.    Will continue current treatment plan.  If no improvement with the demonstrated therapeutic level of Depakote , will consider adding lithium.   # Bipolar I disorder, most recent episode (or current) manic (HCC) - Consistent with acute mania, suggesting bipolar disorder - Olanzapine  ODT 10/20 mg BID - Continue Depakote  DR 500 mg p.o. 3 times daily.  Valproic acid  levels to be obtained on 05/30/2024. D/C Thorazine  50 mg TID for manic behaviors with the patient refusal - lorazepam  1 mg q8h prn for acute mania and agitation - Ambien  10 mg nightly  # Hypothyroidism - Synthroid  100 mcg daily - TSH elevated at admission, suspect poor compliance  Risk Assessment - High due to decompensated psychosis/acute mania  Discharge Planning Barriers to discharge: ongoing mania Estimated length of stay: 5-10 days Predicted Discharge location: Home  Interval History and update: Patient slept 4 hours last night. She was compliant with olanzapine  ODT today but continues to be restless, intrusive, and manic. Staff report that she is still exhibiting episodes of crying and that she continues to be generally histrionic. She also continues to exhibit hypersexual behaviors and keeps taking her clothes off.   Physical Exam MSK/Neuro - Normal gait and station Mental Status Exam Appearance - Disheveled Attitude - guarded Speech -  pressured, hyperverbal Mood - No clear mood given Affect - Euphoric Thought Process - Disorganized Thought Content - Various paranoid delusions and ideation SI/HI - Denies Perceptions - Denies Judgement/Insight - Grossly impaired Fund of knowledge - WNL Language - No impairments  Lab Results:  Admission on 05/21/2024  Component Date Value Ref Range Status   TSH 05/23/2024 5.380 (H)  0.350 - 4.500 uIU/mL Final   Vitamin B-12 05/23/2024 1,669 (H)  180 - 914 pg/mL Final   Folate 05/23/2024 9.2  >5.9 ng/mL Final   Vit D, 25-Hydroxy 05/23/2024 71.02  30 - 100 ng/mL Final    Vitals: Blood pressure (!) 161/72, pulse 65, temperature 97.7 F (36.5 C), temperature source Oral, resp. rate 18, height 5' 3 (1.6 m), weight 80 kg, SpO2 100%.   Starleen GORMAN [name], MD

## 2024-05-28 NOTE — Group Note (Signed)
 Date:  05/28/2024 Time:  10:54 AM  Group Topic/Focus:  Goals Group:   The focus of this group is to help patients establish daily goals to achieve during treatment and discuss how the patient can incorporate goal setting into their daily lives to aide in recovery. Orientation:   The focus of this group is to educate the patient on the purpose and policies of crisis stabilization and provide a format to answer questions about their admission.  The group details unit policies and expectations of patients while admitted.    Participation Level:  Active  Participation Quality:  Intrusive  Affect:  Blunted  Cognitive:  Appropriate  Insight: Good  Engagement in Group:  Engaged  Modes of Intervention:  Discussion  Additional Comments:  Pt wants to be more persistent about seeing a DR  Ellouise Dama Molt 05/28/2024, 10:54 AM

## 2024-05-29 MED ORDER — WHITE PETROLATUM EX OINT
TOPICAL_OINTMENT | CUTANEOUS | Status: AC
  Administered 2024-05-29: 1
  Filled 2024-05-29: qty 5

## 2024-05-29 NOTE — Plan of Care (Signed)
  Problem: Activity: Goal: Interest or engagement in activities will improve Outcome: Progressing   Problem: Safety: Goal: Periods of time without injury will increase Outcome: Progressing

## 2024-05-29 NOTE — Group Note (Signed)
 Recreation Therapy Group Note   Group Topic:Leisure Education  Group Date: 05/29/2024 Start Time: 1019 End Time: 1108 Facilitators: Anae Hams-McCall, LRT,CTRS Location: [address] Dayroom   Group Topic: Leisure Education   Goal Area(s) Addresses:  Patient will successfully demonstrate knowledge of leisure and recreation interests. Patient will successfully identify benefits of leisure participation.  Patient will verbalize appropriate recreation activities to use post discharge.   Behavioral Response: Engaged   Intervention: Guess the Lyric   Activity: LRT facilitated a competitive group game that had patients guess the missing lyric to songs presented. Patients had 6 categories (Pop, Rock, R&B, Dance, Indie and Hip Hop) to choose from. Patient would spin the flicker and whatever category the spinner landed on, the patient would be read a line from that song. If they had the correct answer, they kept the card. If they made the wrong answer, everyone else got a chance to steal the point. The person with the most cards at the end, was the winner.   Education:  Leisure Programme researcher, broadcasting/film/video, Publishing copy Outcome: Acknowledges education   Affect/Mood: Appropriate   Participation Level: Engaged   Participation Quality: Independent   Behavior: Appropriate   Speech/Thought Process: Focused   Insight: Good   Judgement: Good   Modes of Intervention: Competitive Play   Patient Response to Interventions:  Engaged   Education Outcome:  In group clarification offered    Clinical Observations/Individualized Feedback: Pt was engaged during activity. Pt still over talked peers and had rapid speech. Pt needed less redirection and she was attentive to getting the answers correct. Pt would sometimes blurt out the answers but was able to be reminded of the rules.    Plan: Continue to engage patient in RT group sessions 2-3x/week.   Deklyn Trachtenberg-McCall,  LRT,CTRS 05/29/2024 1:42 PM

## 2024-05-29 NOTE — Progress Notes (Signed)
 [facility] Inpatient Psychiatry Progress Note  Date: 05/29/24 Patient: [name] MRN: [id number]  Assessment and Plan: ANDRENA MARGERUM is a 58 y.o. female admitted involuntarily for acute psychosis, agitation, and mania. Her UDS was negative for tested substances.   Daily notes:  Patient continues to continues to present as floridly manic.  She continues to exhibit decreased need for sleep, speech increased in rate and amount and volume and difficult to interrupt, racing thoughts, distractibility, and increased goal-directed activity.    Will get a Depakote  level in the morning and titrate accordingly.  She may do better with lithium in addition to the Depakote  and olanzapine .   # Bipolar I disorder, most recent episode (or current) manic (HCC) - Consistent with acute mania, suggesting bipolar disorder - Olanzapine  ODT 10/20 mg BID - Continue Depakote  DR 500 mg p.o. 3 times daily.  Valproic acid  levels to be obtained on 05/30/2024. D/C Thorazine  50 mg TID for manic behaviors with the patient refusal - lorazepam  1 mg q8h prn for acute mania and agitation - Ambien  10 mg nightly  # Hypothyroidism - Synthroid  100 mcg daily - TSH elevated at admission, suspect poor compliance  Risk Assessment - High due to decompensated psychosis/acute mania  Discharge Planning Barriers to discharge: ongoing mania Estimated length of stay: 5-10 days Predicted Discharge location: Home  Interval History and update: Patient slept 4 hours last night. She was compliant with olanzapine  ODT today but continues to be restless, intrusive, and manic. Staff report that she is still exhibiting episodes of crying and that she continues to be generally histrionic. She also continues to exhibit hypersexual behaviors and keeps taking her clothes off.   Physical Exam MSK/Neuro - Normal gait and station Mental Status Exam Appearance - Disheveled Attitude - guarded Speech - pressured,  hyperverbal Mood - No clear mood given Affect - Euphoric Thought Process - Disorganized Thought Content - Various paranoid delusions and ideation SI/HI - Denies Perceptions - Denies Judgement/Insight - Grossly impaired Fund of knowledge - WNL Language - No impairments  Lab Results:  Admission on 05/21/2024  Component Date Value Ref Range Status   TSH 05/23/2024 5.380 (H)  0.350 - 4.500 uIU/mL Final   Vitamin B-12 05/23/2024 1,669 (H)  180 - 914 pg/mL Final   Folate 05/23/2024 9.2  >5.9 ng/mL Final   Vit D, 25-Hydroxy 05/23/2024 71.02  30 - 100 ng/mL Final    Vitals: Blood pressure (!) 142/72, pulse 61, temperature 97.7 F (36.5 C), temperature source Oral, resp. rate 18, height 5' 3 (1.6 m), weight 80 kg, SpO2 99%.   Starleen GORMAN [name], MD

## 2024-05-29 NOTE — Group Note (Signed)
 Date:  05/29/2024 Time:  9:09 PM  Group Topic/Focus:  Wrap-Up Group:   The focus of this group is to help patients review their daily goal of treatment and discuss progress on daily workbooks.    Participation Level:  Active  Participation Quality:  Appropriate  Affect:  Appropriate  Cognitive:  Appropriate  Insight: Appropriate  Engagement in Group:  Engaged  Modes of Intervention:  Education and Exploration  Additional Comments:  Patient attended and participated in group tonight.  She reports that her goal was to get her medi\cation routine extablished.  [name] 05/29/2024, 9:09 PM

## 2024-05-29 NOTE — B[facility] Group Notes (Signed)
 Spirituality Group   Description: Participant directed exploration of values, beliefs and meaning   Following a brief framework of chaplain's role and ground rules of group behavior, participants are invited to share concerns or questions that engage spiritual life. Emphasis placed on common themes and shared experiences and ways to make meaning and clarify living into one's values.   Theory/Process/Goal: Utilize the theoretical framework of group therapy established by Celena Kite, Relational Cultural Theory and Rogerian approaches to facilitate relational empathy and use of the "here and now" to foster reflection, self-awareness, and sharing.   Observations: [name] was restless. She needed to come and go but other peers also needed movement so this chaplain normalized that. [name] initially found it difficult to listen while others spoke, but this was also a challenge of some peers and she increased her self awareness and made a good effort to participate and cooperate. She shared a book with the chaplain that has been a source of meaning.  Daniqua Campoy L. Delores HERO.Div

## 2024-05-30 LAB — URINALYSIS, ROUTINE W REFLEX MICROSCOPIC
Bilirubin Urine: NEGATIVE
Glucose, UA: >=500 mg/dL — AB
Hgb urine dipstick: NEGATIVE
Ketones, ur: 5 mg/dL — AB
Nitrite: NEGATIVE
Protein, ur: NEGATIVE mg/dL
Specific Gravity, Urine: 1.01 (ref 1.005–1.030)
WBC, UA: >50 WBC/hpf (ref 0–5)
pH: 7 (ref 5.0–8.0)

## 2024-05-30 LAB — VALPROIC ACID LEVEL: Valproic Acid Lvl: 73 ug/mL (ref 50–100)

## 2024-05-30 MED ORDER — DIVALPROEX SODIUM 500 MG PO DR TAB
750.0000 mg | DELAYED_RELEASE_TABLET | Freq: Two times a day (BID) | ORAL | Status: DC
  Administered 2024-05-30 – 2024-06-08 (×18): 750 mg via ORAL
  Filled 2024-05-30 (×18): qty 1

## 2024-05-30 MED ORDER — DIVALPROEX SODIUM 500 MG PO DR TAB: 500.0000 mg | DELAYED_RELEASE_TABLET | Freq: Three times a day (TID) | ORAL | Status: DC

## 2024-05-30 MED ORDER — LORAZEPAM 1 MG PO TABS
1.0000 mg | ORAL_TABLET | Freq: Three times a day (TID) | ORAL | Status: DC
  Administered 2024-05-30 – 2024-05-31 (×5): 1 mg via ORAL
  Filled 2024-05-30 (×7): qty 1

## 2024-05-30 MED ORDER — WHITE PETROLATUM EX OINT
TOPICAL_OINTMENT | CUTANEOUS | Status: AC
  Filled 2024-05-30: qty 5

## 2024-05-30 NOTE — Plan of Care (Signed)
   Problem: Education: Goal: Knowledge of [place] [facility] Education information/materials will improve Outcome: Progressing Goal: Emotional status will improve Outcome: Progressing Goal: Mental status will improve Outcome: Progressing Goal: Verbalization of understanding the information provided will improve Outcome: Progressing

## 2024-05-30 NOTE — B[facility] Group Notes (Signed)
 Adult Psychoeducational Group Note  Date:  05/30/2024 Time:  12:42 PM  Group Topic/Focus:  Goals Group:   The focus of this group is to help patients establish daily goals to achieve during treatment and discuss how the patient can incorporate goal setting into their daily lives to aide in recovery. Orientation:   The focus of this group is to educate the patient on the purpose and policies of crisis stabilization and provide a format to answer questions about their admission.  The group details unit policies and expectations of patients while admitted.  Participation Level:  Active  Participation Quality:  Appropriate  Affect:  Appropriate  Cognitive:  Appropriate  Insight: Lacking  Engagement in Group:  Monopolizing  Modes of Intervention:  Discussion  Additional Comments:  Pt attended the goals group and remained appropriate and engaged throughout the duration of the group.   Kaitlan Bin O 05/30/2024, 12:42 PM

## 2024-05-30 NOTE — Plan of Care (Signed)
   Problem: Education: Goal: Emotional status will improve Outcome: Progressing Goal: Mental status will improve Outcome: Progressing Goal: Verbalization of understanding the information provided will improve Outcome: Progressing

## 2024-05-30 NOTE — Group Note (Signed)
 Date:  05/30/2024 Time:  8:51 PM  Group Topic/Focus:  Wrap-Up Group:   The focus of this group is to help patients review their daily goal of treatment and discuss progress on daily workbooks.    Participation Level:  Active  Participation Quality:  Appropriate  Affect:  Appropriate  Cognitive:  Appropriate  Insight: Appropriate  Engagement in Group:  Engaged  Modes of Intervention:  Education and Exploration  Additional Comments:  Patient attended and particikpated in group tonight.  She reports that today she learn that there are people here who care about her. They set aside the food that she eat.  She has been sleeping, Learn that staff value her.  Gwenn Chillington Dacosta 05/30/2024, 8:51 PM

## 2024-05-30 NOTE — Progress Notes (Signed)
 [facility] Inpatient Psychiatry Progress Note  Date: 05/30/24 Patient: [name] MRN: [id number]  Assessment and Plan: [name] is a 58 y.o. female admitted involuntarily for acute psychosis, agitation, and mania. Her UDS was negative for tested substances.   Daily notes:  Patient continues to continues to present as floridly manic.  She continues to exhibit decreased need for sleep, speech increased in rate and amount and volume and difficult to interrupt, racing thoughts, distractibility, and increased goal-directed activity.    # Bipolar I disorder, most recent episode (or current) manic (HCC) - Consistent with acute mania, suggesting bipolar disorder - Olanzapine  ODT 10/20 mg BID - Switch to depakote  DR 750 mg q12h  -repeat cbc, bmp - schedule lorazepam  1 mg q8h for acute mania and agitation - Ambien  10 mg nightly  # Hypothyroidism - Synthroid  100 mcg daily - ordered free t4  Risk Assessment - High due to decompensated psychosis/acute mania  Discharge Planning Barriers to discharge: ongoing mania Estimated length of stay: 7-14 days Predicted Discharge location: Home  Interval History and update: Patient slept 6.25 hours last night. She was compliant with olanzapine  but continues to be restless, intrusive, and manic. Staff report that she is still exhibiting episodes of crying and that she continues to make grandiose statements and tangential.   Physical Exam MSK/Neuro - Normal gait and station Mental Status Exam Appearance - Disheveled Attitude - guarded Speech - pressured, hyperverbal Mood - No clear mood given Affect - Euphoric Thought Process - Disorganized Thought Content - Various paranoid delusions and ideation SI/HI - Denies Perceptions - Denies Judgement/Insight - Grossly impaired Fund of knowledge - WNL Language - No impairments  Lab Results:  Admission on 05/21/2024  Component Date Value Ref Range Status   TSH  05/23/2024 5.380 (H)  0.350 - 4.500 uIU/mL Final   Vitamin B-12 05/23/2024 1,669 (H)  180 - 914 pg/mL Final   Folate 05/23/2024 9.2  >5.9 ng/mL Final   Vit D, 25-Hydroxy 05/23/2024 71.02  30 - 100 ng/mL Final   Valproic Acid  Lvl 05/30/2024 73  50 - 100 ug/mL Final    Vitals: Blood pressure 138/69, pulse 60, temperature 97.7 F (36.5 C), temperature source Oral, resp. rate 18, height 5' 3 (1.6 m), weight 80 kg, SpO2 100%.   Prentice Espy, MD

## 2024-05-30 NOTE — Progress Notes (Signed)
   05/30/24 1026  Psych Admission Type (Psych Patients Only)  Admission Status Involuntary  Psychosocial Assessment  Patient Complaints Restlessness  Eye Contact Fair  Facial Expression Anxious  Affect Labile;Preoccupied  Speech Rapid;Tangential  Interaction Attention-seeking;Needy  Motor Activity Hyperactive  Appearance/Hygiene Unremarkable  Behavior Characteristics Hyperactive;Restless  Mood Labile;Preoccupied  Thought Process  Coherency Circumstantial;Tangential  Content Blaming others;Preoccupation  Delusions Controlled;Somatic  Perception WDL  Hallucination None reported or observed  Judgment Impaired  Confusion Mild  Danger to Self  Current suicidal ideation? Denies  Agreement Not to Harm Self Yes  Description of Agreement Verbal  Danger to Others  Danger to Others None reported or observed

## 2024-05-30 NOTE — [facility] Group Notes (Signed)
 [name] Wellness Group Note   05/30/2024 1:00pm  Type of Group and Topic: Psychoeducational Group:  Wellness  Participation Level:  over-Active  Description of Group  Wellness group introduces the topic and its focus on developing healthy habits across the spectrum and its relationship to a decrease in hospital admissions.  Six areas of wellness are discussed: physical, social spiritual, intellectual, occupational, and emotional.  Patients are asked to consider their current wellness habits and to identify areas of wellness where they are interested and able to focus on improvements.    Therapeutic Goals Patients will understand components of wellness and how they can positively impact overall health.  Patients will identify areas of wellness where they have developed good habits. Patients will identify areas of wellness where they would like to make improvements.    Summary of Patient Progress: pt required redirection throughout group as she spoke numerous times with lengthy remarks on a number of topics.  Pt identified social and environmental as wellness areas of strength and then again identified social as a wellness area that needed improvement.      Therapeutic Modalities: Cognitive Behavioral Therapy Psychoeducation    Bridget Cordella Simmonds, [name]

## 2024-05-30 NOTE — Progress Notes (Signed)
(  Sleep Hours) - 6.25 (Any PRNs that were needed, meds refused, or side effects to meds)- none (Any disturbances and when (visitation, over night)- none (Concerns raised by the patient)- none (SI/HI/AVH)- denies

## 2024-05-30 NOTE — Progress Notes (Signed)
   05/30/24 2045  Psych Admission Type (Psych Patients Only)  Admission Status Involuntary  Psychosocial Assessment  Patient Complaints Restlessness  Eye Contact Fair  Facial Expression Anxious;Animated  Affect Anxious;Labile;Preoccupied  Speech Logical/coherent;Rapid;Tangential  Interaction Assertive;Intrusive  Motor Activity Restless;Hyperactive  Appearance/Hygiene Unremarkable  Behavior Characteristics Appropriate to situation;Anxious;Cooperative  Mood Labile;Preoccupied  Thought Process  Coherency Circumstantial;Tangential  Content Blaming others;Preoccupation  Delusions Controlled  Perception WDL  Hallucination None reported or observed  Judgment Impaired  Confusion Mild  Danger to Self  Current suicidal ideation? Denies

## 2024-05-31 LAB — CBC WITH DIFFERENTIAL/PLATELET
Abs Immature Granulocytes: 0.03 K/uL (ref 0.00–0.07)
Basophils Absolute: 0 K/uL (ref 0.0–0.1)
Basophils Relative: 1 %
Eosinophils Absolute: 0.1 K/uL (ref 0.0–0.5)
Eosinophils Relative: 4 %
HCT: 33.6 % — ABNORMAL LOW (ref 36.0–46.0)
Hemoglobin: 10.8 g/dL — ABNORMAL LOW (ref 12.0–15.0)
Immature Granulocytes: 1 %
Lymphocytes Relative: 18 %
Lymphs Abs: 0.7 K/uL (ref 0.7–4.0)
MCH: 28.4 pg (ref 26.0–34.0)
MCHC: 32.1 g/dL (ref 30.0–36.0)
MCV: 88.4 fL (ref 80.0–100.0)
Monocytes Absolute: 0.5 K/uL (ref 0.1–1.0)
Monocytes Relative: 12 %
Neutro Abs: 2.4 K/uL (ref 1.7–7.7)
Neutrophils Relative %: 64 %
Platelets: 160 K/uL (ref 150–400)
RBC: 3.8 MIL/uL — ABNORMAL LOW (ref 3.87–5.11)
RDW: 15.2 % (ref 11.5–15.5)
WBC: 3.8 K/uL — ABNORMAL LOW (ref 4.0–10.5)
nRBC: 0 % (ref 0.0–0.2)

## 2024-05-31 LAB — BASIC METABOLIC PANEL WITH GFR
Anion gap: 14 (ref 5–15)
BUN: 13 mg/dL (ref 6–20)
CO2: 26 mmol/L (ref 22–32)
Calcium: 10 mg/dL (ref 8.9–10.3)
Chloride: 95 mmol/L — ABNORMAL LOW (ref 98–111)
Creatinine, Ser: 0.92 mg/dL (ref 0.44–1.00)
GFR, Estimated: >60 mL/min (ref >60)
Glucose, Bld: 398 mg/dL — ABNORMAL HIGH (ref 70–99)
Potassium: 4.6 mmol/L (ref 3.5–5.1)
Sodium: 135 mmol/L (ref 135–145)

## 2024-05-31 LAB — GLUCOSE, CAPILLARY
Glucose-Capillary: 369 mg/dL — ABNORMAL HIGH (ref 70–99)
Glucose-Capillary: 164 mg/dL — ABNORMAL HIGH (ref 70–99)
Glucose-Capillary: 211 mg/dL — ABNORMAL HIGH (ref 70–99)

## 2024-05-31 LAB — T4, FREE: Free T4: 0.96 ng/dL (ref 0.61–1.12)

## 2024-05-31 MED ORDER — INSULIN ASPART 100 UNIT/ML IJ SOLN
0.0000 [IU] | Freq: Three times a day (TID) | INTRAMUSCULAR | Status: DC
  Administered 2024-05-31: 9 [IU] via SUBCUTANEOUS
  Administered 2024-05-31: 2 [IU] via SUBCUTANEOUS
  Administered 2024-06-01: 7 [IU] via SUBCUTANEOUS
  Administered 2024-06-01: 5 [IU] via SUBCUTANEOUS
  Administered 2024-06-01: 7 [IU] via SUBCUTANEOUS
  Administered 2024-06-02: 2 [IU] via SUBCUTANEOUS
  Administered 2024-06-02: 5 [IU] via SUBCUTANEOUS
  Administered 2024-06-02: 9 [IU] via SUBCUTANEOUS
  Administered 2024-06-03: 7 [IU] via SUBCUTANEOUS
  Administered 2024-06-03: 2 [IU] via SUBCUTANEOUS
  Administered 2024-06-03 – 2024-06-04 (×2): 5 [IU] via SUBCUTANEOUS
  Administered 2024-06-04: 3 [IU] via SUBCUTANEOUS
  Administered 2024-06-04: 7 [IU] via SUBCUTANEOUS
  Administered 2024-06-05: 5 [IU] via SUBCUTANEOUS
  Administered 2024-06-05: 2 [IU] via SUBCUTANEOUS
  Administered 2024-06-05: 7 [IU] via SUBCUTANEOUS
  Administered 2024-06-06: 2 [IU] via SUBCUTANEOUS
  Administered 2024-06-06: 5 [IU] via SUBCUTANEOUS
  Administered 2024-06-06: 2 [IU] via SUBCUTANEOUS
  Administered 2024-06-07: 3 [IU] via SUBCUTANEOUS
  Administered 2024-06-07 (×2): 2 [IU] via SUBCUTANEOUS

## 2024-05-31 MED ORDER — ZOLPIDEM TARTRATE 5 MG PO TABS
5.0000 mg | ORAL_TABLET | Freq: Every day | ORAL | Status: DC
  Administered 2024-05-31 – 2024-06-07 (×7): 5 mg via ORAL
  Filled 2024-05-31 (×7): qty 1

## 2024-05-31 NOTE — Plan of Care (Signed)
   Problem: Activity: Goal: Interest or engagement in activities will improve Outcome: Progressing

## 2024-05-31 NOTE — Plan of Care (Signed)
   Problem: Education: Goal: Knowledge of [place] [facility] Education information/materials will improve Outcome: Progressing Goal: Emotional status will improve Outcome: Progressing Goal: Mental status will improve Outcome: Progressing Goal: Verbalization of understanding the information provided will improve Outcome: Progressing

## 2024-05-31 NOTE — Progress Notes (Signed)
   05/31/24 2155  Psych Admission Type (Psych Patients Only)  Admission Status Involuntary  Psychosocial Assessment  Patient Complaints None  Eye Contact Fair  Facial Expression Anxious;Animated  Affect Labile;Preoccupied  Speech Logical/coherent;Rapid;Tangential  Interaction Assertive;Intrusive  Motor Activity Slow  Appearance/Hygiene Unremarkable  Behavior Characteristics Appropriate to situation;Calm  Mood Preoccupied;Pleasant  Thought Process  Coherency Circumstantial;Tangential  Content Blaming others;Preoccupation  Delusions Controlled  Perception WDL  Hallucination None reported or observed  Judgment Impaired  Confusion Mild  Danger to Self  Current suicidal ideation? Denies

## 2024-05-31 NOTE — Group Note (Signed)
 Date:  05/31/2024 Time:  8:42 PM  Group Topic/Focus:  Wrap-Up Group:   The focus of this group is to help patients review their daily goal of treatment and discuss progress on daily workbooks.    Participation Level:  Active  Participation Quality:  Appropriate  Affect:  Appropriate  Cognitive:  Appropriate  Insight: Appropriate  Engagement in Group:  Engaged  Modes of Intervention:  Education and Exploration  Additional Comments:  Patient attended and participated in group tonight.  She reports that her goal today was to go outside but she did not get to do so.  Gwenn Chillington Dacosta 05/31/2024, 8:42 PM

## 2024-05-31 NOTE — Progress Notes (Signed)
(  Sleep Hours) - 5.75 (Any PRNs that were needed, meds refused, or side effects to meds)- none (Any disturbances and when (visitation, over night)- none (Concerns raised by the patient)- none (SI/HI/AVH)- denies all

## 2024-05-31 NOTE — Progress Notes (Addendum)
 [facility] Inpatient Psychiatry Progress Note  Date: 05/31/24 Patient: BRITTEN PARADY MRN: [id number]  Assessment and Plan: CHERINE DRUMGOOLE is a 58 y.o. female admitted involuntarily for acute psychosis, agitation, and mania. Her UDS was negative for tested substances.   Daily notes:  Patient less manic and more sedated today.  She is less hyperverbal and easier to redirect. She is ruminative about discharge to see pets but otherwise has no acute concerns. She feels scheduled ativan  may be too much but willing to take at this time. She is not as hypersexual as previously noted.  # Type 1 Bipolar Disorder - Consistent with acute mania, suggesting bipolar disorder - Olanzapine  ODT 10 mg in AM and 20 mg in PM  -A1c and lipid profile ordered, BMP showed glucose 398 - Continue depakote  DR 750 mg q12h  -repeat cbc, bmp - continue lorazepam  1 mg q8h for acute mania and agitation, can likely decrease to q12h in next day or 2 - decrease Ambien  to 5 mg nightly  # Type 2 Diabetes - ordered sensitive SSI qac and at bedtime - likely will need long acting insulin   # Hypothyroidism - Synthroid  100 mcg daily - free t4 wnl now  # Normocytic anemia Likely iron deficiency -ordered iron, tibc, retic, and ferritin  Risk Assessment - High due to acute mania  Discharge Planning Barriers to discharge: ongoing mania Estimated length of stay: 7-14 days Predicted Discharge location: Home  Interval History and update: Patient slept 6.25 hours last night. She was compliant with medications. She remains grandiose and tangential but does improving  Physical Exam MSK/Neuro - Normal gait and station Mental Status Exam Appearance - Disheveled Attitude - guarded Speech - less pressured, hyperverbal Mood - No clear mood given Affect - Euphoric Thought Process - Disorganized Thought Content - Various paranoid delusions and ideation SI/HI - Denies Perceptions -  Denies Judgement/Insight - Grossly impaired Fund of knowledge - WNL Language - No impairments  Lab Results:  Admission on 05/21/2024  Component Date Value Ref Range Status   TSH 05/23/2024 5.380 (H)  0.350 - 4.500 uIU/mL Final   Vitamin B-12 05/23/2024 1,669 (H)  180 - 914 pg/mL Final   Folate 05/23/2024 9.2  >5.9 ng/mL Final   Vit D, 25-Hydroxy 05/23/2024 71.02  30 - 100 ng/mL Final   Valproic Acid  Lvl 05/30/2024 73  50 - 100 ug/mL Final   Color, Urine 05/30/2024 YELLOW  YELLOW Final   APPearance 05/30/2024 HAZY (A)  CLEAR Final   Specific Gravity, Urine 05/30/2024 1.010  1.005 - 1.030 Final   pH 05/30/2024 7.0  5.0 - 8.0 Final   Glucose, UA 05/30/2024 >=500 (A)  NEGATIVE mg/dL Final   Hgb urine dipstick 05/30/2024 NEGATIVE  NEGATIVE Final   Bilirubin Urine 05/30/2024 NEGATIVE  NEGATIVE Final   Ketones, ur 05/30/2024 5 (A)  NEGATIVE mg/dL Final   Protein, ur 90/86/7974 NEGATIVE  NEGATIVE mg/dL Final   Nitrite 90/86/7974 NEGATIVE  NEGATIVE Final   Leukocytes,Ua 05/30/2024 LARGE (A)  NEGATIVE Final   RBC / HPF 05/30/2024 6-10  0 - 5 RBC/hpf Final   WBC, UA 05/30/2024 >50  0 - 5 WBC/hpf Final   Bacteria, UA 05/30/2024 RARE (A)  NONE SEEN Final   Squamous Epithelial / HPF 05/30/2024 6-10  0 - 5 /HPF Final   Non Squamous Epithelial 05/30/2024 0-5 (A)  NONE SEEN Final   WBC 05/31/2024 3.8 (L)  4.0 - 10.5 K/uL Final   RBC 05/31/2024 3.80 (L)  3.87 - 5.11 MIL/uL Final   Hemoglobin 05/31/2024 10.8 (L)  12.0 - 15.0 g/dL Final   HCT 90/85/7974 33.6 (L)  36.0 - 46.0 % Final   MCV 05/31/2024 88.4  80.0 - 100.0 fL Final   MCH 05/31/2024 28.4  26.0 - 34.0 pg Final   MCHC 05/31/2024 32.1  30.0 - 36.0 g/dL Final   RDW 90/85/7974 15.2  11.5 - 15.5 % Final   Platelets 05/31/2024 160  150 - 400 K/uL Final   nRBC 05/31/2024 0.0  0.0 - 0.2 % Final   Neutrophils Relative % 05/31/2024 64  % Final   Neutro Abs 05/31/2024 2.4  1.7 - 7.7 K/uL Final   Lymphocytes Relative 05/31/2024 18  % Final    Lymphs Abs 05/31/2024 0.7  0.7 - 4.0 K/uL Final   Monocytes Relative 05/31/2024 12  % Final   Monocytes Absolute 05/31/2024 0.5  0.1 - 1.0 K/uL Final   Eosinophils Relative 05/31/2024 4  % Final   Eosinophils Absolute 05/31/2024 0.1  0.0 - 0.5 K/uL Final   Basophils Relative 05/31/2024 1  % Final   Basophils Absolute 05/31/2024 0.0  0.0 - 0.1 K/uL Final   Immature Granulocytes 05/31/2024 1  % Final   Abs Immature Granulocytes 05/31/2024 0.03  0.00 - 0.07 K/uL Final   Sodium 05/31/2024 135  135 - 145 mmol/L Final   Potassium 05/31/2024 4.6  3.5 - 5.1 mmol/L Final   Chloride 05/31/2024 95 (L)  98 - 111 mmol/L Final   CO2 05/31/2024 26  22 - 32 mmol/L Final   Glucose, Bld 05/31/2024 398 (H)  70 - 99 mg/dL Final   BUN 90/85/7974 13  6 - 20 mg/dL Final   Creatinine, Ser 05/31/2024 0.92  0.44 - 1.00 mg/dL Final   Calcium  05/31/2024 10.0  8.9 - 10.3 mg/dL Final   GFR, Estimated 05/31/2024 >60  >60 mL/min Final   Anion gap 05/31/2024 14  5 - 15 Final    Vitals: Blood pressure 110/78, pulse 66, temperature 98.3 F (36.8 C), temperature source Oral, resp. rate 18, height 5' 3 (1.6 m), weight 80 kg, SpO2 96%.   Prentice Espy, MD

## 2024-05-31 NOTE — Progress Notes (Signed)
   05/31/24 0927  Psych Admission Type (Psych Patients Only)  Admission Status Involuntary  Psychosocial Assessment  Patient Complaints Anxiety  Eye Contact Fair  Facial Expression Anxious  Affect Anxious  Speech Soft  Interaction Assertive  Motor Activity Restless  Appearance/Hygiene Unremarkable  Behavior Characteristics Anxious  Mood Anxious;Labile  Thought Process  Coherency Circumstantial  Content Ambivalence;Blaming others  Delusions Somatic  Perception Illusions  Hallucination None reported or observed  Judgment Impaired  Confusion Mild  Danger to Self  Current suicidal ideation? Denies  Agreement Not to Harm Self Yes  Description of Agreement Verbal  Danger to Others  Danger to Others None reported or observed

## 2024-05-31 NOTE — B[facility] Group Notes (Signed)
 Adult Psychoeducational Group Note  Date:  05/31/2024 Time:  7:51 PM  Group Topic/Focus:  Goals Group:   The focus of this group is to help patients establish daily goals to achieve during treatment and discuss how the patient can incorporate goal setting into their daily lives to aide in recovery. Orientation:   The focus of this group is to educate the patient on the purpose and policies of crisis stabilization and provide a format to answer questions about their admission.  The group details unit policies and expectations of patients while admitted.  Participation Level:  Did Not Attend  Participation Quality:    Affect:    Cognitive:    Insight:   Engagement in Group:    Modes of Intervention:    Additional Comments:    Elverta Dimiceli O 05/31/2024, 7:51 PM

## 2024-06-01 ENCOUNTER — Encounter (HOSPITAL_COMMUNITY): Payer: Self-pay

## 2024-06-01 LAB — RETICULOCYTES
Immature Retic Fract: 16.7 % — ABNORMAL HIGH (ref 2.3–15.9)
RBC.: 3.91 MIL/uL (ref 3.87–5.11)
Retic Count, Absolute: 111.8 K/uL (ref 19.0–186.0)
Retic Ct Pct: 2.9 % (ref 0.4–3.1)

## 2024-06-01 LAB — IRON AND TIBC
Iron: 66 ug/dL (ref 28–170)
Saturation Ratios: 16 % (ref 10.4–31.8)
TIBC: 406 ug/dL (ref 250–450)
UIBC: 341 ug/dL

## 2024-06-01 LAB — LIPID PANEL
Cholesterol: 225 mg/dL — ABNORMAL HIGH (ref 0–200)
HDL: 79 mg/dL (ref >40)
LDL Cholesterol: 120 mg/dL — ABNORMAL HIGH (ref 0–99)
Total CHOL/HDL Ratio: 2.8 ratio
Triglycerides: 130 mg/dL (ref <150)
VLDL: 26 mg/dL (ref 0–40)

## 2024-06-01 LAB — HEMOGLOBIN A1C
Hgb A1c MFr Bld: 7.7 % — ABNORMAL HIGH (ref 4.8–5.6)
Mean Plasma Glucose: 174.29 mg/dL

## 2024-06-01 LAB — FERRITIN: Ferritin: 91 ng/mL (ref 11–307)

## 2024-06-01 LAB — GLUCOSE, CAPILLARY
Glucose-Capillary: 266 mg/dL — ABNORMAL HIGH (ref 70–99)
Glucose-Capillary: 323 mg/dL — ABNORMAL HIGH (ref 70–99)
Glucose-Capillary: 317 mg/dL — ABNORMAL HIGH (ref 70–99)
Glucose-Capillary: 297 mg/dL — ABNORMAL HIGH (ref 70–99)

## 2024-06-01 MED ORDER — INSULIN GLARGINE 100 UNIT/ML ~~LOC~~ SOLN
13.0000 [IU] | Freq: Every day | SUBCUTANEOUS | Status: DC
  Filled 2024-06-01: qty 0.13

## 2024-06-01 MED ORDER — INSULIN GLARGINE-YFGN 100 UNIT/ML ~~LOC~~ SOPN: 13.0000 [IU] | PEN_INJECTOR | Freq: Every day | SUBCUTANEOUS | Status: DC

## 2024-06-01 MED ORDER — INSULIN GLARGINE-YFGN 100 UNIT/ML ~~LOC~~ SOLN
13.0000 [IU] | Freq: Every day | SUBCUTANEOUS | Status: DC
  Administered 2024-06-01: 13 [IU] via SUBCUTANEOUS

## 2024-06-01 MED ORDER — LORAZEPAM 0.5 MG PO TABS
0.5000 mg | ORAL_TABLET | Freq: Three times a day (TID) | ORAL | Status: DC
  Filled 2024-06-01: qty 1

## 2024-06-01 MED ORDER — DOCUSATE SODIUM 100 MG PO CAPS
100.0000 mg | ORAL_CAPSULE | Freq: Every day | ORAL | Status: DC | PRN
  Administered 2024-06-02: 100 mg via ORAL
  Filled 2024-06-01: qty 1

## 2024-06-01 NOTE — Group Note (Signed)
 [name] Group Therapy Note   Group Date: 06/01/2024 Start Time: 1100 End Time: 1200   Participation:  did not attend   Type of Therapy:  Group Therapy  Topic:  Healing From Within: Understanding Our Past, Building Our Future"  Objective:  To help participants understand the impact of early experiences on mental and physical health, with a focus on Adverse Childhood Experiences (ACEs), and to explore ways to build resilience and healing.  Summary: In today's session, we discussed how early experiences, especially ACEs, impact mental and physical health.  We explored the effects of stress, abuse, and neglect on brain development and well-being. The group focused on resilience, understanding that healing and positive change are possible with support and self-awareness.  Group Goals: Understand ACEs and Their Impact: Learn how childhood experiences shape mental and physical health. Build Resilience: Develop strategies for overcoming challenges and creating positive change. Promote Healing: Recognize the value of support and the possibility of healing through therapy and self-care.  Therapeutic Modalities Used: Psychoeducation: Sharing information about ACEs and their effects. Cognitive Behavioral Therapy (CBT): Helping reframe negative thought patterns. Trauma-Informed Therapy: Creating a safe, supportive space for healing.   [name], LCSWA 06/01/2024  4:17 PM

## 2024-06-01 NOTE — Progress Notes (Signed)
(  Sleep Hours) -8 (Any PRNs that were needed, meds refused, or side effects to meds)- none (Any disturbances and when (visitation, over night)- none (Concerns raised by the patient)- pt reports that she feels the ativan  1 mg is too much for her. (SI/HI/AVH)- denies all

## 2024-06-01 NOTE — Progress Notes (Signed)
 Pt c/o bloating and being uncomfortable. States she had a bowel movement today but that it was small and hard. Endorses drinking fluids today. I want something gentle, like Dulcolax, nothing that causes a violent reaction. Pt refused MOM that has already been prescribed. Asking for a non-stimulant laxative that pulls water  into your system. Provider notified.

## 2024-06-01 NOTE — Plan of Care (Signed)
   Problem: Education: Goal: Emotional status will improve Outcome: Progressing Goal: Mental status will improve Outcome: Progressing   Problem: Activity: Goal: Interest or engagement in activities will improve Outcome: Progressing   Problem: Coping: Goal: Ability to demonstrate self-control will improve Outcome: Progressing

## 2024-06-01 NOTE — Progress Notes (Signed)
 [facility] Inpatient Psychiatry Progress Note  Date: 06/01/24 Patient: [name] MRN: [id number]  Assessment and Plan: [name] is a 58 y.o. female admitted involuntarily for acute psychosis, agitation, and mania. Her UDS was negative for tested substances.   9/5 - Patient making slow progress. She is started to exhibit sedation to her medications so we will start pulling back on lorazepam .   # Bipolar I disorder, most recent episode (or current) manic (HCC) - Consistent with acute mania, suggesting bipolar disorder - Olanzapine  ODT 10/20 mg BID - Depakote  750 mg q12h. Level 73 on 9/13 - lorazepam  0.5 mg q8h. Tapering.   # Hypothyroidism - Synthroid  100 mcg daily - TSH elevated at admission, suspect poor compliance  # DM2 - Sliding scale insulin  - Lantus  13 units nightly    Risk Assessment - High due to decompensated psychosis/acute mania  Discharge Planning Barriers to discharge: ongoing mania Estimated length of stay: 5-10 days Predicted Discharge location: Home     Interval History and update: Patient slept a bit over 6 hours last night. She has been compliant with her prescribed medications. Staff reported that she was a bit sedated this morning and held lorazepam . She has been visible in the milieu and remains paranoid, manic, and labile, but less floridly so. She has been intermittently napping in her room.     Physical Exam MSK/Neuro - Normal gait and station Mental Status Exam Appearance - casually dressed, adequate grooming and hygiene Attitude - guarded Speech - hyperverbal, otherwise normal Mood - No clear mood given Affect - Labile Thought Process - tangential Thought Content - endorses belief that someone is bugging her house SI/HI - Denies Perceptions - Denies Judgement/Insight - Grossly impaired Fund of knowledge - WNL Language - No impairments      Lab Results:  Admission on 05/21/2024  Component Date Value  Ref Range Status   TSH 05/23/2024 5.380 (H)  0.350 - 4.500 uIU/mL Final   Vitamin B-12 05/23/2024 1,669 (H)  180 - 914 pg/mL Final   Folate 05/23/2024 9.2  >5.9 ng/mL Final   Vit D, 25-Hydroxy 05/23/2024 71.02  30 - 100 ng/mL Final   Valproic Acid  Lvl 05/30/2024 73  50 - 100 ug/mL Final   Color, Urine 05/30/2024 YELLOW  YELLOW Final   APPearance 05/30/2024 HAZY (A)  CLEAR Final   Specific Gravity, Urine 05/30/2024 1.010  1.005 - 1.030 Final   pH 05/30/2024 7.0  5.0 - 8.0 Final   Glucose, UA 05/30/2024 >=500 (A)  NEGATIVE mg/dL Final   Hgb urine dipstick 05/30/2024 NEGATIVE  NEGATIVE Final   Bilirubin Urine 05/30/2024 NEGATIVE  NEGATIVE Final   Ketones, ur 05/30/2024 5 (A)  NEGATIVE mg/dL Final   Protein, ur 90/86/7974 NEGATIVE  NEGATIVE mg/dL Final   Nitrite 90/86/7974 NEGATIVE  NEGATIVE Final   Leukocytes,Ua 05/30/2024 LARGE (A)  NEGATIVE Final   RBC / HPF 05/30/2024 6-10  0 - 5 RBC/hpf Final   WBC, UA 05/30/2024 >50  0 - 5 WBC/hpf Final   Bacteria, UA 05/30/2024 RARE (A)  NONE SEEN Final   Squamous Epithelial / HPF 05/30/2024 6-10  0 - 5 /HPF Final   Non Squamous Epithelial 05/30/2024 0-5 (A)  NONE SEEN Final   Free T4 05/31/2024 0.96  0.61 - 1.12 ng/dL Final   WBC 90/85/7974 3.8 (L)  4.0 - 10.5 K/uL Final   RBC 05/31/2024 3.80 (L)  3.87 - 5.11 MIL/uL Final   Hemoglobin 05/31/2024 10.8 (L)  12.0 -  15.0 g/dL Final   HCT 90/85/7974 33.6 (L)  36.0 - 46.0 % Final   MCV 05/31/2024 88.4  80.0 - 100.0 fL Final   MCH 05/31/2024 28.4  26.0 - 34.0 pg Final   MCHC 05/31/2024 32.1  30.0 - 36.0 g/dL Final   RDW 90/85/7974 15.2  11.5 - 15.5 % Final   Platelets 05/31/2024 160  150 - 400 K/uL Final   nRBC 05/31/2024 0.0  0.0 - 0.2 % Final   Neutrophils Relative % 05/31/2024 64  % Final   Neutro Abs 05/31/2024 2.4  1.7 - 7.7 K/uL Final   Lymphocytes Relative 05/31/2024 18  % Final   Lymphs Abs 05/31/2024 0.7  0.7 - 4.0 K/uL Final   Monocytes Relative 05/31/2024 12  % Final   Monocytes Absolute  05/31/2024 0.5  0.1 - 1.0 K/uL Final   Eosinophils Relative 05/31/2024 4  % Final   Eosinophils Absolute 05/31/2024 0.1  0.0 - 0.5 K/uL Final   Basophils Relative 05/31/2024 1  % Final   Basophils Absolute 05/31/2024 0.0  0.0 - 0.1 K/uL Final   Immature Granulocytes 05/31/2024 1  % Final   Abs Immature Granulocytes 05/31/2024 0.03  0.00 - 0.07 K/uL Final   Sodium 05/31/2024 135  135 - 145 mmol/L Final   Potassium 05/31/2024 4.6  3.5 - 5.1 mmol/L Final   Chloride 05/31/2024 95 (L)  98 - 111 mmol/L Final   CO2 05/31/2024 26  22 - 32 mmol/L Final   Glucose, Bld 05/31/2024 398 (H)  70 - 99 mg/dL Final   BUN 90/85/7974 13  6 - 20 mg/dL Final   Creatinine, Ser 05/31/2024 0.92  0.44 - 1.00 mg/dL Final   Calcium  05/31/2024 10.0  8.9 - 10.3 mg/dL Final   GFR, Estimated 05/31/2024 >60  >60 mL/min Final   Anion gap 05/31/2024 14  5 - 15 Final   Glucose-Capillary 05/31/2024 369 (H)  70 - 99 mg/dL Final   Comment 1 90/85/7974 Notify RN   Final   Glucose-Capillary 05/31/2024 164 (H)  70 - 99 mg/dL Final   Hgb J8r MFr Bld 06/01/2024 7.7 (H)  4.8 - 5.6 % Final   Mean Plasma Glucose 06/01/2024 174.29  mg/dL Final   Cholesterol 90/84/7974 225 (H)  0 - 200 mg/dL Final   Triglycerides 90/84/7974 130  <150 mg/dL Final   HDL 90/84/7974 79  >40 mg/dL Final   Total CHOL/HDL Ratio 06/01/2024 2.8  RATIO Final   VLDL 06/01/2024 26  0 - 40 mg/dL Final   LDL Cholesterol 06/01/2024 120 (H)  0 - 99 mg/dL Final   Iron 90/84/7974 66  28 - 170 ug/dL Final   TIBC 90/84/7974 406  250 - 450 ug/dL Final   Saturation Ratios 06/01/2024 16  10.4 - 31.8 % Final   UIBC 06/01/2024 341  ug/dL Final   Ferritin 90/84/7974 91  11 - 307 ng/mL Final   Retic Ct Pct 06/01/2024 2.9  0.4 - 3.1 % Final   RBC. 06/01/2024 3.91  3.87 - 5.11 MIL/uL Final   Retic Count, Absolute 06/01/2024 111.8  19.0 - 186.0 K/uL Final   Immature Retic Fract 06/01/2024 16.7 (H)  2.3 - 15.9 % Final   Glucose-Capillary 05/31/2024 211 (H)  70 - 99 mg/dL  Final   Glucose-Capillary 06/01/2024 266 (H)  70 - 99 mg/dL Final   Glucose-Capillary 06/01/2024 323 (H)  70 - 99 mg/dL Final   Glucose-Capillary 06/01/2024 317 (H)  70 - 99 mg/dL Final  Vitals: Blood pressure 134/80, pulse 69, temperature (!) 97.4 F (36.3 C), temperature source Oral, resp. rate 18, height 5' 3 (1.6 m), weight 80 kg, SpO2 97%.    Oliva DELENA Salmon, DO

## 2024-06-01 NOTE — Group Note (Unsigned)
 Date:  06/01/2024 Time:  8:29 PM  Group Topic/Focus:  Wrap-Up Group:   The focus of this group is to help patients review their daily goal of treatment and discuss progress on daily workbooks.     Participation Level:  {BHH PARTICIPATION OZCZO:77735}  Participation Quality:  {BHH PARTICIPATION QUALITY:22265}  Affect:  {BHH AFFECT:22266}  Cognitive:  {BHH COGNITIVE:22267}  Insight: {BHH Insight2:20797}  Engagement in Group:  {BHH ENGAGEMENT IN HMNLE:77731}  Modes of Intervention:  {BHH MODES OF INTERVENTION:22269}  Additional Comments:  ***  Jenean Escandon Dacosta 06/01/2024, 8:29 PM

## 2024-06-01 NOTE — Group Note (Signed)
 Recreation Therapy Group Note   Group Topic:Communication  Group Date: 06/01/2024 Start Time: 1025 End Time: 1053 Facilitators: Elim Peale-McCall, LRT,CTRS Location: [address] Dayroom   Group Topic: Communication, Team Building, Problem Solving  Goal Area(s) Addresses:  Patient will effectively work with peer towards shared goal.  Patient will identify skills used to make activity successful.  Patient will identify how skills used during activity can be applied to reach post d/c goals.   Behavioral Response: Engaged  Intervention: STEM Activity- Glass blower/designer  Activity: Tallest [employer]. In teams of 5-6, patients were given 11 craft pipe cleaners. Using the materials provided, patients were instructed to compete again the opposing team(s) to build the tallest free-standing structure from floor level. The activity was timed; difficulty increased by Clinical research associate as Production designer, theatre/television/film continued.  Systematically resources were removed with additional directions for example, placing one arm behind their back, working in silence, and shape stipulations. LRT facilitated post-activity discussion reviewing team processes and necessary communication skills involved in completion. Patients were encouraged to reflect how the skills utilized, or not utilized, in this activity can be incorporated to positively impact support systems post discharge.  Education: Pharmacist, community, Scientist, physiological, Discharge Planning   Education Outcome: Acknowledges education/In group clarification offered/Needs additional education.    Affect/Mood: Appropriate   Participation Level: Engaged   Participation Quality: Independent   Behavior: Appropriate   Speech/Thought Process: Focused   Insight: Moderate   Judgement: Moderate   Modes of Intervention: STEM Activity   Patient Response to Interventions:  Engaged   Education Outcome:  In group clarification offered    Clinical  Observations/Individualized Feedback: Pt was a lot more focused and less disruptive. Pt speech was less rapid. Pt worked well with peer in thinking of and constructing their tower. Pt was able to stay on task and follow instructions.      Plan: Continue to engage patient in RT group sessions 2-3x/week.   [name], LRT,CTRS 06/01/2024 1:25 PM

## 2024-06-01 NOTE — BH IP Treatment Plan (Signed)
 Interdisciplinary Treatment and Diagnostic Plan Update  06/01/2024 Time of Session: 12:15 PM - UPDATE [name] MRN: [id number]  Principal Diagnosis: Bipolar I disorder, most recent episode (or current) manic (HCC)  Secondary Diagnoses: Principal Problem:   Bipolar I disorder, most recent episode (or current) manic (HCC) Active Problems:   Psychosis (HCC)   Current Medications:  Current Facility-Administered Medications  Medication Dose Route Frequency Provider Last Rate Last Admin   acetaminophen  (TYLENOL ) tablet 650 mg  650 mg Oral Q6H PRN Randall, Veronique M, NP       albuterol  (VENTOLIN  HFA) 108 (90 Base) MCG/ACT inhaler 2 puff  2 puff Inhalation Q4H PRN Ntuen, Tina C, FNP       alum & mag hydroxide-simeth (MAALOX/MYLANTA) 200-200-20 MG/5ML suspension 30 mL  30 mL Oral Q4H PRN Randall, Veronique M, NP       atenolol  (TENORMIN ) tablet 100 mg  100 mg Oral Daily Prentis Kitchens A, DO   100 mg at 06/01/24 9165   diphenhydrAMINE -zinc  acetate (BENADRYL ) 2-0.1 % cream   Topical TID PRN Bobbitt, Shalon E, NP       divalproex  (DEPAKOTE ) DR tablet 750 mg  750 mg Oral Q12H Ji, Andrew, MD   750 mg at 06/01/24 9166   hydrOXYzine  (ATARAX ) tablet 25 mg  25 mg Oral TID PRN Randall Starlyn HERO, NP   25 mg at 05/31/24 0757   insulin  aspart (novoLOG ) injection 0-9 Units  0-9 Units Subcutaneous TID WC Lynnette Barter, MD   7 Units at 06/01/24 1146   irbesartan  (AVAPRO ) tablet 75 mg  75 mg Oral Daily Ntuen, Tina C, FNP   75 mg at 06/01/24 9164   levothyroxine  (SYNTHROID ) tablet 100 mcg  100 mcg Oral Q0600 Ntuen, Tina C, FNP   100 mcg at 06/01/24 0631   LORazepam  (ATIVAN ) tablet 0.5 mg  0.5 mg Oral Q8H Bouchard, Marc A, DO       magnesium  hydroxide (MILK OF MAGNESIA) suspension 30 mL  30 mL Oral Daily PRN Randall Starlyn HERO, NP       OLANZapine  zydis (ZYPREXA ) disintegrating tablet 5 mg  5 mg Oral Q6H PRN Prentis Kitchens A, DO   5 mg at 05/24/24 1057   Or   OLANZapine  (ZYPREXA ) injection 5 mg  5 mg  Intramuscular Q6H PRN Prentis Kitchens A, DO       OLANZapine  zydis (ZYPREXA ) disintegrating tablet 10 mg  10 mg Oral Daily Prentis Kitchens A, DO   10 mg at 06/01/24 9166   OLANZapine  zydis (ZYPREXA ) disintegrating tablet 20 mg  20 mg Oral QHS Bouchard, Marc A, DO   20 mg at 05/31/24 2051   traZODone  (DESYREL ) tablet 50 mg  50 mg Oral QHS PRN Randall Starlyn HERO, NP       zolpidem  (AMBIEN ) tablet 5 mg  5 mg Oral QHS Lynnette Barter, MD   5 mg at 05/31/24 2053   PTA Medications: Medications Prior to Admission  Medication Sig Dispense Refill Last Dose/Taking   albuterol  (VENTOLIN  HFA) 108 (90 Base) MCG/ACT inhaler Inhale 2 puffs into the lungs every 4 (four) hours as needed for wheezing or shortness of breath. 18 g 5 Taking As Needed   atenolol  (TENORMIN ) 100 MG tablet TAKE 1 TABLET(100 MG) BY MOUTH TWICE DAILY 180 tablet 3 Taking   clonazePAM  (KLONOPIN ) 0.5 MG tablet Take 0.5 tablets (0.25 mg total) by mouth daily. 15 tablet 1 Taking   Digestive Enzymes (ENZYME DIGEST PO) Take by mouth.   Taking   fexofenadine  (  ALLEGRA ) 60 MG tablet Take 120 mg by mouth 2 (two) times daily.   Taking   irbesartan  (AVAPRO ) 75 MG tablet TAKE 1 TABLET(75 MG) BY MOUTH DAILY 90 tablet 3 Taking   LANTUS  SOLOSTAR 100 UNIT/ML Solostar Pen Inject 26 Units into the skin daily. 45 mL 1 Taking   levothyroxine  (SYNTHROID ) 100 MCG tablet TAKE 1 TABLET(100 MCG) BY MOUTH DAILY 90 tablet 3 Taking   methocarbamol  (ROBAXIN ) 500 MG tablet Take 500 mg by mouth every 8 (eight) hours as needed for muscle spasms.   Taking As Needed   NOVOLOG  FLEXPEN 100 UNIT/ML FlexPen Inject 10-30 Units into the skin 3 (three) times daily with meals. 90 mL 2 Taking   Olopatadine HCl (PATADAY OP) Apply to eye.   Taking   OVER THE COUNTER MEDICATION 1 capsule daily. Probiotic renew life    Taking   sertraline  (ZOLOFT ) 100 MG tablet TAKE 2 TABLETS BY MOUTH EVERY DAY 60 tablet 1 Taking   Blood Glucose Monitoring Suppl (ONE TOUCH ULTRA MINI) w/Device KIT Use to  check blood sugar 2 times a day. 1 kit 0    Blood Glucose Monitoring Suppl (ONETOUCH VERIO) w/Device KIT Use as instructed to check blood sugar 4X daily 1 kit 0    Continuous Glucose Sensor (FREESTYLE LIBRE 3 PLUS SENSOR) MISC Inject 1 Device into the skin continuous. Change every 15 days 6 each 1    glucose blood (ONETOUCH VERIO) test strip Use as instructed to check blood sugar 4X daily 300 each 2    Insulin  Pen Needle (BD PEN NEEDLE NANO 2ND GEN) 32G X 4 MM MISC USE 4 TIMES DAILY 400 each 3     Patient Stressors: Other: God Brought me here    Patient Strengths: Religious Affiliation   Treatment Modalities: Medication Management, Group therapy, Case management,  1 to 1 session with clinician, Psychoeducation, Recreational therapy.   Physician Treatment Plan for Primary Diagnosis: Bipolar I disorder, most recent episode (or current) manic (HCC) Long Term Goal(s): Improvement in symptoms so as ready for discharge   Short Term Goals: Ability to identify changes in lifestyle to reduce recurrence of condition will improve Ability to verbalize feelings will improve Ability to disclose and discuss suicidal ideas Ability to demonstrate self-control will improve Ability to identify and develop effective coping behaviors will improve Ability to maintain clinical measurements within normal limits will improve Compliance with prescribed medications will improve Ability to identify triggers associated with substance abuse/mental health issues will improve  Medication Management: Evaluate patient's response, side effects, and tolerance of medication regimen.  Therapeutic Interventions: 1 to 1 sessions, Unit Group sessions and Medication administration.  Evaluation of Outcomes: Progressing  Physician Treatment Plan for Secondary Diagnosis: Principal Problem:   Bipolar I disorder, most recent episode (or current) manic (HCC) Active Problems:   Psychosis (HCC)  Long Term Goal(s): Improvement  in symptoms so as ready for discharge   Short Term Goals: Ability to identify changes in lifestyle to reduce recurrence of condition will improve Ability to verbalize feelings will improve Ability to disclose and discuss suicidal ideas Ability to demonstrate self-control will improve Ability to identify and develop effective coping behaviors will improve Ability to maintain clinical measurements within normal limits will improve Compliance with prescribed medications will improve Ability to identify triggers associated with substance abuse/mental health issues will improve     Medication Management: Evaluate patient's response, side effects, and tolerance of medication regimen.  Therapeutic Interventions: 1 to 1 sessions, Unit Group sessions and  Medication administration.  Evaluation of Outcomes: Progressing   RN Treatment Plan for Primary Diagnosis: Bipolar I disorder, most recent episode (or current) manic (HCC) Long Term Goal(s): Knowledge of disease and therapeutic regimen to maintain health will improve  Short Term Goals: Ability to remain free from injury will improve, Ability to verbalize frustration and anger appropriately will improve, Ability to verbalize feelings will improve, and Ability to disclose and discuss suicidal ideas  Medication Management: RN will administer medications as ordered by provider, will assess and evaluate patient's response and provide education to patient for prescribed medication. RN will report any adverse and/or side effects to prescribing provider.  Therapeutic Interventions: 1 on 1 counseling sessions, Psychoeducation, Medication administration, Evaluate responses to treatment, Monitor vital signs and CBGs as ordered, Perform/monitor CIWA, COWS, AIMS and Fall Risk screenings as ordered, Perform wound care treatments as ordered.  Evaluation of Outcomes: Progressing   [name] Treatment Plan for Primary Diagnosis: Bipolar I disorder, most recent  episode (or current) manic (HCC) Long Term Goal(s): Safe transition to appropriate next level of care at discharge, Engage patient in therapeutic group addressing interpersonal concerns.  Short Term Goals: Engage patient in aftercare planning with referrals and resources, Increase ability to appropriately verbalize feelings, Facilitate acceptance of mental health diagnosis and concerns, and Identify triggers associated with mental health/substance abuse issues  Therapeutic Interventions: Assess for all discharge needs, 1 to 1 time with Social worker, Explore available resources and support systems, Assess for adequacy in community support network, Educate family and significant other(s) on suicide prevention, Complete Psychosocial Assessment, Interpersonal group therapy.  Evaluation of Outcomes: Progressing   Progress in Treatment: Attending groups: attended some groups Participating in groups: Yes Taking medication as prescribed: Yes. Toleration medication: Yes. Family/Significant other contact made: patient declined consents Patient understands diagnosis: No. Discussing patient identified problems/goals with staff: No. Medical problems stabilized or resolved: Yes. Denies suicidal/homicidal ideation: Yes. Issues/concerns per patient self-inventory: No.   New problem(s) identified:  No   New Short Term/Long Term Goal(s):     medication stabilization, elimination of SI thoughts, development of comprehensive mental wellness plan.      Patient Goals: I feel like I was forced to come here, and I didn't come by choice.    Discharge Plan or Barriers:  Patient recently admitted. CSW will continue to follow and assess for appropriate referrals and possible discharge planning.      Reason for Continuation of Hospitalization: Delusions  Suicidal ideation Paranoia   Estimated Length of Stay:  3 - 4 days  Last 3 [place] Suicide Severity Risk Score: Flowsheet Row Admission (Current)  [facility]20/2024 in [facility] Visit from 11/28/2023 in [facility] at [facility]  C-SSRS RISK CATEGORY No Risk No Risk No Risk    Last PHQ 2/9 Scores:    08/17/2022   10:41 AM 08/17/2022   10:35 AM 01/08/2022    2:06 PM  Depression screen PHQ 2/9  Decreased Interest 0 0 0  Down, Depressed, Hopeless 1 1 0  PHQ - 2 Score 1 1 0  Altered sleeping 1    Tired, decreased energy 1    Change in appetite 0    Feeling bad or failure about yourself  0    Trouble concentrating 1    Moving slowly or fidgety/restless 0    Suicidal thoughts 0    PHQ-9 Score 4  Difficult doing work/chores Somewhat difficult      Scribe for Treatment Team: Felicite Zeimet O Gyneth Hubka, LCSWA 06/01/2024 4:47 PM

## 2024-06-01 NOTE — Group Note (Signed)
 Date:  06/01/2024 Time:  8:49 PM  Group Topic/Focus:  Wrap-Up Group:   The focus of this group is to help patients review their daily goal of treatment and discuss progress on daily workbooks.    Participation Level:  Active  Participation Quality:  Appropriate  Affect:  Appropriate  Cognitive:  Appropriate  Insight: Appropriate  Engagement in Group:  Engaged  Modes of Intervention:  Education and Exploration  Additional Comments:  Patient attended and participated in group tonight. She reports that her goal today was to continue to get better. She has medication change and thinks its working because she os feeling better  [name] 06/01/2024, 8:49 PM

## 2024-06-01 NOTE — Progress Notes (Signed)
   06/01/24 0834  Psych Admission Type (Psych Patients Only)  Admission Status Involuntary  Psychosocial Assessment  Patient Complaints None  Eye Contact Fair  Facial Expression Animated;Anxious;Worried;Pained  Affect Labile;Preoccupied  Primary school teacher;Intrusive  Motor Activity Slow  Appearance/Hygiene Unremarkable  Behavior Characteristics Appropriate to situation;Cooperative  Mood Preoccupied  Thought Process  Coherency Circumstantial;Tangential  Content Blaming others;Preoccupation  Delusions Controlled  Perception WDL  Hallucination None reported or observed  Judgment Impaired  Confusion Mild  Danger to Self  Current suicidal ideation? Denies  Agreement Not to Harm Self Yes  Description of Agreement verbal  Danger to Others  Danger to Others None reported or observed

## 2024-06-01 NOTE — Telephone Encounter (Signed)
 No response from patient.   MMG screening letter sent via MyChart, copy printed to be signed and mailed.   Routing to provider for final review. Will close encounter.

## 2024-06-01 NOTE — Progress Notes (Signed)
 [name] was very drowsy and had a difficult time staying awake while waiting for her vitals to be taken and while her vitals were being taken. She almost fell once she stood up. She was very unsteady on her feet and was escorted back to her room after vitals and lab. She was made a high fall risk.

## 2024-06-01 NOTE — Group Note (Unsigned)
 Date:  06/01/2024 Time:  8:21 PM  Group Topic/Focus:  Wrap-Up Group:   The focus of this group is to help patients review their daily goal of treatment and discuss progress on daily workbooks.     Participation Level:  {BHH PARTICIPATION OZCZO:77735}  Participation Quality:  {BHH PARTICIPATION QUALITY:22265}  Affect:  {BHH AFFECT:22266}  Cognitive:  {BHH COGNITIVE:22267}  Insight: {BHH Insight2:20797}  Engagement in Group:  {BHH ENGAGEMENT IN HMNLE:77731}  Modes of Intervention:  {BHH MODES OF INTERVENTION:22269}  Additional Comments:  ***  Gwenn Nobie [place] 06/01/2024, 8:21 PM

## 2024-06-01 NOTE — Inpatient Diabetes Management (Signed)
 Inpatient Diabetes Program Recommendations  AACE/ADA: New Consensus Statement on Inpatient Glycemic Control (2015)  Target Ranges:  Prepandial:   less than 140 mg/dL      Peak postprandial:   less than 180 mg/dL (1-2 hours)      Critically ill patients:  140 - 180 mg/dL   Lab Results  Component Value Date   GLUCAP 266 (H) 06/01/2024   HGBA1C 7.7 (H) 06/01/2024    Review of Glycemic Control  Latest Reference Range & Units 05/31/24 12:00 05/31/24 17:25 05/31/24 19:18 06/01/24 06:10  Glucose-Capillary 70 - 99 mg/dL 630 (H) 835 (H) 788 (H) 266 (H)  (H): Data is abnormally high  Diabetes history: DM2 Outpatient Diabetes medications: Lantus  26 units every day, Novolog  10-30 units TID, Freestyle Libre 3 Current orders for Inpatient glycemic control: Novolog  0-9 units TID  Inpatient Diabetes Program Recommendations:    Please consider:  Glargine 13 units every day (50% of home dose).  Thank you, Wyvonna Pinal, MSN, CDCES Diabetes Coordinator Inpatient Diabetes Program [phone] (team pager from 8a-5p)

## 2024-06-01 NOTE — Progress Notes (Addendum)
   06/01/24 2000  Psych Admission Type (Psych Patients Only)  Admission Status Involuntary  Psychosocial Assessment  Patient Complaints Anxiety  Eye Contact Fair  Facial Expression Animated;Anxious;Worried  Affect Anxious  Surveyor, minerals Activity Slow  Appearance/Hygiene In hospital gown  Behavior Characteristics Appropriate to situation;Cooperative  Mood Anxious;Pleasant  Thought Process  Coherency Tangential;Circumstantial  Content Preoccupation  Delusions None reported or observed  Perception WDL  Hallucination None reported or observed  Judgment Impaired  Confusion UTA  Danger to Self  Current suicidal ideation? Denies  Danger to Others  Danger to Others None reported or observed   Progress note   D: Pt seen in her room before group. Pt denies SI, HI, AVH. Pt rates pain  0/10. Pt rates anxiety  6/10 and depression  0/10. Pt states anxiety is related to discharge. I don't know when I'm leaving and no one is telling me anything. Pt encouraged to speak to provider in the morning and ask directly about discharge. Pt states she is worried about her animals. She has 2 dogs. I just want to be home with my animals. That's what I was doing - sitting with my animals when the police came and got me. Pt c/o dizziness. VS assessed and wnl. No other concerns noted at this time.  A: Pt provided support and encouragement. Pt given scheduled medication as prescribed. Pt refused Ativan  0.5 mg d/t not wanting to be sedated. I don't want any benzodiazepine of any kind. Ambien  is fine to help me sleep at night. PRNs as appropriate. Q15 min checks for safety.   R: Pt safe on the unit. Will continue to monitor.

## 2024-06-02 ENCOUNTER — Ambulatory Visit: Admitting: Psychology

## 2024-06-02 LAB — GLUCOSE, CAPILLARY
Glucose-Capillary: 188 mg/dL — ABNORMAL HIGH (ref 70–99)
Glucose-Capillary: 379 mg/dL — ABNORMAL HIGH (ref 70–99)
Glucose-Capillary: 300 mg/dL — ABNORMAL HIGH (ref 70–99)
Glucose-Capillary: 234 mg/dL — ABNORMAL HIGH (ref 70–99)

## 2024-06-02 MED ORDER — INSULIN GLARGINE-YFGN 100 UNIT/ML ~~LOC~~ SOLN
15.0000 [IU] | Freq: Every day | SUBCUTANEOUS | Status: DC
Start: 2024-06-02 — End: 2024-06-03

## 2024-06-02 NOTE — Inpatient Diabetes Management (Signed)
 Inpatient Diabetes Program Recommendations  AACE/ADA: New Consensus Statement on Inpatient Glycemic Control (2015)  Target Ranges:  Prepandial:   less than 140 mg/dL      Peak postprandial:   less than 180 mg/dL (1-2 hours)      Critically ill patients:  140 - 180 mg/dL   Lab Results  Component Value Date   GLUCAP 379 (H) 06/02/2024   HGBA1C 7.7 (H) 06/01/2024    Review of Glycemic Control  Latest Reference Range & Units 06/02/24 06:25 06/02/24 12:47  Glucose-Capillary 70 - 99 mg/dL 811 (H) 620 (H)  (H): Data is abnormally high  Diabetes history: DM2 Outpatient Diabetes medications: Lantus  26 units every day, Novolog  10-30 units TID, Freestyle Libre 3 Current orders for Inpatient glycemic control: Novolog  0-9 units TID, Glargine 13 units QD  Inpatient Diabetes Program Recommendations:    Please consider:  Novolog  3 units TID with meals if she consumes at least 50%.    Thank you, Wyvonna Pinal, MSN, CDCES Diabetes Coordinator Inpatient Diabetes Program [phone] (team pager from 8a-5p)

## 2024-06-02 NOTE — Plan of Care (Signed)
  Problem: Education: Goal: Emotional status will improve Outcome: Progressing Goal: Mental status will improve Outcome: Progressing Goal: Verbalization of understanding the information provided will improve Outcome: Progressing   Problem: Activity: Goal: Sleeping patterns will improve Outcome: Progressing   Problem: Health Behavior/Discharge Planning: Goal: Compliance with treatment plan for underlying cause of condition will improve Outcome: Progressing   Problem: Physical Regulation: Goal: Ability to maintain clinical measurements within normal limits will improve Outcome: Progressing

## 2024-06-02 NOTE — Plan of Care (Signed)
  Problem: Coping: Goal: Ability to verbalize frustrations and anger appropriately will improve Outcome: Progressing   Problem: Activity: Goal: Interest or engagement in activities will improve Outcome: Progressing   Problem: Education: Goal: Verbalization of understanding the information provided will improve Outcome: Progressing

## 2024-06-02 NOTE — Progress Notes (Signed)
 [facility] Inpatient Psychiatry Progress Note  Date: 06/02/24 Patient: [name] MRN: [id number]  Assessment and Plan: HANNA RA is a 58 y.o. female admitted involuntarily for acute psychosis, agitation, and mania. Her UDS was negative for tested substances.   9/16 - Mania continues to resolve. Requested extension on her involuntary hold today. Likely discharge over the weekend or early next week at this point.   # Bipolar I disorder, most recent episode (or current) manic (HCC) - Consistent with acute mania, suggesting bipolar disorder - Olanzapine  ODT 10/20 mg BID - Depakote  750 mg q12h. Level 73 on 9/13 - Stop lorazepam .   # Hypothyroidism - Synthroid  100 mcg daily - TSH elevated at admission, suspect poor compliance  # DM2 - Sliding scale insulin  - Lantus  15 units nightly    Risk Assessment - High due to decompensated psychosis/acute mania  Discharge Planning Barriers to discharge: ongoing mania Estimated length of stay: 5-10 days Predicted Discharge location: Home     Interval History and update: Patient slept for about 7.5 hours last night. She received over 10 units of corrective insulin  yesterday and BS this afternoon was 379. She continues to manic, affectively labile, and intrusive with other patients. She has been participating in group events. Staff report that she was passing around a piece of paper earlier apparently attempting to obtain the contact information for other patients. She continues to endorse paranoid ideation. She has been refusing oral lorazepam  today.      Physical Exam MSK/Neuro - Normal gait and station Mental Status Exam Appearance - casually dressed, adequate grooming and hygiene Attitude - guarded Speech - hyperverbal, otherwise normal Mood - No clear mood given Affect - Labile Thought Process - tangential Thought Content - endorses belief that someone is bugging her house SI/HI -  Denies Perceptions - Denies Judgement/Insight - Grossly impaired Fund of knowledge - WNL Language - No impairments      Lab Results:  Admission on 05/21/2024  Component Date Value Ref Range Status   TSH 05/23/2024 5.380 (H)  0.350 - 4.500 uIU/mL Final   Vitamin B-12 05/23/2024 1,669 (H)  180 - 914 pg/mL Final   Folate 05/23/2024 9.2  >5.9 ng/mL Final   Vit D, 25-Hydroxy 05/23/2024 71.02  30 - 100 ng/mL Final   Valproic Acid  Lvl 05/30/2024 73  50 - 100 ug/mL Final   Color, Urine 05/30/2024 YELLOW  YELLOW Final   APPearance 05/30/2024 HAZY (A)  CLEAR Final   Specific Gravity, Urine 05/30/2024 1.010  1.005 - 1.030 Final   pH 05/30/2024 7.0  5.0 - 8.0 Final   Glucose, UA 05/30/2024 >=500 (A)  NEGATIVE mg/dL Final   Hgb urine dipstick 05/30/2024 NEGATIVE  NEGATIVE Final   Bilirubin Urine 05/30/2024 NEGATIVE  NEGATIVE Final   Ketones, ur 05/30/2024 5 (A)  NEGATIVE mg/dL Final   Protein, ur 90/86/7974 NEGATIVE  NEGATIVE mg/dL Final   Nitrite 90/86/7974 NEGATIVE  NEGATIVE Final   Leukocytes,Ua 05/30/2024 LARGE (A)  NEGATIVE Final   RBC / HPF 05/30/2024 6-10  0 - 5 RBC/hpf Final   WBC, UA 05/30/2024 >50  0 - 5 WBC/hpf Final   Bacteria, UA 05/30/2024 RARE (A)  NONE SEEN Final   Squamous Epithelial / HPF 05/30/2024 6-10  0 - 5 /HPF Final   Non Squamous Epithelial 05/30/2024 0-5 (A)  NONE SEEN Final   Free T4 05/31/2024 0.96  0.61 - 1.12 ng/dL Final   WBC 90/85/7974 3.8 (L)  4.0 - 10.5 K/uL  Final   RBC 05/31/2024 3.80 (L)  3.87 - 5.11 MIL/uL Final   Hemoglobin 05/31/2024 10.8 (L)  12.0 - 15.0 g/dL Final   HCT 90/85/7974 33.6 (L)  36.0 - 46.0 % Final   MCV 05/31/2024 88.4  80.0 - 100.0 fL Final   MCH 05/31/2024 28.4  26.0 - 34.0 pg Final   MCHC 05/31/2024 32.1  30.0 - 36.0 g/dL Final   RDW 90/85/7974 15.2  11.5 - 15.5 % Final   Platelets 05/31/2024 160  150 - 400 K/uL Final   nRBC 05/31/2024 0.0  0.0 - 0.2 % Final   Neutrophils Relative % 05/31/2024 64  % Final   Neutro Abs 05/31/2024  2.4  1.7 - 7.7 K/uL Final   Lymphocytes Relative 05/31/2024 18  % Final   Lymphs Abs 05/31/2024 0.7  0.7 - 4.0 K/uL Final   Monocytes Relative 05/31/2024 12  % Final   Monocytes Absolute 05/31/2024 0.5  0.1 - 1.0 K/uL Final   Eosinophils Relative 05/31/2024 4  % Final   Eosinophils Absolute 05/31/2024 0.1  0.0 - 0.5 K/uL Final   Basophils Relative 05/31/2024 1  % Final   Basophils Absolute 05/31/2024 0.0  0.0 - 0.1 K/uL Final   Immature Granulocytes 05/31/2024 1  % Final   Abs Immature Granulocytes 05/31/2024 0.03  0.00 - 0.07 K/uL Final   Sodium 05/31/2024 135  135 - 145 mmol/L Final   Potassium 05/31/2024 4.6  3.5 - 5.1 mmol/L Final   Chloride 05/31/2024 95 (L)  98 - 111 mmol/L Final   CO2 05/31/2024 26  22 - 32 mmol/L Final   Glucose, Bld 05/31/2024 398 (H)  70 - 99 mg/dL Final   BUN 90/85/7974 13  6 - 20 mg/dL Final   Creatinine, Ser 05/31/2024 0.92  0.44 - 1.00 mg/dL Final   Calcium  05/31/2024 10.0  8.9 - 10.3 mg/dL Final   GFR, Estimated 05/31/2024 >60  >60 mL/min Final   Anion gap 05/31/2024 14  5 - 15 Final   Glucose-Capillary 05/31/2024 369 (H)  70 - 99 mg/dL Final   Comment 1 90/85/7974 Notify RN   Final   Glucose-Capillary 05/31/2024 164 (H)  70 - 99 mg/dL Final   Hgb J8r MFr Bld 06/01/2024 7.7 (H)  4.8 - 5.6 % Final   Mean Plasma Glucose 06/01/2024 174.29  mg/dL Final   Cholesterol 90/84/7974 225 (H)  0 - 200 mg/dL Final   Triglycerides 90/84/7974 130  <150 mg/dL Final   HDL 90/84/7974 79  >40 mg/dL Final   Total CHOL/HDL Ratio 06/01/2024 2.8  RATIO Final   VLDL 06/01/2024 26  0 - 40 mg/dL Final   LDL Cholesterol 06/01/2024 120 (H)  0 - 99 mg/dL Final   Iron 90/84/7974 66  28 - 170 ug/dL Final   TIBC 90/84/7974 406  250 - 450 ug/dL Final   Saturation Ratios 06/01/2024 16  10.4 - 31.8 % Final   UIBC 06/01/2024 341  ug/dL Final   Ferritin 90/84/7974 91  11 - 307 ng/mL Final   Retic Ct Pct 06/01/2024 2.9  0.4 - 3.1 % Final   RBC. 06/01/2024 3.91  3.87 - 5.11 MIL/uL  Final   Retic Count, Absolute 06/01/2024 111.8  19.0 - 186.0 K/uL Final   Immature Retic Fract 06/01/2024 16.7 (H)  2.3 - 15.9 % Final   Glucose-Capillary 05/31/2024 211 (H)  70 - 99 mg/dL Final   Glucose-Capillary 06/01/2024 266 (H)  70 - 99 mg/dL Final   Glucose-Capillary  06/01/2024 323 (H)  70 - 99 mg/dL Final   Glucose-Capillary 06/01/2024 317 (H)  70 - 99 mg/dL Final   Glucose-Capillary 06/01/2024 297 (H)  70 - 99 mg/dL Final   Glucose-Capillary 06/02/2024 188 (H)  70 - 99 mg/dL Final   Glucose-Capillary 06/02/2024 379 (H)  70 - 99 mg/dL Final     Vitals: Blood pressure (!) 152/86, pulse 62, temperature (!) 97.4 F (36.3 C), temperature source Oral, resp. rate 18, height 5' 3 (1.6 m), weight 80 kg, SpO2 99%.    Oliva DELENA Salmon, DO

## 2024-06-02 NOTE — Progress Notes (Signed)
(  Sleep Hours) - 7.5 (Any PRNs that were needed, meds refused, or side effects to meds)- refused lorazepam  0.5 mg at bedtime stating she didn't want anymore benzos; c/o hard stools and asked for colace (Any disturbances and when (visitation, over night)- none (Concerns raised by the patient)- wants to know when she will be discharged (SI/HI/AVH)- denies

## 2024-06-02 NOTE — Progress Notes (Signed)
   06/02/24 0836  Psych Admission Type (Psych Patients Only)  Admission Status Involuntary  Psychosocial Assessment  Patient Complaints Anxiety  Eye Contact Fair  Facial Expression Animated;Anxious  Affect Anxious  Speech Logical/coherent  Interaction Assertive  Motor Activity Slow  Appearance/Hygiene Unremarkable  Behavior Characteristics Appropriate to situation  Mood Anxious;Pleasant  Aggressive Behavior  Effect No apparent injury  Thought Process  Coherency Tangential  Content Preoccupation  Delusions None reported or observed  Perception WDL  Hallucination None reported or observed  Judgment Impaired  Confusion WDL  Danger to Self  Current suicidal ideation? Denies  Agreement Not to Harm Self Yes  Description of Agreement verbal  Danger to Others  Danger to Others None reported or observed

## 2024-06-02 NOTE — Progress Notes (Signed)
 Type of Note:  IVC Court Hearing     Patient participated in the court hearing.   The IVC has been approved for an additional 7 days.     Jaleena Viviani, LCSWA 06/02/2024

## 2024-06-02 NOTE — Group Note (Signed)
 Recreation Therapy Group Note   Group Topic:Health and Wellness  Group Date: 06/02/2024 Start Time: 1034 End Time: 1104 Facilitators: Etana Beets-McCall, LRT,CTRS Location: [address] Dayroom   Group Topic: Wellness  Goal Area(s) Addresses:  Patient will define components of whole wellness. Patient will verbalize benefit of whole wellness.  Behavioral Response:   Intervention: Meditation Music, Guided Imagery  Activity: LRT and patients discussed the importance of wellness and its impact on an individuals overall well-being. LRT and patients went through a series of yoga moves to stretch and engage muscles. LRT then played the mountain meditation. This meditation focused on taking on the aspects/structure of a mountain. In that visualization, patients were to see themselves being able to withstand anything that comes against them just like the mountain can withstand different types of weather or any pricks that may happen to it.   Education: Wellness, Building control surveyor.   Education Outcome: Acknowledges education/In group clarification offered/Needs additional education.    Affect/Mood: N/A   Participation Level: Did not attend    Clinical Observations/Individualized Feedback:      Plan: Continue to engage patient in RT group sessions 2-3x/week.   Albertia Carvin-McCall, LRT,CTRS 06/02/2024 1:41 PM

## 2024-06-03 LAB — GLUCOSE, CAPILLARY
Glucose-Capillary: 165 mg/dL — ABNORMAL HIGH (ref 70–99)
Glucose-Capillary: 328 mg/dL — ABNORMAL HIGH (ref 70–99)
Glucose-Capillary: 293 mg/dL — ABNORMAL HIGH (ref 70–99)
Glucose-Capillary: 304 mg/dL — ABNORMAL HIGH (ref 70–99)

## 2024-06-03 MED ORDER — OLANZAPINE 15 MG PO TBDP
30.0000 mg | ORAL_TABLET | Freq: Every day | ORAL | Status: DC
Start: 2024-06-03 — End: 2024-06-08
  Administered 2024-06-03 – 2024-06-07 (×5): 30 mg via ORAL
  Filled 2024-06-03 (×5): qty 2

## 2024-06-03 MED ORDER — LORAZEPAM 1 MG PO TABS
1.0000 mg | ORAL_TABLET | Freq: Three times a day (TID) | ORAL | Status: DC | PRN
  Administered 2024-06-04: 1 mg via ORAL
  Filled 2024-06-03: qty 1

## 2024-06-03 MED ORDER — ATENOLOL 50 MG PO TABS
100.0000 mg | ORAL_TABLET | Freq: Two times a day (BID) | ORAL | Status: DC
  Administered 2024-06-03 – 2024-06-08 (×9): 100 mg via ORAL
  Filled 2024-06-03 (×10): qty 2

## 2024-06-03 MED ORDER — INSULIN GLARGINE 100 UNITS/ML SOLOSTAR PEN: 15.0000 [IU] | PEN_INJECTOR | Freq: Every day | SUBCUTANEOUS | Status: DC

## 2024-06-03 MED ORDER — INSULIN GLARGINE 100 UNIT/ML ~~LOC~~ SOLN
15.0000 [IU] | Freq: Every day | SUBCUTANEOUS | Status: DC
  Administered 2024-06-03: 15 [IU] via SUBCUTANEOUS
  Filled 2024-06-03: qty 0.15

## 2024-06-03 NOTE — Group Note (Unsigned)
 Date:  06/04/2024 Time:  5:20 AM  Group Topic/Focus:  Wrap-Up Group:   The focus of this group is to help patients review their daily goal of treatment and discuss progress on daily workbooks.    Participation Level:  Minimal  Participation Quality:  Appropriate and Sharing  Affect:  Appropriate and Flat  Cognitive:  Appropriate  Insight: Appropriate and Limited  Engagement in Group:  Engaged and Limited  Modes of Intervention:  Discussion and Socialization  Additional Comments:  Patient shared that she had a good day. Patient shared that she went outside today and walked around. Patient also shared that she colored and read today which was one of her goals. Patient rated her day a 7 out of 10.  Eward Mace 06/04/2024, 5:20 AM

## 2024-06-03 NOTE — Progress Notes (Signed)
(  Sleep Hours) - 4  (Any PRNs that were needed, meds refused, or side effects to meds)- Refused HS Semglee .  (Any disturbances and when (visitation, over night)- None  (Concerns raised by the patient)-  Elevated Blood pressure would like Atenolol  evening dose 100 mg  (SI/HI/AVH)- Denies

## 2024-06-03 NOTE — Plan of Care (Signed)
   Problem: Education: Goal: Emotional status will improve Outcome: Progressing Goal: Mental status will improve Outcome: Progressing Goal: Verbalization of understanding the information provided will improve Outcome: Progressing

## 2024-06-03 NOTE — Progress Notes (Signed)
 [facility] Inpatient Psychiatry Progress Note  Date: 06/03/24 Patient: [name] MRN: [id number]  Assessment and Plan: [name] is a 58 y.o. female admitted involuntarily for acute psychosis, agitation, and mania. Her UDS was negative for tested substances.   9/17 - Continues to be manic and labile. Has been hypertensive lately so will restart home atenolol . Will increase olanzapine  due to ongoing sxs.   # Bipolar I disorder, most recent episode (or current) manic (HCC) - Ongoing mania - Olanzapine  ODT 10/30 mg BID - Depakote  750 mg q12h. Level 73 on 9/13 - lorazepam  1 mg q8h prn   # Hypothyroidism - Synthroid  100 mcg daily - TSH elevated at admission, suspect poor compliance  # DM2 - Sliding scale insulin  - Lantus  15 units nightly  # HTN - Atenolol  100 mg BID    Risk Assessment - High due to decompensated psychosis/acute mania  Discharge Planning Barriers to discharge: ongoing mania Estimated length of stay: 5-10 days Predicted Discharge location: Home     Interval History and update: Patient continues to be manic. Slept about 6 hours last night. Received 16 units of corrective insulin  yesterday. She refused insulin  glargine because it did not say lantus . On assessment she was noticeably labile and dramatic. She perseverated on discharge and needing to go home to see her dogs. She shared again the sequence of events with the police leading to this admission and was overly dramatic in retelling it. She stated multiple times that she does not have schizophrenia and that she only has PTSD. She expressed concern over the swelling in her legs and insists that she needs to restart twice daily atenolol .      Physical Exam MSK/Neuro - Normal gait and station Mental Status Exam Appearance - casually dressed, adequate grooming and hygiene. Legs are edematous Attitude - overly familiar, but guarded Speech - hyperverbal, otherwise  normal Mood - I'm fine Affect - Labile, between nearly crying and laughing Thought Process - tangential, mildly disorganized Thought Content - perseverates on needing to go home to her dogs SI/HI - Denies Perceptions - Denies Judgement/Insight - Grossly impaired Fund of knowledge - WNL Language - No impairments      Lab Results:  Admission on 05/21/2024  Component Date Value Ref Range Status   TSH 05/23/2024 5.380 (H)  0.350 - 4.500 uIU/mL Final   Vitamin B-12 05/23/2024 1,669 (H)  180 - 914 pg/mL Final   Folate 05/23/2024 9.2  >5.9 ng/mL Final   Vit D, 25-Hydroxy 05/23/2024 71.02  30 - 100 ng/mL Final   Valproic Acid  Lvl 05/30/2024 73  50 - 100 ug/mL Final   Color, Urine 05/30/2024 YELLOW  YELLOW Final   APPearance 05/30/2024 HAZY (A)  CLEAR Final   Specific Gravity, Urine 05/30/2024 1.010  1.005 - 1.030 Final   pH 05/30/2024 7.0  5.0 - 8.0 Final   Glucose, UA 05/30/2024 >=500 (A)  NEGATIVE mg/dL Final   Hgb urine dipstick 05/30/2024 NEGATIVE  NEGATIVE Final   Bilirubin Urine 05/30/2024 NEGATIVE  NEGATIVE Final   Ketones, ur 05/30/2024 5 (A)  NEGATIVE mg/dL Final   Protein, ur 90/86/7974 NEGATIVE  NEGATIVE mg/dL Final   Nitrite 90/86/7974 NEGATIVE  NEGATIVE Final   Leukocytes,Ua 05/30/2024 LARGE (A)  NEGATIVE Final   RBC / HPF 05/30/2024 6-10  0 - 5 RBC/hpf Final   WBC, UA 05/30/2024 >50  0 - 5 WBC/hpf Final   Bacteria, UA 05/30/2024 RARE (A)  NONE SEEN Final   Squamous  Epithelial / HPF 05/30/2024 6-10  0 - 5 /HPF Final   Non Squamous Epithelial 05/30/2024 0-5 (A)  NONE SEEN Final   Free T4 05/31/2024 0.96  0.61 - 1.12 ng/dL Final   WBC 90/85/7974 3.8 (L)  4.0 - 10.5 K/uL Final   RBC 05/31/2024 3.80 (L)  3.87 - 5.11 MIL/uL Final   Hemoglobin 05/31/2024 10.8 (L)  12.0 - 15.0 g/dL Final   HCT 90/85/7974 33.6 (L)  36.0 - 46.0 % Final   MCV 05/31/2024 88.4  80.0 - 100.0 fL Final   MCH 05/31/2024 28.4  26.0 - 34.0 pg Final   MCHC 05/31/2024 32.1  30.0 - 36.0 g/dL Final    RDW 90/85/7974 15.2  11.5 - 15.5 % Final   Platelets 05/31/2024 160  150 - 400 K/uL Final   nRBC 05/31/2024 0.0  0.0 - 0.2 % Final   Neutrophils Relative % 05/31/2024 64  % Final   Neutro Abs 05/31/2024 2.4  1.7 - 7.7 K/uL Final   Lymphocytes Relative 05/31/2024 18  % Final   Lymphs Abs 05/31/2024 0.7  0.7 - 4.0 K/uL Final   Monocytes Relative 05/31/2024 12  % Final   Monocytes Absolute 05/31/2024 0.5  0.1 - 1.0 K/uL Final   Eosinophils Relative 05/31/2024 4  % Final   Eosinophils Absolute 05/31/2024 0.1  0.0 - 0.5 K/uL Final   Basophils Relative 05/31/2024 1  % Final   Basophils Absolute 05/31/2024 0.0  0.0 - 0.1 K/uL Final   Immature Granulocytes 05/31/2024 1  % Final   Abs Immature Granulocytes 05/31/2024 0.03  0.00 - 0.07 K/uL Final   Sodium 05/31/2024 135  135 - 145 mmol/L Final   Potassium 05/31/2024 4.6  3.5 - 5.1 mmol/L Final   Chloride 05/31/2024 95 (L)  98 - 111 mmol/L Final   CO2 05/31/2024 26  22 - 32 mmol/L Final   Glucose, Bld 05/31/2024 398 (H)  70 - 99 mg/dL Final   BUN 90/85/7974 13  6 - 20 mg/dL Final   Creatinine, Ser 05/31/2024 0.92  0.44 - 1.00 mg/dL Final   Calcium  05/31/2024 10.0  8.9 - 10.3 mg/dL Final   GFR, Estimated 05/31/2024 >60  >60 mL/min Final   Anion gap 05/31/2024 14  5 - 15 Final   Glucose-Capillary 05/31/2024 369 (H)  70 - 99 mg/dL Final   Comment 1 90/85/7974 Notify RN   Final   Glucose-Capillary 05/31/2024 164 (H)  70 - 99 mg/dL Final   Hgb J8r MFr Bld 06/01/2024 7.7 (H)  4.8 - 5.6 % Final   Mean Plasma Glucose 06/01/2024 174.29  mg/dL Final   Cholesterol 90/84/7974 225 (H)  0 - 200 mg/dL Final   Triglycerides 90/84/7974 130  <150 mg/dL Final   HDL 90/84/7974 79  >40 mg/dL Final   Total CHOL/HDL Ratio 06/01/2024 2.8  RATIO Final   VLDL 06/01/2024 26  0 - 40 mg/dL Final   LDL Cholesterol 06/01/2024 120 (H)  0 - 99 mg/dL Final   Iron 90/84/7974 66  28 - 170 ug/dL Final   TIBC 90/84/7974 406  250 - 450 ug/dL Final   Saturation Ratios 06/01/2024  16  10.4 - 31.8 % Final   UIBC 06/01/2024 341  ug/dL Final   Ferritin 90/84/7974 91  11 - 307 ng/mL Final   Retic Ct Pct 06/01/2024 2.9  0.4 - 3.1 % Final   RBC. 06/01/2024 3.91  3.87 - 5.11 MIL/uL Final   Retic Count, Absolute 06/01/2024 111.8  19.0 - 186.0 K/uL Final   Immature Retic Fract 06/01/2024 16.7 (H)  2.3 - 15.9 % Final   Glucose-Capillary 05/31/2024 211 (H)  70 - 99 mg/dL Final   Glucose-Capillary 06/01/2024 266 (H)  70 - 99 mg/dL Final   Glucose-Capillary 06/01/2024 323 (H)  70 - 99 mg/dL Final   Glucose-Capillary 06/01/2024 317 (H)  70 - 99 mg/dL Final   Glucose-Capillary 06/01/2024 297 (H)  70 - 99 mg/dL Final   Glucose-Capillary 06/02/2024 188 (H)  70 - 99 mg/dL Final   Glucose-Capillary 06/02/2024 379 (H)  70 - 99 mg/dL Final   Glucose-Capillary 06/02/2024 300 (H)  70 - 99 mg/dL Final   Glucose-Capillary 06/02/2024 234 (H)  70 - 99 mg/dL Final   Glucose-Capillary 06/03/2024 165 (H)  70 - 99 mg/dL Final   Comment 1 90/82/7974 Notify RN   Final   Glucose-Capillary 06/03/2024 328 (H)  70 - 99 mg/dL Final     Vitals: Blood pressure (!) 163/85, pulse (!) 57, temperature 98.3 F (36.8 C), temperature source Oral, resp. rate 18, height 5' 3 (1.6 m), weight 80 kg, SpO2 100%.    Oliva DELENA Salmon, DO

## 2024-06-03 NOTE — Inpatient Diabetes Management (Signed)
 Inpatient Diabetes Program Recommendations  AACE/ADA: New Consensus Statement on Inpatient Glycemic Control (2015)  Target Ranges:  Prepandial:   less than 140 mg/dL      Peak postprandial:   less than 180 mg/dL (1-2 hours)      Critically ill patients:  140 - 180 mg/dL    Latest Reference Range & Units 06/02/24 06:25 06/02/24 12:47 06/02/24 17:14 06/02/24 19:51  Glucose-Capillary 70 - 99 mg/dL 811 (H)  2 units Novolog   379 (H)  9 units Novolog   300 (H)  5 units Novolog   234 (H)   Refused Lantus  insulin   (H): Data is abnormally high  Latest Reference Range & Units 06/03/24 06:08 06/03/24 12:03  Glucose-Capillary 70 - 99 mg/dL 834 (H)  2 units Novolog   328 (H)  7 units Novolog    (H): Data is abnormally high       Home DM Meds: Lantus  26 units every day     Novolog  10-30 units TID     Freestyle Libre 3 CGM  Current Orders: Lantus  15 units at bedtime     Novolog  Sensitive Correction Scale/ SSI (0-9 units) TID AC    MD- Please note pt refused Lantus  last PM.  Would not adjust Lantus  further today.  May consider adding Novolog  Meal Coverage:  Novolog  3 units TID with meals HOLD if pt NPO HOLD if pt eats <50% meals     --Will follow patient during hospitalization--  Adina Rudolpho Arrow RN, MSN, CDCES Diabetes Coordinator Inpatient Glycemic Control Team Team Pager: [phone] (8a-5p)

## 2024-06-03 NOTE — Group Note (Signed)
 Recreation Therapy Group Note   Group Topic:Leisure Education  Group Date: 06/03/2024 Start Time: 1010 End Time: 1035 Facilitators: Wendle Kina-McCall, LRT,CTRS Location: [address] Dayroom   Group Topic: Leisure Education  Goal Area(s) Addresses:  Patient will identify positive leisure activities for use post discharge. Patient will identify at least one positive benefit of participation in leisure activities.  Patient will work effectively work with peers to keep the ball in play.  Behavioral Response: None   Intervention: [employer], Music   Activity: Patients were to sit in a circle. Patients would toss a beach ball to each other keeping the ball in motion. LRT would time the group to see how long they could keep the ball moving. Patients could bounce or roll the ball but it could not come to a stop. If the ball were to stop moving, LRT would start the timer over.  Education:  Leisure Scientist, physiological, Special educational needs teacher, Teamwork, Discharge Planning  Education Outcome: Acknowledges education/In group clarification offered/Needs additional education.    Affect/Mood: Appropriate   Participation Level: None   Participation Quality: None   Behavior: On-looking   Speech/Thought Process: Rational   Insight: Fair   Judgement: Fair    Modes of Intervention: Cooperative Play   Patient Response to Interventions:  Attentive   Education Outcome:  In group clarification offered    Clinical Observations/Individualized Feedback: Pt didn't participate in activity. Pt danced and sang to the music that played during group session.     Plan: Continue to engage patient in RT group sessions 2-3x/week.   Kendall Justo-McCall, LRT,CTRS 06/03/2024 1:26 PM

## 2024-06-03 NOTE — Progress Notes (Signed)
   06/03/24 0831  Psych Admission Type (Psych Patients Only)  Admission Status Involuntary  Psychosocial Assessment  Patient Complaints Anxiety  Eye Contact Fair  Facial Expression Animated;Anxious  Affect Preoccupied  Speech Pressured  Interaction Intrusive  Motor Activity Slow  Appearance/Hygiene Layered clothes  Behavior Characteristics Cooperative;Anxious  Mood Anxious  Thought Process  Coherency Tangential  Content Preoccupation  Delusions None reported or observed  Perception WDL  Hallucination None reported or observed  Judgment Impaired  Confusion None  Danger to Self  Current suicidal ideation? Denies  Agreement Not to Harm Self Yes  Description of Agreement Verbal  Danger to Others  Danger to Others None reported or observed

## 2024-06-03 NOTE — Progress Notes (Signed)
   06/02/24 1945 06/02/24 2130 06/03/24 0614  Vital Signs  Temp  --   --  98.3 F (36.8 C)  Temp Source  --   --  Oral  Pulse Rate 74 76 (!) 57  Pulse Rate Source Monitor Monitor  --   Resp 18 16  --   BP (!) 192/90 (!) 152/88 (!) 163/85  BP Location Right Arm Left Arm Right Arm  BP Method Automatic Manual Automatic  Patient Position (if appropriate) Standing Sitting Sitting   Patient BP elevated am Atenolol  100 mg given. Patient requesting another dose of 100 mg of atenolol  stating ' I take 100 mg in the morning and 100 mg in the evening at home to keep my blood pressure normal Support provided.

## 2024-06-04 LAB — GLUCOSE, CAPILLARY
Glucose-Capillary: 208 mg/dL — ABNORMAL HIGH (ref 70–99)
Glucose-Capillary: 295 mg/dL — ABNORMAL HIGH (ref 70–99)
Glucose-Capillary: 302 mg/dL — ABNORMAL HIGH (ref 70–99)
Glucose-Capillary: 339 mg/dL — ABNORMAL HIGH (ref 70–99)

## 2024-06-04 MED ORDER — INSULIN GLARGINE 100 UNIT/ML ~~LOC~~ SOLN
18.0000 [IU] | Freq: Every day | SUBCUTANEOUS | Status: DC
  Administered 2024-06-04: 18 [IU] via SUBCUTANEOUS
  Filled 2024-06-04 (×2): qty 0.18

## 2024-06-04 MED ORDER — INSULIN GLARGINE-YFGN 100 UNIT/ML ~~LOC~~ SOPN: 18.0000 [IU] | PEN_INJECTOR | Freq: Every day | SUBCUTANEOUS | Status: DC

## 2024-06-04 MED ORDER — FUROSEMIDE 20 MG PO TABS
10.0000 mg | ORAL_TABLET | Freq: Every day | ORAL | Status: DC
  Administered 2024-06-04 – 2024-06-05 (×2): 10 mg via ORAL
  Filled 2024-06-04 (×2): qty 1

## 2024-06-04 NOTE — Inpatient Diabetes Management (Signed)
 Inpatient Diabetes Program Recommendations  AACE/ADA: New Consensus Statement on Inpatient Glycemic Control (2015)  Target Ranges:  Prepandial:   less than 140 mg/dL      Peak postprandial:   less than 180 mg/dL (1-2 hours)      Critically ill patients:  140 - 180 mg/dL    Latest Reference Range & Units 06/03/24 06:08 06/03/24 12:03 06/03/24 16:38 06/03/24 20:28  Glucose-Capillary 70 - 99 mg/dL 834 (H)  2 units Novolog   328 (H)  7 units Novolog   293 (H)  5 units Novolog   304 (H)   15 units Lantus   (H): Data is abnormally high  Latest Reference Range & Units 06/04/24 06:09  Glucose-Capillary 70 - 99 mg/dL 791 (H)  3 units Novolog    (H): Data is abnormally high   Home DM Meds: Lantus  26 units every day     Novolog  10-30 units TID     Freestyle Libre 3 CGM   Current Orders: Lantus  15 units at bedtime                           Novolog  Sensitive Correction Scale/ SSI (0-9 units) TID AC       MD- Please consider:  1. Increase Lantus  slightly to 18 units at HS  2. Start Novolog  Meal Coverage:  Novolog  4 units TID with meals HOLD if pt NPO HOLD if pt eats <50% meals    --Will follow patient during hospitalization--  Adina Rudolpho Arrow RN, MSN, CDCES Diabetes Coordinator Inpatient Glycemic Control Team Team Pager: [phone] (8a-5p)

## 2024-06-04 NOTE — [facility] Group Notes (Signed)
 Adult Psychoeducational Group Note  Date:  06/04/2024 Time:  9:21 PM  Group Topic/Focus:  Wrap-Up Group:   The focus of this group is to help patients review their daily goal of treatment and discuss progress on daily workbooks.  Participation Level:  Active  Participation Quality:  Appropriate  Affect:  Appropriate  Cognitive:  Appropriate  Insight: Appropriate  Engagement in Group:  Engaged  Modes of Intervention:  Discussion  Additional Comments:  Pt told that today was a good day on the unit, the highlights of which were listening to music and feeling joyful. On the subject of short term goals, Pt mentioned only wanting to be discharged home. Pt rated her day a 10 out of 10.  Khoa Opdahl Lee 06/04/2024, 9:21 PM

## 2024-06-04 NOTE — Plan of Care (Signed)
   Problem: Education: Goal: Knowledge of [place] [facility] Education information/materials will improve Outcome: Progressing Goal: Emotional status will improve Outcome: Progressing Goal: Mental status will improve Outcome: Progressing Goal: Verbalization of understanding the information provided will improve Outcome: Progressing

## 2024-06-04 NOTE — Progress Notes (Signed)
(  Sleep Hours) -4.25  (Any PRNs that were needed, meds refused, or side effects to meds)- Vistaril  25 mg, Ativan  1 mg  (Any disturbances and when (visitation, over night)-none  (Concerns raised by the patient)- none  (SI/HI/AVH)-denies

## 2024-06-04 NOTE — Progress Notes (Signed)
(  Sleep Hours) - 7 (Any PRNs that were needed, meds refused, or side effects to meds)- none (Any disturbances and when (visitation, over night)- none (Concerns raised by the patient)- ankle swelling -pt asked for lasix . Advised none ordered (SI/HI/AVH)- denies

## 2024-06-04 NOTE — Group Note (Deleted)
 Date:  06/04/2024 Time:  8:25 PM  Group Topic/Focus:  Wrap-Up Group:   The focus of this group is to help patients review their daily goal of treatment and discuss progress on daily workbooks.     Participation Level:  {[facility] PARTICIPATION OZCZO:77735}  Participation Quality:  {[facility] PARTICIPATION QUALITY:22265}  Affect:  {[facility] AFFECT:22266}  Cognitive:  {[facility] COGNITIVE:22267}  Insight: {B[facility] Insight2:20797}  Engagement in Group:  {[facility] ENGAGEMENT IN HMNLE:77731}  Modes of Intervention:  {[facility] MODES OF INTERVENTION:22269}  Additional Comments:  ***  [name] 06/04/2024, 8:25 PM

## 2024-06-04 NOTE — Progress Notes (Signed)
 D: Patient is alert, intrusive, preoccupied, pleasant, and cooperative. Denies SI, HI, AVH, and verbally contracts for safety. Patient reports she slept good last night without sleeping medication. Patient reports her appetite as good, energy level as high, and concentration as good. Patient rates her depression 0/10, hopelessness 0/10, and anxiety 1/10. Patient denies physical symptoms/pain.    A: Scheduled medications administered per MD order. Support provided. Patient educated on safety on the unit and medications. Routine safety checks every 15 minutes. Patient stated understanding to tell nurse about any new physical symptoms. Patient understands to tell staff of any needs.     R: No adverse drug reactions noted. Patient remains safe at this time and will continue to monitor.    06/04/24 1000  Psych Admission Type (Psych Patients Only)  Admission Status Involuntary  Psychosocial Assessment  Patient Complaints Anxiety  Eye Contact Fair  Facial Expression Animated;Anxious  Affect Preoccupied  Speech Pressured  Interaction Intrusive  Motor Activity Slow  Appearance/Hygiene Unremarkable  Behavior Characteristics Cooperative;Anxious  Mood Anxious  Thought Process  Coherency Tangential  Content Preoccupation  Delusions None reported or observed  Perception WDL  Hallucination None reported or observed  Judgment Impaired  Confusion None  Danger to Self  Current suicidal ideation? Denies  Agreement Not to Harm Self Yes  Description of Agreement verbal  Danger to Others  Danger to Others None reported or observed

## 2024-06-04 NOTE — Plan of Care (Signed)
   Problem: Education: Goal: Emotional status will improve Outcome: Progressing Goal: Mental status will improve Outcome: Progressing   Problem: Activity: Goal: Interest or engagement in activities will improve Outcome: Progressing Goal: Sleeping patterns will improve Outcome: Progressing

## 2024-06-04 NOTE — Plan of Care (Signed)

## 2024-06-04 NOTE — Progress Notes (Signed)
 [facility] Inpatient Psychiatry Progress Note  Date: 06/04/24 Patient: [name] MRN: [id number]  Assessment and Plan: ALIANYS CHACKO is a 58 y.o. female admitted involuntarily for acute psychosis, agitation, and mania. Her UDS was negative for tested substances.   9/18 - Further mild improvement. Patient continues to present as labile, though decreasing in intensity, but with ongoing very poor insight. Questionable compliance after discharge. She is likely approaching discharge. Sometime likely within the next 3-5 days.   # Bipolar I disorder, most recent episode (or current) manic (HCC) - Olanzapine  ODT 10/30 mg BID - Depakote  750 mg q12h. Level 73 on 9/13 - lorazepam  1 mg q8h prn   # Hypothyroidism - Synthroid  100 mcg daily - TSH elevated at admission, suspect poor compliance  # DM2 - Sliding scale insulin  - Insulin  glargine 18 units nightly  # HTN - Atenolol  100 mg BID    Risk Assessment - Moderate  Discharge Planning Barriers to discharge: ongoing mania Estimated length of stay: 2-5 days days Predicted Discharge location: Home     Interval History and update: No significant events overnight. Patient has been med compliant. She slept for about 7 hours last night. She continues to report LE swelling and requested lasix  today. On assessment she continues to insist that she is fine and that tells me that today she reached 100% oxygen saturation for the first time in my life! She continues to insist that she is not experiencing any symptoms and that she needs to return home to be with her dogs because they are defecating all over the floor. She tells me that they rejiggered my meds and I'm feeling great. It's all verifiable with the established facts. She denies AVH.     Physical Exam MSK/Neuro - Normal gait and station Mental Status Exam Appearance - casually dressed, adequate grooming and hygiene. Legs are edematous Attitude - overly  familiar, but guarded Speech - hyperverbal, otherwise normal Mood - I'm fine Affect - Labile, between nearly crying and laughing Thought Process - tangential, mildly disorganized Thought Content - perseverates on needing to go home to her dogs SI/HI - Denies Perceptions - Denies Judgement/Insight - Grossly impaired Fund of knowledge - WNL Language - No impairments      Lab Results:  Admission on 05/21/2024  Component Date Value Ref Range Status   TSH 05/23/2024 5.380 (H)  0.350 - 4.500 uIU/mL Final   Vitamin B-12 05/23/2024 1,669 (H)  180 - 914 pg/mL Final   Folate 05/23/2024 9.2  >5.9 ng/mL Final   Vit D, 25-Hydroxy 05/23/2024 71.02  30 - 100 ng/mL Final   Valproic Acid  Lvl 05/30/2024 73  50 - 100 ug/mL Final   Color, Urine 05/30/2024 YELLOW  YELLOW Final   APPearance 05/30/2024 HAZY (A)  CLEAR Final   Specific Gravity, Urine 05/30/2024 1.010  1.005 - 1.030 Final   pH 05/30/2024 7.0  5.0 - 8.0 Final   Glucose, UA 05/30/2024 >=500 (A)  NEGATIVE mg/dL Final   Hgb urine dipstick 05/30/2024 NEGATIVE  NEGATIVE Final   Bilirubin Urine 05/30/2024 NEGATIVE  NEGATIVE Final   Ketones, ur 05/30/2024 5 (A)  NEGATIVE mg/dL Final   Protein, ur 90/86/7974 NEGATIVE  NEGATIVE mg/dL Final   Nitrite 90/86/7974 NEGATIVE  NEGATIVE Final   Leukocytes,Ua 05/30/2024 LARGE (A)  NEGATIVE Final   RBC / HPF 05/30/2024 6-10  0 - 5 RBC/hpf Final   WBC, UA 05/30/2024 >50  0 - 5 WBC/hpf Final   Bacteria, UA 05/30/2024  RARE (A)  NONE SEEN Final   Squamous Epithelial / HPF 05/30/2024 6-10  0 - 5 /HPF Final   Non Squamous Epithelial 05/30/2024 0-5 (A)  NONE SEEN Final   Free T4 05/31/2024 0.96  0.61 - 1.12 ng/dL Final   WBC 90/85/7974 3.8 (L)  4.0 - 10.5 K/uL Final   RBC 05/31/2024 3.80 (L)  3.87 - 5.11 MIL/uL Final   Hemoglobin 05/31/2024 10.8 (L)  12.0 - 15.0 g/dL Final   HCT 90/85/7974 33.6 (L)  36.0 - 46.0 % Final   MCV 05/31/2024 88.4  80.0 - 100.0 fL Final   MCH 05/31/2024 28.4  26.0 - 34.0 pg  Final   MCHC 05/31/2024 32.1  30.0 - 36.0 g/dL Final   RDW 90/85/7974 15.2  11.5 - 15.5 % Final   Platelets 05/31/2024 160  150 - 400 K/uL Final   nRBC 05/31/2024 0.0  0.0 - 0.2 % Final   Neutrophils Relative % 05/31/2024 64  % Final   Neutro Abs 05/31/2024 2.4  1.7 - 7.7 K/uL Final   Lymphocytes Relative 05/31/2024 18  % Final   Lymphs Abs 05/31/2024 0.7  0.7 - 4.0 K/uL Final   Monocytes Relative 05/31/2024 12  % Final   Monocytes Absolute 05/31/2024 0.5  0.1 - 1.0 K/uL Final   Eosinophils Relative 05/31/2024 4  % Final   Eosinophils Absolute 05/31/2024 0.1  0.0 - 0.5 K/uL Final   Basophils Relative 05/31/2024 1  % Final   Basophils Absolute 05/31/2024 0.0  0.0 - 0.1 K/uL Final   Immature Granulocytes 05/31/2024 1  % Final   Abs Immature Granulocytes 05/31/2024 0.03  0.00 - 0.07 K/uL Final   Sodium 05/31/2024 135  135 - 145 mmol/L Final   Potassium 05/31/2024 4.6  3.5 - 5.1 mmol/L Final   Chloride 05/31/2024 95 (L)  98 - 111 mmol/L Final   CO2 05/31/2024 26  22 - 32 mmol/L Final   Glucose, Bld 05/31/2024 398 (H)  70 - 99 mg/dL Final   BUN 90/85/7974 13  6 - 20 mg/dL Final   Creatinine, Ser 05/31/2024 0.92  0.44 - 1.00 mg/dL Final   Calcium  05/31/2024 10.0  8.9 - 10.3 mg/dL Final   GFR, Estimated 05/31/2024 >60  >60 mL/min Final   Anion gap 05/31/2024 14  5 - 15 Final   Glucose-Capillary 05/31/2024 369 (H)  70 - 99 mg/dL Final   Comment 1 90/85/7974 Notify RN   Final   Glucose-Capillary 05/31/2024 164 (H)  70 - 99 mg/dL Final   Hgb J8r MFr Bld 06/01/2024 7.7 (H)  4.8 - 5.6 % Final   Mean Plasma Glucose 06/01/2024 174.29  mg/dL Final   Cholesterol 90/84/7974 225 (H)  0 - 200 mg/dL Final   Triglycerides 90/84/7974 130  <150 mg/dL Final   HDL 90/84/7974 79  >40 mg/dL Final   Total CHOL/HDL Ratio 06/01/2024 2.8  RATIO Final   VLDL 06/01/2024 26  0 - 40 mg/dL Final   LDL Cholesterol 06/01/2024 120 (H)  0 - 99 mg/dL Final   Iron 90/84/7974 66  28 - 170 ug/dL Final   TIBC 90/84/7974  406  250 - 450 ug/dL Final   Saturation Ratios 06/01/2024 16  10.4 - 31.8 % Final   UIBC 06/01/2024 341  ug/dL Final   Ferritin 90/84/7974 91  11 - 307 ng/mL Final   Retic Ct Pct 06/01/2024 2.9  0.4 - 3.1 % Final   RBC. 06/01/2024 3.91  3.87 - 5.11 MIL/uL  Final   Retic Count, Absolute 06/01/2024 111.8  19.0 - 186.0 K/uL Final   Immature Retic Fract 06/01/2024 16.7 (H)  2.3 - 15.9 % Final   Glucose-Capillary 05/31/2024 211 (H)  70 - 99 mg/dL Final   Glucose-Capillary 06/01/2024 266 (H)  70 - 99 mg/dL Final   Glucose-Capillary 06/01/2024 323 (H)  70 - 99 mg/dL Final   Glucose-Capillary 06/01/2024 317 (H)  70 - 99 mg/dL Final   Glucose-Capillary 06/01/2024 297 (H)  70 - 99 mg/dL Final   Glucose-Capillary 06/02/2024 188 (H)  70 - 99 mg/dL Final   Glucose-Capillary 06/02/2024 379 (H)  70 - 99 mg/dL Final   Glucose-Capillary 06/02/2024 300 (H)  70 - 99 mg/dL Final   Glucose-Capillary 06/02/2024 234 (H)  70 - 99 mg/dL Final   Glucose-Capillary 06/03/2024 165 (H)  70 - 99 mg/dL Final   Comment 1 90/82/7974 Notify RN   Final   Glucose-Capillary 06/03/2024 328 (H)  70 - 99 mg/dL Final   Glucose-Capillary 06/03/2024 293 (H)  70 - 99 mg/dL Final   Glucose-Capillary 06/03/2024 304 (H)  70 - 99 mg/dL Final   Glucose-Capillary 06/04/2024 208 (H)  70 - 99 mg/dL Final   Glucose-Capillary 06/04/2024 295 (H)  70 - 99 mg/dL Final   Glucose-Capillary 06/04/2024 302 (H)  70 - 99 mg/dL Final     Vitals: Blood pressure (!) 128/55, pulse (!) 55, temperature 97.9 F (36.6 C), temperature source Oral, resp. rate 16, height 5' 3 (1.6 m), weight 80 kg, SpO2 100%.    Oliva DELENA Salmon, DO

## 2024-06-04 NOTE — Group Note (Signed)
 Recreation Therapy Group Note   Group Topic:Other  Group Date: 06/04/2024 Start Time: 1005 End Time: 1043 Facilitators: Davidlee Jeanbaptiste-McCall, LRT,CTRS Location: [address]   Group Topic/Focus: Self Expression   Goal Area(s) Addresses:  Patient will share their idea of self expression. Patient will be able to identify a variety of ways one can express themself. Patient will successfully share why it is beneficial to express yourself.  Behavioral Response: Engaged  Intervention: Music  Activity:  Patients were able to select songs they felt helped them express/show their emotions. Patients could sing a long or dance to the songs as they played during group. Patients were allowed to any song as long as it was clean and appropriate.   Education: Communication, Investment banker, corporate Outcome: Acknowledges education, Self Expression   Affect/Mood: Appropriate   Participation Level: Engaged   Participation Quality: Independent   Behavior: Hyperverbal and Interactive    Speech/Thought Process: Relevant and Rational   Insight: Good   Judgement: Good   Modes of Intervention: Music   Patient Response to Interventions:  Engaged   Education Outcome:  In group clarification offered    Clinical Observations/Individualized Feedback: Pt was bright and social with peers. Pt had moments were she would talk too much but was able to be redirected. Pt would sing a long with and dance to the songs that were played. It appeared pt knew every song played in group. Pt was also appeared to be motherly to one of her peers who gravitated to her. Pt was appropriate, despite being too hyper-verbal at times.      Plan: Continue to engage patient in RT group sessions 2-3x/week.   [name], LRT,CTRS 06/04/2024 11:18 AM

## 2024-06-04 NOTE — [facility] Group Notes (Signed)
 Adult Psychoeducational Group Note  Date:  06/04/2024 Time:  4:15 PM  Group Topic/Focus:  Goals Group:   The focus of this group is to help patients establish daily goals to achieve during treatment and discuss how the patient can incorporate goal setting into their daily lives to aide in recovery. Orientation:   The focus of this group is to educate the patient on the purpose and policies of crisis stabilization and provide a format to answer questions about their admission.  The group details unit policies and expectations of patients while admitted.  Participation Level:  Active  Participation Quality:  Appropriate  Affect:  Appropriate  Cognitive:  Appropriate  Insight: Appropriate  Engagement in Group:  Engaged  Modes of Intervention:  Discussion  Additional Comments:  Pt attended the goals group and remained appropriate and engaged throughout the duration of the group.   Gabriellah Rabel O 06/04/2024, 4:15 PM

## 2024-06-05 ENCOUNTER — Encounter (HOSPITAL_COMMUNITY): Payer: Self-pay

## 2024-06-05 LAB — GLUCOSE, CAPILLARY
Glucose-Capillary: 181 mg/dL — ABNORMAL HIGH (ref 70–99)
Glucose-Capillary: 324 mg/dL — ABNORMAL HIGH (ref 70–99)
Glucose-Capillary: 281 mg/dL — ABNORMAL HIGH (ref 70–99)
Glucose-Capillary: 244 mg/dL — ABNORMAL HIGH (ref 70–99)

## 2024-06-05 MED ORDER — FUROSEMIDE 20 MG PO TABS
20.0000 mg | ORAL_TABLET | Freq: Every day | ORAL | Status: DC
Start: 2024-06-06 — End: 2024-06-08
  Administered 2024-06-06 – 2024-06-08 (×3): 20 mg via ORAL
  Filled 2024-06-05 (×3): qty 1

## 2024-06-05 MED ORDER — INSULIN GLARGINE 100 UNIT/ML ~~LOC~~ SOLN
22.0000 [IU] | Freq: Every day | SUBCUTANEOUS | Status: DC
  Administered 2024-06-05: 22 [IU] via SUBCUTANEOUS
  Filled 2024-06-05 (×2): qty 0.22

## 2024-06-05 MED ORDER — INSULIN ASPART 100 UNIT/ML IJ SOLN
4.0000 [IU] | Freq: Three times a day (TID) | INTRAMUSCULAR | Status: DC
  Administered 2024-06-05 – 2024-06-08 (×8): 4 [IU] via SUBCUTANEOUS

## 2024-06-05 NOTE — Progress Notes (Signed)
   06/05/24 2000  Psych Admission Type (Psych Patients Only)  Admission Status Involuntary  Psychosocial Assessment  Patient Complaints None  Eye Contact Fair  Facial Expression Animated  Affect Appropriate to circumstance  Speech Pressured  Interaction Assertive  Motor Activity Slow  Appearance/Hygiene Unremarkable  Behavior Characteristics Cooperative  Mood Pleasant  Aggressive Behavior  Effect No apparent injury  Thought Process  Coherency WDL  Content WDL  Delusions None reported or observed  Perception WDL  Hallucination None reported or observed  Judgment Limited  Confusion None  Danger to Self  Current suicidal ideation? Denies  Danger to Others  Danger to Others None reported or observed

## 2024-06-05 NOTE — Group Note (Signed)
 Recreation Therapy Group Note   Group Topic:Coping Skills  Group Date: 06/05/2024 Start Time: 1020 End Time: 1046 Facilitators: Junetta Hearn-McCall, LRT,CTRS Location: [address] Day[address]   Group Topic: Coping Skills   Goal Area(s) Addresses: Patient will define what a coping skill is. Patient will work to create a list of healthy coping skills beginning with each letter of the alphabet. Patient will successfully identify positive coping skills they can use post d/c.  Patient will acknowledge benefit(s) of using learned coping skills post d/c.  Behavioral Response: Active   Intervention: Group work   Activity: Coping A to Z. Patient asked to identify what a coping skill is and when they use them. Patients with Clinical research associate discussed healthy versus unhealthy coping skills. Next patients were given a blank worksheet titled Coping Skills A-Z. Partners were instructed to come up with at least one positive coping skill per letter of the alphabet. Patients were given 15 minutes to brainstorm before ideas were presented to the large group. Patients and LRT debriefed on the importance of coping skill selection based on situation and back-up plans when a skill tried is not effective. At the end of group, patients were given an handout of alphabetized strategies to keep for future reference.   Education: Pharmacologist, Scientist, physiological, Discharge Planning.    Education Outcome: Acknowledges education/Verbalizes understanding/In group clarification offered/Additional education needed   Affect/Mood: Appropriate   Participation Level: Active   Participation Quality: Independent   Behavior: Appropriate and Cooperative   Speech/Thought Process: Relevant   Insight: Fair   Judgement: Fair    Modes of Intervention: Worksheet   Patient Response to Interventions:  Receptive   Education Outcome:  In group clarification offered    Clinical Observations/Individualized Feedback: Pt was more  attentive and active. Pt stayed more on topic and followed instructions. Pt identified some of her coping skills as uplifting yourself, vocalizing your feelings and question everything.      Plan: Continue to engage patient in RT group sessions 2-3x/week.   Everrett Lacasse-McCall, LRT,CTRS 06/05/2024 1:18 PM

## 2024-06-05 NOTE — Progress Notes (Signed)
 [facility] Inpatient Psychiatry Progress Note  Date: 06/05/24 Patient: [name] MRN: [id number]  Assessment and Plan: [name] is a 58 y.o. female admitted involuntarily for acute psychosis, agitation, and mania. Her UDS was negative for tested substances.   9/19 - Less overtly manic today. Continues to improve. She provided consent to finally allow staff to contact her family. Progressing. Potential DC Monday.   # Bipolar I disorder, most recent episode (or current) manic (HCC) - Olanzapine  ODT 10/30 mg BID - Depakote  750 mg q12h. Level 73 on 9/13 - lorazepam  1 mg q8h prn   # Hypothyroidism - Synthroid  100 mcg daily - TSH elevated at admission, suspect poor compliance  # DM2 - Sliding scale insulin  - Insulin  glargine 18 units nightly  # HTN - Atenolol  100 mg BID - Laxis 20 mg daily    Risk Assessment - Moderate  Discharge Planning Barriers to discharge: ongoing mania Estimated length of stay: 3-4 days Predicted Discharge location: Home     Interval History and update: No significant events overnight. Patient has been fixating more on discharge. He has been taking some of her prescribed insulin  but her BS levels remain elevated. She did take her prescribed insulin  glargine however. She has been napping throughout the day. She continues to insist on getting back on my atenolol  but she has been receiving this. She continues to report LE swelling. She slept for 4.25 hours last night. She has been compliant with her prescribed medications. Today she reported positive improvement since starting lasix  and feels that the leg swelling has decreased. She also stated that she feels that her medications have been properly adjusted to address her symptoms and she feels that she is now getting better and plans to be adherent after discharge.      Physical Exam MSK/Neuro - Normal gait and station Mental Status Exam Appearance - casually dressed,  adequate grooming and hygiene. Legs are edematous Attitude - overly familiar, but guarded Speech - hyperverbal, otherwise normal Mood - good Affect - Bright to euphoric Thought Process - tangential, mildly disorganized Thought Content - no grossly delusional TC expressed SI/HI - Denies Perceptions - Denies Judgement/Insight - Grossly impaired Fund of knowledge - WNL Language - No impairments      Lab Results:  Admission on 05/21/2024  Component Date Value Ref Range Status   TSH 05/23/2024 5.380 (H)  0.350 - 4.500 uIU/mL Final   Vitamin B-12 05/23/2024 1,669 (H)  180 - 914 pg/mL Final   Folate 05/23/2024 9.2  >5.9 ng/mL Final   Vit D, 25-Hydroxy 05/23/2024 71.02  30 - 100 ng/mL Final   Valproic Acid  Lvl 05/30/2024 73  50 - 100 ug/mL Final   Color, Urine 05/30/2024 YELLOW  YELLOW Final   APPearance 05/30/2024 HAZY (A)  CLEAR Final   Specific Gravity, Urine 05/30/2024 1.010  1.005 - 1.030 Final   pH 05/30/2024 7.0  5.0 - 8.0 Final   Glucose, UA 05/30/2024 >=500 (A)  NEGATIVE mg/dL Final   Hgb urine dipstick 05/30/2024 NEGATIVE  NEGATIVE Final   Bilirubin Urine 05/30/2024 NEGATIVE  NEGATIVE Final   Ketones, ur 05/30/2024 5 (A)  NEGATIVE mg/dL Final   Protein, ur 90/86/7974 NEGATIVE  NEGATIVE mg/dL Final   Nitrite 90/86/7974 NEGATIVE  NEGATIVE Final   Leukocytes,Ua 05/30/2024 LARGE (A)  NEGATIVE Final   RBC / HPF 05/30/2024 6-10  0 - 5 RBC/hpf Final   WBC, UA 05/30/2024 >50  0 - 5 WBC/hpf Final  Bacteria, UA 05/30/2024 RARE (A)  NONE SEEN Final   Squamous Epithelial / HPF 05/30/2024 6-10  0 - 5 /HPF Final   Non Squamous Epithelial 05/30/2024 0-5 (A)  NONE SEEN Final   Free T4 05/31/2024 0.96  0.61 - 1.12 ng/dL Final   WBC 90/85/7974 3.8 (L)  4.0 - 10.5 K/uL Final   RBC 05/31/2024 3.80 (L)  3.87 - 5.11 MIL/uL Final   Hemoglobin 05/31/2024 10.8 (L)  12.0 - 15.0 g/dL Final   HCT 90/85/7974 33.6 (L)  36.0 - 46.0 % Final   MCV 05/31/2024 88.4  80.0 - 100.0 fL Final   MCH  05/31/2024 28.4  26.0 - 34.0 pg Final   MCHC 05/31/2024 32.1  30.0 - 36.0 g/dL Final   RDW 90/85/7974 15.2  11.5 - 15.5 % Final   Platelets 05/31/2024 160  150 - 400 K/uL Final   nRBC 05/31/2024 0.0  0.0 - 0.2 % Final   Neutrophils Relative % 05/31/2024 64  % Final   Neutro Abs 05/31/2024 2.4  1.7 - 7.7 K/uL Final   Lymphocytes Relative 05/31/2024 18  % Final   Lymphs Abs 05/31/2024 0.7  0.7 - 4.0 K/uL Final   Monocytes Relative 05/31/2024 12  % Final   Monocytes Absolute 05/31/2024 0.5  0.1 - 1.0 K/uL Final   Eosinophils Relative 05/31/2024 4  % Final   Eosinophils Absolute 05/31/2024 0.1  0.0 - 0.5 K/uL Final   Basophils Relative 05/31/2024 1  % Final   Basophils Absolute 05/31/2024 0.0  0.0 - 0.1 K/uL Final   Immature Granulocytes 05/31/2024 1  % Final   Abs Immature Granulocytes 05/31/2024 0.03  0.00 - 0.07 K/uL Final   Sodium 05/31/2024 135  135 - 145 mmol/L Final   Potassium 05/31/2024 4.6  3.5 - 5.1 mmol/L Final   Chloride 05/31/2024 95 (L)  98 - 111 mmol/L Final   CO2 05/31/2024 26  22 - 32 mmol/L Final   Glucose, Bld 05/31/2024 398 (H)  70 - 99 mg/dL Final   BUN 90/85/7974 13  6 - 20 mg/dL Final   Creatinine, Ser 05/31/2024 0.92  0.44 - 1.00 mg/dL Final   Calcium  05/31/2024 10.0  8.9 - 10.3 mg/dL Final   GFR, Estimated 05/31/2024 >60  >60 mL/min Final   Anion gap 05/31/2024 14  5 - 15 Final   Glucose-Capillary 05/31/2024 369 (H)  70 - 99 mg/dL Final   Comment 1 90/85/7974 Notify RN   Final   Glucose-Capillary 05/31/2024 164 (H)  70 - 99 mg/dL Final   Hgb J8r MFr Bld 06/01/2024 7.7 (H)  4.8 - 5.6 % Final   Mean Plasma Glucose 06/01/2024 174.29  mg/dL Final   Cholesterol 90/84/7974 225 (H)  0 - 200 mg/dL Final   Triglycerides 90/84/7974 130  <150 mg/dL Final   HDL 90/84/7974 79  >40 mg/dL Final   Total CHOL/HDL Ratio 06/01/2024 2.8  RATIO Final   VLDL 06/01/2024 26  0 - 40 mg/dL Final   LDL Cholesterol 06/01/2024 120 (H)  0 - 99 mg/dL Final   Iron 90/84/7974 66  28 - 170  ug/dL Final   TIBC 90/84/7974 406  250 - 450 ug/dL Final   Saturation Ratios 06/01/2024 16  10.4 - 31.8 % Final   UIBC 06/01/2024 341  ug/dL Final   Ferritin 90/84/7974 91  11 - 307 ng/mL Final   Retic Ct Pct 06/01/2024 2.9  0.4 - 3.1 % Final   RBC. 06/01/2024 3.91  3.87 -  5.11 MIL/uL Final   Retic Count, Absolute 06/01/2024 111.8  19.0 - 186.0 K/uL Final   Immature Retic Fract 06/01/2024 16.7 (H)  2.3 - 15.9 % Final   Glucose-Capillary 05/31/2024 211 (H)  70 - 99 mg/dL Final   Glucose-Capillary 06/01/2024 266 (H)  70 - 99 mg/dL Final   Glucose-Capillary 06/01/2024 323 (H)  70 - 99 mg/dL Final   Glucose-Capillary 06/01/2024 317 (H)  70 - 99 mg/dL Final   Glucose-Capillary 06/01/2024 297 (H)  70 - 99 mg/dL Final   Glucose-Capillary 06/02/2024 188 (H)  70 - 99 mg/dL Final   Glucose-Capillary 06/02/2024 379 (H)  70 - 99 mg/dL Final   Glucose-Capillary 06/02/2024 300 (H)  70 - 99 mg/dL Final   Glucose-Capillary 06/02/2024 234 (H)  70 - 99 mg/dL Final   Glucose-Capillary 06/03/2024 165 (H)  70 - 99 mg/dL Final   Comment 1 90/82/7974 Notify RN   Final   Glucose-Capillary 06/03/2024 328 (H)  70 - 99 mg/dL Final   Glucose-Capillary 06/03/2024 293 (H)  70 - 99 mg/dL Final   Glucose-Capillary 06/03/2024 304 (H)  70 - 99 mg/dL Final   Glucose-Capillary 06/04/2024 208 (H)  70 - 99 mg/dL Final   Glucose-Capillary 06/04/2024 295 (H)  70 - 99 mg/dL Final   Glucose-Capillary 06/04/2024 302 (H)  70 - 99 mg/dL Final   Glucose-Capillary 06/04/2024 339 (H)  70 - 99 mg/dL Final   Comment 1 90/81/7974 Notify RN   Final   Glucose-Capillary 06/05/2024 181 (H)  70 - 99 mg/dL Final   Comment 1 90/80/7974 Notify RN   Final   Glucose-Capillary 06/05/2024 324 (H)  70 - 99 mg/dL Final     Vitals: Blood pressure (!) 112/90, pulse 60, temperature (!) 97.4 F (36.3 C), temperature source Oral, resp. rate 16, height 5' 3 (1.6 m), weight 80 kg, SpO2 100%.    Oliva DELENA Salmon, DO

## 2024-06-05 NOTE — Inpatient Diabetes Management (Signed)
 Inpatient Diabetes Program Recommendations  AACE/ADA: New Consensus Statement on Inpatient Glycemic Control   Target Ranges:  Prepandial:   less than 140 mg/dL      Peak postprandial:   less than 180 mg/dL (1-2 hours)      Critically ill patients:  140 - 180 mg/dL    Latest Reference Range & Units 06/04/24 06:09 06/04/24 11:57 06/04/24 17:09 06/04/24 20:04 06/05/24 06:02  Glucose-Capillary 70 - 99 mg/dL 791 (H) 704 (H) 697 (H) 339 (H) 181 (H)   Review of Glycemic Control  Diabetes history: DM2 Outpatient Diabetes medications: Lantus  26 units daily, Novolog  10-30 units TID with meals, FreeStyle Libre 3 CGM Current orders for Inpatient glycemic control: Lantus  18 units at bedtime, Novolog  0-9 units TID with meals  Inpatient Diabetes Program Recommendations:    Insulin : Post prandial glucose consistently elevated.  Please consider ordering Novolog  4 units TID with meals for meal coverage if patient eats at least 50% of meals.  Thanks, Earnie Gainer, RN, MSN, CDCES Diabetes Coordinator Inpatient Diabetes Program [phone] (Team Pager from 8am to 5pm)

## 2024-06-05 NOTE — Progress Notes (Signed)
   06/05/24 0743  Psych Admission Type (Psych Patients Only)  Admission Status Involuntary  Psychosocial Assessment  Patient Complaints None  Eye Contact Fair  Facial Expression Animated  Affect Appropriate to circumstance  Speech Pressured  Interaction Assertive  Motor Activity Slow  Appearance/Hygiene Unremarkable  Behavior Characteristics Cooperative  Mood Pleasant  Thought Process  Coherency WDL  Content WDL  Delusions None reported or observed  Perception WDL  Hallucination None reported or observed  Judgment Limited  Confusion None  Danger to Self  Current suicidal ideation? Denies  Agreement Not to Harm Self Yes  Description of Agreement verbal  Danger to Others  Danger to Others None reported or observed

## 2024-06-05 NOTE — [facility] Group Notes (Signed)
 Spirituality Group   Description: Participant directed exploration of values, beliefs and meaning   Following a brief framework of chaplain's role and ground rules of group behavior, participants are invited to share concerns or questions that engage spiritual life. Emphasis placed on common themes and shared experiences and ways to make meaning and clarify living into one's values.   Theory/Process/Goal: Utilize the theoretical framework of group therapy established by Celena Kite, Relational Cultural Theory and Rogerian approaches to facilitate relational empathy and use of the "here and now" to foster reflection, self-awareness, and sharing.   Observations: [name] was an active participant in the group discussion. She was positive and engaged peers with better capacity to regulate self and listen to others.  [name].Div

## 2024-06-05 NOTE — Group Note (Signed)
 Date:  06/05/2024 Time:  8:45 PM  Group Topic/Focus:  Wrap-Up Group:   The focus of this group is to help patients review their daily goal of treatment and discuss progress on daily workbooks.    Participation Level:  Active  Participation Quality:  Appropriate  Affect:  Appropriate  Cognitive:  Appropriate  Insight: Appropriate  Engagement in Group:  Engaged  Modes of Intervention:  Education and Exploration  Additional Comments:  Patient attended and participated in group tonight. She reports that today she learn that the interaction of all the medication needs to be right, not just the mental health medication.  [name] 06/05/2024, 8:45 PM

## 2024-06-05 NOTE — [facility] Group Notes (Signed)
 Adult Psychoeducational Group Note  Date:  06/05/2024 Time:  7:42 PM  Group Topic/Focus:  Goals Group:   The focus of this group is to help patients establish daily goals to achieve during treatment and discuss how the patient can incorporate goal setting into their daily lives to aide in recovery. Orientation:   The focus of this group is to educate the patient on the purpose and policies of crisis stabilization and provide a format to answer questions about their admission.  The group details unit policies and expectations of patients while admitted.  Participation Level:  Active  Participation Quality:  Appropriate  Affect:  Appropriate  Cognitive:  Appropriate  Insight: Appropriate  Engagement in Group:  Engaged  Modes of Intervention:  Discussion  Additional Comments:  Pt attended the goals group and remained appropriate and engaged throughout the duration of the group.   Sunny Gains O 06/05/2024, 7:42 PM

## 2024-06-05 NOTE — [facility] Suicide Risk Assessment (Signed)
 [facility] INPATIENT:  Family/Significant Other Suicide Prevention Education  Suicide Prevention Education:  Education Completed; Elna (dad) [phone],  (name of family member/significant other) has been identified by the patient as the family member/significant other with whom the patient will be residing, and identified as the person(s) who will aid the patient in the event of a mental health crisis (suicidal ideations/suicide attempt).  With written consent from the patient, the family member/significant other has been provided the following suicide prevention education, prior to the and/or following the discharge of the patient.  Dad said that patient has her own home in [place].  Dad said patient doesn't have any guns or weapons.    Dad said that he lives in [place] , and regularly calls patient.  Dad said that patient's friend visits her in the hospital every night.   Dad said that patient is definitely doing better but she is not 100% there yet.  Dad said that patient was happy that her medications where adjusted because they haven't been adjusted in 25 years.    Dad said that patient is intelligent, likes reading and is independent.  The suicide prevention education provided includes the following: Suicide risk factors Suicide prevention and interventions National Suicide Hotline telephone number [facility] assessment telephone number [facility] Emergency Assistance 911 [facility] and/or Residential Mobile Crisis Unit telephone number  Request made of family/significant other to: Remove weapons (e.g., guns, rifles, knives), all items previously/currently identified as safety concern.   Remove drugs/medications (over-the-counter, prescriptions, illicit drugs), all items previously/currently identified as a safety concern.  The family member/significant other verbalizes understanding of the suicide prevention education information provided.  The family  member/significant other agrees to remove the items of safety concern listed above.  [name], LCSWA 06/05/2024, 3:56 PM

## 2024-06-05 NOTE — BH IP Treatment Plan (Signed)
 Interdisciplinary Treatment and Diagnostic Plan Update  06/05/2024 Time of Session: 9:15am - UPDATE [name] MRN: [id number]  Principal Diagnosis: Bipolar I disorder, most recent episode (or current) manic (HCC)  Secondary Diagnoses: Principal Problem:   Bipolar I disorder, most recent episode (or current) manic (HCC) Active Problems:   Psychosis (HCC)   Current Medications:  Current Facility-Administered Medications  Medication Dose Route Frequency Provider Last Rate Last Admin   acetaminophen  (TYLENOL ) tablet 650 mg  650 mg Oral Q6H PRN Randall, Veronique M, NP       albuterol  (VENTOLIN  HFA) 108 (90 Base) MCG/ACT inhaler 2 puff  2 puff Inhalation Q4H PRN Ntuen, Tina C, FNP       alum & mag hydroxide-simeth (MAALOX/MYLANTA) 200-200-20 MG/5ML suspension 30 mL  30 mL Oral Q4H PRN Randall, Veronique M, NP       atenolol  (TENORMIN ) tablet 100 mg  100 mg Oral BID Prentis Kitchens A, DO   100 mg at 06/05/24 9256   diphenhydrAMINE -zinc  acetate (BENADRYL ) 2-0.1 % cream   Topical TID PRN Bobbitt, Shalon E, NP       divalproex  (DEPAKOTE ) DR tablet 750 mg  750 mg Oral Q12H Ji, Andrew, MD   750 mg at 06/05/24 0743   docusate sodium  (COLACE) capsule 100 mg  100 mg Oral Daily PRN Onuoha, Chinwendu V, NP   100 mg at 06/02/24 9362   furosemide  (LASIX ) tablet 10 mg  10 mg Oral Daily Prentis Kitchens A, DO   10 mg at 06/05/24 0742   hydrOXYzine  (ATARAX ) tablet 25 mg  25 mg Oral TID PRN Randall Starlyn HERO, NP   25 mg at 06/04/24 2354   insulin  aspart (novoLOG ) injection 0-9 Units  0-9 Units Subcutaneous TID WC Lynnette Barter, MD   2 Units at 06/05/24 9372   insulin  glargine (LANTUS ) injection 18 Units  18 Units Subcutaneous QHS Utomwen, Adesuwa, RPH   18 Units at 06/04/24 2111   irbesartan  (AVAPRO ) tablet 75 mg  75 mg Oral Daily Ntuen, Tina C, FNP   75 mg at 06/05/24 0743   levothyroxine  (SYNTHROID ) tablet 100 mcg  100 mcg Oral Q0600 Ntuen, Tina C, FNP   100 mcg at 06/05/24 9383   LORazepam  (ATIVAN )  tablet 1 mg  1 mg Oral Q8H PRN Prentis Kitchens A, DO   1 mg at 06/04/24 2354   OLANZapine  zydis (ZYPREXA ) disintegrating tablet 5 mg  5 mg Oral Q6H PRN Prentis Kitchens A, DO   5 mg at 05/24/24 1057   Or   OLANZapine  (ZYPREXA ) injection 5 mg  5 mg Intramuscular Q6H PRN Prentis Kitchens A, DO       OLANZapine  zydis (ZYPREXA ) disintegrating tablet 10 mg  10 mg Oral Daily Prentis Kitchens A, DO   10 mg at 06/05/24 9256   OLANZapine  zydis (ZYPREXA ) disintegrating tablet 30 mg  30 mg Oral QHS Bouchard, Marc A, DO   30 mg at 06/04/24 2053   traZODone  (DESYREL ) tablet 50 mg  50 mg Oral QHS PRN Randall Starlyn HERO, NP       zolpidem  (AMBIEN ) tablet 5 mg  5 mg Oral QHS Ji, Andrew, MD   5 mg at 06/04/24 2053   PTA Medications: Medications Prior to Admission  Medication Sig Dispense Refill Last Dose/Taking   albuterol  (VENTOLIN  HFA) 108 (90 Base) MCG/ACT inhaler Inhale 2 puffs into the lungs every 4 (four) hours as needed for wheezing or shortness of breath. 18 g 5 Taking As Needed   atenolol  (TENORMIN ) 100  MG tablet TAKE 1 TABLET(100 MG) BY MOUTH TWICE DAILY 180 tablet 3 Taking   clonazePAM  (KLONOPIN ) 0.5 MG tablet Take 0.5 tablets (0.25 mg total) by mouth daily. 15 tablet 1 Taking   Digestive Enzymes (ENZYME DIGEST PO) Take by mouth.   Taking   fexofenadine  (ALLEGRA ) 60 MG tablet Take 120 mg by mouth 2 (two) times daily.   Taking   irbesartan  (AVAPRO ) 75 MG tablet TAKE 1 TABLET(75 MG) BY MOUTH DAILY 90 tablet 3 Taking   LANTUS  SOLOSTAR 100 UNIT/ML Solostar Pen Inject 26 Units into the skin daily. 45 mL 1 Taking   levothyroxine  (SYNTHROID ) 100 MCG tablet TAKE 1 TABLET(100 MCG) BY MOUTH DAILY 90 tablet 3 Taking   methocarbamol  (ROBAXIN ) 500 MG tablet Take 500 mg by mouth every 8 (eight) hours as needed for muscle spasms.   Taking As Needed   NOVOLOG  FLEXPEN 100 UNIT/ML FlexPen Inject 10-30 Units into the skin 3 (three) times daily with meals. 90 mL 2 Taking   Olopatadine HCl (PATADAY OP) Apply to eye.   Taking    OVER THE COUNTER MEDICATION 1 capsule daily. Probiotic renew life    Taking   sertraline  (ZOLOFT ) 100 MG tablet TAKE 2 TABLETS BY MOUTH EVERY DAY 60 tablet 1 Taking   Blood Glucose Monitoring Suppl (ONE TOUCH ULTRA MINI) w/Device KIT Use to check blood sugar 2 times a day. 1 kit 0    Blood Glucose Monitoring Suppl (ONETOUCH VERIO) w/Device KIT Use as instructed to check blood sugar 4X daily 1 kit 0    Continuous Glucose Sensor (FREESTYLE LIBRE 3 PLUS SENSOR) MISC Inject 1 Device into the skin continuous. Change every 15 days 6 each 1    glucose blood (ONETOUCH VERIO) test strip Use as instructed to check blood sugar 4X daily 300 each 2    Insulin  Pen Needle (BD PEN NEEDLE NANO 2ND GEN) 32G X 4 MM MISC USE 4 TIMES DAILY 400 each 3     Patient Stressors: Other: God Brought me here    Patient Strengths: Religious Affiliation   Treatment Modalities: Medication Management, Group therapy, Case management,  1 to 1 session with clinician, Psychoeducation, Recreational therapy.   Physician Treatment Plan for Primary Diagnosis: Bipolar I disorder, most recent episode (or current) manic (HCC) Long Term Goal(s): Improvement in symptoms so as ready for discharge   Short Term Goals: Ability to identify changes in lifestyle to reduce recurrence of condition will improve Ability to verbalize feelings will improve Ability to disclose and discuss suicidal ideas Ability to demonstrate self-control will improve Ability to identify and develop effective coping behaviors will improve Ability to maintain clinical measurements within normal limits will improve Compliance with prescribed medications will improve Ability to identify triggers associated with substance abuse/mental health issues will improve  Medication Management: Evaluate patient's response, side effects, and tolerance of medication regimen.  Therapeutic Interventions: 1 to 1 sessions, Unit Group sessions and Medication  administration.  Evaluation of Outcomes: Progressing  Physician Treatment Plan for Secondary Diagnosis: Principal Problem:   Bipolar I disorder, most recent episode (or current) manic (HCC) Active Problems:   Psychosis (HCC)  Long Term Goal(s): Improvement in symptoms so as ready for discharge   Short Term Goals: Ability to identify changes in lifestyle to reduce recurrence of condition will improve Ability to verbalize feelings will improve Ability to disclose and discuss suicidal ideas Ability to demonstrate self-control will improve Ability to identify and develop effective coping behaviors will improve Ability to maintain clinical measurements  within normal limits will improve Compliance with prescribed medications will improve Ability to identify triggers associated with substance abuse/mental health issues will improve     Medication Management: Evaluate patient's response, side effects, and tolerance of medication regimen.  Therapeutic Interventions: 1 to 1 sessions, Unit Group sessions and Medication administration.  Evaluation of Outcomes: Progressing   RN Treatment Plan for Primary Diagnosis: Bipolar I disorder, most recent episode (or current) manic (HCC) Long Term Goal(s): Knowledge of disease and therapeutic regimen to maintain health will improve  Short Term Goals: Ability to remain free from injury will improve, Ability to verbalize frustration and anger appropriately will improve, Ability to demonstrate self-control, Ability to participate in decision making will improve, Ability to verbalize feelings will improve, and Ability to disclose and discuss suicidal ideas  Medication Management: RN will administer medications as ordered by provider, will assess and evaluate patient's response and provide education to patient for prescribed medication. RN will report any adverse and/or side effects to prescribing provider.  Therapeutic Interventions: 1 on 1 counseling  sessions, Psychoeducation, Medication administration, Evaluate responses to treatment, Monitor vital signs and CBGs as ordered, Perform/monitor CIWA, COWS, AIMS and Fall Risk screenings as ordered, Perform wound care treatments as ordered.  Evaluation of Outcomes: Progressing   [name] Treatment Plan for Primary Diagnosis: Bipolar I disorder, most recent episode (or current) manic (HCC) Long Term Goal(s): Safe transition to appropriate next level of care at discharge, Engage patient in therapeutic group addressing interpersonal concerns.  Short Term Goals: Engage patient in aftercare planning with referrals and resources, Increase social support, Increase ability to appropriately verbalize feelings, Increase emotional regulation, Facilitate acceptance of mental health diagnosis and concerns, and Increase skills for wellness and recovery  Therapeutic Interventions: Assess for all discharge needs, 1 to 1 time with Social worker, Explore available resources and support systems, Assess for adequacy in community support network, Educate family and significant other(s) on suicide prevention, Complete Psychosocial Assessment, Interpersonal group therapy.  Evaluation of Outcomes: Progressing   Progress in Treatment: Attending groups: attended some groups Participating in groups: Yes Taking medication as prescribed: Yes. Toleration medication: Yes. Family/Significant other contact made: patient declined consents Patient understands diagnosis: No. Discussing patient identified problems/goals with staff: No. Medical problems stabilized or resolved: Yes. Denies suicidal/homicidal ideation: Yes. Issues/concerns per patient self-inventory: No.   New problem(s) identified:  No   New Short Term/Long Term Goal(s):     medication stabilization, elimination of SI thoughts, development of comprehensive mental wellness plan.      Patient Goals: I feel like I was forced to come here, and I didn't come by  choice.    Discharge Plan or Barriers:  Patient recently admitted. CSW will continue to follow and assess for appropriate referrals and possible discharge planning.      Reason for Continuation of Hospitalization: Delusions  Suicidal ideation Paranoia   Estimated Length of Stay:  3 - 4 days  Last 3 [place] Suicide Severity Risk Score: Flowsheet Row [facility] (Current) [facility] from 05/20/2024 in [facility] at [facility] Office Visit from 11/28/2023 in [facility] at [facility]  C-SSRS RISK CATEGORY No Risk No Risk No Risk    Last PHQ 2/9 Scores:    08/17/2022   10:41 AM 08/17/2022   10:35 AM 01/08/2022    2:06 PM  Depression screen PHQ 2/9  Decreased Interest 0 0 0  Down, Depressed, Hopeless 1 1  0  PHQ - 2 Score 1 1 0  Altered sleeping 1    Tired, decreased energy 1    Change in appetite 0    Feeling bad or failure about yourself  0    Trouble concentrating 1    Moving slowly or fidgety/restless 0    Suicidal thoughts 0    PHQ-9 Score 4    Difficult doing work/chores Somewhat difficult      Scribe for Treatment Team: Milanya Sunderland M Chuck Caban, [place] 06/05/2024 8:24 AM

## 2024-06-06 LAB — GLUCOSE, CAPILLARY
Glucose-Capillary: 160 mg/dL — ABNORMAL HIGH (ref 70–99)
Glucose-Capillary: 254 mg/dL — ABNORMAL HIGH (ref 70–99)
Glucose-Capillary: 181 mg/dL — ABNORMAL HIGH (ref 70–99)
Glucose-Capillary: 212 mg/dL — ABNORMAL HIGH (ref 70–99)

## 2024-06-06 MED ORDER — SIMETHICONE 80 MG PO CHEW: 80.0000 mg | CHEWABLE_TABLET | Freq: Four times a day (QID) | ORAL | Status: DC | PRN

## 2024-06-06 MED ORDER — INSULIN GLARGINE 100 UNIT/ML ~~LOC~~ SOLN
25.0000 [IU] | Freq: Every day | SUBCUTANEOUS | Status: DC
  Administered 2024-06-06: 25 [IU] via SUBCUTANEOUS
  Filled 2024-06-06 (×2): qty 0.25

## 2024-06-06 NOTE — Group Note (Signed)
 [name] Group Therapy Note  Group Date: 06/06/2024 Start Time: 1045 End Time: 1145   Type of Therapy and Topic:  Group Therapy: Using I Statements  Participation Level:  Active  Description of Group:  Patients were asked to provide details of some interpersonal conflicts they have experienced. Patients were then educated about "I" statements, communication which focuses on feelings or views of the speaker rather than what the other person is doing. T group members were asked to reflect on past conflicts and to provide specific examples for utilizing "I" statements.  Therapeutic Goals:  Patients will verbalize understanding of ineffective communication and effective communication. Patients will be able to empathize with whom they are having conflict. Patients will practice effective communication in the form of "I" statements.    Summary of Patient Progress:  [name] shared her yoga technique routines to move her body and decrease stress, while understanding she cannot solve everything rather trust a higher power. The patient was present and active throughout the session and proved open to feedback from CSW and peers. Patient demonstrated good insight into the subject matter, was respectful of peers, and was present throughout the entire session.  Therapeutic Modalities:   Cognitive Behavioral Therapy Solution-Focused Therapy  Locklyn's I statement to promote emotional wellness was I will breathe when I'm stressed.   Hunter JONELLE Lever, [place] 06/06/2024  5:39 PM

## 2024-06-06 NOTE — BHH Group Notes (Signed)
 Adult Psychoeducational Group Note  Date:  06/06/2024 Time:  7:50 PM  Group Topic/Focus:  Goals Group:   The focus of this group is to help patients establish daily goals to achieve during treatment and discuss how the patient can incorporate goal setting into their daily lives to aide in recovery. Orientation:   The focus of this group is to educate the patient on the purpose and policies of crisis stabilization and provide a format to answer questions about their admission.  The group details unit policies and expectations of patients while admitted.  Participation Level:  Active  Participation Quality:  Appropriate  Affect:  Appropriate  Cognitive:  Appropriate  Insight: Appropriate  Engagement in Group:  Engaged  Modes of Intervention:  Discussion  Additional Comments:  Pt attended the goals group and remained appropriate and engaged throughout the duration of the group.   Jontue Crumpacker O 06/06/2024, 7:50 PM

## 2024-06-06 NOTE — Group Note (Signed)
 Date:  06/06/2024 Time:  8:41 PM  Group Topic/Focus:  Wrap-Up Group:   The focus of this group is to help patients review their daily goal of treatment and discuss progress on daily workbooks.    Participation Level:  Active  Participation Quality:  Appropriate  Affect:  Appropriate  Cognitive:  Appropriate  Insight: Appropriate  Engagement in Group:  Engaged  Modes of Intervention:  Education and Exploration  Additional Comments:  Patient attended and participated in group tonight. She reports her goal for today was to get prepare for discharge.  She did.  Gwenn Chillington Dacosta 06/06/2024, 8:41 PM

## 2024-06-06 NOTE — Progress Notes (Signed)
   06/06/24 2302  Psych Admission Type (Psych Patients Only)  Admission Status Involuntary  Psychosocial Assessment  Patient Complaints Anxiety  Eye Contact Fair  Facial Expression Animated;Anxious  Affect Appropriate to circumstance  Speech Pressured  Interaction Assertive  Motor Activity Slow  Appearance/Hygiene Unremarkable  Behavior Characteristics Cooperative  Mood Anxious;Pleasant  Aggressive Behavior  Effect No apparent injury  Thought Process  Coherency WDL  Content WDL  Delusions None reported or observed  Perception WDL  Hallucination None reported or observed  Judgment Limited  Confusion None  Danger to Self  Current suicidal ideation? Denies  Danger to Others  Danger to Others None reported or observed

## 2024-06-06 NOTE — Group Note (Signed)
 [facility] [name] Group Therapy Note   Group Date: 06/06/2024 Start Time: 1000 End Time: 1100   Type of Therapy/Topic:  Group Therapy:  Mindfulness Participation Level:  Active  Mood:  Description of Group:    The purpose of this group is to assist patients to understand mindfulness in learning how to being in the present moment. Patients will be guided to discuss ways in which they have been aware of their thoughts, feelings, and sensation. Also, the patient will be educated on acceptance, noticing their experience without judging or trying to change it. The patient will ask to participate in mindfulness taste with using skittles. Special emphasis will be placed on what the patients would miss if they are not mindful, and patients will process healthy conflict resolution skills.  Therapeutic Goals: Patient will identify an environment where mindful can be useful to them.  Patient will label two or more mindfulness techniques that use.  Patient will be able to demonstrate mindfulness skills through discussion.  Summary of Patient Progress: The patient is pleasant and engage in group. The patient share that she crochet and listen to music for mindfulness Therapeutic Modalities:   Cognitive Behavioral Therapy Feelings Identification Dialectical Behavioral Therapy   Donabelle Molden O Leiyah Maultsby, LCSWA

## 2024-06-06 NOTE — Progress Notes (Signed)
(  Sleep Hours) -7 (Any PRNs that were needed, meds refused, or side effects to meds)- none (Any disturbances and when (visitation, over night)-none (Concerns raised by the patient)- pt wants to know when she is discharging (SI/HI/AVH)-denied

## 2024-06-06 NOTE — Plan of Care (Signed)
  Problem: Education: Goal: Knowledge of [place] Education information/materials will improve Outcome: Progressing Goal: Emotional status will improve Outcome: Progressing Goal: Mental status will improve Outcome: Progressing Goal: Verbalization of understanding the information provided will improve Outcome: Progressing   Problem: Activity: Goal: Interest or engagement in activities will improve Outcome: Progressing Goal: Sleeping patterns will improve Outcome: Progressing   Problem: Coping: Goal: Ability to verbalize frustrations and anger appropriately will improve Outcome: Progressing Goal: Ability to demonstrate self-control will improve Outcome: Progressing   Problem: Health Behavior/Discharge Planning: Goal: Identification of resources available to assist in meeting health care needs will improve Outcome: Progressing Goal: Compliance with treatment plan for underlying cause of condition will improve Outcome: Progressing   Problem: Physical Regulation: Goal: Ability to maintain clinical measurements within normal limits will improve Outcome: Progressing   Problem: Safety: Goal: Periods of time without injury will increase Outcome: Progressing   Problem: Education: Goal: Ability to describe self-care measures that may prevent or decrease complications (Diabetes Survival Skills Education) will improve Outcome: Progressing Goal: Individualized Educational Video(s) Outcome: Progressing   Problem: Coping: Goal: Ability to adjust to condition or change in health will improve Outcome: Progressing   Problem: Fluid Volume: Goal: Ability to maintain a balanced intake and output will improve Outcome: Progressing   Problem: Health Behavior/Discharge Planning: Goal: Ability to identify and utilize available resources and services will improve Outcome: Progressing Goal: Ability to manage health-related needs will improve Outcome: Progressing   Problem:  Metabolic: Goal: Ability to maintain appropriate glucose levels will improve Outcome: Progressing   Problem: Nutritional: Goal: Maintenance of adequate nutrition will improve Outcome: Progressing Goal: Progress toward achieving an optimal weight will improve Outcome: Progressing   Problem: Skin Integrity: Goal: Risk for impaired skin integrity will decrease Outcome: Progressing   Problem: Tissue Perfusion: Goal: Adequacy of tissue perfusion will improve Outcome: Progressing

## 2024-06-06 NOTE — Progress Notes (Signed)
 [name] Inpatient Psychiatry Progress Note  Date: 06/06/24 Patient: [name] MRN: [id number]  Assessment and Plan: BRITTNYE JOSEPHS is a 58 y.o. female admitted involuntarily for acute psychosis, agitation, and mania. Her UDS was negative for tested substances.   9/20 - Decrease in led edema. Increasing furosemide  to 20 mg daily. Increase insulin  glargine to 25 units nightly. Continue other meds. Plan remains for DC Monday.   # Bipolar I disorder, most recent episode (or current) manic (HCC) - Olanzapine  ODT 10/30 mg BID - Depakote  750 mg q12h. Level 73 on 9/13 - lorazepam  1 mg q8h prn   # Hypothyroidism - Synthroid  100 mcg daily - TSH elevated at admission, suspect poor compliance  # DM2 - Sliding scale insulin  - Insulin  glargine 25 units nightly  # HTN - Atenolol  100 mg BID - Laxis 20 mg daily    Risk Assessment - Moderate  Discharge Planning Barriers to discharge: ongoing mania Estimated length of stay: 3-4 days Predicted Discharge location: Home     Interval History and update: No significant events overnight. Patient has been med compliant. Leg swelling appears further reduced but pitting still present on the right. She slept 7 hours last night. This morning she was bright, intrusive, and talkative. She stated that she has been in frequent contact with her ailing father in [place]. [place] and that she needs to DC today to fly down there and nurse him back to health. She is insistent that she will be compliant with her prescribed medications as they have been helpful, but she does want to go off the zolpidem  because it makes her feel groggy in the mornings. BS levels remain elevated. She received 18 units of corrective insulin  yesterday.      Physical Exam MSK/Neuro - Normal gait and station Mental Status Exam Appearance - casually dressed, adequate grooming and hygiene. Legs are edematous Attitude - overly familiar, but  guarded Speech - hyperverbal, otherwise normal Mood - Fine Affect - Bright to euphoric Thought Process - tangential, mildly disorganized Thought Content - perseverates on needing to leave today to take care of her father in [place]. [place] SI/HI - Denies Perceptions - Denies Judgement/Insight - Grossly impaired Fund of knowledge - WNL Language - No impairments      Lab Results:  Admission on 05/21/2024  Component Date Value Ref Range Status   TSH 05/23/2024 5.380 (H)  0.350 - 4.500 uIU/mL Final   Vitamin B-12 05/23/2024 1,669 (H)  180 - 914 pg/mL Final   Folate 05/23/2024 9.2  >5.9 ng/mL Final   Vit D, 25-Hydroxy 05/23/2024 71.02  30 - 100 ng/mL Final   Valproic Acid  Lvl 05/30/2024 73  50 - 100 ug/mL Final   Color, Urine 05/30/2024 YELLOW  YELLOW Final   APPearance 05/30/2024 HAZY (A)  CLEAR Final   Specific Gravity, Urine 05/30/2024 1.010  1.005 - 1.030 Final   pH 05/30/2024 7.0  5.0 - 8.0 Final   Glucose, UA 05/30/2024 >=500 (A)  NEGATIVE mg/dL Final   Hgb urine dipstick 05/30/2024 NEGATIVE  NEGATIVE Final   Bilirubin Urine 05/30/2024 NEGATIVE  NEGATIVE Final   Ketones, ur 05/30/2024 5 (A)  NEGATIVE mg/dL Final   Protein, ur 90/86/7974 NEGATIVE  NEGATIVE mg/dL Final   Nitrite 90/86/7974 NEGATIVE  NEGATIVE Final   Leukocytes,Ua 05/30/2024 LARGE (A)  NEGATIVE Final   RBC / HPF 05/30/2024 6-10  0 - 5 RBC/hpf Final   WBC, UA 05/30/2024 >50  0 - 5 WBC/hpf Final  Bacteria, UA 05/30/2024 RARE (A)  NONE SEEN Final   Squamous Epithelial / HPF 05/30/2024 6-10  0 - 5 /HPF Final   Non Squamous Epithelial 05/30/2024 0-5 (A)  NONE SEEN Final   Free T4 05/31/2024 0.96  0.61 - 1.12 ng/dL Final   WBC 90/85/7974 3.8 (L)  4.0 - 10.5 K/uL Final   RBC 05/31/2024 3.80 (L)  3.87 - 5.11 MIL/uL Final   Hemoglobin 05/31/2024 10.8 (L)  12.0 - 15.0 g/dL Final   HCT 90/85/7974 33.6 (L)  36.0 - 46.0 % Final   MCV 05/31/2024 88.4  80.0 - 100.0 fL Final   MCH 05/31/2024 28.4  26.0 - 34.0 pg Final    MCHC 05/31/2024 32.1  30.0 - 36.0 g/dL Final   RDW 90/85/7974 15.2  11.5 - 15.5 % Final   Platelets 05/31/2024 160  150 - 400 K/uL Final   nRBC 05/31/2024 0.0  0.0 - 0.2 % Final   Neutrophils Relative % 05/31/2024 64  % Final   Neutro Abs 05/31/2024 2.4  1.7 - 7.7 K/uL Final   Lymphocytes Relative 05/31/2024 18  % Final   Lymphs Abs 05/31/2024 0.7  0.7 - 4.0 K/uL Final   Monocytes Relative 05/31/2024 12  % Final   Monocytes Absolute 05/31/2024 0.5  0.1 - 1.0 K/uL Final   Eosinophils Relative 05/31/2024 4  % Final   Eosinophils Absolute 05/31/2024 0.1  0.0 - 0.5 K/uL Final   Basophils Relative 05/31/2024 1  % Final   Basophils Absolute 05/31/2024 0.0  0.0 - 0.1 K/uL Final   Immature Granulocytes 05/31/2024 1  % Final   Abs Immature Granulocytes 05/31/2024 0.03  0.00 - 0.07 K/uL Final   Sodium 05/31/2024 135  135 - 145 mmol/L Final   Potassium 05/31/2024 4.6  3.5 - 5.1 mmol/L Final   Chloride 05/31/2024 95 (L)  98 - 111 mmol/L Final   CO2 05/31/2024 26  22 - 32 mmol/L Final   Glucose, Bld 05/31/2024 398 (H)  70 - 99 mg/dL Final   BUN 90/85/7974 13  6 - 20 mg/dL Final   Creatinine, Ser 05/31/2024 0.92  0.44 - 1.00 mg/dL Final   Calcium  05/31/2024 10.0  8.9 - 10.3 mg/dL Final   GFR, Estimated 05/31/2024 >60  >60 mL/min Final   Anion gap 05/31/2024 14  5 - 15 Final   Glucose-Capillary 05/31/2024 369 (H)  70 - 99 mg/dL Final   Comment 1 90/85/7974 Notify RN   Final   Glucose-Capillary 05/31/2024 164 (H)  70 - 99 mg/dL Final   Hgb J8r MFr Bld 06/01/2024 7.7 (H)  4.8 - 5.6 % Final   Mean Plasma Glucose 06/01/2024 174.29  mg/dL Final   Cholesterol 90/84/7974 225 (H)  0 - 200 mg/dL Final   Triglycerides 90/84/7974 130  <150 mg/dL Final   HDL 90/84/7974 79  >40 mg/dL Final   Total CHOL/HDL Ratio 06/01/2024 2.8  RATIO Final   VLDL 06/01/2024 26  0 - 40 mg/dL Final   LDL Cholesterol 06/01/2024 120 (H)  0 - 99 mg/dL Final   Iron 90/84/7974 66  28 - 170 ug/dL Final   TIBC 90/84/7974 406  250 -  450 ug/dL Final   Saturation Ratios 06/01/2024 16  10.4 - 31.8 % Final   UIBC 06/01/2024 341  ug/dL Final   Ferritin 90/84/7974 91  11 - 307 ng/mL Final   Retic Ct Pct 06/01/2024 2.9  0.4 - 3.1 % Final   RBC. 06/01/2024 3.91  3.87 -  5.11 MIL/uL Final   Retic Count, Absolute 06/01/2024 111.8  19.0 - 186.0 K/uL Final   Immature Retic Fract 06/01/2024 16.7 (H)  2.3 - 15.9 % Final   Glucose-Capillary 05/31/2024 211 (H)  70 - 99 mg/dL Final   Glucose-Capillary 06/01/2024 266 (H)  70 - 99 mg/dL Final   Glucose-Capillary 06/01/2024 323 (H)  70 - 99 mg/dL Final   Glucose-Capillary 06/01/2024 317 (H)  70 - 99 mg/dL Final   Glucose-Capillary 06/01/2024 297 (H)  70 - 99 mg/dL Final   Glucose-Capillary 06/02/2024 188 (H)  70 - 99 mg/dL Final   Glucose-Capillary 06/02/2024 379 (H)  70 - 99 mg/dL Final   Glucose-Capillary 06/02/2024 300 (H)  70 - 99 mg/dL Final   Glucose-Capillary 06/02/2024 234 (H)  70 - 99 mg/dL Final   Glucose-Capillary 06/03/2024 165 (H)  70 - 99 mg/dL Final   Comment 1 90/82/7974 Notify RN   Final   Glucose-Capillary 06/03/2024 328 (H)  70 - 99 mg/dL Final   Glucose-Capillary 06/03/2024 293 (H)  70 - 99 mg/dL Final   Glucose-Capillary 06/03/2024 304 (H)  70 - 99 mg/dL Final   Glucose-Capillary 06/04/2024 208 (H)  70 - 99 mg/dL Final   Glucose-Capillary 06/04/2024 295 (H)  70 - 99 mg/dL Final   Glucose-Capillary 06/04/2024 302 (H)  70 - 99 mg/dL Final   Glucose-Capillary 06/04/2024 339 (H)  70 - 99 mg/dL Final   Comment 1 90/81/7974 Notify RN   Final   Glucose-Capillary 06/05/2024 181 (H)  70 - 99 mg/dL Final   Comment 1 90/80/7974 Notify RN   Final   Glucose-Capillary 06/05/2024 324 (H)  70 - 99 mg/dL Final   Glucose-Capillary 06/05/2024 281 (H)  70 - 99 mg/dL Final   Glucose-Capillary 06/05/2024 244 (H)  70 - 99 mg/dL Final   Glucose-Capillary 06/06/2024 160 (H)  70 - 99 mg/dL Final     Vitals: Blood pressure 139/83, pulse 63, temperature 98 F (36.7 C), temperature  source Oral, resp. rate 16, height 5' 3 (1.6 m), weight 80 kg, SpO2 97%.    Oliva DELENA Salmon, DO

## 2024-06-06 NOTE — Plan of Care (Signed)
  Problem: Education: Goal: Emotional status will improve 06/06/2024 1938 by Inda Berwyn RAMAN, RN Outcome: Progressing 06/06/2024 1935 by Inda Berwyn RAMAN, RN Outcome: Progressing Goal: Mental status will improve 06/06/2024 1938 by Inda Berwyn RAMAN, RN Outcome: Progressing 06/06/2024 1935 by Inda Berwyn RAMAN, RN Outcome: Progressing   Problem: Activity: Goal: Interest or engagement in activities will improve Outcome: Progressing   Problem: Education: Goal: Mental status will improve 06/06/2024 1938 by Inda Berwyn RAMAN, RN Outcome: Progressing 06/06/2024 1935 by Inda Berwyn RAMAN, RN Outcome: Progressing

## 2024-06-06 NOTE — Progress Notes (Signed)
 Patient continues to be hyper verbal, but has been appropriate, friendly during interactions. Pt has been visible in the milieu, observed interacting well with peers and staff- no behavioral issues noted. Pt reported sleeping well last night, described her appetite and concentration as 'good'. Per pt's self inventory, pt rated her depression,hopelessness and anxiety a 0/0/1, respectively. Pt's stated goal today is to work on discharge. Pt remarked that her father needs care and is in [place], and there were no other siblings that can help.  Pt currently denies SI/HI and A/VH and doesn't appear to be responding to internal stimuli. A. Labs and vitals monitored. Pt given and educated on medications. Pt supported emotionally and encouraged to express concerns and ask questions.   R. Pt remains safe with 15 minute checks. Will continue POC.

## 2024-06-07 DIAGNOSIS — E119 Type 2 diabetes mellitus without complications: Secondary | ICD-10-CM

## 2024-06-07 DIAGNOSIS — I10 Essential (primary) hypertension: Secondary | ICD-10-CM

## 2024-06-07 LAB — GLUCOSE, CAPILLARY
Glucose-Capillary: 160 mg/dL — ABNORMAL HIGH (ref 70–99)
Glucose-Capillary: 219 mg/dL — ABNORMAL HIGH (ref 70–99)
Glucose-Capillary: 155 mg/dL — ABNORMAL HIGH (ref 70–99)
Glucose-Capillary: 134 mg/dL — ABNORMAL HIGH (ref 70–99)

## 2024-06-07 MED ORDER — OLANZAPINE 10 MG PO TABS: 30.0000 mg | ORAL_TABLET | Freq: Every day | ORAL | Status: DC

## 2024-06-07 MED ORDER — INSULIN GLARGINE 100 UNIT/ML ~~LOC~~ SOLN
26.0000 [IU] | Freq: Every day | SUBCUTANEOUS | Status: DC
Start: 2024-06-07 — End: 2024-06-08
  Administered 2024-06-07: 26 [IU] via SUBCUTANEOUS

## 2024-06-07 MED ORDER — IRBESARTAN 150 MG PO TABS
150.0000 mg | ORAL_TABLET | Freq: Every day | ORAL | Status: DC
Start: 2024-06-08 — End: 2024-06-08
  Administered 2024-06-08: 150 mg via ORAL
  Filled 2024-06-07: qty 1

## 2024-06-07 MED ORDER — LORAZEPAM 0.5 MG PO TABS: 0.5000 mg | ORAL_TABLET | Freq: Three times a day (TID) | ORAL | Status: DC | PRN

## 2024-06-07 NOTE — Group Note (Unsigned)
 Date:  06/07/2024 Time:  8:30 PM  Group Topic/Focus:  Wrap-Up Group:   The focus of this group is to help patients review their daily goal of treatment and discuss progress on daily workbooks.     Participation Level:  {B[facility] PARTICIPATION OZCZO:77735}  Participation Quality:  {B[facility] PARTICIPATION QUALITY:22265}  Affect:  {BHH AFFECT:22266}  Cognitive:  {B[facility] COGNITIVE:22267}  Insight: {BHH Insight2:20797}  Engagement in Group:  {B[facility] ENGAGEMENT IN HMNLE:77731}  Modes of Intervention:  {B[facility] MODES OF INTERVENTION:22269}  Additional Comments:  ***  Gwenn Nobie [place] 06/07/2024, 8:30 PM

## 2024-06-07 NOTE — Group Note (Signed)
 Date:  06/07/2024 Time:  8:45 PM  Group Topic/Focus:  Wrap-Up Group:   The focus of this group is to help patients review their daily goal of treatment and discuss progress on daily workbooks.    Participation Level:  Active  Participation Quality:  Appropriate  Affect:  Appropriate  Cognitive:  Appropriate  Insight: Appropriate  Engagement in Group:  Engaged  Modes of Intervention:  Education and Exploration  Additional Comments:  Patient attended and participated in group tonight.  She reports that she likes that she is compassionate towards others.   She would like to learn how to improve on delaying her action. To think about things before taking actions.  [name] 06/07/2024, 8:45 PM

## 2024-06-07 NOTE — Progress Notes (Signed)
(  Sleep Hours) -4.25 (Any PRNs that were needed, meds refused, or side effects to meds)-  (Any disturbances and when (visitation, over night)-Pt had a tearful episode. Pt refused to let female take her BGC and stated she had PTSD and was triggered.  (Concerns raised by the patient)- Pt stated she would like to have her lasix  increased. Pt stated she is concerned about edema in both LLE and RLE. (SI/HI/AVH)-denied

## 2024-06-07 NOTE — Group Note (Unsigned)
 Date:  06/07/2024 Time:  8:51 PM  Group Topic/Focus:  Wrap-Up Group:   The focus of this group is to help patients review their daily goal of treatment and discuss progress on daily workbooks.     Participation Level:  {B[facility] PARTICIPATION OZCZO:77735}  Participation Quality:  {B[facility] PARTICIPATION QUALITY:22265}  Affect:  {BHH AFFECT:22266}  Cognitive:  {B[facility] COGNITIVE:22267}  Insight: {BHH Insight2:20797}  Engagement in Group:  {B[facility] ENGAGEMENT IN HMNLE:77731}  Modes of Intervention:  {B[facility] MODES OF INTERVENTION:22269}  Additional Comments:  ***  Stephane Niemann A Prisha Hiley 06/07/2024, 8:51 PM

## 2024-06-07 NOTE — Progress Notes (Signed)
 D. Pt presents bright, friendly, reported that she was excited to be discharged tomorrow. Pt has been visible in the milieu, observed interacting well with peers and staff. Pt currently denies SI/HI and AVH and doesn't appear to be responding to internal stimuli A. Labs and vitals monitored. Pt given and educated on medications. Pt supported emotionally and encouraged to express concerns and ask questions.   R. Pt remains safe with 15 minute checks. Will continue POC.

## 2024-06-07 NOTE — Plan of Care (Signed)
   Problem: Education: Goal: Emotional status will improve Outcome: Progressing Goal: Mental status will improve Outcome: Progressing   Problem: Activity: Goal: Interest or engagement in activities will improve Outcome: Progressing

## 2024-06-07 NOTE — Progress Notes (Signed)
 [facility] Inpatient Psychiatry Progress Note  Date: 06/07/24 Patient: [name] MRN: [id number]  Assessment and Plan: [name] is a 58 y.o. female admitted involuntarily for acute psychosis, agitation, and mania. Her UDS was negative for tested substances.   9/21 - Patient continues to improve. Will change olanzapine  now to 30 mg at bedtime in anticipation of discharge and increase insulin  glargine to 26 units at bedtime.   # Bipolar I disorder, most recent episode (or current) manic (HCC) - Olanzapine  30 mg qhs - Depakote  750 mg q12h. Level 73 on 9/13 - lorazepam  0.5 mg q8h prn   # Hypothyroidism - Synthroid  100 mcg daily - TSH elevated at admission, suspect poor compliance  # DM2 - Sliding scale insulin  - Insulin  glargine 26 units nightly  # HTN - Atenolol  100 mg BID - Laxis 20 mg daily    Risk Assessment - Moderate  Discharge Planning Estimated length of stay: 1 day Predicted Discharge location: Home     Interval History and update: No significant events overnight. Patient slept well. She has been med compliant. She received 21 units of corrective insulin  yesterday. She reports further slight decrease in LE swelling. She expressed gratitude over the treatment she has received this hospitalization, stating that the staff have been wonderful. She reports that she feels stable and is ready to discharge tomorrow. She reports that she needs access to her phone to arrange for care for her father and to either see him or have him travel to stay with her. Staff report that she generally continues to be bright and is somewhat intrusive, but far less manic. She denies depressed mood or SI. Staff report that she has been doing well.     Physical Exam MSK/Neuro - Normal gait and station Mental Status Exam Appearance - casually dressed, adequate grooming and hygiene. Legs are edematous Attitude - overly familiar, but guarded Speech -  hyperverbal, otherwise normal Mood - Wonderful Affect - Bright to euphoric Thought Process - tangential, mildly disorganized Thought Content - perseverates on needing to leave today to take care of her father in [place]. [place] SI/HI - Denies Perceptions - Denies Judgement/Insight - Grossly impaired Fund of knowledge - WNL Language - No impairments      Lab Results:  Admission on 05/21/2024  Component Date Value Ref Range Status   TSH 05/23/2024 5.380 (H)  0.350 - 4.500 uIU/mL Final   Vitamin B-12 05/23/2024 1,669 (H)  180 - 914 pg/mL Final   Folate 05/23/2024 9.2  >5.9 ng/mL Final   Vit D, 25-Hydroxy 05/23/2024 71.02  30 - 100 ng/mL Final   Valproic Acid  Lvl 05/30/2024 73  50 - 100 ug/mL Final   Color, Urine 05/30/2024 YELLOW  YELLOW Final   APPearance 05/30/2024 HAZY (A)  CLEAR Final   Specific Gravity, Urine 05/30/2024 1.010  1.005 - 1.030 Final   pH 05/30/2024 7.0  5.0 - 8.0 Final   Glucose, UA 05/30/2024 >=500 (A)  NEGATIVE mg/dL Final   Hgb urine dipstick 05/30/2024 NEGATIVE  NEGATIVE Final   Bilirubin Urine 05/30/2024 NEGATIVE  NEGATIVE Final   Ketones, ur 05/30/2024 5 (A)  NEGATIVE mg/dL Final   Protein, ur 90/86/7974 NEGATIVE  NEGATIVE mg/dL Final   Nitrite 90/86/7974 NEGATIVE  NEGATIVE Final   Leukocytes,Ua 05/30/2024 LARGE (A)  NEGATIVE Final   RBC / HPF 05/30/2024 6-10  0 - 5 RBC/hpf Final   WBC, UA 05/30/2024 >50  0 - 5 WBC/hpf Final   Bacteria, UA  05/30/2024 RARE (A)  NONE SEEN Final   Squamous Epithelial / HPF 05/30/2024 6-10  0 - 5 /HPF Final   Non Squamous Epithelial 05/30/2024 0-5 (A)  NONE SEEN Final   Free T4 05/31/2024 0.96  0.61 - 1.12 ng/dL Final   WBC 90/85/7974 3.8 (L)  4.0 - 10.5 K/uL Final   RBC 05/31/2024 3.80 (L)  3.87 - 5.11 MIL/uL Final   Hemoglobin 05/31/2024 10.8 (L)  12.0 - 15.0 g/dL Final   HCT 90/85/7974 33.6 (L)  36.0 - 46.0 % Final   MCV 05/31/2024 88.4  80.0 - 100.0 fL Final   MCH 05/31/2024 28.4  26.0 - 34.0 pg Final   MCHC  05/31/2024 32.1  30.0 - 36.0 g/dL Final   RDW 90/85/7974 15.2  11.5 - 15.5 % Final   Platelets 05/31/2024 160  150 - 400 K/uL Final   nRBC 05/31/2024 0.0  0.0 - 0.2 % Final   Neutrophils Relative % 05/31/2024 64  % Final   Neutro Abs 05/31/2024 2.4  1.7 - 7.7 K/uL Final   Lymphocytes Relative 05/31/2024 18  % Final   Lymphs Abs 05/31/2024 0.7  0.7 - 4.0 K/uL Final   Monocytes Relative 05/31/2024 12  % Final   Monocytes Absolute 05/31/2024 0.5  0.1 - 1.0 K/uL Final   Eosinophils Relative 05/31/2024 4  % Final   Eosinophils Absolute 05/31/2024 0.1  0.0 - 0.5 K/uL Final   Basophils Relative 05/31/2024 1  % Final   Basophils Absolute 05/31/2024 0.0  0.0 - 0.1 K/uL Final   Immature Granulocytes 05/31/2024 1  % Final   Abs Immature Granulocytes 05/31/2024 0.03  0.00 - 0.07 K/uL Final   Sodium 05/31/2024 135  135 - 145 mmol/L Final   Potassium 05/31/2024 4.6  3.5 - 5.1 mmol/L Final   Chloride 05/31/2024 95 (L)  98 - 111 mmol/L Final   CO2 05/31/2024 26  22 - 32 mmol/L Final   Glucose, Bld 05/31/2024 398 (H)  70 - 99 mg/dL Final   BUN 90/85/7974 13  6 - 20 mg/dL Final   Creatinine, Ser 05/31/2024 0.92  0.44 - 1.00 mg/dL Final   Calcium  05/31/2024 10.0  8.9 - 10.3 mg/dL Final   GFR, Estimated 05/31/2024 >60  >60 mL/min Final   Anion gap 05/31/2024 14  5 - 15 Final   Glucose-Capillary 05/31/2024 369 (H)  70 - 99 mg/dL Final   Comment 1 90/85/7974 Notify RN   Final   Glucose-Capillary 05/31/2024 164 (H)  70 - 99 mg/dL Final   Hgb J8r MFr Bld 06/01/2024 7.7 (H)  4.8 - 5.6 % Final   Mean Plasma Glucose 06/01/2024 174.29  mg/dL Final   Cholesterol 90/84/7974 225 (H)  0 - 200 mg/dL Final   Triglycerides 90/84/7974 130  <150 mg/dL Final   HDL 90/84/7974 79  >40 mg/dL Final   Total CHOL/HDL Ratio 06/01/2024 2.8  RATIO Final   VLDL 06/01/2024 26  0 - 40 mg/dL Final   LDL Cholesterol 06/01/2024 120 (H)  0 - 99 mg/dL Final   Iron 90/84/7974 66  28 - 170 ug/dL Final   TIBC 90/84/7974 406  250 - 450  ug/dL Final   Saturation Ratios 06/01/2024 16  10.4 - 31.8 % Final   UIBC 06/01/2024 341  ug/dL Final   Ferritin 90/84/7974 91  11 - 307 ng/mL Final   Retic Ct Pct 06/01/2024 2.9  0.4 - 3.1 % Final   RBC. 06/01/2024 3.91  3.87 - 5.11  MIL/uL Final   Retic Count, Absolute 06/01/2024 111.8  19.0 - 186.0 K/uL Final   Immature Retic Fract 06/01/2024 16.7 (H)  2.3 - 15.9 % Final   Glucose-Capillary 05/31/2024 211 (H)  70 - 99 mg/dL Final   Glucose-Capillary 06/01/2024 266 (H)  70 - 99 mg/dL Final   Glucose-Capillary 06/01/2024 323 (H)  70 - 99 mg/dL Final   Glucose-Capillary 06/01/2024 317 (H)  70 - 99 mg/dL Final   Glucose-Capillary 06/01/2024 297 (H)  70 - 99 mg/dL Final   Glucose-Capillary 06/02/2024 188 (H)  70 - 99 mg/dL Final   Glucose-Capillary 06/02/2024 379 (H)  70 - 99 mg/dL Final   Glucose-Capillary 06/02/2024 300 (H)  70 - 99 mg/dL Final   Glucose-Capillary 06/02/2024 234 (H)  70 - 99 mg/dL Final   Glucose-Capillary 06/03/2024 165 (H)  70 - 99 mg/dL Final   Comment 1 90/82/7974 Notify RN   Final   Glucose-Capillary 06/03/2024 328 (H)  70 - 99 mg/dL Final   Glucose-Capillary 06/03/2024 293 (H)  70 - 99 mg/dL Final   Glucose-Capillary 06/03/2024 304 (H)  70 - 99 mg/dL Final   Glucose-Capillary 06/04/2024 208 (H)  70 - 99 mg/dL Final   Glucose-Capillary 06/04/2024 295 (H)  70 - 99 mg/dL Final   Glucose-Capillary 06/04/2024 302 (H)  70 - 99 mg/dL Final   Glucose-Capillary 06/04/2024 339 (H)  70 - 99 mg/dL Final   Comment 1 90/81/7974 Notify RN   Final   Glucose-Capillary 06/05/2024 181 (H)  70 - 99 mg/dL Final   Comment 1 90/80/7974 Notify RN   Final   Glucose-Capillary 06/05/2024 324 (H)  70 - 99 mg/dL Final   Glucose-Capillary 06/05/2024 281 (H)  70 - 99 mg/dL Final   Glucose-Capillary 06/05/2024 244 (H)  70 - 99 mg/dL Final   Glucose-Capillary 06/06/2024 160 (H)  70 - 99 mg/dL Final   Glucose-Capillary 06/06/2024 254 (H)  70 - 99 mg/dL Final   Glucose-Capillary 06/06/2024 181  (H)  70 - 99 mg/dL Final   Glucose-Capillary 06/06/2024 212 (H)  70 - 99 mg/dL Final   Glucose-Capillary 06/07/2024 160 (H)  70 - 99 mg/dL Final     Vitals: Blood pressure (!) 153/86, pulse 62, temperature 97.8 F (36.6 C), temperature source Oral, resp. rate 16, height 5' 3 (1.6 m), weight 80 kg, SpO2 99%.    Oliva DELENA Salmon, DO

## 2024-06-08 LAB — GLUCOSE, CAPILLARY: Glucose-Capillary: 110 mg/dL — ABNORMAL HIGH (ref 70–99)

## 2024-06-08 MED ORDER — IRBESARTAN 150 MG PO TABS: 150.0000 mg | ORAL_TABLET | Freq: Every day | ORAL | 0 refills | Status: DC

## 2024-06-08 MED ORDER — FUROSEMIDE 20 MG PO TABS: 20.0000 mg | ORAL_TABLET | Freq: Every day | ORAL | 0 refills | Status: DC

## 2024-06-08 MED ORDER — ATENOLOL 100 MG PO TABS: 100.0000 mg | ORAL_TABLET | Freq: Two times a day (BID) | ORAL | 0 refills | Status: DC

## 2024-06-08 MED ORDER — DIVALPROEX SODIUM 250 MG PO DR TAB: 750.0000 mg | DELAYED_RELEASE_TABLET | Freq: Two times a day (BID) | ORAL | 0 refills | Status: DC

## 2024-06-08 MED ORDER — TRAZODONE HCL 50 MG PO TABS: 50.0000 mg | ORAL_TABLET | Freq: Every evening | ORAL | 0 refills | Status: DC | PRN

## 2024-06-08 MED ORDER — OLANZAPINE 15 MG PO TABS: 30.0000 mg | ORAL_TABLET | Freq: Every day | ORAL | 0 refills | Status: DC

## 2024-06-08 NOTE — [facility] Suicide Risk Assessment (Signed)
 [facility] INPATIENT:  Family/Significant Other Suicide Prevention Education  Suicide Prevention Education:  Education Completed; with patient,  (name of family member/significant other) has been identified by the patient as the family member/significant other with whom the patient will be residing, and identified as the person(s) who will aid the patient in the event of a mental health crisis (suicidal ideations/suicide attempt).  With written consent from the patient, the family member/significant other has been provided the following suicide prevention education, prior to the and/or following the discharge of the patient.  Patient was given Suicide Prevention Information brochure.  Patient said she doesn't have any guns or weapons.  The suicide prevention education provided includes the following: Suicide risk factors Suicide prevention and interventions National Suicide Hotline telephone number [facility] assessment telephone number [facility] Emergency Assistance 911 [facility] and/or Residential Mobile Crisis Unit telephone number  Request made of family/significant other to: Remove weapons (e.g., guns, rifles, knives), all items previously/currently identified as safety concern.   Remove drugs/medications (over-the-counter, prescriptions, illicit drugs), all items previously/currently identified as a safety concern.  The family member/significant other verbalizes understanding of the suicide prevention education information provided.  The family member/significant other agrees to remove the items of safety concern listed above.  Patrick Sohm O Haya Hemler, LCSWA 06/08/2024, 9:46 AM

## 2024-06-08 NOTE — Progress Notes (Signed)
 Patient was given return to work letter.   [name], LCSWA 06/08/2024

## 2024-06-08 NOTE — Plan of Care (Signed)
  Problem: Education: Goal: Knowledge of [place] [facility] Education information/materials will improve Outcome: Completed/Met Goal: Emotional status will improve Outcome: Completed/Met Goal: Mental status will improve Outcome: Completed/Met Goal: Verbalization of understanding the information provided will improve Outcome: Completed/Met   Problem: Activity: Goal: Interest or engagement in activities will improve Outcome: Completed/Met Goal: Sleeping patterns will improve Outcome: Completed/Met   Problem: Coping: Goal: Ability to verbalize frustrations and anger appropriately will improve Outcome: Completed/Met Goal: Ability to demonstrate self-control will improve Outcome: Completed/Met   Problem: Health Behavior/Discharge Planning: Goal: Identification of resources available to assist in meeting health care needs will improve Outcome: Completed/Met Goal: Compliance with treatment plan for underlying cause of condition will improve Outcome: Completed/Met   Problem: Physical Regulation: Goal: Ability to maintain clinical measurements within normal limits will improve Outcome: Completed/Met   Problem: Safety: Goal: Periods of time without injury will increase Outcome: Completed/Met   Problem: Education: Goal: Ability to describe self-care measures that may prevent or decrease complications (Diabetes Survival Skills Education) will improve Outcome: Completed/Met Goal: Individualized Educational Video(s) Outcome: Completed/Met   Problem: Coping: Goal: Ability to adjust to condition or change in health will improve Outcome: Completed/Met   Problem: Fluid Volume: Goal: Ability to maintain a balanced intake and output will improve Outcome: Completed/Met   Problem: Health Behavior/Discharge Planning: Goal: Ability to identify and utilize available resources and services will improve Outcome: Completed/Met Goal: Ability to manage health-related needs will  improve Outcome: Completed/Met   Problem: Metabolic: Goal: Ability to maintain appropriate glucose levels will improve Outcome: Completed/Met   Problem: Nutritional: Goal: Maintenance of adequate nutrition will improve Outcome: Completed/Met Goal: Progress toward achieving an optimal weight will improve Outcome: Completed/Met   Problem: Skin Integrity: Goal: Risk for impaired skin integrity will decrease Outcome: Completed/Met   Problem: Tissue Perfusion: Goal: Adequacy of tissue perfusion will improve Outcome: Completed/Met

## 2024-06-08 NOTE — Progress Notes (Signed)
(  Sleep Hours) - 6 (Any PRNs that were needed, meds refused, or side effects to meds)- No PRN meds given, no meds refused.  (Any disturbances and when (visitation, over night)- None  (Concerns raised by the patient)- None  (SI/HI/AVH)- Denies SI/HI/AVH
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
## 2024-06-08 NOTE — Transportation (Signed)
 06/08/2024   Camie FORBES Platts DOB: Dec 18, 1965 MRN: [id number]   RIDER WAIVER AND RELEASE OF LIABILITY  For the purposes of helping with transportation needs, [facility] partners with outside transportation providers (taxi companies, [place], Catering manager.) to give [place] patients or other approved people the choice of on-demand rides Public librarian) to our buildings for non-emergency visits.  By using [employer], I, the person signing this document, on behalf of myself and/or any legal minors (in my care using the [employer]), agree:  Science writer given to me are supplied by independent, outside transportation providers who do not work for, or have any affiliation with, [employer]. [facility] is not a transportation company. [facility] has no control over the quality or safety of the rides I get using [employer]. [facility] has no control over whether any outside ride will happen on time or not. [facility] gives no guarantee on the reliability, quality, safety, or availability on any rides, or that no mistakes will happen. I know and accept that traveling by vehicle (car, truck, SVU, fleeta, bus, taxi, etc.) has risks of serious injuries such as disability, being paralyzed, and death. I know and agree the risk of using [employer] is mine alone, and not [employer]. [employer] are provided as is and as are available. The transportation providers are in charge for all inspections and care of the vehicles used to provide these rides. I agree not to take legal action against [facility], its agents, employees, officers, directors, representatives, insurers, attorneys, assigns, successors, subsidiaries, and affiliates at any time for any reasons related directly or indirectly to using [employer]. I also agree not to take legal action against [facility] or its affiliates for any injury, death, or damage to property caused by or related to using  [employer]. I have read this Waiver and Release of Liability, and I understand the terms used in it and their legal meaning. This Waiver is freely and voluntarily given with the understanding that my right (or any legal minors) to legal action against [place] relating to [employer] is knowingly given up to use these services.   I attest that I read the Ride Waiver and Release of Liability to Camie FORBES Platts, gave Ms. [place]oo the opportunity to ask questions and answered the questions asked (if any). I affirm that DAVY FAUGHT then provided consent for assistance with transportation.       Kayona Foor, LCSWA 06/08/2024

## 2024-06-08 NOTE — BHH Suicide Risk Assessment (Signed)
 [facility] Discharge Suicide Risk Assessment   Principal Problem: Bipolar I disorder, most recent episode (or current) manic (HCC) Discharge Diagnoses: Principal Problem:   Bipolar I disorder, most recent episode (or current) manic (HCC) Active Problems:   Psychosis (HCC)    Demographic Factors:  Caucasian, Living alone, and Unemployed  Loss Factors: NA  Historical Factors: Impulsivity  Risk Reduction Factors:   Sense of responsibility to family, Religious beliefs about death, and Positive social support  Continued Clinical Symptoms:  Bipolar disorder  Cognitive Features That Contribute To Risk:  None    Suicide Risk:  Mild:  Suicidal ideation of limited frequency, intensity, duration, and specificity.  There are no identifiable plans, no associated intent, mild dysphoria and related symptoms, good self-control (both objective and subjective assessment), few other risk factors, and identifiable protective factors, including available and accessible social support.   Follow-up Information     [facility] Behavioral Medicine at [facility] Follow up on 0/83/7974.   Specialty: Behavioral Health Why: You have upcoming appointments for therapy services on 06/02/24 at 1:00 pm, and on 06/16/24 at 1:00 pm. Contact information: [address] [place]  [address] [phone]        [facility] Health Outpatient Behavioral Health at [facility]. Go on 06/23/2024.   Specialty: Behavioral Health Why: You  have an appointment for medication management services on 06/23/24 at 2:00 pm, in person. Contact information: [address] [place]   [address] [phone]                 Oliva DELENA Salmon, DO 06/08/2024, 8:35 AM

## 2024-06-08 NOTE — Progress Notes (Signed)
  [facility] Adult Case Management Discharge Plan :  Will you be returning to the same living situation after discharge:  Yes,  patient will return home At discharge, do you have transportation home?: Yes,  CSW arranged transportation through [facility] Do you have the ability to pay for your medications: No.  Release of information consent forms completed and in the chart;  Patient's signature needed at discharge.  Patient to Follow up at:  Follow-up Information     [facility] Behavioral Medicine at [facility] Follow up on 0/83/7974.   Specialty: Behavioral Health Why: You have upcoming appointments for therapy services on 06/02/24 at 1:00 pm, and on 06/16/24 at 1:00 pm. Contact information: [address] [place]  [address] [phone]        [facility] Health Outpatient Behavioral Health at [facility]. Go on 06/23/2024.   Specialty: Behavioral Health Why: You  have an appointment for medication management services on 06/23/24 at 2:00 pm, in person. Contact information: [address] [place]  [address] [phone]                Next level of care provider has access to [facility] Link:no  Safety Planning and Suicide Prevention discussed: Yes,  Elna (dad) [phone] and with patient   Has patient been referred to the Quitline?: Patient does not use tobacco/nicotine products  Patient has been referred for addiction treatment: No known substance use disorder.   Efton Thomley O Pawel Soules, LCSWA 06/08/2024, 9:44 AM

## 2024-06-08 NOTE — Group Note (Signed)
 Date:  06/08/2024 Time:  10:09 AM  Group Topic/Focus:  Goals Group:   The focus of this group is to help patients establish daily goals to achieve during treatment and discuss how the patient can incorporate goal setting into their daily lives to aide in recovery.    Participation Level:  Active  Participation Quality:  Appropriate  Affect:  Anxious  Cognitive:  Alert  Insight: Appropriate  Engagement in Group:  Engaged  Modes of Intervention:  Discussion  Additional Comments:  States that she's grateful for everyone helping her and is really to go home.  Osbaldo Mark M Dannya Pitkin 06/08/2024, 10:09 AM

## 2024-06-08 NOTE — Plan of Care (Signed)
   Problem: Education: Goal: Emotional status will improve Outcome: Progressing Goal: Mental status will improve Outcome: Progressing Goal: Verbalization of understanding the information provided will improve Outcome: Progressing   Problem: Activity: Goal: Interest or engagement in activities will improve Outcome: Progressing

## 2024-06-08 NOTE — Progress Notes (Signed)
 [name]  D/C'd Home per MD order.  Discussed with the patient and all questions fully answered.   An After Visit Summary was printed and given to the patient. Patient received prescription.  D/c education completed with patient including follow up instructions, medication list, d/c activities limitations if indicated, with other d/c instructions as indicated by MD - patient able to verbalize understanding, all questions fully answered.   Patient instructed to return to ED, call 911, or call MD for any changes in condition.   Patient escorted to the main entrance, and D/C home via public transport.  Joaquin MALVA Doing 06/08/2024 10:36 AM

## 2024-06-16 ENCOUNTER — Ambulatory Visit: Admitting: Psychology

## 2024-06-23 ENCOUNTER — Ambulatory Visit (HOSPITAL_COMMUNITY): Admitting: Psychiatry

## 2024-06-26 ENCOUNTER — Telehealth: Payer: Self-pay | Admitting: Family

## 2024-06-26 NOTE — Telephone Encounter (Signed)
 Routing to you since I am unsure on next steps since Leita is no longer here.      Copied from CRM [id number]. Topic: Appointments - Scheduling Inquiry for Clinic >> Jun 26, 2024 12:43 PM Ahlexyia S wrote: Reason for CRM: Nurse case manager Therisa called to inform clinic that pt was discharged from hospital on Sept 23rd. They have tried to reach pt and after multiple attempts to get pt scheduled for hospital follow up there has been no response. Per pt chart the last contact pt had was with Feliciano from [facility] and Feliciano was concerned about pt mental health. Also seems as if there has been multiple attempts to reach pt and there hasn't been a response. Therisa is wanting someone from clinic to reach out to her to get her scheduled with a new PCP due to previous one no longer being in office. I asked Therisa if she reached out to pt father who is listed as an emergency contact and was told no, they are no allowed to reach out to unlisted family member contacts. Informed her that I will send message and Therisa agreed that it is okay to reach out to her if need be.  Therisa- Nurse Case Manager [employer] [phone]

## 2024-06-29 NOTE — Telephone Encounter (Signed)
 Tried to reach pt by phone, call went straight to VM, called pt emergency contact (father) I stated who I was and explained that we were trying to get in touch with pt due to her provider leaving and needing to get her set up with new provider. Father gave me pt new number which I have updated in pt chart. I left [name] with [facility] a message stating update. I spoke with pt and due to not having transportation to [place] she asked for the phone number to our [facility] office to schedule a NP appt since it was closer. Their number was given.

## 2024-06-30 ENCOUNTER — Ambulatory Visit: Admitting: Psychology

## 2024-07-23 ENCOUNTER — Telehealth (HOSPITAL_COMMUNITY): Admitting: Psychiatry

## 2024-07-23 ENCOUNTER — Other Ambulatory Visit: Payer: Self-pay | Admitting: Cardiology

## 2024-07-23 NOTE — Telephone Encounter (Signed)
*  STAT* If patient is at the pharmacy, call can be transferred to refill team.   1. Which medications need to be refilled? (please list name of each medication and dose if known) irbesartan  (AVAPRO ) 150 MG tablet    2. Would you like to learn more about the convenience, safety, & potential cost savings by using the [facility]?    3. Are you open to using the [facility] (Type [facility].  ).   4. Which pharmacy/location (including street and city if local pharmacy) is medication to be sent to? [id number] - [place], [place] - [address]    5. Do they need a 30 day or 90 day supply? 90 day

## 2024-07-24 MED ORDER — IRBESARTAN 150 MG PO TABS: 150.0000 mg | ORAL_TABLET | Freq: Every day | ORAL | 0 refills | Status: DC

## 2024-08-03 ENCOUNTER — Ambulatory Visit: Admitting: Psychology

## 2024-08-03 DIAGNOSIS — F319 Bipolar disorder, unspecified: Secondary | ICD-10-CM | POA: Diagnosis not present

## 2024-08-03 NOTE — Progress Notes (Signed)
 [facility] Counselor/Therapist Progress Note  Patient ID: [name], MRN: [id number]    Date: 08/03/24  Time Spent: 10:05 am - 10:59 am : 54 Minutes  Treatment Type: Individual Therapy.  Reported Symptoms: Depression, anxiety, and PTSD.   Mental Status Exam: Appearance:  Casual     Behavior: Manic  Motor: Normal  Speech/Language:  Pressured  Affect: Congruent and Tearful  Mood: depressed  Thought process: tangential  Thought content:   WNL  Sensory/Perceptual disturbances:   WNL  Orientation: oriented to person, place, time/date, and situation  Attention: Good  Concentration: Good  Memory: WNL  Fund of knowledge:  Good  Insight:   Good  Judgment:  Good  Impulse Control: Good   Risk Assessment: Danger to Self:  No Self-injurious Behavior: No Danger to Others: No Duty to Warn:no Physical Aggression / Violence:No  Access to Firearms a concern: No  Gang Involvement:No   Subjective:   [name] participated in the session, in person in the office with the therapist, and consented to treatment [name] reviewed the events of the past week. [name] noted over 25 first responders coming to her home, over the span of ~1 week, due to concerns of others regarding her behavior. She noted later recognizing this was due to a close friend who made said calls. She noted being taken by police and taken to [facility]. She noted being handcuffed and attempted to advocate for self, to no avail. She noted being forced to take medicine via a shot and noted being stressful. She noted later being in the locked ward. She noted not remembering anything from 9/3 to 9/17. She noted getting off of Zoloft , while hospitalized, which she was thankful of. She noted being prescribed various medications, including an antipsychotic, but noted that it was easy to get off of. She noted this all being initiated by her friend [name] who did this to to get access to money, documents, house, and  papers. She was tearful during the session and noted giving feedback to the hospital system about her experience. She presented as manic during the session and exhibited a southern accent, which has not been present since the onset of treatment beginning in Feb, 2024. She noted recently adopting a pit bull, hash brown ,since her release from the hospital. She noted the financial the ramifications of being hospitalized and missing various credit card payments which she stated affected her financial standing and resulted in having to get a new phone number.. She denied any safety concern for self or others. She noted receiving support during this time from her friends. She noted  firing her psychiatrist, [name], and is looking for a new provider. She noted finding a replacement medical provider, as well, but has yet to attend the initial appointment to establish care. She noted continued to take her beta blocker for her anxiety. She noted not taking any psycho-tropic medication and noted I do not intend to take any of them. She noted recently vaping indica (marijuana) and noted the benefits of this for her. She noted that she doesn't smoke pot in the traditional way due to the risk to health. And that weed is soaked in round-up. She noted I hope you're not sharing with anyone that I am not taking my meds towards the very latter end of the session. We will work on processing this during our follow-up. [name] noted her interest in attending two sessions a week. Therapist encouraged [name] to communicate with her insurance  provider regarding this request and she was scheduled for weekly follow-ups until that time.A follow-up was scheduled for continued treatment.   Interventions: CBT   Diagnosis:   Bipolar I disorder (HCC)  Psychiatric Treatment: Yes , Dr. Laurann. Please see chart for medication list. She continues to see his psychiatric provider regularly.   Treatment Plan:  Client  Abilities/Strengths [name] is intelligent, self-aware, and motivated for change.    Support System: Father in [place]. Friends in area.   Client Treatment Preferences Outpatient Therapy.   Client Statement of Needs [name] would like to increase frequency of socialization, building local support group, engaging in consistent self-care including exercise, addressing difficulty with engagement in activities including health barriers, improving her overall health, processing past events, processing losses (mother, partner), managing symptoms, balancing responsibilities of day-to-day life.     Treatment Level Weekly  Symptoms  Anxiety: Feeling anxious, difficulty managing worry, worrying about different things, trouble relaxing, restlessness, irritability, and feeling afraid something awful might happen.    (Status: maintained) Depression: Feeling down, trouble sleeping, lethargy, over eating, difficulty with concentration.    (Status: maintained)  Goals:   [name] experiences symptoms of depression, anxiety, and PTSD.   Treatment plan signed and available on s-drive:  No, pending signature.    Target Date: 12/30/24 Frequency: Weekly  Progress: 0 Modality: individual    Therapist will provide referrals for additional resources as appropriate.  Therapist will provide psycho-education regarding [name]'s diagnosis and corresponding treatment approaches and interventions. Abigail Mullet, [name] will support the patient's ability to achieve the goals identified. will employ CBT, BA, Problem-solving, Solution Focused, Mindfulness,  coping skills, & other evidenced-based practices will be used to promote progress towards healthy functioning to help manage decrease symptoms associated with her diagnosis.   Reduce overall level, frequency, and intensity of the feelings of depression and anxiety evidenced by decreased overall symptoms from 6 to 7 days/week to 0 to 1 days/week per client report for at least 3  consecutive months. Verbally express understanding of the relationship between feelings of depression, anxiety and their impact on thinking patterns and behaviors. Verbalize an understanding of the role that distorted thinking plays in creating fears, excessive worry, and ruminations.  [name] participated in the creation of the treatment plan)   Abigail Mullet, [name]

## 2024-08-05 ENCOUNTER — Other Ambulatory Visit (HOSPITAL_COMMUNITY)
Admission: RE | Admit: 2024-08-05 | Discharge: 2024-08-05 | Disposition: A | Source: Ambulatory Visit | Attending: Obstetrics and Gynecology | Admitting: Obstetrics and Gynecology

## 2024-08-05 ENCOUNTER — Ambulatory Visit (INDEPENDENT_AMBULATORY_CARE_PROVIDER_SITE_OTHER): Admitting: Obstetrics and Gynecology

## 2024-08-05 ENCOUNTER — Encounter: Payer: Self-pay | Admitting: Obstetrics and Gynecology

## 2024-08-05 ENCOUNTER — Ambulatory Visit (INDEPENDENT_AMBULATORY_CARE_PROVIDER_SITE_OTHER): Admitting: Internal Medicine

## 2024-08-05 ENCOUNTER — Encounter: Payer: Self-pay | Admitting: Internal Medicine

## 2024-08-05 VITALS — BP 132/78 | HR 53 | Ht 60.0 in | Wt 149.0 lb

## 2024-08-05 VITALS — BP 122/70 | HR 59 | Ht 60.0 in | Wt 148.8 lb

## 2024-08-05 DIAGNOSIS — E2839 Other primary ovarian failure: Secondary | ICD-10-CM

## 2024-08-05 DIAGNOSIS — E039 Hypothyroidism, unspecified: Secondary | ICD-10-CM

## 2024-08-05 DIAGNOSIS — Z01419 Encounter for gynecological examination (general) (routine) without abnormal findings: Secondary | ICD-10-CM | POA: Insufficient documentation

## 2024-08-05 DIAGNOSIS — Z1331 Encounter for screening for depression: Secondary | ICD-10-CM

## 2024-08-05 DIAGNOSIS — N942 Vaginismus: Secondary | ICD-10-CM | POA: Diagnosis not present

## 2024-08-05 DIAGNOSIS — Z113 Encounter for screening for infections with a predominantly sexual mode of transmission: Secondary | ICD-10-CM | POA: Diagnosis not present

## 2024-08-05 DIAGNOSIS — E1165 Type 2 diabetes mellitus with hyperglycemia: Secondary | ICD-10-CM | POA: Diagnosis not present

## 2024-08-05 DIAGNOSIS — Z794 Long term (current) use of insulin: Secondary | ICD-10-CM

## 2024-08-05 DIAGNOSIS — Z1231 Encounter for screening mammogram for malignant neoplasm of breast: Secondary | ICD-10-CM

## 2024-08-05 DIAGNOSIS — N898 Other specified noninflammatory disorders of vagina: Secondary | ICD-10-CM

## 2024-08-05 DIAGNOSIS — Z1211 Encounter for screening for malignant neoplasm of colon: Secondary | ICD-10-CM

## 2024-08-05 DIAGNOSIS — E782 Mixed hyperlipidemia: Secondary | ICD-10-CM | POA: Diagnosis not present

## 2024-08-05 LAB — POCT GLYCOSYLATED HEMOGLOBIN (HGB A1C): Hemoglobin A1C: 9.5 % — AB (ref 4.0–5.6)

## 2024-08-05 MED ORDER — DEXCOM G7 SENSOR MISC
3.0000 | 4 refills | Status: AC
Start: 2024-08-05 — End: ?

## 2024-08-05 MED ORDER — ESTRADIOL 0.01 % VA CREA: 1.0000 | TOPICAL_CREAM | VAGINAL | 0 refills | Status: AC

## 2024-08-05 NOTE — Progress Notes (Signed)
 Patient ID: [name], female   DOB: 1966/08/01, 58 y.o.   MRN: [id number]  HPI: [name] is a 58 y.o.-year-old female, returning for f/u for DM2, dx 2013, insulin -dependent, uncontrolled, with complications (diabetic retinopathy) and also hypothyroidism. Last visit 4 months ago.  Interim history: No increased urination, blurry vision, chest pain.   Since last visit, she had an episode of mania with delusions 05/2024.  She was involuntary committed-per review of the chart, she was not sleeping and not taking her medicines.  However, she mentions that she is not sure why she was apprehended by police.  She was taken to behavioral health and was indicated.  At today's visit she tells me that she actually woke up after 3 weeks.  During this period of time, she mentions her sugars were not checked and she was not given diabetic medications.  When sugars started to be checked eventually, they were in the 300s.  They did start to improve afterwards despite the fact that she is not taking her diabetic regimen.  She was taken off her psychotropic medications and she is actually feeling much better and she lost weight  -approximately 20 pounds.  At today's visit she is still off the insulin  and also off her glucose sensor.  She is not checking blood sugars.  DM2: Reviewed HbA1c levels: Lab Results  Component Value Date   HGBA1C 7.7 (H) 06/01/2024   HGBA1C 6.4 (A) 04/03/2024   HGBA1C 6.7 (A) 12/03/2023   Pt is on a regimen of: - Lantus  12 >> ... 20 >> 25 >> 20 >> 20 units at bedtime >> 24 >> 20 >> 26 units in am - Novolog  15 min before meals 10-12 units before a smaller meal 16-17 units before a regular meal 22-26 units before a high-carb meal - Sliding scale for NovoLog : - 150- 165: + 1 unit  - 166- 180: + 2 units  - 181- 195: + 3 units  - 196- 210: + 4 units  - 211- 225: + 5 units  - 226- 240: + 6 units  - >240 + 7 units  We stopped Cycloset  in 06/2020. She did not start Welchol  2  tabs 2x a day - added 03/2017 - too expensive On Turmeric, alpha-lipoic acid + 660 mcg biotin per day, Garlicin. We stopped Glipizide  when we started NovoLog .  She has various intolerances to different diabetic medicines: Metformin  ER >> in the past, she had gastric discomfort and severe diarrhea (loss of bowel control) Januvia >> tried for 1-2 months >> left chest pain She was wondering if she could take Invokana, however she had multiple vaginal yeast infections. She Also had nocturia 3-4 x a night.  She had reactive hypoglycemia symptoms while on Quercetin (was taking it for allergies)  She stopped Rybelsus  7 mg >> stopped 2/2 N and D   She checks her sugars more than 4 times a day with her libre 3+ CGM (now with phone):  Previously:  Previously:  Lowest: LO x2 >> .SABRA. 40 >> 50 >> 63 (at night). Highest: 455 >> .SABRASABRA  300s >> 300s.  She has many food intolerances: - Gluten >> cannot eat bread or pastry.  - raw vegetables, beans, cruciferous vegetables, fruit >> diarrhea.  - No dairy but, cereals  -at night -now off ice cream In the past, no she was taking up to 36 units of NovoLog  for ice cream.  I strongly advised her to stop this in the past. She does  not digest fat well after her cholecystectomy in 2011.   Meter: One Touch Mini  No CKD, last BUN/creatinine was:  Lab Results  Component Value Date   BUN 13 05/31/2024   CREATININE 0.92 05/31/2024   Lab Results  Component Value Date   MICRALBCREAT 43 (H) 05/05/2024  On irbesartan  75 >> 150.  + HL: Lab Results  Component Value Date   CHOL 225 (H) 06/01/2024   HDL 79 06/01/2024   LDLCALC 120 (H) 06/01/2024   LDLDIRECT 205.0 01/24/2016   TRIG 130 06/01/2024   CHOLHDL 2.8 06/01/2024  Previous cholesterol levels were: 325/197/132/154.  She could not tolerate pravastatin  or Livalo  due to muscle aches.  She refused a referral to lipid clinic in the past for PCSK9 inhibitors.  On fluvastatin  XL + CoQ10. On Fish oil now.  I  suggested Zetia but she refused due to the possibility of developing diarrhea on it. At last visit I suggested Red Yeast Rice try 600 mg twice a day >> did not try it yet. Now seen in the lipid clinic.  Planning to start Repatha.  - last dilated eye exam was on 07/08/2023: + DR. Dr. Buren.   - + Numbness and tingling in her legs.  She is on the R enantiomer of alpha-lipoic acid.  Last foot exam 04/03/2024.  She also has a history of PCOS- she has seen Dr. Lauraine Gaines at [facility] in the past - note from 08/26/2012: perimenopause, and at that time she was on Yasmin, subsequently changed to Necon. She sees Dr. Rosaline Perches with OB/GYN. She has PTSD, MDD, insomnia, anxiety, asthma, HTN, GERD, status post cholecystectomy, anemia, transaminitis. She also had increased uterine bleeding (on Necon). She sawan acupuncturist. She takes Nettle powder >> helped her with diarrhea, anemia, insomnia. 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 Also, Undecylenic acid and mastic gum, no carbs x 1-2 months.  She has IBS >> better on L- plantarum, S.bulardii - Brevibacillus Laterosporus 20 min before b'fast.   Hypothyroidism  -Diagnosed in 02/2018. We started levothyroxine  03/2018.  Pt is on levothyroxine  100 mcg daily: - in am  - fasting - at least 30 min from b'fast - + butyrate (with Ca) later in the day - no iron - no multivitamins - no PPIs - not on Biotin  Reviewed her TFTs: Lab Results  Component Value Date   TSH 5.380 (H) 05/23/2024   TSH 1.74 04/03/2024   TSH 6.85 (H) 07/26/2023   TSH 1.78 09/27/2022   TSH 6.06 (H) 06/27/2022   TSH 8.33 (H) 02/22/2022   TSH 2.02 02/06/2021   TSH 2.82 07/01/2020   TSH 6.44 (H) 03/22/2020   TSH 2.96 04/01/2019   FREET4 0.96 05/31/2024   FREET4 1.4 04/03/2024   FREET4 0.80 07/26/2023   FREET4 0.88 06/27/2022   FREET4 0.91 02/22/2022   FREET4 0.63 02/06/2021   FREET4 0.78 07/01/2020   FREET4 0.91 03/22/2020    FREET4 0.60 04/01/2019   FREET4 0.84 07/30/2018   T3FREE 2.6 04/04/2018   Her antithyroid antibodies were not elevated: Component     Latest Ref Rng & Units 04/04/2018  Thyroglobulin Ab     < or = 1 IU/mL <1  Thyroperoxidase Ab SerPl-aCnc     <9 IU/mL 2   No FH of thyroid  cancer. No h/o radiation tx to head or neck. No Biotin use. No recent steroids use.   Pt denies: - feeling nodules in neck - hoarseness -  dysphagia - choking  She takes vitamin D  5000 units.  ROS: + See HPI  I reviewed pt's medications, allergies, PMH, social hx, family hx, and changes were documented in the history of present illness. Otherwise, unchanged from my initial visit note.  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   PTSD (post-traumatic stress disorder)    Seasonal allergies    Thyroid  disease    Past Surgical History:  Procedure Laterality Date   CHOLECYSTECTOMY     GALLBLADDER SURGERY     Social History   Socioeconomic History   Marital status: Single    Spouse name: Not on file   Number of children: 0   Years of education: 38   Highest education level: Master's degree (e.g., MA, MS, MEng, MEd, MSW, MBA)  Occupational History   Occupation: Long Term Disability    Comment: PTSD  Tobacco Use   Smoking status: Never   Smokeless tobacco: Never  Vaping Use   Vaping status: Never Used  Substance and Sexual Activity   Alcohol use: Yes    Alcohol/week: 1.0 standard drink of alcohol    Types: 1 Standard drinks or equivalent per week    Comment: rare   Drug use: No   Sexual activity: Not Currently    Partners: Male    Birth control/protection: Abstinence    Comment: 1st  intercourse- 16, partners more than 5  Other Topics Concern   Not on file  Social History Narrative   Regular exercise: yes, walk   Caffeine use: occasionally tea   Fun: Social group events, walking her dogs, movies   Denies abuse and feels safe where she lives.    Social Drivers of Corporate Investment Banker Strain: Low Risk  (01/08/2022)   Overall Financial Resource Strain (CARDIA)    Difficulty of Paying Living Expenses: Not hard at all  Food Insecurity: No Food Insecurity (05/21/2024)   Hunger Vital Sign    Worried About Running Out of Food in the Last Year: Never true    Ran Out of Food in the Last Year: Never true  Transportation Needs: No Transportation Needs (05/21/2024)   PRAPARE - Administrator, Civil Service (Medical): No    Lack of Transportation (Non-Medical): No  Physical Activity: Inactive (01/08/2022)   Exercise Vital Sign    Days of Exercise per Week: 0 days    Minutes of Exercise per Session: 0 min  Stress: No Stress Concern Present (01/08/2022)   [employer] of Occupational Health - Occupational Stress Questionnaire    Feeling of Stress : Not at all  Social Connections: Socially Isolated (01/08/2022)   Social Connection and Isolation Panel    Frequency of Communication with Friends and Family: Twice a week    Frequency of Social Gatherings with Friends and Family: More than three times a week    Attends Religious Services: Never    Database Administrator or Organizations: No    Attends Banker Meetings: Never    Marital Status: Never married  Intimate Partner Violence: Not At Risk (05/21/2024)   Humiliation, Afraid,  Rape, and Kick questionnaire    Fear of Current or Ex-Partner: No    Emotionally Abused: No    Physically Abused: No    Sexually Abused: No   Current Outpatient Medications on File Prior to Visit  Medication Sig Dispense Refill   albuterol  (VENTOLIN  HFA) 108 (90 Base) MCG/ACT inhaler Inhale 2 puffs into the lungs every  4 (four) hours as needed for wheezing or shortness of breath. 18 g 5   atenolol  (TENORMIN ) 100 MG tablet Take 1 tablet (100 mg total) by mouth 2 (two) times daily. 60 tablet 0   Blood Glucose Monitoring Suppl (ONE TOUCH ULTRA MINI) w/Device KIT Use to check blood sugar 2 times a day. 1 kit 0   Blood Glucose Monitoring Suppl (ONETOUCH VERIO) w/Device KIT Use as instructed to check blood sugar 4X daily 1 kit 0   Continuous Glucose Sensor (FREESTYLE LIBRE 3 PLUS SENSOR) MISC Inject 1 Device into the skin continuous. Change every 15 days 6 each 1   Digestive Enzymes (ENZYME DIGEST PO) Take by mouth.     divalproex  (DEPAKOTE ) 250 MG DR tablet Take 3 tablets (750 mg total) by mouth every 12 (twelve) hours. (Patient not taking: Reported on 08/05/2024) 180 tablet 0   fexofenadine  (ALLEGRA ) 60 MG tablet Take 120 mg by mouth 2 (two) times daily.     furosemide  (LASIX ) 20 MG tablet Take 1 tablet (20 mg total) by mouth daily for 14 days. (Patient not taking: Reported on 08/05/2024) 14 tablet 0   glucose blood (ONETOUCH VERIO) test strip Use as instructed to check blood sugar 4X daily 300 each 2   Insulin  Pen Needle (BD PEN NEEDLE NANO 2ND GEN) 32G X 4 MM MISC USE 4 TIMES DAILY 400 each 3   irbesartan  (AVAPRO ) 150 MG tablet Take 1 tablet (150 mg total) by mouth daily. Patient must follow up with Dr. Lavona for more refills. 90 tablet 0   LANTUS  SOLOSTAR 100 UNIT/ML Solostar Pen Inject 26 Units into the skin daily. (Patient not taking: Reported on 08/05/2024) 45 mL 1   levothyroxine  (SYNTHROID ) 100 MCG tablet TAKE 1 TABLET(100 MCG) BY MOUTH DAILY 90 tablet 3   methocarbamol  (ROBAXIN ) 500 MG tablet Take 500 mg by mouth every 8 (eight) hours as needed for muscle spasms.     NOVOLOG  FLEXPEN 100 UNIT/ML FlexPen Inject 10-30 Units into the skin 3 (three) times daily with meals. (Patient not taking: Reported on 08/05/2024) 90 mL 2   OLANZapine  (ZYPREXA ) 15 MG tablet Take 2 tablets (30 mg total) by mouth at bedtime.  (Patient not taking: Reported on 08/05/2024) 60 tablet 0   Olopatadine HCl (PATADAY OP) Apply to eye.     traZODone  (DESYREL ) 50 MG tablet Take 1 tablet (50 mg total) by mouth at bedtime as needed for sleep. (Patient not taking: Reported on 08/05/2024) 30 tablet 0   [DISCONTINUED] EFFEXOR XR 150 MG 24 hr capsule 375 mg daily.  (Patient not taking: Reported on 02/20/2024)     No current facility-administered medications on file prior to visit.   Allergies  Allergen Reactions   Latex Itching and Swelling   Fluticasone      Nose bleeds   Metformin  And Related     GI side effects   Pravastatin      myalgias   Family History  Problem Relation Age of Onset   Bipolar disorder Mother    Hypertension Mother    Diabetes Mother    Heart disease Mother  CAD, pacemaker   Alzheimer's disease Father    Hypertension Father    Alzheimer's disease Maternal Grandmother    Heart disease Paternal Grandfather    PE: BP 122/70   Pulse (!) 59   Ht 5' (1.524 m)   Wt 148 lb 12.8 oz (67.5 kg)   LMP 12/22/2013   SpO2 95%   BMI 29.06 kg/m    Wt Readings from Last 3 Encounters:  08/05/24 149 lb (67.6 kg)  05/20/24 177 lb (80.3 kg)  04/03/24 177 lb 3.2 oz (80.4 kg)   Constitutional: overweight, in NAD Eyes:  EOMI, no exophthalmos ENT: no neck masses, no cervical lymphadenopathy Cardiovascular: RRR, No MRG Respiratory: CTA B Musculoskeletal: no deformities Skin: No rashes Neurological: +tremor with outstretched hands  ASSESSMENT: 1. DM2, insulin -dependent, uncontrolled, with complications - DR  2. HL - Very high LDL - high HDL - slightly high TG  3.  Hypothyroidism  PLAN:  1. DM2 - Patient with insulin -dependent type 2 diabetes, with fair control, but usually with high blood sugars after dietary indiscretions including sweets.  At last visit, sugars were fluctuating, increasing after she started eating midday, but particularly later in the day, in the afternoon and evening.  She  was taking NovoLog  many times after meals, at approximately the same doses that she would have normally taken before meals.  As a consequence, she had hyperglycemia immediately after meals, followed by hypoglycemia episodes.  We discussed about trying her best to inject NovoLog  15 minutes before meals, but if she had to take it later, do not take the full dose, only to take a sliding scale.  We did not change the regimen otherwise.  HbA1c was lower, at 6.4%, but since then, she had another HbA1c that was higher, at 7.7% 2 months ago. -At today's visit, after her admission to behavioral health, her psychotropic medications were stopped and she lost 30 pounds.  We discussed that this could have been also due to uncontrolled blood sugars.  She is still off her insulin  and not checking blood sugars.  She would be interested to restart on the CGM, but would like to get the Dexcom sensor so that she could check with her phone.  Prescription sent to the pharmacy.  Will also restart a lower dose of Lantus  and NovoLog  and advised her to titrate the doses up if needed. - I advised her to:  Patient Instructions  Please start the Dexcom sensor.  Restart: - Lantus  15 units in am (increase by 2-3 units every 2-3 days until sugars in am are <130) - Novolog  15 min before meals:  4 units before a smaller meal 5 units before a regular meal 6 units before a high-carb meal   Please continue: - Levothyroxine  100 mg daily  Take the thyroid  hormone every day, with water , at least 30 minutes before breakfast, separated by at least 4 hours from: - acid reflux medications - calcium  - iron - multivitamins   Please return in 1-1.5 months.  - we checked her HbA1c: 9.5% (much higher) - advised to check sugars at different times of the day - 4x a day, rotating check times - advised for yearly eye exams >> she is not UTD -She had an elevated ACR at last visit.  Will repeat this at next visit - return to clinic in 4  months  2. HL - Lipid panel was reviewed: She previously had very high LDL, which improved at last check: Lab Results  Component Value Date  CHOL 225 (H) 06/01/2024   HDL 79 06/01/2024   LDLCALC 120 (H) 06/01/2024   LDLDIRECT 205.0 01/24/2016   TRIG 130 06/01/2024   CHOLHDL 2.8 06/01/2024  -She previous she could not tolerate Livalo . I Advised her to try Zetia but she declined that she read that this could cause diarrhea. -She declined a referral to the lipid clinic for PCSK9 inhibitors -She continues to take fluvastatin  XL inconsistently -She used to eat ice cream and other sweets -She started fish oil 2400 mg daily -I suggested red yeast rice but she did not start this -She was then seen by Dr. Lavona and referred to [facility].D. for starting Repatha.  -The 10-year ASCVD risk score (Arnett DK, et al., 2019) is: 4.9%  - Further management per cardiology  3.   Hypothyroidism - latest thyroid  labs reviewed with pt. >> TSH was elevated at last check, previously normal at last visit: Lab Results  Component Value Date   TSH 5.380 (H) 05/23/2024  - I suspect that her TSH was elevated 2 months ago due to missing LT4 doses-at that time she mentioned that she was missing all of her medicines - she continues on LT4 100 mcg daily - pt feels good on this dose. - we discussed about taking the thyroid  hormone every day, with water , >30 minutes before breakfast, separated by >4 hours from acid reflux medications, calcium , iron, multivitamins. Pt. is taking it correctly.  She previously estimated missing 25% of the doses.  We discussed about the importance of taking it consistently even if she wakes up late. - will check thyroid  tests at next visit  Lela Fendt, MD PhD [facility] Endocrinology
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
## 2024-08-05 NOTE — Patient Instructions (Addendum)
 Please start the Dexcom sensor.  Restart: - Lantus  15 units in am (increase by 2-3 units every 2-3 days until sugars in am are <130) - Novolog  15 min before meals:  4 units before a smaller meal 5 units before a regular meal 6 units before a high-carb meal   Please continue: - Levothyroxine  100 mg daily  Take the thyroid  hormone every day, with water , at least 30 minutes before breakfast, separated by at least 4 hours from: - acid reflux medications - calcium  - iron - multivitamins   Please return in 1-1.5 months.

## 2024-08-05 NOTE — Addendum Note (Signed)
 Addended by: CLEOTILDE ROLIN RAMAN on: 08/05/2024 01:27 PM   Modules accepted: Orders

## 2024-08-06 ENCOUNTER — Ambulatory Visit: Payer: Self-pay | Admitting: Obstetrics and Gynecology

## 2024-08-06 DIAGNOSIS — E1165 Type 2 diabetes mellitus with hyperglycemia: Secondary | ICD-10-CM | POA: Diagnosis not present

## 2024-08-06 DIAGNOSIS — I739 Peripheral vascular disease, unspecified: Secondary | ICD-10-CM | POA: Diagnosis not present

## 2024-08-06 DIAGNOSIS — M2041 Other hammer toe(s) (acquired), right foot: Secondary | ICD-10-CM | POA: Diagnosis not present

## 2024-08-06 DIAGNOSIS — M7742 Metatarsalgia, left foot: Secondary | ICD-10-CM | POA: Diagnosis not present

## 2024-08-06 DIAGNOSIS — M21611 Bunion of right foot: Secondary | ICD-10-CM | POA: Diagnosis not present

## 2024-08-06 DIAGNOSIS — R200 Anesthesia of skin: Secondary | ICD-10-CM | POA: Diagnosis not present

## 2024-08-06 DIAGNOSIS — M7741 Metatarsalgia, right foot: Secondary | ICD-10-CM | POA: Diagnosis not present

## 2024-08-06 DIAGNOSIS — M792 Neuralgia and neuritis, unspecified: Secondary | ICD-10-CM | POA: Diagnosis not present

## 2024-08-06 LAB — RPR: RPR Ser Ql: NONREACTIVE

## 2024-08-06 LAB — HIV ANTIBODY (ROUTINE TESTING W REFLEX)
HIV 1&2 Ab, 4th Generation: NONREACTIVE
HIV FINAL INTERPRETATION: NEGATIVE

## 2024-08-06 LAB — HEPATITIS B SURFACE ANTIGEN: Hepatitis B Surface Ag: NONREACTIVE

## 2024-08-06 LAB — HEPATITIS C ANTIBODY: Hepatitis C Ab: NONREACTIVE

## 2024-08-07 ENCOUNTER — Other Ambulatory Visit: Payer: Self-pay

## 2024-08-07 ENCOUNTER — Telehealth: Payer: Self-pay

## 2024-08-07 LAB — SURESWAB® ADVANCED VAGINITIS PLUS,TMA
C. trachomatis RNA, TMA: NOT DETECTED
CANDIDA SPECIES: NOT DETECTED
Candida glabrata: NOT DETECTED
N. gonorrhoeae RNA, TMA: NOT DETECTED
SURESWAB(R) ADV BACTERIAL VAGINOSIS(BV),TMA: NEGATIVE
TRICHOMONAS VAGINALIS (TV),TMA: NOT DETECTED

## 2024-08-07 NOTE — Telephone Encounter (Signed)
 Pt need PA for Dexcom G7. Thanks.

## 2024-08-08 ENCOUNTER — Other Ambulatory Visit (HOSPITAL_COMMUNITY): Payer: Self-pay

## 2024-08-08 ENCOUNTER — Telehealth: Payer: Self-pay

## 2024-08-08 NOTE — Telephone Encounter (Signed)
 Pharmacy Patient Advocate Encounter   Received notification from Pt Calls Messages that prior authorization for Dexcom G7 sensor is required/requested.   Insurance verification completed.   The patient is insured through [employer].   Per test claim: PA required; PA submitted to above mentioned insurance via Latent Key/confirmation #/EOC [id number] Status is pending

## 2024-08-10 LAB — CYTOLOGY - PAP: Diagnosis: NEGATIVE

## 2024-08-10 NOTE — Telephone Encounter (Signed)
 Pharmacy Patient Advocate Encounter  Received notification from [employer] that Prior Authorization for Dexcom G7 sensor has been DENIED.  Full denial letter will be uploaded to the media tab. See denial reason below.    Medication is not eligible for pharmacy benefits and must be billed through medical insurance. As our team only handles pharmacy related prior auths, medical PA's must be submitted by the clinic. Thank you

## 2024-08-11 DIAGNOSIS — R0602 Shortness of breath: Secondary | ICD-10-CM | POA: Insufficient documentation

## 2024-08-11 DIAGNOSIS — E118 Type 2 diabetes mellitus with unspecified complications: Secondary | ICD-10-CM | POA: Insufficient documentation

## 2024-08-11 NOTE — Progress Notes (Unsigned)
 Cardiology Office Note:   Date:  08/12/2024  ID:  [name], DOB 18-Oct-1965, MRN [id number] PCP: Jason Leita Repine, FNP (Inactive)  [facility] Providers Cardiologist:  None {  History of Present Illness:   [name] is a 59 y.o. female who presents for evaluation of significant cardiovascular risk factors.  Since I last saw her she was in the hospital for management of her bipolar condition.  She had med changes and thankfully feels much better with the change in her meds.  She has much more energy.  She has actually lost about 25 pounds.  She is not having any new shortness of breath, PND or orthopnea.  She is not having any chest pressure, neck or arm discomfort.  She has had no weight gain or edema.  I do note that her A1c is 9.5 but she says she was not getting her insulin  during a 30-day hospitalization.  She does have dyslipidemia and I have previously suggested Repatha because of statin intolerance but she did not want to do this.  She comes back for routine follow-up today.  She has had no new cardiovascular complaints.  ROS: As stated in the HPI and negative for all other systems.  Studies Reviewed:    EKG:     NA  Risk Assessment/Calculations:              Physical Exam:   VS:  BP 132/60 (BP Location: Left Arm, Patient Position: Sitting, Cuff Size: Normal)   Pulse 60   Ht 5' (1.524 m)   Wt 153 lb 6.4 oz (69.6 kg)   LMP 12/22/2013   SpO2 99%   BMI 29.96 kg/m    Wt Readings from Last 3 Encounters:  08/12/24 153 lb 6.4 oz (69.6 kg)  08/05/24 148 lb 12.8 oz (67.5 kg)  08/05/24 149 lb (67.6 kg)     GEN: Well nourished, well developed in no acute distress NECK: No JVD; No carotid bruits CARDIAC: RRR, 2 out of 6 brief apical systolic murmur radiating slightly out the aortic outflow tract, no diastolic murmurs, rubs, gallops RESPIRATORY:  Clear to auscultation without rales, wheezing or rhonchi  ABDOMEN: Soft, non-tender, non-distended EXTREMITIES:   No edema; No deformity   ASSESSMENT AND PLAN:   DM: A1c is 9.5 as above.  She is working with her endocrinologist and is due to have this rechecked in December.  HTN: Her blood pressure is at target.  No change in therapy.   Dyslipidemia: Her LDL is 120.  Total was 225.  She wants to get this rechecked in December and did not want to start Repatha.  She will let me know 160.  I would agree that this is too high given her diabetes and I think she needs medical therapy.  She has been intolerant to statins.  I am going to send her to our Pharm.D. clinic to consider Repatha.   Elevated coronary calcium : She had this in June of this year.  Her score was 122 which was 95th percentile.  Given the shortness of breath I had suggested a PET scan but she did not follow through with this.  She said that her symptoms have abated.  I am trying to pursue aggressive primary risk reduction as above.  No further testing.  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  I will follow this clinically.     Follow up with me in 1 year.  Signed, Lynwood Schilling, MD

## 2024-08-12 ENCOUNTER — Ambulatory Visit: Attending: Cardiology | Admitting: Cardiology

## 2024-08-12 ENCOUNTER — Encounter: Payer: Self-pay | Admitting: Cardiology

## 2024-08-12 VITALS — BP 132/60 | HR 60 | Ht 60.0 in | Wt 153.4 lb

## 2024-08-12 DIAGNOSIS — E118 Type 2 diabetes mellitus with unspecified complications: Secondary | ICD-10-CM | POA: Diagnosis not present

## 2024-08-12 DIAGNOSIS — E785 Hyperlipidemia, unspecified: Secondary | ICD-10-CM | POA: Diagnosis not present

## 2024-08-12 DIAGNOSIS — R0602 Shortness of breath: Secondary | ICD-10-CM

## 2024-08-12 MED ORDER — IRBESARTAN 150 MG PO TABS: 150.0000 mg | ORAL_TABLET | Freq: Every day | ORAL | 3 refills | Status: AC

## 2024-08-12 MED ORDER — ATENOLOL 100 MG PO TABS
100.0000 mg | ORAL_TABLET | Freq: Two times a day (BID) | ORAL | 3 refills | Status: AC
Start: 2024-08-12 — End: ?

## 2024-08-12 MED ORDER — METHOCARBAMOL 500 MG PO TABS: 500.0000 mg | ORAL_TABLET | Freq: Three times a day (TID) | ORAL | 0 refills | Status: AC | PRN

## 2024-08-12 NOTE — Patient Instructions (Signed)
 Medication Instructions:  Your physician recommends that you continue on your current medications as directed. Please refer to the Current Medication list given to you today.  *If you need a refill on your cardiac medications before your next appointment, please call your pharmacy*  Lab Work: NONE If you have labs (blood work) drawn today and your tests are completely normal, you will receive your results only by: MyChart Message (if you have MyChart) OR A paper copy in the mail If you have any lab test that is abnormal or we need to change your treatment, we will call you to review the results.  Testing/Procedures: NONE  Follow-Up: At [facility], you and your health needs are our priority.  As part of our continuing mission to provide you with exceptional heart care, our providers are all part of one team.  This team includes your primary Cardiologist (physician) and Advanced Practice Providers or APPs (Physician Assistants and Nurse Practitioners) who all work together to provide you with the care you need, when you need it.  Your next appointment:   1 year(s)  Provider:   Lavonne Prairie, MD  We recommend signing up for the patient portal called MyChart.  Sign up information is provided on this After Visit Summary.  MyChart is used to connect with patients for Virtual Visits (Telemedicine).  Patients are able to view lab/test results, encounter notes, upcoming appointments, etc.  Non-urgent messages can be sent to your provider as well.   To learn more about what you can do with MyChart, go to [email or web address].

## 2024-08-17 ENCOUNTER — Ambulatory Visit (INDEPENDENT_AMBULATORY_CARE_PROVIDER_SITE_OTHER): Admitting: Psychology

## 2024-08-17 DIAGNOSIS — F319 Bipolar disorder, unspecified: Secondary | ICD-10-CM
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
## 2024-08-18 ENCOUNTER — Other Ambulatory Visit: Payer: Self-pay

## 2024-08-18 DIAGNOSIS — Z794 Long term (current) use of insulin: Secondary | ICD-10-CM

## 2024-08-18 MED ORDER — NOVOLOG FLEXPEN 100 UNIT/ML ~~LOC~~ SOPN: 10.0000 [IU] | PEN_INJECTOR | Freq: Three times a day (TID) | SUBCUTANEOUS | 2 refills | Status: AC

## 2024-08-18 MED ORDER — BD PEN NEEDLE NANO 2ND GEN 32G X 4 MM MISC: 3 refills | Status: AC

## 2024-08-18 MED ORDER — LANTUS SOLOSTAR 100 UNIT/ML ~~LOC~~ SOPN: 26.0000 [IU] | PEN_INJECTOR | Freq: Every day | SUBCUTANEOUS | 1 refills | Status: AC

## 2024-08-18 NOTE — Telephone Encounter (Signed)
 Patient called stating she is needing [name] wanted to see if Dexcom needs a Prior Authorization

## 2024-08-21 ENCOUNTER — Encounter: Payer: PPO | Admitting: Family

## 2024-08-25 ENCOUNTER — Ambulatory Visit: Admitting: Psychology

## 2024-08-27 ENCOUNTER — Other Ambulatory Visit: Payer: Self-pay

## 2024-08-27 MED ORDER — FREESTYLE LIBRE 3 PLUS SENSOR MISC: 1.0000 | 3 refills | Status: DC

## 2024-08-27 MED ORDER — FREESTYLE LIBRE 3 READER DEVI: 0 refills | Status: DC

## 2024-08-28 ENCOUNTER — Encounter: Payer: Self-pay | Admitting: Pharmacist

## 2024-08-28 ENCOUNTER — Encounter: Payer: Self-pay | Admitting: Internal Medicine

## 2024-08-28 NOTE — Progress Notes (Signed)
 Pharmacy Quality Measure Review  This patient is appearing on a report for being at risk of failing the adherence measure for hypertension (ACEi/ARB) medications this calendar year.   Medication: irbesartan  Last fill date: 06/26/2024 for 30 day supply per adherence report  Reviewed recent refill history in Dr Annemarie database. Actual last refill date was 07/25/2024 for 90 day supply. Patient has 3 refills remaining. Next appointment with PCP is not yet set. She is planning to transfer care back to [facility] office since Leita Elbe has left our office and Landy Stains is coser for her.     Insurance report was not up to date. No action needed at this time.   Madelin Ray, PharmD Clinical Pharmacist [facility]  Population Health [phone]

## 2024-08-31 MED ORDER — FREESTYLE LIBRE 3 PLUS SENSOR MISC: 1.0000 | 3 refills | Status: AC

## 2024-08-31 MED ORDER — FREESTYLE LIBRE 3 READER DEVI: 0 refills | Status: AC

## 2024-08-31 NOTE — Addendum Note (Signed)
 Addended by: CLEOTILDE ROLIN RAMAN on: 08/31/2024 07:57 AM   Modules accepted: Orders

## 2024-09-01 ENCOUNTER — Ambulatory Visit: Admitting: Psychology

## 2024-09-01 DIAGNOSIS — F319 Bipolar disorder, unspecified: Secondary | ICD-10-CM

## 2024-09-01 NOTE — Progress Notes (Signed)
 [facility] Counselor/Therapist Progress Note  Patient ID: [name], MRN: [id number]    Date: 09/01/2024  Time Spent: 10:04 am - 10:43 am : 39  Minutes  Treatment Type: Individual Therapy.  Reported Symptoms: Depression, anxiety, and PTSD.   Mental Status Exam: Appearance:  Casual     Behavior: Appropriate  Motor: Normal  Speech/Language:  Normal Rate  Affect: Congruent and Tearful  Mood: depressed  Thought process: normal  Thought content:   WNL  Sensory/Perceptual disturbances:   WNL  Orientation: oriented to person, place, time/date, and situation  Attention: Good  Concentration: Good  Memory: WNL  Fund of knowledge:  Good  Insight:   Good  Judgment:  Good  Impulse Control: Good   Risk Assessment: Danger to Self:  No Self-injurious Behavior: No Danger to Others: No Duty to Warn:no Physical Aggression / Violence:No  Access to Firearms a concern: No  Gang Involvement:No   Subjective:   HAILEE HOLLICK participated in the session, in person in the office with the therapist, and consented to treatment Monesha reviewed the events of the past week. Emili presented with a southern accent, similar to two sessions ago, but not the most recent session. She noted the stressors related having to address finances including closed accounts and late payments due to her hospitalization. Additional stressors include late payments and losing access to her phone. We worked on processing this during the session. She noted reeling from reviewing her medical records fro her most recent psychiatric hospitalization. She noted frustration regarding the various testing she experienced without her verbal approval. She noted that there won't be justice for anything. She was tearful during this time. She noted that there is no way to rectify this situation and having no control. She noted there are some good things that came out of this including being off her medication, being  more active, losing weight, having more energy, and reduction in her agoraphobia. She noted having to re-home a dog that she recently adopted due to aggression towards her other two dogs. We worked on processing this during the session. She noted this process being stressful and noted it being both painful but necessary.  Therapist validated Monigue's feelings and experience during the session. Therapist challenged Aniah's distortions during the session. She noted her insurance changing starting January 1st and that she would need to identify a mental healht provider that will accept her insurance. Therapist offered providing resources for Torey for therapists that accept her insurance. Tiann declined this offer and noted that there are various private practice providers that accept her new insurance. Therapist provided feedback on how to search for providers online, using [employer], and Kiki noted I already know how to do that. A final follow-up was scheduled, a termination session, to review progress, coping, and provide resources for referral for psychiatry and therapy. Therapist provided supportive therapy.    Interventions: CBT   Diagnosis:   Bipolar I disorder (HCC)  Psychiatric Treatment: Yes , Dr. Laurann. Please see chart for medication list. She continues to see his psychiatric provider regularly.   Treatment Plan:  Client Abilities/Strengths Kataleia is intelligent, self-aware, and motivated for change.    Support System: Father in [place]. Friends in area.   Client Treatment Preferences Outpatient Therapy.   Client Statement of Needs Latise would like to increase frequency of socialization, building local support group, engaging in consistent self-care including exercise, addressing difficulty with engagement in activities including health barriers, improving her overall health,  processing past events, processing losses (mother, partner), managing symptoms, balancing responsibilities of  day-to-day life.     Treatment Level Weekly  Symptoms  Anxiety: Feeling anxious, difficulty managing worry, worrying about different things, trouble relaxing, restlessness, irritability, and feeling afraid something awful might happen.    (Status: maintained) Depression: Feeling down, trouble sleeping, lethargy, over eating, difficulty with concentration.    (Status: maintained)  Goals:   Manasvini experiences symptoms of depression, anxiety, and PTSD.   Treatment plan signed and available on s-drive:  No, pending signature.    Target Date: 12/30/24 Frequency: Weekly  Progress: 0 Modality: individual    Therapist will provide referrals for additional resources as appropriate.  Therapist will provide psycho-education regarding Kaitlan's diagnosis and corresponding treatment approaches and interventions. [name], [name] will support the patient's ability to achieve the goals identified. will employ CBT, BA, Problem-solving, Solution Focused, Mindfulness,  coping skills, & other evidenced-based practices will be used to promote progress towards healthy functioning to help manage decrease symptoms associated with her diagnosis.   Reduce overall level, frequency, and intensity of the feelings of depression and anxiety evidenced by decreased overall symptoms from 6 to 7 days/week to 0 to 1 days/week per client report for at least 3 consecutive months. Verbally express understanding of the relationship between feelings of depression, anxiety and their impact on thinking patterns and behaviors. Verbalize an understanding of the role that distorted thinking plays in creating fears, excessive worry, and ruminations.  [name] participated in the creation of the treatment plan)   [name], [name]

## 2024-09-04 ENCOUNTER — Encounter: Payer: Self-pay | Admitting: Internal Medicine

## 2024-09-07 ENCOUNTER — Ambulatory Visit: Admitting: Psychology

## 2024-09-07 DIAGNOSIS — F411 Generalized anxiety disorder: Secondary | ICD-10-CM

## 2024-09-07 DIAGNOSIS — F4310 Post-traumatic stress disorder, unspecified: Secondary | ICD-10-CM

## 2024-09-07 DIAGNOSIS — F319 Bipolar disorder, unspecified: Secondary | ICD-10-CM

## 2024-09-07 NOTE — Progress Notes (Signed)
 [facility] Counselor/Therapist Progress Note  Patient ID: [name], MRN: [id number]    Date: 09/07/2024  Time Spent: 9:04 am - 9:55 am : 51  Minutes  Treatment Type: Individual Therapy.  Reported Symptoms: Depression, anxiety, and PTSD.   Mental Status Exam: Appearance:  Casual     Behavior: Appropriate  Motor: Normal  Speech/Language:  Normal Rate  Affect: Congruent and Tearful  Mood: depressed  Thought process: normal  Thought content:   WNL  Sensory/Perceptual disturbances:   WNL  Orientation: oriented to person, place, time/date, and situation  Attention: Good  Concentration: Good  Memory: WNL  Fund of knowledge:  Good  Insight:   Good  Judgment:  Good  Impulse Control: Good   Risk Assessment: Danger to Self:  No Self-injurious Behavior: No Danger to Others: No Duty to Warn:no Physical Aggression / Violence:No  Access to Firearms a concern: No  Gang Involvement:No   Subjective:   [name] participated in the session, in person in the office with the therapist, and consented to treatment [name] reviewed the events of the past week. This is [name]'s termination session. We worked on reviewing coping skills during the session which includes breathing, walks, listening to music, relaxation, mindfulness, reconnecting and maintaining contact with friends and father, and maintaining self-care. Therapist encouraged [name] to reach out to get connected with new providers to continue her treatment. Additionally, reconnecting with previously enjoyable activities. We reviewed resources for continued treatment including contacting her insurance company for psychological and psychiatric resources. Additional resources include online resources such as psychology today. Therapist encouraged [name] to communicate about her symptoms and needs and to discuss her SAD with her psychiatric provider and seek advice regarding therapy light boxes.Additionally, therapist  encouraged [name] to get established with a PCP, as well. We discussed the importance of maintaining physical health. We reviewed what to do in case of an emergency. [name] noted having close friends including [name] & [name] (roommate) & her father, contacting members of her faith group, contacting her therapist, psychiatrist, and PCP. Additional resources include visiting ED, contacting 911, and contacting behavioral health urgent care. Resources were provided via secure email for reference. Therapist highlighted the importance of maintaining safety. [name] denied any safety concerns during the session. We reviewed how to get records transferred once the appropriate release of information forms are completed and submitted. Therapist highlighted [name]'s engagement and progress in treatment. Therapisthankst highlighted the importance of maintaining safety. [name] was engaged and motivated during the session. She expressed commitment towards the session goals. Therapist provided supportive therapy.   Interventions: CBT   Diagnosis:   Bipolar I disorder (HCC)  GAD (generalized anxiety disorder)  Posttraumatic stress disorder  Psychiatric Treatment: Yes , Dr. Aktar. Please see chart for medication list. She continues to see his psychiatric provider regularly.   Treatment Plan:  Client Abilities/Strengths [name] is intelligent, self-aware, and motivated for change.    Support System: Father in [place]. Friends in area.   Client Treatment Preferences Outpatient Therapy.   Client Statement of Needs [name] would like to increase frequency of socialization, building local support group, engaging in consistent self-care including exercise, addressing difficulty with engagement in activities including health barriers, improving her overall health, processing past events, processing losses (mother, partner), managing symptoms, balancing responsibilities of day-to-day life.     Treatment  Level Weekly  Symptoms  Anxiety: Feeling anxious, difficulty managing worry, worrying about different things, trouble relaxing, restlessness, irritability, and feeling afraid something awful might happen.    (  Status: maintained) Depression: Feeling down, trouble sleeping, lethargy, over eating, difficulty with concentration.    (Status: maintained)  Goals:   [name] experiences symptoms of depression, anxiety, and PTSD.   Treatment plan signed and available on s-drive:  No, pending signature.    Target Date: 12/30/24 Frequency: Weekly  Progress: 0 Modality: individual    Therapist will provide referrals for additional resources as appropriate.  Therapist will provide psycho-education regarding [name]'s diagnosis and corresponding treatment approaches and interventions. Abigail Mullet, [name] will support the patient's ability to achieve the goals identified. will employ CBT, BA, Problem-solving, Solution Focused, Mindfulness,  coping skills, & other evidenced-based practices will be used to promote progress towards healthy functioning to help manage decrease symptoms associated with her diagnosis.   Reduce overall level, frequency, and intensity of the feelings of depression and anxiety evidenced by decreased overall symptoms from 6 to 7 days/week to 0 to 1 days/week per client report for at least 3 consecutive months. Verbally express understanding of the relationship between feelings of depression, anxiety and their impact on thinking patterns and behaviors. Verbalize an understanding of the role that distorted thinking plays in creating fears, excessive worry, and ruminations.  [name] participated in the creation of the treatment plan)   Abigail Mullet, [name]

## 2024-09-11 ENCOUNTER — Other Ambulatory Visit

## 2024-09-11 ENCOUNTER — Ambulatory Visit (INDEPENDENT_AMBULATORY_CARE_PROVIDER_SITE_OTHER): Admitting: Internal Medicine

## 2024-09-11 ENCOUNTER — Encounter: Payer: Self-pay | Admitting: Internal Medicine

## 2024-09-11 VITALS — BP 138/64 | HR 49 | Ht 60.0 in | Wt 150.2 lb

## 2024-09-11 DIAGNOSIS — Z794 Long term (current) use of insulin: Secondary | ICD-10-CM

## 2024-09-11 DIAGNOSIS — E1165 Type 2 diabetes mellitus with hyperglycemia: Secondary | ICD-10-CM

## 2024-09-11 DIAGNOSIS — E039 Hypothyroidism, unspecified: Secondary | ICD-10-CM | POA: Diagnosis not present

## 2024-09-11 DIAGNOSIS — E782 Mixed hyperlipidemia: Secondary | ICD-10-CM

## 2024-09-11 MED ORDER — ACCU-CHEK GUIDE W/DEVICE KIT: PACK | 0 refills | Status: AC

## 2024-09-11 MED ORDER — GLUCOSE BLOOD VI STRP: ORAL_STRIP | 3 refills | Status: AC

## 2024-09-11 MED ORDER — ACCU-CHEK SOFTCLIX LANCETS MISC: 3 refills | Status: AC

## 2024-09-11 NOTE — Progress Notes (Addendum)
 Patient ID: [name], female   DOB: [birth year], 58 y.o.   MRN: [id number]  HPI: [name] is a 58 y.o.-year-old female, returning for f/u for DM2, dx 2013, insulin -dependent, uncontrolled, with complications (diabetic retinopathy) and also hypothyroidism. Last visit 1 month ago.  Interim history: No increased urination, blurry vision, chest pain.   Before last visit, she had an episode of mania with delusions 05/2024.  She was involuntary committed-per review of the chart, she was not sleeping and not taking her medicines.  While in the hospital, she mentions that her sugars were not checked and she was not given diabetic medications.  When they started to be checked eventually, they were in the 300s.  However, sugar started to improve afterwards despite staying off insulin .  She lost approximately 20 pounds before last visit, and since then she gained back 2 pounds. Last visit, she started some exercise - walking.  DM2: Reviewed HbA1c levels: Lab Results  Component Value Date   HGBA1C 9.5 (A) 08/05/2024   HGBA1C 7.7 (H) 06/01/2024   HGBA1C 6.4 (A) 04/03/2024   She was previously on the following regimen but she was off  completely at last visit: - Lantus  12 >> ... 20 >> 25 >> 20 >> 20 units at bedtime >> 24 >> 20 >> 26 units in am - Novolog  15 min before meals 10-12 units before a smaller meal 16-17 units before a regular meal 22-26 units before a high-carb meal - Sliding scale for NovoLog : - 150- 165: + 1 unit  - 166- 180: + 2 units  - 181- 195: + 3 units  - 196- 210: + 4 units  - 211- 225: + 5 units  - 226- 240: + 6 units  - >240 + 7 units  We stopped Cycloset  in 06/2020. She did not start Welchol  2 tabs 2x a day - added 03/2017 - too expensive On Turmeric, alpha-lipoic acid + 660 mcg biotin per day, Garlicin. We stopped Glipizide  when we started NovoLog .  At last visit, I advised her to start: - Lantus  15 units in am (increase by 2-3 units every 2-3 days until  sugars in am are <130) >> 20 units - Novolog  15 min before meals:  >> Actually taking 16 units only before large meals  She has various intolerances to different diabetic medicines: Metformin  ER >> in the past, she had gastric discomfort and severe diarrhea (loss of bowel control) Januvia >> tried for 1-2 months >> left chest pain She was wondering if she could take Invokana, however she had multiple vaginal yeast infections. She Also had nocturia 3-4 x a night.  She had reactive hypoglycemia symptoms while on Quercetin (was taking it for allergies)  She stopped Rybelsus  7 mg >> stopped 2/2 N and D   She checks her sugars more than 4 times a day with her CGM:  Previously:  Previously:  Lowest: LO x2 >> .SABRA. 40 >> 50 >> 63 (at night) >> 50s. Highest: 455 >> .SABRASABRA  300s >> 300s.  She has many food intolerances: - Gluten >> cannot eat bread or pastry.  - raw vegetables, beans, cruciferous vegetables, fruit >> diarrhea.  - No dairy but, cereals  -at night -now off ice cream In the past, no she was taking up to 36 units of NovoLog  for ice cream.  I strongly advised her to stop this in the past. She does not digest fat well after her cholecystectomy in 2011.   Meter: One Touch  Mini  No CKD, last BUN/creatinine was:  Lab Results  Component Value Date   BUN 13 05/31/2024   CREATININE 0.92 05/31/2024   Lab Results  Component Value Date   MICRALBCREAT 43 (H) 05/05/2024  On irbesartan  75 >> 150.  + HL: Lab Results  Component Value Date   CHOL 225 (H) 06/01/2024   HDL 79 06/01/2024   LDLCALC 120 (H) 06/01/2024   LDLDIRECT 205.0 01/24/2016   TRIG 130 06/01/2024   CHOLHDL 2.8 06/01/2024  Previous cholesterol levels were: 325/197/132/154.  She could not tolerate pravastatin  or Livalo  due to muscle aches.  She refused a referral to lipid clinic in the past for PCSK9 inhibitors.  On fluvastatin  XL + CoQ10. On Fish oil now.  I suggested Zetia but she refused due to the possibility of  developing diarrhea on it. I previously suggested Red Yeast Rice try 600 mg twice a day >> did not try it yet. Now seen in the lipid clinic.  She did not start Repatha yet.  - last dilated eye exam was 08/2024: No DR reportedly, prev. + DR. + cataracts. Sees Dr. Charmayne usually.   - + Numbness and tingling in her legs.  She is on the R enantiomer of alpha-lipoic acid.  Last foot exam 08/06/2024. Dr Andrey at [facility].  She also has a history of PCOS- she has seen Dr. Lauraine Gaines at [facility] in the past - note from 08/26/2012: perimenopause, and at that time she was on Yasmin, subsequently changed to Necon. She sees Dr. Rosaline Perches with OB/GYN. She has PTSD, MDD, insomnia, anxiety, asthma, HTN, GERD, status post cholecystectomy, anemia, transaminitis. She also had increased uterine bleeding (on Necon). She sawan acupuncturist. She takes Nettle powder >> helped her with diarrhea, anemia, insomnia. 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 Also, Undecylenic acid and mastic gum, no carbs x 1-2 months.  She has IBS >> better on L- plantarum, S.bulardii - Brevibacillus Laterosporus 20 min before b'fast.   Hypothyroidism  -Diagnosed in 02/2018. We started levothyroxine  03/2018.  Pt is on levothyroxine  100 mcg daily: - in am  - fasting - at least 30 min from b'fast - + butyrate (with Ca) later in the day - no iron - no multivitamins - no PPIs - not on Biotin  Reviewed her TFTs: Lab Results  Component Value Date   TSH 5.380 (H) 05/23/2024   TSH 1.74 04/03/2024   TSH 6.85 (H) 07/26/2023   TSH 1.78 09/27/2022   TSH 6.06 (H) 06/27/2022   TSH 8.33 (H) 02/22/2022   TSH 2.02 02/06/2021   TSH 2.82 07/01/2020   TSH 6.44 (H) 03/22/2020   TSH 2.96 04/01/2019   FREET4 0.96 05/31/2024   FREET4 1.4 04/03/2024   FREET4 0.80 07/26/2023   FREET4 0.88 06/27/2022   FREET4 0.91 02/22/2022   FREET4 0.63 02/06/2021   FREET4 0.78 07/01/2020   FREET4 0.91  03/22/2020   FREET4 0.60 04/01/2019   FREET4 0.84 07/30/2018   T3FREE 2.6 04/04/2018   Her antithyroid antibodies were not elevated: Component     Latest Ref Rng & Units 04/04/2018  Thyroglobulin Ab     < or = 1 IU/mL <1  Thyroperoxidase Ab SerPl-aCnc     <9 IU/mL 2   No FH of thyroid  cancer. No h/o radiation tx to head or neck. No Biotin use. No recent steroids use.   Pt denies: - feeling nodules in neck - hoarseness - dysphagia - choking  She takes vitamin D  5000 units.  ROS: + See HPI  I reviewed pt's medications, allergies, PMH, social hx, family hx, and changes were documented in the history of present illness. Otherwise, unchanged from my initial visit note.  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   PTSD (post-traumatic stress disorder)    Seasonal allergies    Thyroid  disease    Past Surgical History:  Procedure Laterality Date   CHOLECYSTECTOMY     GALLBLADDER SURGERY     Social History   Socioeconomic History   Marital status: Single    Spouse name: Not on file   Number of children: 0   Years of education: 32   Highest education level: Master's degree (e.g., MA, MS, MEng, MEd, MSW, MBA)  Occupational History   Occupation: Long Term Disability    Comment: PTSD  Tobacco Use   Smoking status: Never   Smokeless tobacco: Never  Vaping Use   Vaping status: Never Used  Substance and Sexual Activity   Alcohol use: Yes    Alcohol/week: 1.0 standard drink of alcohol    Types: 1 Standard drinks or equivalent per week    Comment: rare   Drug use: No   Sexual activity: Not Currently    Partners: Male    Birth control/protection: Abstinence     Comment: 1st intercourse- 16, partners more than 5  Other Topics Concern   Not on file  Social History Narrative   Regular exercise: yes, walk   Caffeine use: occasionally tea   Fun: Social group events, walking her dogs, movies   Denies abuse and feels safe where she lives.    Social Drivers of Health   Tobacco Use: Low Risk (08/12/2024)   Patient History    Smoking Tobacco Use: Never    Smokeless Tobacco Use: Never    Passive Exposure: Not on file  Financial Resource Strain: Low Risk (01/08/2022)   Overall Financial Resource Strain (CARDIA)    Difficulty of Paying Living Expenses: Not hard at all  Food Insecurity: No Food Insecurity (05/21/2024)   Epic    Worried About Programme Researcher, Broadcasting/film/video in the Last Year: Never true    Ran Out of Food in the Last Year: Never true  Transportation Needs: No Transportation Needs (05/21/2024)   Epic    Lack of Transportation (Medical): No    Lack of Transportation (Non-Medical): No  Physical Activity: Inactive (01/08/2022)   Exercise Vital Sign    Days of Exercise per Week: 0 days    Minutes of Exercise per Session: 0 min  Stress: No Stress Concern Present (01/08/2022)   [name] [employer] - Occupational Stress Questionnaire    Feeling of Stress : Not at all  Social Connections: Socially Isolated (01/08/2022)   Social Connection and Isolation Panel    Frequency of Communication with Friends and Family: Twice a week    Frequency of Social Gatherings with Friends and Family: More than three times a week    Attends Religious Services: Never    Database Administrator or Organizations: No    Attends [employer]  or Organization Meetings: Never    Marital Status: Never married  Intimate Partner Violence: Not At Risk (05/21/2024)   Epic    Fear of Current or Ex-Partner: No    Emotionally Abused: No    Physically Abused: No    Sexually Abused: No  Depression (PHQ2-9): Low Risk (08/17/2022)   Depression (PHQ2-9)    PHQ-2 Score: 4  Alcohol  Screen: Low Risk (05/21/2024)   Alcohol Screen    Last Alcohol Screening Score (AUDIT): 0  Housing: Low Risk (05/21/2024)   Epic    Unable to Pay for Housing in the Last Year: No    Number of Times Moved in the Last Year: 0    Homeless in the Last Year: No  Utilities: Not At Risk (05/21/2024)   Epic    Threatened with loss of utilities: No  Health Literacy: Not on file   Current Outpatient Medications on File Prior to Visit  Medication Sig Dispense Refill   albuterol  (VENTOLIN  HFA) 108 (90 Base) MCG/ACT inhaler Inhale 2 puffs into the lungs every 4 (four) hours as needed for wheezing or shortness of breath. 18 g 5   atenolol  (TENORMIN ) 100 MG tablet Take 1 tablet (100 mg total) by mouth 2 (two) times daily. 180 tablet 3   Blood Glucose Monitoring Suppl (ONE TOUCH ULTRA MINI) w/Device KIT Use to check blood sugar 2 times a day. 1 kit 0   Blood Glucose Monitoring Suppl (ONETOUCH VERIO) w/Device KIT Use as instructed to check blood sugar 4X daily 1 kit 0   Continuous Glucose Receiver (FREESTYLE LIBRE 3 READER) DEVI Use to monitor glucose continuously 1 each 0   Continuous Glucose Sensor (FREESTYLE LIBRE 3 PLUS SENSOR) MISC Inject 1 Device into the skin continuous. Change every 15 days 6 each 3   Digestive Enzymes (ENZYME DIGEST PO) Take by mouth.     estradiol  (ESTRACE ) 0.01 % CREA vaginal cream Place 1 Applicatorful vaginally 3 (three) times a week. Do not use applicator. Rub a dime size amount into the vagina nightly for 2 months then use three to four times a week thereafter 42.5 g 0   fexofenadine  (ALLEGRA ) 60 MG tablet Take 120 mg by mouth 2 (two) times daily.     glucose blood (ONETOUCH VERIO) test strip Use as instructed to check blood sugar 4X daily 300 each 2   Insulin  Pen Needle (BD PEN NEEDLE NANO 2ND GEN) 32G X 4 MM MISC USE 4 TIMES DAILY 400 each 3   irbesartan  (AVAPRO ) 150 MG tablet Take 1 tablet (150 mg total) by mouth daily. Patient must follow up with Dr. Lavona for more refills.  90 tablet 3   LANTUS  SOLOSTAR 100 UNIT/ML Solostar Pen Inject 26 Units into the skin daily. 45 mL 1   levothyroxine  (SYNTHROID ) 100 MCG tablet TAKE 1 TABLET(100 MCG) BY MOUTH DAILY 90 tablet 3   methocarbamol  (ROBAXIN ) 500 MG tablet Take 1 tablet (500 mg total) by mouth every 8 (eight) hours as needed for muscle spasms. 180 tablet 0   NOVOLOG  FLEXPEN 100 UNIT/ML FlexPen Inject 10-30 Units into the skin 3 (three) times daily with meals. 90 mL 2   Olopatadine HCl (PATADAY OP) Apply to eye.     No current facility-administered medications on file prior to visit.   Allergies  Allergen Reactions   Latex Itching and Swelling   Fluticasone      Nose bleeds   Metformin  And Related     GI side effects   Pravastatin   myalgias   Family History  Problem Relation Age of Onset   Bipolar disorder Mother    Hypertension Mother    Diabetes Mother    Heart disease Mother        CAD, pacemaker   Alzheimer's disease Father    Hypertension Father    Alzheimer's disease Maternal Grandmother    Heart disease Paternal Grandfather    PE: BP 138/64   Pulse (!) 49   Ht 5' (1.524 m)   Wt 150 lb 3.2 oz (68.1 kg)   LMP 12/22/2013   SpO2 98%   BMI 29.33 kg/m    Wt Readings from Last 3 Encounters:  09/11/24 150 lb 3.2 oz (68.1 kg)  08/12/24 153 lb 6.4 oz (69.6 kg)  08/05/24 148 lb 12.8 oz (67.5 kg)   Constitutional: Slightly overweight, in NAD Eyes:  EOMI, no exophthalmos ENT: no neck masses, no cervical lymphadenopathy Cardiovascular: RRR, No MRG Respiratory: CTA B Musculoskeletal: no deformities Skin: No rashes Neurological: + tremor with outstretched hands  ASSESSMENT: 1. DM2, insulin -dependent, uncontrolled, with complications - DR  2. HL - Very high LDL - high HDL - slightly high TG  3.  Hypothyroidism  PLAN:  1. DM2 -Patient with insulin -dependent type 2 diabetes, who returns at last visit after an admission to [facility], during which her psychotropic medications  were stopped.  Subsequently, she lost 30 pounds.  We did discuss that this could have been related to uncontrolled blood sugars, as she was off her insulin  and not checking blood sugars and an HbA1c returned very high, at 9.5%.  We discussed about starting back on Lantus  and NovoLog  and also a CGM.  She was interested in the [facility] sensor. CGM interpretation: -At today's visit, we reviewed her CGM downloads: It appears that 75% of values are in target range (goal >70%), while 21% are higher than 180 (goal <25%), and 4% are lower than 70 (goal <4%).  The calculated average blood sugar is 145.  The projected HbA1c for the next 3 months (GMI) is 6.8%. -Reviewing the CGM trends, sugars appear to have improved, but she has higher blood sugars after her meals, some of them quite high, in the 300s, after candy.  Upon questioning, she forgot about the instructions of taking 4 to 6 units of NovoLog  before meals and she started taking correctly 16 units, with subsequent low blood sugars after smaller meals so she is currently using this dose only for very high carb meals.  Due to the increase in blood sugars after meals, I advised her to try to take a lower NovoLog  dose before the rest of the meals and increase the amount as needed before larger meals.  She did increase the Lantus  slightly since last visit and we will continue with this dose. - she does not have a glucometer at home as she lost this so I sent a new prescription to her pharmacy and advised her when to use it-to verify low blood sugars correct by the sensor and also whenever correcting a low or high blood sugar. - I advised her to:  Patient Instructions  Please continue: - Lantus  20 units in am   Change: - Novolog  15 min before meals:  4 units before a smaller meal 5 units before a regular meal 6 units before a high-carb meal   Please continue: - Levothyroxine  100 mg daily  Take the thyroid  hormone every day, with water , at least 30 minutes  before breakfast, separated by at least  4 hours from: - acid reflux medications - calcium  - iron - multivitamins   Please return in 2-3 months.  - advised to check sugars at different times of the day - 4x a day, rotating check times - advised for yearly eye exams >> she is UTD -At last check her ACR was slightly elevated, at 43, we will recheck this today.  3 - return to clinic in 2-3 months, however, she mentions that she is not sure if she will be able to come back after changing her insurance in the next calendar year  2. HL - Lipid panel was reviewed: She previously had very high LDL, which improved at last check: Lab Results  Component Value Date   CHOL 225 (H) 06/01/2024   HDL 79 06/01/2024   LDLCALC 120 (H) 06/01/2024   LDLDIRECT 205.0 01/24/2016   TRIG 130 06/01/2024   CHOLHDL 2.8 06/01/2024  -She previous she could not tolerate Livalo . I Advised her to try Zetia but she declined that she read that this could cause diarrhea. -She declined a referral to the lipid clinic for PCSK9 inhibitors -She continues to take fluvastatin  XL inconsistently -She used to eat ice cream and other sweets -She started fish oil 2400 mg daily -I suggested red yeast rice but she did not start this -She was then seen by Dr. Lavona and referred to [facility].D. for starting Repatha.  -The 10-year ASCVD risk score (Arnett DK, et al., 2019) is: 4.9%  - Further management per cardiology  3.   Hypothyroidism - latest thyroid  labs reviewed with pt. >> sh was elevated Lab Results  Component Value Date   TSH 5.380 (H) 05/23/2024  - she continues on LT4 100 mcg daily - we did not change the dose at last visit as she did not mention missing LT4 doses previously.  She previously estimated missing 25% of the doses and we discussed about the importance of taking it every day, even if she was waking up late. - pt feels good on this dose. - we discussed about taking the thyroid  hormone every day, with water ,  >30 minutes before breakfast, separated by >4 hours from acid reflux medications, calcium , iron, multivitamins. Pt. is taking it correctly. - will check thyroid  tests today: TSH and fT4 - If labs are abnormal, she will need to return for repeat TFTs in 1.5 months  Orders Placed This Encounter  Procedures   Microalbumin / creatinine urine ratio   TSH   T4, free   Component     Latest Ref Rng 09/11/2024  TSH     0.40 - 4.50 mIU/L 0.44   T4,Free(Direct)     0.8 - 1.8 ng/dL 1.8   Creatinine, Urine     20 - 275 mg/dL 46   Microalb, Ur     mg/dL 0.8   MICROALB/CREAT RATIO     <30 mg/g creat 17   Labs are normal.  Lela Fendt, MD PhD [facility]

## 2024-09-11 NOTE — Patient Instructions (Addendum)
 Please continue: - Lantus  20 units in am   Change: - Novolog  15 min before meals:  4 units before a smaller meal 5 units before a regular meal 6 units before a high-carb meal   Please continue: - Levothyroxine  100 mg daily  Take the thyroid  hormone every day, with water , at least 30 minutes before breakfast, separated by at least 4 hours from: - acid reflux medications - calcium  - iron - multivitamins   Please return in 2-3 months.

## 2024-09-12 LAB — MICROALBUMIN / CREATININE URINE RATIO
Creatinine, Urine: 46 mg/dL (ref 20–275)
Microalb Creat Ratio: 17 mg/g{creat}
Microalb, Ur: 0.8 mg/dL

## 2024-09-12 LAB — T4, FREE: Free T4: 1.8 ng/dL (ref 0.8–1.8)

## 2024-09-12 LAB — TSH: TSH: 0.44 m[IU]/L (ref 0.40–4.50)

## 2024-09-14 ENCOUNTER — Ambulatory Visit: Payer: Self-pay | Admitting: Internal Medicine

## 2024-09-14 MED ORDER — LEVOTHYROXINE SODIUM 100 MCG PO TABS: ORAL_TABLET | ORAL | 3 refills | Status: AC

## 2024-09-14 NOTE — Addendum Note (Signed)
 Addended by: TRIXIE FILE on: 09/14/2024 10:24 AM   Modules accepted: Orders

## 2024-09-21 ENCOUNTER — Ambulatory Visit: Admitting: Psychology

## 2024-09-22 ENCOUNTER — Encounter: Admitting: Obstetrics and Gynecology

## 2024-09-23 ENCOUNTER — Ambulatory Visit: Admitting: Psychology

## 2024-09-24 ENCOUNTER — Ambulatory Visit: Admitting: Obstetrics and Gynecology
